# Patient Record
Sex: Male | Born: 1955 | Race: Black or African American | Hispanic: No | Smoking: Current every day smoker
Health system: Southern US, Community
[De-identification: ages and names within clinical notes are randomized; demographics above are authoritative.]

## PROBLEM LIST (undated history)

## (undated) DIAGNOSIS — Z86718 Personal history of other venous thrombosis and embolism: Secondary | ICD-10-CM

## (undated) DIAGNOSIS — C679 Malignant neoplasm of bladder, unspecified: Secondary | ICD-10-CM

## (undated) DIAGNOSIS — I219 Acute myocardial infarction, unspecified: Secondary | ICD-10-CM

## (undated) DIAGNOSIS — I2510 Atherosclerotic heart disease of native coronary artery without angina pectoris: Secondary | ICD-10-CM

## (undated) DIAGNOSIS — K631 Perforation of intestine (nontraumatic): Secondary | ICD-10-CM

## (undated) DIAGNOSIS — N182 Chronic kidney disease, stage 2 (mild): Secondary | ICD-10-CM

## (undated) DIAGNOSIS — E119 Type 2 diabetes mellitus without complications: Secondary | ICD-10-CM

## (undated) DIAGNOSIS — D509 Iron deficiency anemia, unspecified: Secondary | ICD-10-CM

## (undated) DIAGNOSIS — F1910 Other psychoactive substance abuse, uncomplicated: Secondary | ICD-10-CM

## (undated) DIAGNOSIS — I5020 Unspecified systolic (congestive) heart failure: Secondary | ICD-10-CM

## (undated) DIAGNOSIS — I255 Ischemic cardiomyopathy: Secondary | ICD-10-CM

## (undated) DIAGNOSIS — E785 Hyperlipidemia, unspecified: Secondary | ICD-10-CM

## (undated) DIAGNOSIS — I509 Heart failure, unspecified: Secondary | ICD-10-CM

## (undated) DIAGNOSIS — I10 Essential (primary) hypertension: Secondary | ICD-10-CM

## (undated) DIAGNOSIS — I2699 Other pulmonary embolism without acute cor pulmonale: Secondary | ICD-10-CM

## (undated) HISTORY — DX: Personal history of other venous thrombosis and embolism: Z86.718

## (undated) HISTORY — DX: Hyperlipidemia, unspecified: E78.5

## (undated) HISTORY — DX: Type 2 diabetes mellitus without complications: E11.9

## (undated) HISTORY — DX: Iron deficiency anemia, unspecified: D50.9

## (undated) HISTORY — DX: Chronic kidney disease, stage 2 (mild): N18.2

## (undated) HISTORY — DX: Atherosclerotic heart disease of native coronary artery without angina pectoris: I25.10

## (undated) HISTORY — DX: Malignant neoplasm of bladder, unspecified: C67.9

## (undated) HISTORY — PX: BACK SURGERY: SHX140

## (undated) HISTORY — DX: Heart failure, unspecified: I50.9

## (undated) HISTORY — DX: Perforation of intestine (nontraumatic): K63.1

## (undated) HISTORY — DX: Ischemic cardiomyopathy: I25.5

## (undated) HISTORY — DX: Unspecified systolic (congestive) heart failure: I50.20

## (undated) HISTORY — PX: OTHER SURGICAL HISTORY: SHX169

---

## 2004-05-15 ENCOUNTER — Ambulatory Visit (HOSPITAL_COMMUNITY): Admission: RE | Admit: 2004-05-15 | Discharge: 2004-05-15 | Payer: Self-pay | Admitting: Neurosurgery

## 2015-09-05 ENCOUNTER — Encounter: Payer: Self-pay | Admitting: Emergency Medicine

## 2015-09-05 ENCOUNTER — Encounter
Admission: EM | Disposition: A | Discharge status: Home / Self Care | Payer: Self-pay | Source: Home / Self Care | Attending: Internal Medicine

## 2015-09-05 ENCOUNTER — Emergency Department: Payer: Self-pay

## 2015-09-05 ENCOUNTER — Encounter: Payer: Self-pay | Admitting: Certified Registered Nurse Anesthetist

## 2015-09-05 ENCOUNTER — Inpatient Hospital Stay (HOSPITAL_COMMUNITY)
Admit: 2015-09-05 | Discharge: 2015-09-05 | Disposition: A | Discharge status: Home / Self Care | Payer: Self-pay | Attending: Internal Medicine | Admitting: Internal Medicine

## 2015-09-05 ENCOUNTER — Inpatient Hospital Stay
Admission: EM | Admit: 2015-09-05 | Discharge: 2015-09-08 | DRG: 252 | Disposition: A | Discharge status: Home / Self Care | Payer: Self-pay | Attending: Specialist | Admitting: Specialist

## 2015-09-05 DIAGNOSIS — I214 Non-ST elevation (NSTEMI) myocardial infarction: Principal | ICD-10-CM | POA: Diagnosis present

## 2015-09-05 DIAGNOSIS — R634 Abnormal weight loss: Secondary | ICD-10-CM | POA: Insufficient documentation

## 2015-09-05 DIAGNOSIS — E1159 Type 2 diabetes mellitus with other circulatory complications: Secondary | ICD-10-CM | POA: Diagnosis present

## 2015-09-05 DIAGNOSIS — Z8249 Family history of ischemic heart disease and other diseases of the circulatory system: Secondary | ICD-10-CM

## 2015-09-05 DIAGNOSIS — E1165 Type 2 diabetes mellitus with hyperglycemia: Secondary | ICD-10-CM | POA: Diagnosis present

## 2015-09-05 DIAGNOSIS — I209 Angina pectoris, unspecified: Secondary | ICD-10-CM

## 2015-09-05 DIAGNOSIS — I82411 Acute embolism and thrombosis of right femoral vein: Secondary | ICD-10-CM | POA: Diagnosis present

## 2015-09-05 DIAGNOSIS — I259 Chronic ischemic heart disease, unspecified: Secondary | ICD-10-CM | POA: Diagnosis present

## 2015-09-05 DIAGNOSIS — I10 Essential (primary) hypertension: Secondary | ICD-10-CM | POA: Diagnosis present

## 2015-09-05 DIAGNOSIS — D509 Iron deficiency anemia, unspecified: Secondary | ICD-10-CM

## 2015-09-05 DIAGNOSIS — F17210 Nicotine dependence, cigarettes, uncomplicated: Secondary | ICD-10-CM | POA: Diagnosis present

## 2015-09-05 DIAGNOSIS — D494 Neoplasm of unspecified behavior of bladder: Secondary | ICD-10-CM | POA: Diagnosis present

## 2015-09-05 DIAGNOSIS — I2699 Other pulmonary embolism without acute cor pulmonale: Secondary | ICD-10-CM | POA: Diagnosis present

## 2015-09-05 DIAGNOSIS — IMO0002 Reserved for concepts with insufficient information to code with codable children: Secondary | ICD-10-CM | POA: Insufficient documentation

## 2015-09-05 DIAGNOSIS — E785 Hyperlipidemia, unspecified: Secondary | ICD-10-CM | POA: Diagnosis present

## 2015-09-05 DIAGNOSIS — R3129 Other microscopic hematuria: Secondary | ICD-10-CM | POA: Diagnosis present

## 2015-09-05 DIAGNOSIS — I8290 Acute embolism and thrombosis of unspecified vein: Secondary | ICD-10-CM

## 2015-09-05 DIAGNOSIS — D500 Iron deficiency anemia secondary to blood loss (chronic): Secondary | ICD-10-CM | POA: Diagnosis present

## 2015-09-05 DIAGNOSIS — R079 Chest pain, unspecified: Secondary | ICD-10-CM

## 2015-09-05 DIAGNOSIS — D649 Anemia, unspecified: Secondary | ICD-10-CM | POA: Insufficient documentation

## 2015-09-05 HISTORY — PX: CARDIAC CATHETERIZATION: SHX172

## 2015-09-05 HISTORY — DX: Other psychoactive substance abuse, uncomplicated: F19.10

## 2015-09-05 HISTORY — DX: Essential (primary) hypertension: I10

## 2015-09-05 HISTORY — DX: Type 2 diabetes mellitus without complications: E11.9

## 2015-09-05 LAB — CBC
HCT: 17.4 % — ABNORMAL LOW (ref 40.0–52.0)
HCT: 27.6 % — ABNORMAL LOW (ref 40.0–52.0)
HEMOGLOBIN: 8.1 g/dL — AB (ref 13.0–18.0)
Hemoglobin: 4.9 g/dL — CL (ref 13.0–18.0)
MCH: 19.1 pg — ABNORMAL LOW (ref 26.0–34.0)
MCH: 20.9 pg — AB (ref 26.0–34.0)
MCHC: 28.3 g/dL — ABNORMAL LOW (ref 32.0–36.0)
MCHC: 29.4 g/dL — AB (ref 32.0–36.0)
MCV: 67.5 fL — ABNORMAL LOW (ref 80.0–100.0)
MCV: 71 fL — ABNORMAL LOW (ref 80.0–100.0)
PLATELETS: 294 10*3/uL (ref 150–440)
Platelets: 261 10*3/uL (ref 150–440)
RBC: 2.58 MIL/uL — ABNORMAL LOW (ref 4.40–5.90)
RBC: 3.88 MIL/uL — AB (ref 4.40–5.90)
RDW: 25 % — ABNORMAL HIGH (ref 11.5–14.5)
RDW: 25.6 % — ABNORMAL HIGH (ref 11.5–14.5)
WBC: 13.3 10*3/uL — ABNORMAL HIGH (ref 3.8–10.6)
WBC: 17.3 10*3/uL — ABNORMAL HIGH (ref 3.8–10.6)

## 2015-09-05 LAB — URINALYSIS COMPLETE WITH MICROSCOPIC (ARMC ONLY)
BACTERIA UA: NONE SEEN
Bilirubin Urine: NEGATIVE
Glucose, UA: NEGATIVE mg/dL
Ketones, ur: NEGATIVE mg/dL
LEUKOCYTES UA: NEGATIVE
Nitrite: POSITIVE — AB
PH: 9 — AB (ref 5.0–8.0)
PROTEIN: 100 mg/dL — AB
SQUAMOUS EPITHELIAL / LPF: NONE SEEN
Specific Gravity, Urine: 1.031 — ABNORMAL HIGH (ref 1.005–1.030)

## 2015-09-05 LAB — IRON AND TIBC
IRON: 78 ug/dL (ref 45–182)
Iron: 48 ug/dL (ref 45–182)
SATURATION RATIOS: 9 % — AB (ref 17.9–39.5)
Saturation Ratios: 13 % — ABNORMAL LOW (ref 17.9–39.5)
TIBC: 511 ug/dL — ABNORMAL HIGH (ref 250–450)
TIBC: 581 ug/dL — AB (ref 250–450)
UIBC: 463 ug/dL
UIBC: 503 ug/dL

## 2015-09-05 LAB — PROTIME-INR
INR: 1.3
Prothrombin Time: 16.3 seconds — ABNORMAL HIGH (ref 11.4–15.0)

## 2015-09-05 LAB — CBC WITH DIFFERENTIAL/PLATELET
Basophils Absolute: 0.1 10*3/uL (ref 0–0.1)
EOS ABS: 0.1 10*3/uL (ref 0–0.7)
HCT: 23.5 % — ABNORMAL LOW (ref 40.0–52.0)
Hemoglobin: 6.9 g/dL — ABNORMAL LOW (ref 13.0–18.0)
Lymphocytes Relative: 9 %
Lymphs Abs: 1.2 10*3/uL (ref 1.0–3.6)
MCH: 20.7 pg — ABNORMAL LOW (ref 26.0–34.0)
MCHC: 29.3 g/dL — AB (ref 32.0–36.0)
MCV: 70.8 fL — ABNORMAL LOW (ref 80.0–100.0)
MONO ABS: 1.7 10*3/uL — AB (ref 0.2–1.0)
Neutro Abs: 10.7 10*3/uL — ABNORMAL HIGH (ref 1.4–6.5)
Neutrophils Relative %: 79 %
Platelets: 253 10*3/uL (ref 150–440)
RBC: 3.33 MIL/uL — ABNORMAL LOW (ref 4.40–5.90)
RDW: 26.9 % — AB (ref 11.5–14.5)
WBC: 13.7 10*3/uL — ABNORMAL HIGH (ref 3.8–10.6)

## 2015-09-05 LAB — TROPONIN I
Troponin I: 20.13 ng/mL — ABNORMAL HIGH (ref <0.031)
Troponin I: 20.31 ng/mL — ABNORMAL HIGH (ref <0.031)
Troponin I: 22.51 ng/mL — ABNORMAL HIGH (ref <0.031)
Troponin I: 16.1 ng/mL — ABNORMAL HIGH (ref <0.031)

## 2015-09-05 LAB — GLUCOSE, CAPILLARY: GLUCOSE-CAPILLARY: 169 mg/dL — AB (ref 65–99)

## 2015-09-05 LAB — RETICULOCYTES
RBC.: 3.33 MIL/uL — AB (ref 4.40–5.90)
RETIC CT PCT: 2.4 % (ref 0.4–3.1)
Retic Count, Absolute: 79.9 10*3/uL (ref 19.0–183.0)

## 2015-09-05 LAB — LIPID PANEL
Cholesterol: 107 mg/dL (ref 0–200)
HDL: 25 mg/dL — ABNORMAL LOW (ref >40)
LDL CALC: 60 mg/dL (ref 0–99)
Total CHOL/HDL Ratio: 4.3 RATIO
Triglycerides: 111 mg/dL (ref <150)
VLDL: 22 mg/dL (ref 0–40)

## 2015-09-05 LAB — COMPREHENSIVE METABOLIC PANEL
ALT: 24 U/L (ref 17–63)
AST: 176 U/L — ABNORMAL HIGH (ref 15–41)
Albumin: 3.4 g/dL — ABNORMAL LOW (ref 3.5–5.0)
Alkaline Phosphatase: 70 U/L (ref 38–126)
Anion gap: 9 (ref 5–15)
BUN: 14 mg/dL (ref 6–20)
CO2: 23 mmol/L (ref 22–32)
Calcium: 8.4 mg/dL — ABNORMAL LOW (ref 8.9–10.3)
Chloride: 102 mmol/L (ref 101–111)
Creatinine, Ser: 1.13 mg/dL (ref 0.61–1.24)
GFR calc Af Amer: >60 mL/min (ref >60)
GFR calc non Af Amer: >60 mL/min (ref >60)
Glucose, Bld: 193 mg/dL — ABNORMAL HIGH (ref 65–99)
Potassium: 3.8 mmol/L (ref 3.5–5.1)
Sodium: 134 mmol/L — ABNORMAL LOW (ref 135–145)
Total Bilirubin: 1 mg/dL (ref 0.3–1.2)
Total Protein: 7.3 g/dL (ref 6.5–8.1)

## 2015-09-05 LAB — LACTATE DEHYDROGENASE: LDH: 667 U/L — ABNORMAL HIGH (ref 98–192)

## 2015-09-05 LAB — ABO/RH: ABO/RH(D): O POS

## 2015-09-05 LAB — HEPARIN LEVEL (UNFRACTIONATED)

## 2015-09-05 LAB — APTT: aPTT: 33 seconds (ref 24–36)

## 2015-09-05 LAB — FERRITIN: Ferritin: 5 ng/mL — ABNORMAL LOW (ref 24–336)

## 2015-09-05 LAB — VITAMIN B12: VITAMIN B 12: 2281 pg/mL — AB (ref 180–914)

## 2015-09-05 LAB — FOLATE: Folate: 14.9 ng/mL (ref >5.9)

## 2015-09-05 LAB — BRAIN NATRIURETIC PEPTIDE: B Natriuretic Peptide: 958 pg/mL — ABNORMAL HIGH (ref 0.0–100.0)

## 2015-09-05 LAB — PREPARE RBC (CROSSMATCH)

## 2015-09-05 SURGERY — LEFT HEART CATH AND CORONARY ANGIOGRAPHY
Anesthesia: Moderate Sedation

## 2015-09-05 MED ORDER — SODIUM CHLORIDE 0.9% FLUSH
3.0000 mL | Freq: Two times a day (BID) | INTRAVENOUS | Status: DC
  Administered 2015-09-05 – 2015-09-08 (×5): 3 mL via INTRAVENOUS

## 2015-09-05 MED ORDER — SODIUM CHLORIDE 0.9 % IV SOLN
10.0000 mL/h | Freq: Once | INTRAVENOUS | Status: AC
  Administered 2015-09-05: 10 mL/h via INTRAVENOUS

## 2015-09-05 MED ORDER — SODIUM CHLORIDE 0.9 % IV SOLN
INTRAVENOUS | Status: DC
  Administered 2015-09-06: 07:00:00 via INTRAVENOUS

## 2015-09-05 MED ORDER — ACETAMINOPHEN 325 MG PO TABS: 650.0000 mg | ORAL_TABLET | ORAL | Status: DC | PRN

## 2015-09-05 MED ORDER — HEPARIN (PORCINE) IN NACL 100-0.45 UNIT/ML-% IJ SOLN
1200.0000 [IU]/h | INTRAMUSCULAR | Status: DC
  Administered 2015-09-05: 1200 [IU]/h via INTRAVENOUS
  Filled 2015-09-05: qty 250

## 2015-09-05 MED ORDER — HEPARIN (PORCINE) IN NACL 100-0.45 UNIT/ML-% IJ SOLN
12.0000 [IU]/kg/h | Freq: Once | INTRAMUSCULAR | Status: DC
  Filled 2015-09-05: qty 250

## 2015-09-05 MED ORDER — CARVEDILOL 3.125 MG PO TABS
3.1250 mg | ORAL_TABLET | Freq: Two times a day (BID) | ORAL | Status: DC
  Administered 2015-09-05 – 2015-09-08 (×7): 3.125 mg via ORAL
  Filled 2015-09-05 (×7): qty 1

## 2015-09-05 MED ORDER — ACETAMINOPHEN 650 MG RE SUPP: 650.0000 mg | Freq: Four times a day (QID) | RECTAL | Status: DC | PRN

## 2015-09-05 MED ORDER — MIDAZOLAM HCL 2 MG/2ML IJ SOLN
INTRAMUSCULAR | Status: DC | PRN
  Administered 2015-09-05 (×2): 1 mg via INTRAVENOUS

## 2015-09-05 MED ORDER — HEPARIN SODIUM (PORCINE) 5000 UNIT/ML IJ SOLN: 4000.0000 [IU] | Freq: Once | INTRAMUSCULAR | Status: DC

## 2015-09-05 MED ORDER — NITROGLYCERIN 0.4 MG SL SUBL
0.4000 mg | SUBLINGUAL_TABLET | Freq: Once | SUBLINGUAL | Status: AC
  Administered 2015-09-05: 0.4 mg via SUBLINGUAL
  Filled 2015-09-05: qty 1

## 2015-09-05 MED ORDER — MORPHINE SULFATE (PF) 2 MG/ML IV SOLN
1.0000 mg | INTRAVENOUS | Status: DC | PRN
  Administered 2015-09-05: 1 mg via INTRAVENOUS
  Filled 2015-09-05: qty 1

## 2015-09-05 MED ORDER — SODIUM CHLORIDE 0.9 % WEIGHT BASED INFUSION
3.0000 mL/kg/h | INTRAVENOUS | Status: AC
  Administered 2015-09-05: 3 mL/kg/h via INTRAVENOUS

## 2015-09-05 MED ORDER — MIDAZOLAM HCL 2 MG/2ML IJ SOLN
INTRAMUSCULAR | Status: AC
  Filled 2015-09-05: qty 2

## 2015-09-05 MED ORDER — SODIUM CHLORIDE 0.9 % IV SOLN: 250.0000 mL | INTRAVENOUS | Status: DC | PRN

## 2015-09-05 MED ORDER — HEPARIN (PORCINE) IN NACL 2-0.9 UNIT/ML-% IJ SOLN
INTRAMUSCULAR | Status: AC
Start: 2015-09-05 — End: 2015-09-05
  Filled 2015-09-05: qty 1000

## 2015-09-05 MED ORDER — ASPIRIN 81 MG PO CHEW
324.0000 mg | CHEWABLE_TABLET | Freq: Once | ORAL | Status: AC
  Administered 2015-09-05: 324 mg via ORAL
  Filled 2015-09-05: qty 4

## 2015-09-05 MED ORDER — SODIUM CHLORIDE 0.9% FLUSH: 3.0000 mL | Freq: Two times a day (BID) | INTRAVENOUS | Status: DC

## 2015-09-05 MED ORDER — ACETAMINOPHEN 325 MG PO TABS: 650.0000 mg | ORAL_TABLET | Freq: Four times a day (QID) | ORAL | Status: DC | PRN

## 2015-09-05 MED ORDER — ASPIRIN 81 MG PO CHEW: 81.0000 mg | CHEWABLE_TABLET | ORAL | Status: DC

## 2015-09-05 MED ORDER — FENTANYL CITRATE (PF) 100 MCG/2ML IJ SOLN
INTRAMUSCULAR | Status: DC | PRN
  Administered 2015-09-05: 25 ug via INTRAVENOUS

## 2015-09-05 MED ORDER — SODIUM CHLORIDE 0.9 % WEIGHT BASED INFUSION: 1.0000 mL/kg/h | INTRAVENOUS | Status: DC

## 2015-09-05 MED ORDER — ATORVASTATIN CALCIUM 20 MG PO TABS
40.0000 mg | ORAL_TABLET | Freq: Every day | ORAL | Status: DC
  Administered 2015-09-05 – 2015-09-08 (×4): 40 mg via ORAL
  Filled 2015-09-05 (×2): qty 2
  Filled 2015-09-05 (×2): qty 1
  Filled 2015-09-05: qty 2

## 2015-09-05 MED ORDER — ASPIRIN 81 MG PO CHEW
81.0000 mg | CHEWABLE_TABLET | Freq: Every day | ORAL | Status: DC
  Administered 2015-09-05 – 2015-09-06 (×2): 81 mg via ORAL
  Filled 2015-09-05 (×2): qty 1

## 2015-09-05 MED ORDER — IOHEXOL 300 MG/ML  SOLN
INTRAMUSCULAR | Status: DC | PRN
  Administered 2015-09-05: 120 mL via INTRA_ARTERIAL

## 2015-09-05 MED ORDER — SODIUM CHLORIDE 0.9 % WEIGHT BASED INFUSION: 3.0000 mL/kg/h | INTRAVENOUS | Status: DC

## 2015-09-05 MED ORDER — TICAGRELOR 90 MG PO TABS
90.0000 mg | ORAL_TABLET | Freq: Two times a day (BID) | ORAL | Status: DC
  Administered 2015-09-05 – 2015-09-06 (×2): 90 mg via ORAL
  Filled 2015-09-05 (×2): qty 1

## 2015-09-05 MED ORDER — ONDANSETRON HCL 4 MG/2ML IJ SOLN: 4.0000 mg | Freq: Four times a day (QID) | INTRAMUSCULAR | Status: DC | PRN

## 2015-09-05 MED ORDER — FENTANYL CITRATE (PF) 100 MCG/2ML IJ SOLN
INTRAMUSCULAR | Status: AC
  Filled 2015-09-05: qty 2

## 2015-09-05 MED ORDER — SODIUM CHLORIDE 0.9% FLUSH: 3.0000 mL | INTRAVENOUS | Status: DC | PRN

## 2015-09-05 SURGICAL SUPPLY — 11 items
CATH INFINITI 5FR ANG PIGTAIL (CATHETERS) ×2 IMPLANT
CATH INFINITI 5FR JL4 (CATHETERS) ×2 IMPLANT
CATH INFINITI JR4 5F (CATHETERS) ×2 IMPLANT
DEVICE CLOSURE MYNXGRIP 5F (Vascular Products) ×1 IMPLANT
KIT MANI 3VAL PERCEP (MISCELLANEOUS) ×2 IMPLANT
NDL PERC 18GX7CM (NEEDLE) ×1 IMPLANT
NEEDLE PERC 18GX7CM (NEEDLE) ×2 IMPLANT
NEEDLE SMART 18G ACCESS (NEEDLE) IMPLANT
PACK CARDIAC CATH (CUSTOM PROCEDURE TRAY) ×2 IMPLANT
SHEATH AVANTI 5FR X 11CM (SHEATH) ×2 IMPLANT
WIRE EMERALD 3MM-J .035X150CM (WIRE) ×2 IMPLANT

## 2015-09-05 NOTE — Progress Notes (Signed)
ANTICOAGULATION CONSULT NOTE - Initial Consult  Pharmacy Consult for heparin drip Indication: chest pain/ACS  No Known Allergies  Patient Measurements: Height: 6\' 2"  (188 cm) Weight: 220 lb 10.9 oz (100.1 kg) IBW/kg (Calculated) : 82.2 Heparin Dosing Weight: 100.1 kg  Vital Signs: Temp: 99 F (37.2 C) (03/22 1046) Temp Source: Oral (03/22 1046) BP: 130/75 mmHg (03/22 1300) Pulse Rate: 88 (03/22 1300)  Labs:  Recent Labs  09/05/15 0735 09/05/15 1153  HGB 4.9* 6.9*  HCT 17.4* 23.5*  PLT 261 253  APTT 33  --   LABPROT 16.3*  --   INR 1.30  --   CREATININE 1.13  --   TROPONINI 20.13* 20.31*    Estimated Creatinine Clearance: 90.1 mL/min (by C-G formula based on Cr of 1.13).  Medical History: Past Medical History  Diagnosis Date  . Diabetes mellitus without complication ([place])     Not on medications  . Hypertension     Not on medications  . Polysubstance abuse     a. ongoing tobacco and alcohol abuse    Assessment: Pharmacy consulted to dose and monitor a heparin drip in this 60 year old male being admitted with NSTEMI. Patient had an initial Hgb of 4.9 with no signs or symptoms of bleeding. Patient received transfusion and Hgb is now 6.9. Plt 253. Per MD notes, Hgb being low is possibly due to chronic anemia. Patient has elevated troponin of 20.3.   Baseline labs were obtained in ED: Hgb 6.9 Plt 253  APTT 33 INR 1.30  Heparin dosing weight = 100.1 kg  Goal of Therapy:  Heparin level 0.3-0.7 units/ml Monitor platelets by anticoagulation protocol: Yes   Plan:  NO BOLUS per MD orders  Start heparin infusion at 1200 units/hr (12 units/kg/hr) - low end of range given low Hgb Check anti-Xa level in 6 hours and daily while on heparin. CBC ordered with AM labs. Continue to monitor H&H and platelets  Lenis Noon, PharmD Clinical Pharmacist 09/05/2015,2:01 PM

## 2015-09-05 NOTE — Progress Notes (Signed)
Cardiac catheterization report  Occluded OM 2 vessel in the proximal region Occluded PDA Both vessels filled via collaterals Also with moderate mid LAD disease  Case discussed with Dr. Fletcher Anon.  Given profound anemia, patient currently not having chest pain, event likely happened  2 days ago (that is when chest pain symptoms presented), troponins already peaked, also OM 2  relatively small to moderate size vessel, recommend medical management   Recommendations:  Recommended smoking cessation Medical management of his coronary disease Recommend aspirin 81 mg daily and brilinta 90 mg BID  Will discontinue heparin

## 2015-09-05 NOTE — ED Provider Notes (Addendum)
[facility] Emergency Department Provider Note     Time seen: ----------------------------------------- 7:30 AM on 09/05/2015 -----------------------------------------    I have reviewed the triage vital signs and the nursing notes.   HISTORY  Chief Complaint Chest Pain    HPI Frederick Perry is a 60 y.o. male who presents ER for sudden onset chest tightness that has persisted over the last 1-2 days. Patient states he thinks he got worse when he lays down, he states he did not sleep all night due to chest tightness. He denies any sweats, nausea, shortness of breath. Patient denies any recent illness, has never had a history of this before. Patient states it resolved spontaneously.   No past medical history on file.  There are no active problems to display for this patient.   No past surgical history on file.  Allergies Review of patient's allergies indicates not on file.  Social History Social History  Substance Use Topics  . Smoking status: Not on file  . Smokeless tobacco: Not on file  . Alcohol Use: Not on file    Review of Systems Constitutional: Negative for fever. Eyes: Negative for visual changes. ENT: Negative for sore throat. Cardiovascular: Positive for chest pain Respiratory: Negative for shortness of breath. Gastrointestinal: Negative for abdominal pain, vomiting and diarrhea. Genitourinary: Negative for dysuria. Musculoskeletal: Negative for back pain. Skin: Negative for rash. Neurological: Negative for headaches, focal weakness or numbness.  10-point ROS otherwise negative.  ____________________________________________   PHYSICAL EXAM:  VITAL SIGNS: ED Triage Vitals  Enc Vitals Group     BP --      Pulse --      Resp --      Temp --      Temp src --      SpO2 --      Weight --      Height --      Head Cir --      Peak Flow --      Pain Score --      Pain Loc --      Pain Edu? --      Excl. in [place]? --      Constitutional: Alert and oriented. Well appearing and in no distress. Eyes: Conjunctivae are Pale. PERRL. Normal extraocular movements. ENT   Head: Normocephalic and atraumatic.   Nose: No congestion/rhinnorhea.   Mouth/Throat: Mucous membranes are moist.   Neck: No stridor. Cardiovascular: Rapid rate, regular rhythm. Normal and symmetric distal pulses are present in all extremities. No murmurs, rubs, or gallops. Respiratory: Normal respiratory effort without tachypnea nor retractions. Breath sounds are clear and equal bilaterally. No wheezes/rales/rhonchi. Gastrointestinal: Soft and nontender. No distention. No abdominal bruits.  Rectal: Nontender, no hemorrhoids, heme-negative stool. Musculoskeletal: Nontender with normal range of motion in all extremities. No joint effusions. Bilateral pitting edema to the ankles, worse in the right ankle Neurologic:  Normal speech and language. No gross focal neurologic deficits are appreciated.  Skin:  Skin is warm, dry and intact. Pallor is noted Psychiatric: Mood and affect are normal. Speech and behavior are normal. Patient exhibits appropriate insight and judgment. ____________________________________________  EKG: Interpreted by me. Sinus tachycardia with a rate of 105 bpm, normal PR interval, normal QRS, normal QT interval. Anterior ST depression, normal axis.  ____________________________________________  ED COURSE:  Pertinent labs & imaging results that were available during my care of the patient were reviewed by me and considered in my medical decision making (see chart for details).  Patient is in no acute distress, will check cardiac labs, chest x-ray, given aspirin and reevaluate. ____________________________________________    LABS (pertinent positives/negatives)  Labs Reviewed  CBC - Abnormal; Notable for the following:    WBC 13.3 (*)    RBC 2.58 (*)    Hemoglobin 4.9 (*)    HCT 17.4 (*)    MCV 67.5 (*)     MCH 19.1 (*)    MCHC 28.3 (*)    RDW 25.0 (*)    All other components within normal limits  PROTIME-INR - Abnormal; Notable for the following:    Prothrombin Time 16.3 (*)    All other components within normal limits  BRAIN NATRIURETIC PEPTIDE - Abnormal; Notable for the following:    B Natriuretic Peptide 958.0 (*)    All other components within normal limits  TROPONIN I  COMPREHENSIVE METABOLIC PANEL  PREPARE RBC (CROSSMATCH)  TYPE AND SCREEN  ABO/RH   CRITICAL CARE Performed by: Earleen Newport   Total critical care time: 30 minutes  Critical care time was exclusive of separately billable procedures and treating other patients.  Critical care was necessary to treat or prevent imminent or life-threatening deterioration.  Critical care was time spent personally by me on the following activities: development of treatment plan with patient and/or surrogate as well as nursing, discussions with consultants, evaluation of patient's response to treatment, examination of patient, obtaining history from patient or surrogate, ordering and performing treatments and interventions, ordering and review of laboratory studies, ordering and review of radiographic studies, pulse oximetry and re-evaluation of patient's condition.  RADIOLOGY Images were viewed by me  Chest x-ray Artifact overlies chest. Heart size is at the upper limits of normal. Mediastinal shadows are normal. The lungs are clear. The vascularity is normal. No effusions. No bony abnormalities.  IMPRESSION: No active disease. ____________________________________________  FINAL ASSESSMENT AND PLAN  Chest pain, anemia, Elevated troponin, NSTEMI  Plan: Patient with labs and imaging as dictated above. Patient with chest pain likely secondary to profound anemia. Anemia is of uncertain etiology at this time. Patient will require a blood transfusion.  Patient had a prolonged ER stay due to lab machine errors and inability  to get his blood work back. Troponin found to be 20. He was started on heparin, we will infuse blood more quickly. Cardiology will be consult. Earleen Newport, MD   Earleen Newport, MD 09/05/15 [place], MD 09/05/15 [phone]

## 2015-09-05 NOTE — Progress Notes (Signed)
ANTICOAGULATION CONSULT NOTE - Initial Consult  Pharmacy Consult for heparin drip Indication: chest pain/ACS  No Known Allergies  Patient Measurements: Height: 6\' 2"  (188 cm) Weight: 220 lb 10.9 oz (100.1 kg) IBW/kg (Calculated) : 82.2 Heparin Dosing Weight: 100.1 kg  Vital Signs: Temp: 99 F (37.2 C) (03/22 1503) Temp Source: Oral (03/22 1503) BP: 135/81 mmHg (03/22 1725) Pulse Rate: 93 (03/22 1725)  Labs:  Recent Labs  09/05/15 0735 09/05/15 1153 09/05/15 1730 09/05/15 1744  HGB 4.9* 6.9*  --  8.1*  HCT 17.4* 23.5*  --  27.6*  PLT 261 253  --  294  APTT 33  --   --   --   LABPROT 16.3*  --   --   --   INR 1.30  --   --   --   CREATININE 1.13  --   --   --   TROPONINI 20.13* 20.31* 22.51*  --     Estimated Creatinine Clearance: 90.1 mL/min (by C-G formula based on Cr of 1.13).  Medical History: Past Medical History  Diagnosis Date  . Diabetes mellitus without complication ([place])     Not on medications  . Hypertension     Not on medications  . Polysubstance abuse     a. ongoing tobacco and alcohol abuse    Assessment: Pharmacy consulted to dose and monitor a heparin drip in this 60 year old male being admitted with NSTEMI. Patient had an initial Hgb of 4.9 with no signs or symptoms of bleeding. Patient received transfusion and Hgb is now 6.9. Plt 253. Per MD notes, Hgb being low is possibly due to chronic anemia. Patient has elevated troponin of 20.3.   Baseline labs were obtained in ED: Hgb 6.9 Plt 253  APTT 33 INR 1.30  Heparin dosing weight = 100.1 kg  Goal of Therapy:  Heparin level 0.3-0.7 units/ml Monitor platelets by anticoagulation protocol: Yes   Plan:  NO BOLUS per MD orders  Start heparin infusion at 1200 units/hr (12 units/kg/hr) - low end of range given low Hgb Check anti-Xa level in 6 hours and daily while on heparin. CBC ordered with AM labs. Continue to monitor H&H and platelets  3/22:   Heparin drip d/c'd per Dr Candis Musa.   Will d/c  heparin consult and associated orders.   Orene Desanctis, PharmD Clinical Pharmacist 09/05/2015,9:39 PM

## 2015-09-05 NOTE — Progress Notes (Signed)
Per report from [facility] RN patient has been having red tinged urine she stated that she had not notified the physician. Dr. Burlene Arnt notified. No orders received.

## 2015-09-05 NOTE — ED Notes (Signed)
Patient transported to special procedures.

## 2015-09-05 NOTE — Progress Notes (Signed)
Patient in room from specials post Heart cath. Right groin site WNL no signs of bleeding or hematoma noted. Patient may get up at 6 pm. Tele box on and verified with Gerald Stabs NT. VS. WNL no complaints of pain at this time. Will continue to monitor.

## 2015-09-05 NOTE — Consult Note (Signed)
Cardiology Consultation Note  Patient ID: Frederick Perry, MRN: [id number], DOB/AGE: Aug 22, 1955 60 y.o. Admit date: 09/05/2015   Date of Consult: 09/05/2015 Primary Physician: No primary care provider on file. Primary Cardiologist: New to [facility]  Chief Complaint: Chest pain Reason for Consult: Elevated troponin  HPI: 60 y.o. male with h/o DM2 x 1 year, HTN, ongoing tobacco abuse x 30 years at 0.5 to 1 ppd and alcohol abuse who presented to [facility] with chest pain and was found to have a troponin of 20 and hgb of 4.9.   He has no previously known cardiac history and has never seen a cardiologist before. No prior stress tests or cardiac catheterizations. He was initially diagnosed with DM2 approximately 1 year ago. At that time he changed his lifestyle and began eating a healthier diet and he has lost 30 pounds over the past 12 months. He last had a colonoscopy 10 years ago which he reports being normal. He denies any BRBPR or melena. He also denies any hematemesis. He does report intermittent hematuria that has been pink tinged. 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   He began to develop substernal chest discomfort, not characterized as pain on 3/21. He associated this as reflux and was taking TUMs around the clock. At times the pain would radiate to his left lateral chest wall. There has been some associated nausea. No associated diaphoresis or vomiting. On 3/21 he also noted increased bilateral lower extremity swelling which was new for him. He sleeps with 2 pillows at baseline with the head of the bed slightly elevated and has done so for many years. He also has never been able to finish his meals since he was little.   Upon the patient's arrival to [facility] he was found to have a troponin of 20.13, hgb of 4.9, hct 14.7, WBC 13.3, plt 261, AST 176, BNP 958. ECG as below, CXR showed no active disease. He received 1 unit of pRBC. He is  currently without chest pain.   Past Medical History  Diagnosis Date  . Diabetes mellitus without complication ([place])     Not on medications  . Hypertension     Not on medications  . Polysubstance abuse     a. ongoing tobacco and alcohol abuse       Most Recent Cardiac Studies: None.    Surgical History:  Past Surgical History  Procedure Laterality Date  . Back surgery    . Patellar tendon repair    . Testicular torsion repair       Home Meds: Prior to Admission medications   Medication Sig Start Date End Date Taking? Authorizing Provider  aspirin EC 81 MG tablet Take 81 mg by mouth daily as needed.   Yes Historical Provider, MD    Inpatient Medications:  . heparin  12 Units/kg/hr Intravenous Once  . heparin  4,000 Units Intravenous Once      Allergies: No Known Allergies  Social History   Social History  . Marital Status: Divorced    Spouse Name: N/A  . Number of Children: N/A  . Years of Education: N/A   Occupational History  . Not on file.   Social History Main Topics  . Smoking status: Current Every Day Smoker -- 1.00 packs/day    Types: Cigarettes  . Smokeless tobacco: Not on file  . Alcohol Use: 12.6 oz/week    21 Cans of  beer per week     Comment: daily- beers and occasionally liquor  . Drug Use: No  . Sexual Activity: Not on file   Other Topics Concern  . Not on file   Social History Narrative   Lives at home with Mother and is his Mothers primary caregiver     Family History  Problem Relation Age of Onset  . Congestive Heart Failure Mother   . Peripheral vascular disease Father      Review of Systems: Review of Systems  Constitutional: Positive for weight loss and malaise/fatigue. Negative for fever, chills and diaphoresis.  HENT: Negative for congestion.   Eyes: Negative for discharge and redness.  Respiratory: Negative for cough, hemoptysis, sputum production, shortness of breath and wheezing.   Cardiovascular: Positive for chest  pain and leg swelling. Negative for palpitations, orthopnea, claudication and PND.  Gastrointestinal: Positive for heartburn and nausea. Negative for vomiting, abdominal pain, diarrhea, constipation, blood in stool and melena.  Genitourinary: Positive for hematuria. Negative for dysuria, urgency, frequency and flank pain.  Musculoskeletal: Negative for myalgias, back pain, joint pain, falls and neck pain.  Skin: Negative for rash.  Neurological: Positive for dizziness and weakness. Negative for tingling, tremors, sensory change, speech change, focal weakness, seizures and loss of consciousness.  Endo/Heme/Allergies: Does not bruise/bleed easily.  Psychiatric/Behavioral: Positive for substance abuse. Negative for depression, suicidal ideas, hallucinations and memory loss. The patient is not nervous/anxious and does not have insomnia.   All other systems reviewed and are negative.    Labs:  Recent Labs  09/05/15 0735  TROPONINI 20.13*   Lab Results  Component Value Date   WBC 13.3* 09/05/2015   HGB 4.9* 09/05/2015   HCT 17.4* 09/05/2015   MCV 67.5* 09/05/2015   PLT 261 09/05/2015     Recent Labs Lab 09/05/15 0735  NA 134*  K 3.8  CL 102  CO2 23  BUN 14  CREATININE 1.13  CALCIUM 8.4*  PROT 7.3  BILITOT 1.0  ALKPHOS 70  ALT 24  AST 176*  GLUCOSE 193*   No results found for: CHOL, HDL, LDLCALC, TRIG No results found for: DDIMER  Radiology/Studies:  Dg Chest Port 1 View  09/05/2015  CLINICAL DATA:  Chest pain since yesterday. Swelling of the ankles. Hypertension. EXAM: PORTABLE CHEST 1 VIEW COMPARISON:  05/15/2004 FINDINGS: Artifact overlies chest. Heart size is at the upper limits of normal. Mediastinal shadows are normal. The lungs are clear. The vascularity is normal. No effusions. No bony abnormalities. IMPRESSION: No active disease. Electronically Signed   By: Nelson Chimes M.D.   On: 09/05/2015 07:53    EKG: sinus tachycardia, 105 bpm, PACs, lateral Q waves,  anterior and inferior horizontal st depression, subtle lateral st elevation, lateral TWI, cannot rule out posterior MI  Weights: Filed Weights   09/05/15 0732  Weight: 220 lb 10.9 oz (100.1 kg)     Physical Exam: Blood pressure 130/88, pulse 85, temperature 99 F (37.2 C), temperature source Oral, resp. rate 20, height 6\' 2"  (1.88 m), weight 220 lb 10.9 oz (100.1 kg), SpO2 100 %. Body mass index is 28.32 kg/(m^2). General: Well developed, well nourished, in no acute distress. Head: Normocephalic, atraumatic, sclera non-icteric, no xanthomas, nares are without discharge.  Neck: Negative for carotid bruits. JVD not elevated. Lungs: Clear bilaterally to auscultation without wheezes, rales, or rhonchi. Breathing is unlabored. Heart: RRR with S1 S2. No murmurs, rubs, or gallops appreciated. Abdomen: Soft, non-tender, non-distended with normoactive bowel sounds. No hepatomegaly. No  rebound/guarding. No obvious abdominal masses. Msk:  Strength and tone appear normal for age. Extremities: No clubbing or cyanosis. 1+ bilateral pitting edema to the thighs.  Distal pedal pulses are 2+ and equal bilaterally. Neuro: Alert and oriented X 3. No facial asymmetry. No focal deficit. Moves all extremities spontaneously. Psych:  Responds to questions appropriately with a normal affect.    Assessment and Plan:   1. NSTEMI/possible posterior MI: -Initial troponin of 20 in the setting of hgb 4.9 -Subsequent troponin level to trend are pending at this time -Cardiac catheterization today -Echo -Check lipid and A1C -Status post cardiac cath start Coreg and Lipitor  -Heparin gtt has been held at this time given his comfortable nature and pending further troponin levels. If further troponin level comes back increased would start heparin gtt. Bleeding risk has been discussed with the patient  -Risks and benefits of cardiac catheterization have been discussed with the patient including risks of bleeding,  bruising, infection, kidney damage, stroke, heart attack, and death. The patient understands these risks and is willing to proceed with the procedure. All questions have been answered and concerns listened to  2. Acute on possible chronic microcytic anemia: -Status post transfusion of 2 units pRBC to date -No obvious blood loss -Hemoccult negative -Some hematuria  -Hematology on board    3. DM2: -A1C -30 pound weight loss in 12 months -Hold metformin -SSI per IM  4. HTN: -Controlled  Signed, Christell Faith, PA-C Pager: [phone] 09/05/2015, 11:32 AM

## 2015-09-05 NOTE — H&P (Addendum)
Frederick Perry NAME: Frederick Perry    MR#:  [id number]  DATE OF BIRTH:  27-Nov-1955  DATE OF ADMISSION:  09/05/2015  PRIMARY CARE PHYSICIAN: No primary care provider on file.   REQUESTING/REFERRING PHYSICIAN: Dr. Lenise Arena  CHIEF COMPLAINT:   Chief Complaint  Patient presents with  . Chest Pain    HISTORY OF PRESENT ILLNESS:  Frederick Perry  is a 60 y.o. male with a known history of Hypertension and diabetes mellitus not taking any medications for more than a year now presents to the Hospital due to ongoing chest pain. Patient denies any prior cardiac history. Mom with history of congestive heart failure. Patient continues to smoke. Chest pain is dull- more waxing and waning for the last 2 days. More consistent since yesterday. No nausea, diaphoresis. Noted to have a Hb of 4.9 and troponin of 20.She appears very comfortable at this time. Second troponin is pending. He is receiving 2 units packed RBC transfusion now. Guaiac was negative, denies any melena or hematemesis or any other source of bleeding. Last blood work a few years ago and patient does not remember being told that he was anemic. MCV is low indicating that this probably was an chronic process. His blood pressure and heart rate are stable.  PAST MEDICAL HISTORY:   Past Medical History  Diagnosis Date  . Diabetes mellitus without complication ([place])     Not on medications  . Hypertension     Not on medications    PAST SURGICAL HISTORY:   Past Surgical History  Procedure Laterality Date  . Back surgery    . Patellar tendon repair    . Testicular torsion repair      SOCIAL HISTORY:   Social History  Substance Use Topics  . Smoking status: Current Every Day Smoker -- 1.00 packs/day    Types: Cigarettes  . Smokeless tobacco: Not on file  . Alcohol Use: 12.6 oz/week    21 Cans of beer per week     Comment: daily- beers and occasionally liquor     FAMILY HISTORY:   Family History  Problem Relation Age of Onset  . Congestive Heart Failure Mother   . Peripheral vascular disease Father     DRUG ALLERGIES:  No Known Allergies  REVIEW OF SYSTEMS:   Review of Systems  Constitutional: Positive for malaise/fatigue. Negative for fever, chills and weight loss.  HENT: Negative for ear discharge, ear pain, hearing loss, nosebleeds and tinnitus.   Eyes: Negative for blurred vision, double vision and photophobia.  Respiratory: Negative for cough, hemoptysis, shortness of breath and wheezing.   Cardiovascular: Positive for chest pain, orthopnea and leg swelling. Negative for palpitations.  Gastrointestinal: Negative for heartburn, nausea, vomiting, abdominal pain, diarrhea, constipation and melena.  Genitourinary: Negative for dysuria, urgency, frequency and hematuria.  Musculoskeletal: Negative for myalgias, back pain and neck pain.  Skin: Negative for rash.  Neurological: Negative for dizziness, tingling, tremors, sensory change, speech change, focal weakness and headaches.  Endo/Heme/Allergies: Does not bruise/bleed easily.  Psychiatric/Behavioral: Negative for depression.    MEDICATIONS AT HOME:   Prior to Admission medications   Medication Sig Start Date End Date Taking? Authorizing Provider  aspirin EC 81 MG tablet Take 81 mg by mouth daily as needed.   Yes Historical Provider, MD      VITAL SIGNS:  Blood pressure 121/76, pulse 92, temperature 99 F (37.2 C), temperature source Oral, resp. rate 14, height 6'  2" (1.88 m), weight 100.1 kg (220 lb 10.9 oz), SpO2 100 %.  PHYSICAL EXAMINATION:   Physical Exam  GENERAL:  60 y.o.-year-old patient lying in the bed with no acute distress.  EYES: Pupils equal, round, reactive to light and accommodation. No scleral icterus. Extraocular muscles intact. Pallor conjunctivae HEENT: Head atraumatic, normocephalic. Oropharynx and nasopharynx clear.  NECK:  Supple, no jugular venous  distention. No thyroid enlargement, no tenderness.  LUNGS: Normal breath sounds bilaterally, no wheezing, rales,rhonchi or crepitation. No use of accessory muscles of respiration.  CARDIOVASCULAR: S1, S2 normal. No murmurs, rubs, or gallops.  ABDOMEN: Soft, nontender, nondistended. Bowel sounds present. No organomegaly or mass.  Guaiac negative EXTREMITIES: No pedal edema, cyanosis, or clubbing.  NEUROLOGIC: Cranial nerves II through XII are intact. Muscle strength 5/5 in all extremities. Sensation intact. Gait not checked.  PSYCHIATRIC: The patient is alert and oriented x 3.  SKIN: No obvious rash, lesion, or ulcer.   LABORATORY PANEL:   CBC  Recent Labs Lab 09/05/15 0735  WBC 13.3*  HGB 4.9*  HCT 17.4*  PLT 261   ------------------------------------------------------------------------------------------------------------------  Chemistries   Recent Labs Lab 09/05/15 0735  NA 134*  K 3.8  CL 102  CO2 23  GLUCOSE 193*  BUN 14  CREATININE 1.13  CALCIUM 8.4*  AST 176*  ALT 24  ALKPHOS 70  BILITOT 1.0   ------------------------------------------------------------------------------------------------------------------  Cardiac Enzymes  Recent Labs Lab 09/05/15 0735  TROPONINI 20.13*   ------------------------------------------------------------------------------------------------------------------  RADIOLOGY:  Dg Chest Port 1 View  09/05/2015  CLINICAL DATA:  Chest pain since yesterday. Swelling of the ankles. Hypertension. EXAM: PORTABLE CHEST 1 VIEW COMPARISON:  05/15/2004 FINDINGS: Artifact overlies chest. Heart size is at the upper limits of normal. Mediastinal shadows are normal. The lungs are clear. The vascularity is normal. No effusions. No bony abnormalities. IMPRESSION: No active disease. Electronically Signed   By: Nelson Chimes M.D.   On: 09/05/2015 07:53    EKG:   Orders placed or performed during the hospital encounter of 09/05/15  . ED EKG  . ED  EKG  . EKG 12-Lead  . EKG 12-Lead    IMPRESSION AND PLAN:   Frederick Perry  is a 60 y.o. male with a known history of Hypertension and diabetes mellitus not taking any medications for more than a year now presents to the Hospital due to ongoing chest pain.  #1 acute on chronic anemia-unknown baseline. Hemoglobin of 4.9. MCV is low to low, possibly chronic anemia. -Stool for occult blood and anemia labs ordered. -Hematology consult. No active bleeding noted. Vitals are stable. -Being transfused with 2 units now. Recheck hemoglobin after transfusion. -Hemoglobin every 8 hours.  #2 NSTEMI-Patient does have some chest pain, appears very comfortable. Vitals are stable. EKG with ST depressions in lateral leads. -We'll hold off on heparin drip until second troponin is back. Cardiology has been consulted. -If second troponin is elevated, will need heparin and  possible cardiac catheterization today. -Low-dose aspirin started. Echocardiogram is ordered  #3 hypertension-blood pressure is within normal limits. Will not start on any antihypertensives with his anemia and borderline low BP now  #4 diabetes mellitus-A1c is pending. Monitor sugars. Hasn't been taking any medications for a while.  #5 DVT prophylaxis- For now on teds and SCDs    All the records are reviewed and case discussed with ED provider. Management plans discussed with the patient, family and they are in agreement.  CODE STATUS: FULL CODE   TOTAL TIME TAKING CARE OF  THIS PATIENT: 60 minutes.    Gladstone Lighter M.D on 09/05/2015 at 11:10 AM  Between 7am to 6pm - Pager - [phone]  After 6pm go to [email or web address] - password [email or web address]  [facility]  Office  [phone]  CC: Primary care physician; No primary care provider on file.

## 2015-09-05 NOTE — ED Notes (Signed)
Patient presents to the ED via [facility] with chest pressure that started yesterday.  Patient reports noticing swelling in his ankles, more in his right ankle. Patient denies history of heart attack previously.  Patient reports history of diabetes and hypertension.

## 2015-09-06 ENCOUNTER — Telehealth: Payer: Self-pay

## 2015-09-06 ENCOUNTER — Inpatient Hospital Stay: Payer: MEDICAID

## 2015-09-06 ENCOUNTER — Encounter: Payer: Self-pay | Admitting: Cardiovascular Disease

## 2015-09-06 DIAGNOSIS — D509 Iron deficiency anemia, unspecified: Secondary | ICD-10-CM

## 2015-09-06 DIAGNOSIS — Z7982 Long term (current) use of aspirin: Secondary | ICD-10-CM

## 2015-09-06 DIAGNOSIS — F17210 Nicotine dependence, cigarettes, uncomplicated: Secondary | ICD-10-CM

## 2015-09-06 DIAGNOSIS — E119 Type 2 diabetes mellitus without complications: Secondary | ICD-10-CM

## 2015-09-06 DIAGNOSIS — D649 Anemia, unspecified: Secondary | ICD-10-CM

## 2015-09-06 DIAGNOSIS — R634 Abnormal weight loss: Secondary | ICD-10-CM

## 2015-09-06 DIAGNOSIS — F1010 Alcohol abuse, uncomplicated: Secondary | ICD-10-CM

## 2015-09-06 DIAGNOSIS — I10 Essential (primary) hypertension: Secondary | ICD-10-CM

## 2015-09-06 DIAGNOSIS — D494 Neoplasm of unspecified behavior of bladder: Secondary | ICD-10-CM

## 2015-09-06 DIAGNOSIS — R311 Benign essential microscopic hematuria: Secondary | ICD-10-CM

## 2015-09-06 DIAGNOSIS — I2510 Atherosclerotic heart disease of native coronary artery without angina pectoris: Secondary | ICD-10-CM

## 2015-09-06 LAB — BASIC METABOLIC PANEL
Anion gap: 4 — ABNORMAL LOW (ref 5–15)
BUN: 13 mg/dL (ref 6–20)
CHLORIDE: 103 mmol/L (ref 101–111)
CO2: 24 mmol/L (ref 22–32)
CREATININE: 1.03 mg/dL (ref 0.61–1.24)
Calcium: 8.1 mg/dL — ABNORMAL LOW (ref 8.9–10.3)
Glucose, Bld: 147 mg/dL — ABNORMAL HIGH (ref 65–99)
POTASSIUM: 3.7 mmol/L (ref 3.5–5.1)
SODIUM: 131 mmol/L — AB (ref 135–145)

## 2015-09-06 LAB — CBC
HCT: 22.8 % — ABNORMAL LOW (ref 40.0–52.0)
Hemoglobin: 6.8 g/dL — ABNORMAL LOW (ref 13.0–18.0)
MCH: 20.9 pg — ABNORMAL LOW (ref 26.0–34.0)
MCHC: 30 g/dL — ABNORMAL LOW (ref 32.0–36.0)
MCV: 69.7 fL — AB (ref 80.0–100.0)
PLATELETS: 262 10*3/uL (ref 150–440)
RBC: 3.28 MIL/uL — AB (ref 4.40–5.90)
RDW: 25.5 % — AB (ref 11.5–14.5)
WBC: 17.6 10*3/uL — AB (ref 3.8–10.6)

## 2015-09-06 LAB — GLUCOSE, CAPILLARY
GLUCOSE-CAPILLARY: 165 mg/dL — AB (ref 65–99)
GLUCOSE-CAPILLARY: 165 mg/dL — AB (ref 65–99)
Glucose-Capillary: 131 mg/dL — ABNORMAL HIGH (ref 65–99)

## 2015-09-06 LAB — PROTIME-INR
INR: 1.36
Prothrombin Time: 16.9 seconds — ABNORMAL HIGH (ref 11.4–15.0)

## 2015-09-06 LAB — ECHOCARDIOGRAM COMPLETE
HEIGHTINCHES: 74 in
Weight: 3530.89 oz

## 2015-09-06 LAB — HAPTOGLOBIN: HAPTOGLOBIN: 341 mg/dL — AB (ref 34–200)

## 2015-09-06 LAB — VITAMIN B12: VITAMIN B 12: 2893 pg/mL — AB (ref 180–914)

## 2015-09-06 LAB — PREPARE RBC (CROSSMATCH)

## 2015-09-06 LAB — APTT: APTT: 38 s — AB (ref 24–36)

## 2015-09-06 LAB — HEPARIN LEVEL (UNFRACTIONATED): HEPARIN UNFRACTIONATED: 0.25 [IU]/mL — AB (ref 0.30–0.70)

## 2015-09-06 LAB — HEMOGLOBIN A1C: HEMOGLOBIN A1C: 7.4 % — AB (ref 4.0–6.0)

## 2015-09-06 MED ORDER — SODIUM CHLORIDE 0.9% FLUSH: 3.0000 mL | INTRAVENOUS | Status: DC | PRN

## 2015-09-06 MED ORDER — CHLORHEXIDINE GLUCONATE CLOTH 2 % EX PADS
6.0000 | MEDICATED_PAD | Freq: Once | CUTANEOUS | Status: AC
  Administered 2015-09-06: 6 via TOPICAL

## 2015-09-06 MED ORDER — DIPHENHYDRAMINE HCL 25 MG PO CAPS
25.0000 mg | ORAL_CAPSULE | Freq: Once | ORAL | Status: AC
  Administered 2015-09-06: 25 mg via ORAL
  Filled 2015-09-06: qty 1

## 2015-09-06 MED ORDER — IOHEXOL 240 MG/ML SOLN
25.0000 mL | INTRAMUSCULAR | Status: AC
  Administered 2015-09-06 (×2): 25 mL via ORAL

## 2015-09-06 MED ORDER — SODIUM CHLORIDE 0.9 % IV SOLN
Freq: Once | INTRAVENOUS | Status: AC
  Administered 2015-09-06: 10:00:00 via INTRAVENOUS

## 2015-09-06 MED ORDER — INSULIN ASPART 100 UNIT/ML ~~LOC~~ SOLN: 0.0000 [IU] | Freq: Every day | SUBCUTANEOUS | Status: DC

## 2015-09-06 MED ORDER — HEPARIN BOLUS VIA INFUSION
1500.0000 [IU] | Freq: Once | INTRAVENOUS | Status: AC
  Administered 2015-09-06: 1500 [IU] via INTRAVENOUS
  Filled 2015-09-06: qty 1500

## 2015-09-06 MED ORDER — INSULIN ASPART 100 UNIT/ML ~~LOC~~ SOLN
0.0000 [IU] | Freq: Three times a day (TID) | SUBCUTANEOUS | Status: DC
  Administered 2015-09-06 – 2015-09-07 (×3): 3 [IU] via SUBCUTANEOUS
  Administered 2015-09-08: 2 [IU] via SUBCUTANEOUS
  Administered 2015-09-08: 3 [IU] via SUBCUTANEOUS
  Filled 2015-09-06 (×4): qty 3

## 2015-09-06 MED ORDER — SODIUM CHLORIDE 0.9% FLUSH
10.0000 mL | INTRAVENOUS | Status: DC | PRN
Start: 2015-09-06 — End: 2015-09-08

## 2015-09-06 MED ORDER — SODIUM CHLORIDE 0.9 % IV SOLN
250.0000 mL | Freq: Once | INTRAVENOUS | Status: AC
  Administered 2015-09-06: 250 mL via INTRAVENOUS

## 2015-09-06 MED ORDER — ACETAMINOPHEN 325 MG PO TABS
650.0000 mg | ORAL_TABLET | Freq: Once | ORAL | Status: AC
  Administered 2015-09-06: 650 mg via ORAL
  Filled 2015-09-06: qty 2

## 2015-09-06 MED ORDER — HEPARIN (PORCINE) IN NACL 100-0.45 UNIT/ML-% IJ SOLN
1700.0000 [IU]/h | INTRAMUSCULAR | Status: DC
  Administered 2015-09-06: 1500 [IU]/h via INTRAVENOUS
  Filled 2015-09-06 (×4): qty 250

## 2015-09-06 MED ORDER — HEPARIN SOD (PORK) LOCK FLUSH 100 UNIT/ML IV SOLN
500.0000 [IU] | Freq: Every day | INTRAVENOUS | Status: DC | PRN
Start: 2015-09-06 — End: 2015-09-08
  Filled 2015-09-06: qty 5

## 2015-09-06 MED ORDER — IOPAMIDOL (ISOVUE-300) INJECTION 61%
100.0000 mL | Freq: Once | INTRAVENOUS | Status: AC | PRN
  Administered 2015-09-06: 100 mL via INTRAVENOUS

## 2015-09-06 MED ORDER — SODIUM CHLORIDE 0.9 % IV SOLN
INTRAVENOUS | Status: DC
  Administered 2015-09-06: 20:00:00 via INTRAVENOUS

## 2015-09-06 MED ORDER — HEPARIN SOD (PORK) LOCK FLUSH 100 UNIT/ML IV SOLN
250.0000 [IU] | INTRAVENOUS | Status: DC | PRN
  Filled 2015-09-06: qty 3

## 2015-09-06 NOTE — Progress Notes (Signed)
ANTICOAGULATION CONSULT NOTE - Initial Consult  Pharmacy Consult for Heparin Drip Indication: pulmonary embolus and DVT  No Known Allergies  Patient Measurements: Height: 6\' 2"  (188 cm) Weight: 220 lb 10.9 oz (100.1 kg) IBW/kg (Calculated) : 82.2 Heparin Dosing Weight: 100 kg  Vital Signs: Temp: 99 F (37.2 C) (03/23 1247) Temp Source: Oral (03/23 1247) BP: 113/59 mmHg (03/23 1247) Pulse Rate: 70 (03/23 1247)  Labs:  Recent Labs  09/05/15 0735 09/05/15 1153 09/05/15 1730 09/05/15 1744 09/05/15 2035 09/05/15 2307 09/06/15 0322  HGB 4.9* 6.9*  --  8.1*  --   --  6.8*  HCT 17.4* 23.5*  --  27.6*  --   --  22.8*  PLT 261 253  --  294  --   --  262  APTT 33  --   --   --   --   --   --   LABPROT 16.3*  --   --   --   --   --   --   INR 1.30  --   --   --   --   --   --   HEPARINUNFRC  --   --   --   --  <0.10*  --   --   CREATININE 1.13  --   --   --   --   --  1.03  TROPONINI 20.13* 20.31* 22.51*  --   --  16.10*  --     Estimated Creatinine Clearance: 98.9 mL/min (by C-G formula based on Cr of 1.03).   Medical History: Past Medical History  Diagnosis Date  . Diabetes mellitus without complication ([place])     Not on medications  . Hypertension     Not on medications  . Polysubstance abuse     a. ongoing tobacco and alcohol abuse     Medications:  Scheduled:  . atorvastatin  40 mg Oral q1800  . carvedilol  3.125 mg Oral BID WC  . insulin aspart  0-15 Units Subcutaneous TID WC  . insulin aspart  0-5 Units Subcutaneous QHS  . sodium chloride flush  3 mL Intravenous Q12H   Infusions:  . heparin 1,500 Units/hr (09/06/15 1503)    Assessment: Pharmacy consulted to initiate, titrate, and monitor heparin infusion in a 60 yo male admitted with NSTEMI.  Patient found today to have DVT and PE on CT.  Patient with hematuria and anemia.  Patient is status post pRBC transfusion (2 units).  MD would like to start heparin drip with no bolus for VTE treatment. Aspirin and  Brilinta therapy have been discontinued a this time.    APTT and INR ordered STAT  Goal of Therapy:  Heparin level 0.3-0.7 units/ml Monitor platelets by anticoagulation protocol: Yes   Plan:  Start heparin infusion at 1500 units/hr Check anti-Xa level in 6 hours and daily while on heparin Continue to monitor H&H and platelets  Silvia Hightower G 09/06/2015,3:19 PM

## 2015-09-06 NOTE — Consult Note (Signed)
[place] CONSULT NOTE  No care team member to display  CHIEF COMPLAINTS/PURPOSE OF CONSULTATION:  Anemia  HISTORY OF PRESENTING ILLNESS:  Charlotta Newton 60 y.o.  male African-American patient with a history of diabetes/and smoking currently admitted to hospital for chest pain/ noted to have troponins elevated around 20/ non-STEMI. He underwent angiogram/cardiac cath- that showed blockages- which appeared chronic/with collaterals; and hence no stents were placed. He is on antiplatelet therapy for medical management.  However in remission patient was also noted to have a hemoglobin of 4.9 for which he received 2 units of blood. His hemoglobin went up to 8 and again currently 6.8 this morning. Iron studies are suggestive of severe iron deficiency. LDH is high-up to 3 times normal [from cardiac]; haptoglobin is normal.  Patient resting denies any unusual shortness of breath or fatigue. He denies any blood in stools or black stools. He had episode of blood in urine while in hospital while on Heparin. Denies any constipation or diarrhea. Had about 35 pounds weight loss in the last 1 year. He'll colonoscopy 3-4 years ago- denies any major abnormalities at the time.  ROS: A complete 10 point review of system is done which is negative except mentioned above in history of present illness  MEDICAL HISTORY:  Past Medical History  Diagnosis Date  . Diabetes mellitus without complication ([place])     Not on medications  . Hypertension     Not on medications  . Polysubstance abuse     a. ongoing tobacco and alcohol abuse     SURGICAL HISTORY: Past Surgical History  Procedure Laterality Date  . Back surgery    . Patellar tendon repair    . Testicular torsion repair      SOCIAL HISTORY: Social History   Social History  . Marital Status: Divorced    Spouse Name: N/A  . Number of Children: N/A  . Years of Education: N/A   Occupational History  . Not on file.   Social  History Main Topics  . Smoking status: Current Every Day Smoker -- 1.00 packs/day    Types: Cigarettes  . Smokeless tobacco: Not on file  . Alcohol Use: 12.6 oz/week    21 Cans of beer per week     Comment: daily- beers and occasionally liquor  . Drug Use: No  . Sexual Activity: Not on file   Other Topics Concern  . Not on file   Social History Narrative   Lives at home with Mother and is his Mothers primary caregiver    FAMILY HISTORY: Family History  Problem Relation Age of Onset  . Congestive Heart Failure Mother   . Peripheral vascular disease Father     ALLERGIES:  has No Known Allergies.  MEDICATIONS:  Current Facility-Administered Medications  Medication Dose Route Frequency Provider Last Rate Last Dose  . 0.9 %  sodium chloride infusion   Intravenous Continuous Gladstone Lighter, MD 60 mL/hr at 09/06/15 0645    . acetaminophen (TYLENOL) tablet 650 mg  650 mg Oral Q6H PRN Gladstone Lighter, MD       Or  . acetaminophen (TYLENOL) suppository 650 mg  650 mg Rectal Q6H PRN Gladstone Lighter, MD      . aspirin chewable tablet 81 mg  81 mg Oral Daily Gladstone Lighter, MD   81 mg at 09/05/15 1813  . atorvastatin (LIPITOR) tablet 40 mg  40 mg Oral q1800 Areta Haber Dunn, PA-C   40 mg at 09/05/15 1813  .  carvedilol (COREG) tablet 3.125 mg  3.125 mg Oral BID WC Areta Haber Dunn, PA-C   3.125 mg at 09/05/15 1813  . morphine 2 MG/ML injection 1 mg  1 mg Intravenous Q4H PRN Rise Mu, PA-C   1 mg at 09/05/15 1222  . ondansetron (ZOFRAN) injection 4 mg  4 mg Intravenous Q6H PRN Minna Merritts, MD      . sodium chloride flush (NS) 0.9 % injection 3 mL  3 mL Intravenous Q12H Gladstone Lighter, MD   3 mL at 09/05/15 2010  . ticagrelor (BRILINTA) tablet 90 mg  90 mg Oral BID Minna Merritts, MD   90 mg at 09/05/15 2010      .  PHYSICAL EXAMINATION:   Filed Vitals:   09/05/15 2203 09/06/15 0419  BP: 137/88 127/70  Pulse: 104 88  Temp: 98.9 F (37.2 C) 99.5 F (37.5 C)  Resp:  20 18   Filed Weights   09/05/15 0732  Weight: 220 lb 10.9 oz (100.1 kg)    GENERAL: Well-nourished well-developed; Alert, no distress and comfortable.  Alone.  EYES:pallor  OROPHARYNX: no thrush or ulceration; good dentition  NECK: supple, no masses felt LYMPH:  no palpable lymphadenopathy in the cervical, axillary or inguinal regions LUNGS: clear to auscultation and  No wheeze or crackles HEART/CVS: regular rate & rhythm and no murmurs; No lower extremity edema ABDOMEN: abdomen soft, non-tender and normal bowel sounds Musculoskeletal:no cyanosis of digits and no clubbing  PSYCH: alert & oriented x 3 with fluent speech NEURO: no focal motor/sensory deficits SKIN:  no rashes or significant lesions  LABORATORY DATA:  I have reviewed the data as listed Lab Results  Component Value Date   WBC 17.6* 09/06/2015   HGB 6.8* 09/06/2015   HCT 22.8* 09/06/2015   MCV 69.7* 09/06/2015   PLT 262 09/06/2015    Recent Labs  09/05/15 0735 09/06/15 0322  NA 134* 131*  K 3.8 3.7  CL 102 103  CO2 23 24  GLUCOSE 193* 147*  BUN 14 13  CREATININE 1.13 1.03  CALCIUM 8.4* 8.1*  GFRNONAA >60 >60  GFRAA >60 >60  PROT 7.3  --   ALBUMIN 3.4*  --   AST 176*  --   ALT 24  --   ALKPHOS 70  --   BILITOT 1.0  --     RADIOGRAPHIC STUDIES: I have personally reviewed the radiological images as listed and agreed with the findings in the report. Dg Chest Port 1 View  09/05/2015  CLINICAL DATA:  Chest pain since yesterday. Swelling of the ankles. Hypertension. EXAM: PORTABLE CHEST 1 VIEW COMPARISON:  05/15/2004 FINDINGS: Artifact overlies chest. Heart size is at the upper limits of normal. Mediastinal shadows are normal. The lungs are clear. The vascularity is normal. No effusions. No bony abnormalities. IMPRESSION: No active disease. Electronically Signed   By: Nelson Chimes M.D.   On: 09/05/2015 07:53    ASSESSMENT & PLAN:   60 year old male patient currently admitted to hospital for chest  pain/non-STEMI- noted to have severe anemia hemoglobin 4.9/microcytic.  # Severe iron deficiency anemia- unclear etiology. Suspect GI or GU loss. Patient will need workup like an EGD/colonoscopy or Biopsy would based upon the results of the CAT scan.I would recommend 2 units more of PRBC transfusion. Also recommend IV iron. Also  check CEA.   # 35 pound weight loss in the last 1 year- the context of severe iron deficiency anemia. This has to be further evaluated with  CT C/A/P.   # NSTEMI-coronary artery disease with collaterals- no stents placed. On antiplatelet therapy. Discussed with cardiology/Dr.Gollan- who feels that antiplatelet therapy could be stopped if any invasive procedure as planned.    Thank you Dr.Patel  for allowing me to participate in the care of your pleasant patient. Please do not hesitate to contact me with questions or concerns in the interim.   Cammie Sickle, MD 09/06/2015 7:53 AM

## 2015-09-06 NOTE — Consult Note (Signed)
[facility] Vascular Consult Note  MRN : [id number]  Frederick Perry is a 60 y.o. (03-31-1956) male who presents with chief complaint of  Chief Complaint  Patient presents with  . Chest Pain  .  History of Present Illness: I am asked to see the patient by Dr. Posey Pronto DVT and pulmonary embolus with ongoing hematuria. He was admitted with profound anemia and a hemoglobin of only 4.9. He received 2 units of packed red blood cells and his hemoglobin today was 6.8. He is going to receive a third unit of packed red blood cells. He has been having some hematuria now for a couple of weeks. He has noticed some mild swelling in his legs but no severe pain. He has no previous history of clotting issues or family history of clotting issues to his knowledge. He has developed fatigue and shortness of breath with exertion and was found to have cardiac ischemia as well as pulmonary embolus on CT angiogram. He is not felt to be medically fit for his bladder resection but this will ultimately be required in the next several weeks. He has ongoing hematuria. He was started on heparin for his myocardial infarction as well as his pulmonary embolus and DVT, and the hematuria does seem to have worsened somewhat. He had no trauma or injury or other inciting event to cause his lower extremity DVT or pulmonary embolus. 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 He has had no recent surgery. He denies fever or chills or any signs systemic infection.  Current Facility-Administered Medications  Medication Dose Route Frequency Provider Last Rate Last Dose  . acetaminophen (TYLENOL) tablet 650 mg  650 mg Oral Q6H PRN Gladstone Lighter, MD       Or  . acetaminophen (TYLENOL) suppository 650 mg  650 mg Rectal Q6H PRN Gladstone Lighter, MD      . atorvastatin (LIPITOR) tablet 40 mg  40 mg Oral q1800 Areta Haber Dunn, PA-C   40 mg at 09/06/15 1722  . carvedilol (COREG) tablet 3.125 mg  3.125 mg Oral BID  WC Areta Haber Dunn, PA-C   3.125 mg at 09/06/15 1624  . heparin ADULT infusion 100 units/mL (25000 units/250 mL)  1,500 Units/hr Intravenous Continuous Crystal Jennefer Bravo, RPH 15 mL/hr at 09/06/15 1503 1,500 Units/hr at 09/06/15 1503  . heparin lock flush 100 unit/mL  500 Units Intracatheter Daily PRN Cammie Sickle, MD      . heparin lock flush 100 unit/mL  250 Units Intracatheter PRN Cammie Sickle, MD      . insulin aspart (novoLOG) injection 0-15 Units  0-15 Units Subcutaneous TID WC Fritzi Mandes, MD   3 Units at 09/06/15 1624  . insulin aspart (novoLOG) injection 0-5 Units  0-5 Units Subcutaneous QHS Fritzi Mandes, MD      . ondansetron [facility]) injection 4 mg  4 mg Intravenous Q6H PRN Minna Merritts, MD      . sodium chloride flush (NS) 0.9 % injection 10 mL  10 mL Intracatheter PRN Cammie Sickle, MD      . sodium chloride flush (NS) 0.9 % injection 3 mL  3 mL Intravenous Q12H Gladstone Lighter, MD   3 mL at 09/06/15 0941  . sodium chloride flush (NS) 0.9 % injection 3 mL  3 mL Intracatheter PRN Cammie Sickle, MD        Past Medical History  Diagnosis Date  . Diabetes mellitus without complication ([place])  Not on medications  . Hypertension     Not on medications  . Polysubstance abuse     a. ongoing tobacco and alcohol abuse     Past Surgical History  Procedure Laterality Date  . Back surgery    . Patellar tendon repair    . Testicular torsion repair    . Cardiac catheterization N/A 09/05/2015    Procedure: Left Heart Cath and Coronary Angiography;  Surgeon: Minna Merritts, MD;  Location: [facility];  Service: Cardiovascular;  Laterality: N/A;    Social History Social History  Substance Use Topics  . Smoking status: Current Every Day Smoker -- 1.00 packs/day    Types: Cigarettes  . Smokeless tobacco: None  . Alcohol Use: 12.6 oz/week    21 Cans of beer per week     Comment: daily- beers and occasionally liquor  No IV drug use.  Family  History Family History  Problem Relation Age of Onset  . Congestive Heart Failure Mother   . Peripheral vascular disease Father   No bleeding disorders or clotting disorders.   No Known Allergies   REVIEW OF SYSTEMS (Negative unless checked)  Constitutional: [] Weight loss  [] Fever  [] Chills Cardiac: [x] Chest pain   [] Chest pressure   [] Palpitations   [] Shortness of breath when laying flat   [] Shortness of breath at rest   [x] Shortness of breath with exertion. Vascular:  [] Pain in legs with walking   [] Pain in legs at rest   [] Pain in legs when laying flat   [] Claudication   [] Pain in feet when walking  [] Pain in feet at rest  [] Pain in feet when laying flat   [x] History of DVT   [] Phlebitis   [x] Swelling in legs   [] Varicose veins   [] Non-healing ulcers Pulmonary:   [] Uses home oxygen   [] Productive cough   [] Hemoptysis   [] Wheeze  [] COPD   [] Asthma Neurologic:  [] Dizziness  [] Blackouts   [] Seizures   [] History of stroke   [] History of TIA  [] Aphasia   [] Temporary blindness   [] Dysphagia   [] Weakness or numbness in arms   [] Weakness or numbness in legs Musculoskeletal:  [] Arthritis   [] Joint swelling   [] Joint pain   [] Low back pain Hematologic:  [] Easy bruising  [] Easy bleeding   [] Hypercoagulable state   [] Anemic  [] Hepatitis Gastrointestinal:  [] Blood in stool   [] Vomiting blood  [] Gastroesophageal reflux/heartburn   [] Difficulty swallowing. Genitourinary:  [] Chronic kidney disease   [x] Difficult urination  [] Frequent urination  [] Burning with urination   [x] Blood in urine Skin:  [] Rashes   [] Ulcers   [] Wounds Psychological:  [] History of anxiety   []  History of major depression.  Physical Examination  Filed Vitals:   09/06/15 0956 09/06/15 1024 09/06/15 1059 09/06/15 1247  BP: 113/63 99/62 100/54 113/59  Pulse: 102 95 96 70  Temp: 99.7 F (37.6 C) 99.4 F (37.4 C) 98.9 F (37.2 C) 99 F (37.2 C)  TempSrc: Oral Oral Oral Oral  Resp: 18 18 19 20   Height:      Weight:       SpO2: 99% 99% 100% 100%   Body mass index is 28.32 kg/(m^2). Gen:  WD/WN, NAD Head: /AT, No temporalis wasting. Prominent temp pulse not noted. Ear/Nose/Throat: Hearing grossly intact, nares w/o erythema or drainage, oropharynx w/o Erythema/Exudate Eyes: PERRLA, EOMI.  Neck: Supple, no nuchal rigidity.  No JVD.  Pulmonary:  Good air movement, equal bilaterally.  Cardiac: RRR, normal S1, S2 Vascular:  Vessel Right Left  Radial Palpable Palpable  Ulnar Palpable Palpable  Brachial Palpable Palpable  Carotid Palpable, without bruit Palpable, without bruit  Aorta Not palpable N/A  Femoral Palpable Palpable  Popliteal Palpable Palpable  PT Palpable Palpable  DP Palpable Palpable   Gastrointestinal: soft, non-tender/non-distended. No guarding/reflex. No masses, surgical incisions, or scars. Musculoskeletal: M/S 5/5 throughout.  Extremities without ischemic changes.  No deformity or atrophy. Mild bilateral lower extremity edema. Neurologic: CN 2-12 intact. Pain and light touch intact in extremities.  Symmetrical.  Speech is fluent. Motor exam as listed above. Psychiatric: Judgment intact, Mood & affect appropriate for pt's clinical situation. Dermatologic: No rashes or ulcers noted.  No cellulitis or open wounds. Lymph : No Cervical, Axillary, or Inguinal lymphadenopathy.   CBC Lab Results  Component Value Date   WBC 17.6* 09/06/2015   HGB 6.8* 09/06/2015   HCT 22.8* 09/06/2015   MCV 69.7* 09/06/2015   PLT 262 09/06/2015    BMET    Component Value Date/Time   NA 131* 09/06/2015 0322   K 3.7 09/06/2015 0322   CL 103 09/06/2015 0322   CO2 24 09/06/2015 0322   GLUCOSE 147* 09/06/2015 0322   BUN 13 09/06/2015 0322   CREATININE 1.03 09/06/2015 0322   CALCIUM 8.1* 09/06/2015 0322   GFRNONAA >60 09/06/2015 0322   GFRAA >60 09/06/2015 0322   Estimated Creatinine Clearance: 98.9 mL/min (by C-G formula based on Cr of 1.03).  COAG Lab Results  Component Value Date   INR  1.36 09/06/2015   INR 1.30 09/05/2015    Radiology Ct Chest W Contrast  09/06/2015  CLINICAL DATA:  60 year old male with history of hypertension and diabetes presenting with recent history of ongoing chest pain. History of congestive heart failure. Smoker. EXAM: CT CHEST, ABDOMEN, AND PELVIS WITH CONTRAST TECHNIQUE: Multidetector CT imaging of the chest, abdomen and pelvis was performed following the standard protocol during bolus administration of intravenous contrast. CONTRAST:  131mL ISOVUE-300 IOPAMIDOL (ISOVUE-300) INJECTION 61% COMPARISON:  None. FINDINGS: CT CHEST FINDINGS Mediastinum/Lymph Nodes: Heart size is mildly enlarged. There is no significant pericardial fluid, thickening or pericardial calcification. There is atherosclerosis of the thoracic aorta, the great vessels of the mediastinum and the coronary arteries, including calcified atherosclerotic plaque in the left anterior descending and left circumflex coronary arteries. However, there is no acute abnormality of the thoracic aorta. Specifically, no evidence of aneurysm or dissection. No pathologically enlarged mediastinal or hilar lymph nodes. Importantly, however, there are small filling defects in distal lobar and segmental sized pulmonary artery branches to the lower lobes of the lungs bilaterally, compatible with pulmonary embolism. Small hiatal hernia. No axillary lymphadenopathy. Lungs/Pleura: No suspicious appearing pulmonary nodules or masses. No acute consolidative airspace disease. No pleural effusions. Musculoskeletal/Soft Tissues: There are no aggressive appearing lytic or blastic lesions noted in the visualized portions of the skeleton. CT ABDOMEN AND PELVIS FINDINGS Hepatobiliary: No suspicious appearing cystic or solid hepatic lesions. No intra or extrahepatic biliary ductal dilatation. Gallbladder is normal in appearance. Pancreas: No pancreatic mass. No pancreatic ductal dilatation. No pancreatic or peripancreatic fluid or  inflammatory changes. Spleen: Unremarkable. Adrenals/Urinary Tract: Exophytic 2.4 cm low-attenuation lesion in the interpolar region of the left kidney is compatible with a simple cyst. Right kidney and bilateral adrenal glands are normal in appearance. No hydroureteronephrosis. Urinary bladder is remarkable for multiple areas of amorphous high attenuation (up to 76 HU predominantly in the dependent portion of the urinary bladder). This is mass-like in appearance, and on sagittal images appears to conform  to the walls of the urinary bladder. Stomach/Bowel: The appearance of the stomach is normal. There is no pathologic dilatation of small bowel or colon. A few scattered colonic diverticulae are noted, most evident in the descending colon, without surrounding inflammatory changes to suggest an acute diverticulitis at this time. Normal appendix. Vascular/Lymphatic: Atherosclerosis in the abdominal and pelvic vasculature, without evidence of aneurysm or dissection. There is a well-defined filling defect in the right superficial femoral vein extending into the right common femoral vein, concerning for deep venous thrombosis. This is best visualized on axial image 120 of series 2, and coronal image 72 of series 7. This appears to be nonocclusive at this time. Reproductive: Prostate gland and seminal vesicles are unremarkable in appearance. Other: No significant volume of ascites.  No pneumoperitoneum. Musculoskeletal: There are no aggressive appearing lytic or blastic lesions noted in the visualized portions of the skeleton. IMPRESSION: 1. Large amount of high attenuation material in the posterior aspect of the urinary bladder. This may simply represent a large amount of hemorrhage in the lumen of the urinary bladder, however, on sagittal images this appears to conform to the wall of the urinary bladder, which could indicate the presence of a bulky urothelial neoplasm. Urologic consultation is recommended for further  evaluation. 2. Nonocclusive pulmonary emboli are noted within distal lobar and segmental sized pulmonary artery branches to the lower lobes of the lungs bilaterally. 3. Study is also positive for deep venous thrombosis extending from at least the right superficial femoral vein into the common femoral vein. This appears to be nonocclusive at this time. 4. Mild colonic diverticulosis without evidence of acute diverticulitis at this time. 5. Additional incidental findings, as above. Critical Value/emergent results were called by telephone at the time of interpretation on 09/06/2015 at 2:17 pm to Dr. Charlaine Dalton, who verbally acknowledged these results. Electronically Signed   By: Vinnie Langton M.D.   On: 09/06/2015 14:17   Ct Abdomen Pelvis W Contrast  09/06/2015  CLINICAL DATA:  60 year old male with history of hypertension and diabetes presenting with recent history of ongoing chest pain. History of congestive heart failure. Smoker. EXAM: CT CHEST, ABDOMEN, AND PELVIS WITH CONTRAST TECHNIQUE: Multidetector CT imaging of the chest, abdomen and pelvis was performed following the standard protocol during bolus administration of intravenous contrast. CONTRAST:  165mL ISOVUE-300 IOPAMIDOL (ISOVUE-300) INJECTION 61% COMPARISON:  None. FINDINGS: CT CHEST FINDINGS Mediastinum/Lymph Nodes: Heart size is mildly enlarged. There is no significant pericardial fluid, thickening or pericardial calcification. There is atherosclerosis of the thoracic aorta, the great vessels of the mediastinum and the coronary arteries, including calcified atherosclerotic plaque in the left anterior descending and left circumflex coronary arteries. However, there is no acute abnormality of the thoracic aorta. Specifically, no evidence of aneurysm or dissection. No pathologically enlarged mediastinal or hilar lymph nodes. Importantly, however, there are small filling defects in distal lobar and segmental sized pulmonary artery branches to  the lower lobes of the lungs bilaterally, compatible with pulmonary embolism. Small hiatal hernia. No axillary lymphadenopathy. Lungs/Pleura: No suspicious appearing pulmonary nodules or masses. No acute consolidative airspace disease. No pleural effusions. Musculoskeletal/Soft Tissues: There are no aggressive appearing lytic or blastic lesions noted in the visualized portions of the skeleton. CT ABDOMEN AND PELVIS FINDINGS Hepatobiliary: No suspicious appearing cystic or solid hepatic lesions. No intra or extrahepatic biliary ductal dilatation. Gallbladder is normal in appearance. Pancreas: No pancreatic mass. No pancreatic ductal dilatation. No pancreatic or peripancreatic fluid or inflammatory changes. Spleen: Unremarkable. Adrenals/Urinary Tract: Exophytic  2.4 cm low-attenuation lesion in the interpolar region of the left kidney is compatible with a simple cyst. Right kidney and bilateral adrenal glands are normal in appearance. No hydroureteronephrosis. Urinary bladder is remarkable for multiple areas of amorphous high attenuation (up to 76 HU predominantly in the dependent portion of the urinary bladder). This is mass-like in appearance, and on sagittal images appears to conform to the walls of the urinary bladder. Stomach/Bowel: The appearance of the stomach is normal. There is no pathologic dilatation of small bowel or colon. A few scattered colonic diverticulae are noted, most evident in the descending colon, without surrounding inflammatory changes to suggest an acute diverticulitis at this time. Normal appendix. Vascular/Lymphatic: Atherosclerosis in the abdominal and pelvic vasculature, without evidence of aneurysm or dissection. There is a well-defined filling defect in the right superficial femoral vein extending into the right common femoral vein, concerning for deep venous thrombosis. This is best visualized on axial image 120 of series 2, and coronal image 72 of series 7. This appears to be  nonocclusive at this time. Reproductive: Prostate gland and seminal vesicles are unremarkable in appearance. Other: No significant volume of ascites.  No pneumoperitoneum. Musculoskeletal: There are no aggressive appearing lytic or blastic lesions noted in the visualized portions of the skeleton. IMPRESSION: 1. Large amount of high attenuation material in the posterior aspect of the urinary bladder. This may simply represent a large amount of hemorrhage in the lumen of the urinary bladder, however, on sagittal images this appears to conform to the wall of the urinary bladder, which could indicate the presence of a bulky urothelial neoplasm. Urologic consultation is recommended for further evaluation. 2. Nonocclusive pulmonary emboli are noted within distal lobar and segmental sized pulmonary artery branches to the lower lobes of the lungs bilaterally. 3. Study is also positive for deep venous thrombosis extending from at least the right superficial femoral vein into the common femoral vein. This appears to be nonocclusive at this time. 4. Mild colonic diverticulosis without evidence of acute diverticulitis at this time. 5. Additional incidental findings, as above. Critical Value/emergent results were called by telephone at the time of interpretation on 09/06/2015 at 2:17 pm to Dr. Charlaine Dalton, who verbally acknowledged these results. Electronically Signed   By: Vinnie Langton M.D.   On: 09/06/2015 14:17   Dg Chest Port 1 View  09/05/2015  CLINICAL DATA:  Chest pain since yesterday. Swelling of the ankles. Hypertension. EXAM: PORTABLE CHEST 1 VIEW COMPARISON:  05/15/2004 FINDINGS: Artifact overlies chest. Heart size is at the upper limits of normal. Mediastinal shadows are normal. The lungs are clear. The vascularity is normal. No effusions. No bony abnormalities. IMPRESSION: No active disease. Electronically Signed   By: Nelson Chimes M.D.   On: 09/05/2015 07:53      Assessment/Plan 1. DVT and PE.  This is in association with significant anemia requiring now 3 units of blood transfusion and hematuria from bladder mass. It is unlikely he will be able to continue his anticoagulation and he will certainly need his anticoagulation stopped for his bladder resection. An IVC filter is clearly indicated. Risks and benefits were discussed with the patient in detail. Planned removal of the filter and several months was also discussed. We discussed the gravity of the situation with multiple ongoing major issues. He voices his understanding and an IVC filter will be placed tomorrow morning 2. 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 Ongoing. We'll ultimately require bladder resection although urology does not feel he is stable for this  at this time. 3. Anemia. Was 4.9 on his hemoglobin on admission. Has now gotten 3 units of blood transfusion. 4. Myocardial infarction/NSTEMI. Cardiology following. His profound anemia certainly increased his risk for this. 5. Diabetes. Not on medications. 6. Tobacco abuse. Would likely increase his thrombosis risk. Smoking cessation recommended   Keyontae Huckeby, MD  09/06/2015 5:42 PM

## 2015-09-06 NOTE — Progress Notes (Signed)
ANTICOAGULATION CONSULT NOTE - Initial Consult  Pharmacy Consult for Heparin Drip Indication: pulmonary embolus and DVT  No Known Allergies  Patient Measurements: Height: 6\' 2"  (188 cm) Weight: 220 lb 10.9 oz (100.1 kg) IBW/kg (Calculated) : 82.2 Heparin Dosing Weight: 100 kg  Vital Signs: Temp: 99.1 F (37.3 C) (03/23 2000) Temp Source: Oral (03/23 2000) BP: 104/67 mmHg (03/23 2000) Pulse Rate: 88 (03/23 2000)  Labs:  Recent Labs  09/05/15 0735 09/05/15 1153 09/05/15 1730 09/05/15 1744 09/05/15 2035 09/05/15 2307 09/06/15 0322 09/06/15 1437 09/06/15 2119  HGB 4.9* 6.9*  --  8.1*  --   --  6.8*  --   --   HCT 17.4* 23.5*  --  27.6*  --   --  22.8*  --   --   PLT 261 253  --  294  --   --  262  --   --   APTT 33  --   --   --   --   --   --  38*  --   LABPROT 16.3*  --   --   --   --   --   --  16.9*  --   INR 1.30  --   --   --   --   --   --  1.36  --   HEPARINUNFRC  --   --   --   --  <0.10*  --   --   --  0.25*  CREATININE 1.13  --   --   --   --   --  1.03  --   --   TROPONINI 20.13* 20.31* 22.51*  --   --  16.10*  --   --   --     Estimated Creatinine Clearance: 98.9 mL/min (by C-G formula based on Cr of 1.03).   Medical History: Past Medical History  Diagnosis Date  . Diabetes mellitus without complication ([place])     Not on medications  . Hypertension     Not on medications  . Polysubstance abuse     a. ongoing tobacco and alcohol abuse     Medications:  Scheduled:  . atorvastatin  40 mg Oral q1800  . carvedilol  3.125 mg Oral BID WC  . Chlorhexidine Gluconate Cloth  6 each Topical Once  . heparin  1,500 Units Intravenous Once  . insulin aspart  0-15 Units Subcutaneous TID WC  . insulin aspart  0-5 Units Subcutaneous QHS  . sodium chloride flush  3 mL Intravenous Q12H   Infusions:  . sodium chloride    . heparin 1,500 Units/hr (09/06/15 1503)    Assessment: Pharmacy consulted to initiate, titrate, and monitor heparin infusion in a 60 yo  male admitted with NSTEMI.  Patient found today to have DVT and PE on CT.  Patient with hematuria and anemia.  Patient is status post pRBC transfusion (2 units).  MD would like to start heparin drip with no bolus for VTE treatment. Aspirin and Brilinta therapy have been discontinued a this time.    APTT and INR ordered STAT  Goal of Therapy:  Heparin level 0.3-0.7 units/ml Monitor platelets by anticoagulation protocol: Yes   Plan:  Start heparin infusion at 1500 units/hr Check anti-Xa level in 6 hours and daily while on heparin Continue to monitor H&H and platelets   3/23:  HL @ 21:19 = 0.25            Will order Heparin 1500  units IV X bolus and increase drip rate to 1700 units/hr.           Will recheck HL 6 hrs after rate change.   Taylan Mayhan D 09/06/2015,10:15 PM

## 2015-09-06 NOTE — Consult Note (Signed)
@ENCDATE @ 3:48 PM   Frederick Perry 1955-07-14 [id number]  Referring provider: Dr. Serita Grit  Chief Complaint  Patient presents with  . Chest Pain    HPI: The patient is a 60 year old gentleman with no past urological history who presented to the hospital with chest pain. He was subsequently diagnosed with anemia, pulmonary embolism, DVT, and NSTEMI and started on heparin. 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 In discussion the patient he denies any urinary symptoms. He notes his urine has been clear yellow and has never seen hematuria or clots. He is never seen a urologist before and denies voiding dysfunction nephrolithiasis. He was unaware of his bladder mass prior to admission. His hemoglobin was 4.9 admission. He does have microscopic hematuria however on his urinalysis.   PMH: Past Medical History  Diagnosis Date  . Diabetes mellitus without complication ([place])     Not on medications  . Hypertension     Not on medications  . Polysubstance abuse     a. ongoing tobacco and alcohol abuse     Surgical History: Past Surgical History  Procedure Laterality Date  . Back surgery    . Patellar tendon repair    . Testicular torsion repair    . Cardiac catheterization N/A 09/05/2015    Procedure: Left Heart Cath and Coronary Angiography;  Surgeon: Minna Merritts, MD;  Location: [facility];  Service: Cardiovascular;  Laterality: N/A;    Home Medications:    Medication List    ASK your doctor about these medications        aspirin EC 81 MG tablet  Take 81 mg by mouth daily as needed. Reported on 09/05/2015     metFORMIN 500 MG tablet  Commonly known as:  GLUCOPHAGE  Take 500 mg by mouth 2 (two) times daily with a meal. Reported on 09/05/2015        Allergies: No Known Allergies  Family History: Family History  Problem Relation Age of Onset  . Congestive Heart Failure Mother    . Peripheral vascular disease Father     Social History:  reports that he has been smoking Cigarettes.  He has been smoking about 1.00 pack per day. He does not have any smokeless tobacco history on file. He reports that he drinks about 12.6 oz of alcohol per week. He reports that he does not use illicit drugs.  ROS: 12 point ROS is negative except per HPI                                        Physical Exam: BP 113/59 mmHg  Pulse 70  Temp(Src) 99 F (37.2 C) (Oral)  Resp 20  Ht 6\' 2"  (1.88 m)  Wt 220 lb 10.9 oz (100.1 kg)  BMI 28.32 kg/m2  SpO2 100%  Constitutional:  Alert and oriented, No acute distress. HEENT: [place] AT, moist mucus membranes.  Trachea midline, no masses. Cardiovascular: No clubbing, cyanosis, or edema. Respiratory: Normal respiratory effort, no increased work of breathing. GI: Abdomen is soft, nontender, nondistended, no abdominal masses GU: No CVA tenderness. Normal phallus. Testicles descended equally bilaterally. Urinal bedside shows clear yellow urine without clots or hematuria. Skin: No rashes, bruises or suspicious lesions. Lymph: No cervical or inguinal adenopathy. Neurologic: Grossly intact, no focal deficits, moving all 4 extremities. Psychiatric: Normal mood  and affect.  Laboratory Data: Lab Results  Component Value Date   WBC 17.6* 09/06/2015   HGB 6.8* 09/06/2015   HCT 22.8* 09/06/2015   MCV 69.7* 09/06/2015   PLT 262 09/06/2015    Lab Results  Component Value Date   CREATININE 1.03 09/06/2015    No results found for: PSA  No results found for: TESTOSTERONE  Lab Results  Component Value Date   HGBA1C 7.4* 09/05/2015    Urinalysis    Component Value Date/Time   COLORURINE YELLOW* 09/05/2015 1915   APPEARANCEUR CLOUDY* 09/05/2015 1915   LABSPEC 1.031* 09/05/2015 1915   PHURINE 9.0* 09/05/2015 1915   GLUCOSEU NEGATIVE 09/05/2015 1915   HGBUR 3+* 09/05/2015 1915   BILIRUBINUR NEGATIVE 09/05/2015 1915    KETONESUR NEGATIVE 09/05/2015 1915   PROTEINUR 100* 09/05/2015 1915   NITRITE POSITIVE* 09/05/2015 1915   LEUKOCYTESUR NEGATIVE 09/05/2015 1915    Pertinent Imaging: CLINICAL DATA: 60 year old male with history of hypertension and diabetes presenting with recent history of ongoing chest pain. History of congestive heart failure. Smoker.  EXAM: CT CHEST, ABDOMEN, AND PELVIS WITH CONTRAST  TECHNIQUE: Multidetector CT imaging of the chest, abdomen and pelvis was performed following the standard protocol during bolus administration of intravenous contrast.  CONTRAST: 147mL ISOVUE-300 IOPAMIDOL (ISOVUE-300) INJECTION 61%  COMPARISON: None.  FINDINGS: CT CHEST FINDINGS  Mediastinum/Lymph Nodes: Heart size is mildly enlarged. There is no significant pericardial fluid, thickening or pericardial calcification. There is atherosclerosis of the thoracic aorta, the great vessels of the mediastinum and the coronary arteries, including calcified atherosclerotic plaque in the left anterior descending and left circumflex coronary arteries. However, there is no acute abnormality of the thoracic aorta. Specifically, no evidence of aneurysm or dissection. No pathologically enlarged mediastinal or hilar lymph nodes. Importantly, however, there are small filling defects in distal lobar and segmental sized pulmonary artery branches to the lower lobes of the lungs bilaterally, compatible with pulmonary embolism. Small hiatal hernia. No axillary lymphadenopathy.  Lungs/Pleura: No suspicious appearing pulmonary nodules or masses. No acute consolidative airspace disease. No pleural effusions.  Musculoskeletal/Soft Tissues: There are no aggressive appearing lytic or blastic lesions noted in the visualized portions of the skeleton.  CT ABDOMEN AND PELVIS FINDINGS  Hepatobiliary: No suspicious appearing cystic or solid hepatic lesions. No intra or extrahepatic biliary ductal  dilatation. Gallbladder is normal in appearance.  Pancreas: No pancreatic mass. No pancreatic ductal dilatation. No pancreatic or peripancreatic fluid or inflammatory changes.  Spleen: Unremarkable.  Adrenals/Urinary Tract: Exophytic 2.4 cm low-attenuation lesion in the interpolar region of the left kidney is compatible with a simple cyst. Right kidney and bilateral adrenal glands are normal in appearance. No hydroureteronephrosis. Urinary bladder is remarkable for multiple areas of amorphous high attenuation (up to 76 HU predominantly in the dependent portion of the urinary bladder). This is mass-like in appearance, and on sagittal images appears to conform to the walls of the urinary bladder.  Stomach/Bowel: The appearance of the stomach is normal. There is no pathologic dilatation of small bowel or colon. A few scattered colonic diverticulae are noted, most evident in the descending colon, without surrounding inflammatory changes to suggest an acute diverticulitis at this time. Normal appendix.  Vascular/Lymphatic: Atherosclerosis in the abdominal and pelvic vasculature, without evidence of aneurysm or dissection. There is a well-defined filling defect in the right superficial femoral vein extending into the right common femoral vein, concerning for deep venous thrombosis. This is best visualized on axial image 120 of series 2, and coronal image  72 of series 7. This appears to be nonocclusive at this time.  Reproductive: Prostate gland and seminal vesicles are unremarkable in appearance.  Other: No significant volume of ascites. No pneumoperitoneum.  Musculoskeletal: There are no aggressive appearing lytic or blastic lesions noted in the visualized portions of the skeleton.  IMPRESSION: 1. Large amount of high attenuation material in the posterior aspect of the urinary bladder. This may simply represent a large amount of hemorrhage in the lumen of the urinary  bladder, however, on sagittal images this appears to conform to the wall of the urinary bladder, which could indicate the presence of a bulky urothelial neoplasm. Urologic consultation is recommended for further evaluation. 2. Nonocclusive pulmonary emboli are noted within distal lobar and segmental sized pulmonary artery branches to the lower lobes of the lungs bilaterally. 3. Study is also positive for deep venous thrombosis extending from at least the right superficial femoral vein into the common femoral vein. This appears to be nonocclusive at this time. 4. Mild colonic diverticulosis without evidence of acute diverticulitis at this time. 5. Additional incidental findings, as above. Critical Value/emergent results were called by telephone at the time of interpretation on 09/06/2015 at 2:17 pm to Dr. Charlaine Dalton, who verbally acknowledged these results.  Assessment & Plan:    1. Bladder mass 2. Anemia 3. Pulmonary embolism with DVT 4. NSTEMI 5. 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 We discussed the next step would be a transurethral resection of bladder tumor, however he is not medically stable for this at this time as he is on a heparin drip for DVT, PE, and NSTEMI. He did have symptomatic anemia with a hemoglobin of 4.9 on admission, however he has no signs of an active large amounts of bleeding from his bladder since his urine is clear yellow without evidence of clots or gross hematuria. Theoretically, the bladder could be the source of the anemia but this would have to be a very slow chronic bleed for that to be the case due to the lack of gross hematuria and clots. I do not think he is medically stable to undergo a transurethral resection of bladder tumor at this time. From a urological standpoint, I would recommend that he is medically optimized and then discharged with  outpatient follow-up with urology to discuss TURBT in greater detail. Thank you for the consult and please call with any questions.   Nickie Retort, MD  [facility] [address] [place], [place] [address] [phone]

## 2015-09-06 NOTE — Progress Notes (Signed)
Patient: Frederick Perry / Admit Date: 09/05/2015 / Date of Encounter: 09/06/2015, 9:42 AM   Subjective: Feels well as morning, no complaints Does report having dark urine for the past day or so No abdominal symptoms Maintaining normal sinus rhythm  Review of Systems: Review of Systems  Constitutional: Negative.   Respiratory: Negative.   Cardiovascular: Negative.   Gastrointestinal: Negative.   Musculoskeletal: Negative.   Neurological: Negative.   Psychiatric/Behavioral: Negative.   All other systems reviewed and are negative.   Objective: Telemetry:  Physical Exam: Blood pressure 111/77, pulse 95, temperature 99.4 F (37.4 C), temperature source Oral, resp. rate 18, height 6\' 2"  (1.88 m), weight 220 lb 10.9 oz (100.1 kg), SpO2 100 %. Body mass index is 28.32 kg/(m^2). General: Well developed, well nourished, in no acute distress. Head: Normocephalic, atraumatic, sclera non-icteric, no xanthomas, nares are without discharge. Neck: Negative for carotid bruits. JVP not elevated. Lungs: Clear bilaterally to auscultation without wheezes, rales, or rhonchi. Breathing is unlabored. Heart: RRR S1 S2 without murmurs, rubs, or gallops.  Abdomen: Soft, non-tender, non-distended with normoactive bowel sounds. No rebound/guarding. Extremities: No clubbing or cyanosis. No edema. Distal pedal pulses are 2+ and equal bilaterally. Neuro: Alert and oriented X 3. Moves all extremities spontaneously. Psych:  Responds to questions appropriately with a normal affect.   Intake/Output Summary (Last 24 hours) at 09/06/15 0942 Last data filed at 09/06/15 0941  Gross per 24 hour  Intake   1656 ml  Output    800 ml  Net    856 ml    Inpatient Medications:  . aspirin  81 mg Oral Daily  . atorvastatin  40 mg Oral q1800  . carvedilol  3.125 mg Oral BID WC  . iohexol  25 mL Oral Q1 Hr x 2  . sodium chloride flush  3 mL Intravenous Q12H  . ticagrelor  90 mg Oral BID   Infusions:     Labs:  Recent Labs  09/05/15 0735 09/06/15 0322  NA 134* 131*  K 3.8 3.7  CL 102 103  CO2 23 24  GLUCOSE 193* 147*  BUN 14 13  CREATININE 1.13 1.03  CALCIUM 8.4* 8.1*    Recent Labs  09/05/15 0735  AST 176*  ALT 24  ALKPHOS 70  BILITOT 1.0  PROT 7.3  ALBUMIN 3.4*    Recent Labs  09/05/15 1153 09/05/15 1744 09/06/15 0322  WBC 13.7* 17.3* 17.6*  NEUTROABS 10.7*  --   --   HGB 6.9* 8.1* 6.8*  HCT 23.5* 27.6* 22.8*  MCV 70.8* 71.0* 69.7*  PLT 253 294 262    Recent Labs  09/05/15 0735 09/05/15 1153 09/05/15 1730 09/05/15 2307  TROPONINI 20.13* 20.31* 22.51* 16.10*   Invalid input(s): POCBNP  Recent Labs  09/05/15 1730  HGBA1C 7.4*     Weights: Filed Weights   09/05/15 0732  Weight: 220 lb 10.9 oz (100.1 kg)     Radiology/Studies:  Dg Chest Port 1 View  09/05/2015  CLINICAL DATA:  Chest pain since yesterday. Swelling of the ankles. Hypertension. EXAM: PORTABLE CHEST 1 VIEW COMPARISON:  05/15/2004 FINDINGS: Artifact overlies chest. Heart size is at the upper limits of normal. Mediastinal shadows are normal. The lungs are clear. The vascularity is normal. No effusions. No bony abnormalities. IMPRESSION: No active disease. Electronically Signed   By: Nelson Chimes M.D.   On: 09/05/2015 07:53     Assessment and Plan  60 y.o. male   1. NSTEMI/possible posterior MI: -Initial  troponin of 20 in the setting of hgb 4.9  cardiac catheterization yesterday  Occluded OM 2 vessel in the proximal region Occluded PDA Both vessels filled via collaterals Also with moderate mid LAD disease ----Given profound anemia, patient currently not having chest pain, event likely happened 2 days ago (that is when chest pain symptoms presented), troponins already peaked, also OM 2 relatively small to moderate size vessel,  recommend medical management --- He was started on antiplatelet therapy, now with hematuria May need to stop brilinta  2. Acute on possible  chronic microcytic anemia: -Status post transfusion of 2 units pRBC to date Receiving more blood this morning Seen by hematology ----Hematuria noted this morning Plan for CT scan of abdomen and pelvis  3. DM2: -30 pound weight loss in 12 months Management per medicine service  4. HTN: -Controlled   Signed, Esmond Plants, MD, Ph.D. [facility] 09/06/2015, 9:42 AM

## 2015-09-06 NOTE — Progress Notes (Signed)
RN notified MD Marcille Blanco about pts hgb of 6.8. No new orders given.   Iran Sizer M

## 2015-09-06 NOTE — Telephone Encounter (Signed)
-----   Message from Nickie Retort, MD sent at 09/06/2015  3:57 PM EDT ----- Patient needs to follow-up in few weeks to discuss a TURBT. He is currently in hospital with a DVT, PE, and MI.

## 2015-09-06 NOTE — Progress Notes (Addendum)
Patient ID: Frederick Perry, male   DOB: 02-25-1956, 60 y.o.   MRN: [id number] [place] [facility] [place] NAME: Frederick Perry    MR#:  [id number]  DATE OF BIRTH:  10/23/1955  SUBJECTIVE:   Patient came in with chest pain shortness of breath was found to have acute non-Q-wave MI underwent cardiac catheterization and medical management recommended denies any complaints continues to have colon: Urine. Patient is undergoing workup  REVIEW OF SYSTEMS:   Review of Systems  Constitutional: Negative for fever, chills and weight loss.  HENT: Negative for ear discharge, ear pain and nosebleeds.   Eyes: Negative for blurred vision, pain and discharge.  Respiratory: Positive for shortness of breath. Negative for sputum production, wheezing and stridor.   Cardiovascular: Negative for chest pain, palpitations, orthopnea and PND.  Gastrointestinal: Negative for nausea, vomiting, abdominal pain and diarrhea.  Genitourinary: Positive for hematuria. Negative for urgency and frequency.  Musculoskeletal: Negative for back pain and joint pain.  Neurological: Positive for weakness. Negative for sensory change, speech change and focal weakness.  Psychiatric/Behavioral: Negative for depression and hallucinations. The patient is not nervous/anxious.   All other systems reviewed and are negative.  Tolerating Diet: Yes Tolerating PT: Not needed  DRUG ALLERGIES:  No Known Allergies  VITALS:  Blood pressure 113/59, pulse 70, temperature 99 F (37.2 C), temperature source Oral, resp. rate 20, height 6\' 2"  (1.88 m), weight 100.1 kg (220 lb 10.9 oz), SpO2 100 %.  PHYSICAL EXAMINATION:   Physical Exam  GENERAL:  60 y.o.-year-old patient lying in the bed with no acute distress.  EYES: Pupils equal, round, reactive to light and accommodation. No scleral icterus. Extraocular muscles intact.  HEENT: Head atraumatic, normocephalic. Oropharynx and nasopharynx clear.   NECK:  Supple, no jugular venous distention. No thyroid enlargement, no tenderness.  LUNGS: Normal breath sounds bilaterally, no wheezing, rales, rhonchi. No use of accessory muscles of respiration.  CARDIOVASCULAR: S1, S2 normal. No murmurs, rubs, or gallops.  ABDOMEN: Soft, nontender, nondistended. Bowel sounds present. No organomegaly or mass.  EXTREMITIES: No cyanosis, clubbing or edema b/l.    NEUROLOGIC: Cranial nerves II through XII are intact. No focal Motor or sensory deficits b/l.   PSYCHIATRIC:  patient is alert and oriented x 3.  SKIN: No obvious rash, lesion, or ulcer.   LABORATORY PANEL:  CBC  Recent Labs Lab 09/06/15 0322  WBC 17.6*  HGB 6.8*  HCT 22.8*  PLT 262    Chemistries   Recent Labs Lab 09/05/15 0735 09/06/15 0322  NA 134* 131*  K 3.8 3.7  CL 102 103  CO2 23 24  GLUCOSE 193* 147*  BUN 14 13  CREATININE 1.13 1.03  CALCIUM 8.4* 8.1*  AST 176*  --   ALT 24  --   ALKPHOS 70  --   BILITOT 1.0  --    Cardiac Enzymes  Recent Labs Lab 09/05/15 2307  TROPONINI 16.10*   RADIOLOGY:  Ct Chest W Contrast  09/06/2015  CLINICAL DATA:  60 year old male with history of hypertension and diabetes presenting with recent history of ongoing chest pain. History of congestive heart failure. Smoker. EXAM: CT CHEST, ABDOMEN, AND PELVIS WITH CONTRAST TECHNIQUE: Multidetector CT imaging of the chest, abdomen and pelvis was performed following the standard protocol during bolus administration of intravenous contrast. CONTRAST:  171mL ISOVUE-300 IOPAMIDOL (ISOVUE-300) INJECTION 61% COMPARISON:  None. FINDINGS: CT CHEST FINDINGS Mediastinum/Lymph Nodes: Heart size is mildly enlarged. There is no significant  pericardial fluid, thickening or pericardial calcification. There is atherosclerosis of the thoracic aorta, the great vessels of the mediastinum and the coronary arteries, including calcified atherosclerotic plaque in the left anterior descending and left circumflex  coronary arteries. However, there is no acute abnormality of the thoracic aorta. Specifically, no evidence of aneurysm or dissection. No pathologically enlarged mediastinal or hilar lymph nodes. Importantly, however, there are small filling defects in distal lobar and segmental sized pulmonary artery branches to the lower lobes of the lungs bilaterally, compatible with pulmonary embolism. Small hiatal hernia. No axillary lymphadenopathy. Lungs/Pleura: No suspicious appearing pulmonary nodules or masses. No acute consolidative airspace disease. No pleural effusions. Musculoskeletal/Soft Tissues: There are no aggressive appearing lytic or blastic lesions noted in the visualized portions of the skeleton. CT ABDOMEN AND PELVIS FINDINGS Hepatobiliary: No suspicious appearing cystic or solid hepatic lesions. No intra or extrahepatic biliary ductal dilatation. Gallbladder is normal in appearance. Pancreas: No pancreatic mass. No pancreatic ductal dilatation. No pancreatic or peripancreatic fluid or inflammatory changes. Spleen: Unremarkable. Adrenals/Urinary Tract: Exophytic 2.4 cm low-attenuation lesion in the interpolar region of the left kidney is compatible with a simple cyst. Right kidney and bilateral adrenal glands are normal in appearance. No hydroureteronephrosis. Urinary bladder is remarkable for multiple areas of amorphous high attenuation (up to 76 HU predominantly in the dependent portion of the urinary bladder). This is mass-like in appearance, and on sagittal images appears to conform to the walls of the urinary bladder. Stomach/Bowel: The appearance of the stomach is normal. There is no pathologic dilatation of small bowel or colon. A few scattered colonic diverticulae are noted, most evident in the descending colon, without surrounding inflammatory changes to suggest an acute diverticulitis at this time. Normal appendix. Vascular/Lymphatic: Atherosclerosis in the abdominal and pelvic vasculature, without  evidence of aneurysm or dissection. There is a well-defined filling defect in the right superficial femoral vein extending into the right common femoral vein, concerning for deep venous thrombosis. This is best visualized on axial image 120 of series 2, and coronal image 72 of series 7. This appears to be nonocclusive at this time. Reproductive: Prostate gland and seminal vesicles are unremarkable in appearance. Other: No significant volume of ascites.  No pneumoperitoneum. Musculoskeletal: There are no aggressive appearing lytic or blastic lesions noted in the visualized portions of the skeleton. IMPRESSION: 1. Large amount of high attenuation material in the posterior aspect of the urinary bladder. This may simply represent a large amount of hemorrhage in the lumen of the urinary bladder, however, on sagittal images this appears to conform to the wall of the urinary bladder, which could indicate the presence of a bulky urothelial neoplasm. Urologic consultation is recommended for further evaluation. 2. Nonocclusive pulmonary emboli are noted within distal lobar and segmental sized pulmonary artery branches to the lower lobes of the lungs bilaterally. 3. Study is also positive for deep venous thrombosis extending from at least the right superficial femoral vein into the common femoral vein. This appears to be nonocclusive at this time. 4. Mild colonic diverticulosis without evidence of acute diverticulitis at this time. 5. Additional incidental findings, as above. Critical Value/emergent results were called by telephone at the time of interpretation on 09/06/2015 at 2:17 pm to Dr. Charlaine Dalton, who verbally acknowledged these results. Electronically Signed   By: Vinnie Langton M.D.   On: 09/06/2015 14:17   Ct Abdomen Pelvis W Contrast  09/06/2015  CLINICAL DATA:  60 year old male with history of hypertension and diabetes presenting with recent history  of ongoing chest pain. History of congestive heart  failure. Smoker. EXAM: CT CHEST, ABDOMEN, AND PELVIS WITH CONTRAST TECHNIQUE: Multidetector CT imaging of the chest, abdomen and pelvis was performed following the standard protocol during bolus administration of intravenous contrast. CONTRAST:  129mL ISOVUE-300 IOPAMIDOL (ISOVUE-300) INJECTION 61% COMPARISON:  None. FINDINGS: CT CHEST FINDINGS Mediastinum/Lymph Nodes: Heart size is mildly enlarged. There is no significant pericardial fluid, thickening or pericardial calcification. There is atherosclerosis of the thoracic aorta, the great vessels of the mediastinum and the coronary arteries, including calcified atherosclerotic plaque in the left anterior descending and left circumflex coronary arteries. However, there is no acute abnormality of the thoracic aorta. Specifically, no evidence of aneurysm or dissection. No pathologically enlarged mediastinal or hilar lymph nodes. Importantly, however, there are small filling defects in distal lobar and segmental sized pulmonary artery branches to the lower lobes of the lungs bilaterally, compatible with pulmonary embolism. Small hiatal hernia. No axillary lymphadenopathy. Lungs/Pleura: No suspicious appearing pulmonary nodules or masses. No acute consolidative airspace disease. No pleural effusions. Musculoskeletal/Soft Tissues: There are no aggressive appearing lytic or blastic lesions noted in the visualized portions of the skeleton. CT ABDOMEN AND PELVIS FINDINGS Hepatobiliary: No suspicious appearing cystic or solid hepatic lesions. No intra or extrahepatic biliary ductal dilatation. Gallbladder is normal in appearance. Pancreas: No pancreatic mass. No pancreatic ductal dilatation. No pancreatic or peripancreatic fluid or inflammatory changes. Spleen: Unremarkable. Adrenals/Urinary Tract: Exophytic 2.4 cm low-attenuation lesion in the interpolar region of the left kidney is compatible with a simple cyst. Right kidney and bilateral adrenal glands are normal in  appearance. No hydroureteronephrosis. Urinary bladder is remarkable for multiple areas of amorphous high attenuation (up to 76 HU predominantly in the dependent portion of the urinary bladder). This is mass-like in appearance, and on sagittal images appears to conform to the walls of the urinary bladder. Stomach/Bowel: The appearance of the stomach is normal. There is no pathologic dilatation of small bowel or colon. A few scattered colonic diverticulae are noted, most evident in the descending colon, without surrounding inflammatory changes to suggest an acute diverticulitis at this time. Normal appendix. Vascular/Lymphatic: Atherosclerosis in the abdominal and pelvic vasculature, without evidence of aneurysm or dissection. There is a well-defined filling defect in the right superficial femoral vein extending into the right common femoral vein, concerning for deep venous thrombosis. This is best visualized on axial image 120 of series 2, and coronal image 72 of series 7. This appears to be nonocclusive at this time. Reproductive: Prostate gland and seminal vesicles are unremarkable in appearance. Other: No significant volume of ascites.  No pneumoperitoneum. Musculoskeletal: There are no aggressive appearing lytic or blastic lesions noted in the visualized portions of the skeleton. IMPRESSION: 1. Large amount of high attenuation material in the posterior aspect of the urinary bladder. This may simply represent a large amount of hemorrhage in the lumen of the urinary bladder, however, on sagittal images this appears to conform to the wall of the urinary bladder, which could indicate the presence of a bulky urothelial neoplasm. Urologic consultation is recommended for further evaluation. 2. Nonocclusive pulmonary emboli are noted within distal lobar and segmental sized pulmonary artery branches to the lower lobes of the lungs bilaterally. 3. Study is also positive for deep venous thrombosis extending from at least  the right superficial femoral vein into the common femoral vein. This appears to be nonocclusive at this time. 4. Mild colonic diverticulosis without evidence of acute diverticulitis at this time. 5. Additional incidental findings,  as above. Critical Value/emergent results were called by telephone at the time of interpretation on 09/06/2015 at 2:17 pm to Dr. Charlaine Dalton, who verbally acknowledged these results. Electronically Signed   By: Vinnie Langton M.D.   On: 09/06/2015 14:17   Dg Chest Port 1 View  09/05/2015  CLINICAL DATA:  Chest pain since yesterday. Swelling of the ankles. Hypertension. EXAM: PORTABLE CHEST 1 VIEW COMPARISON:  05/15/2004 FINDINGS: Artifact overlies chest. Heart size is at the upper limits of normal. Mediastinal shadows are normal. The lungs are clear. The vascularity is normal. No effusions. No bony abnormalities. IMPRESSION: No active disease. Electronically Signed   By: Nelson Chimes M.D.   On: 09/05/2015 07:53   ASSESSMENT AND PLAN:  Ann Kindschi is a 60 y.o. male with a known history of Hypertension and diabetes mellitus not taking any medications for more than a year now presents to the Hospital due to ongoing chest pain.  #1 acute on chronic anemia-unknown baseline. Hemoglobin of 4.9. MCV is low to low, possibly chronic anemia. -Stool for occult blood and anemia labs ordered. -Hematology consult appreciated. 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  -Patient came in with hemoglobin of 4.9 he received 2 units of blood transfusion hemoglobin went up to 8.0 drop down to 6.8 he received a third unit of transfusion today   #2 acute NSTEMI-Patient does have some chest pain, appears very comfortable. Vitals are stable. EKG with ST depressions in lateral leads. -Patient underwent  cardiac catheter was found to have diffuse distal disease. Medical management recommended by cardiology. Patient was placed on brilinta and aspirin -After CT scan of the abdomen it was discussed with Dr. Amie Critchley aspirin were discontinued since patient is requiring IV heparin for DVT and PE.  #3 bilateral distal branch PE and right superficial femoral to common femoral DVT -Patient started on heparin drip, no bolus -Patient explained risk and benefits for heparin drip at present he is made aware given his bladder mass he may have significant hematuria -Vascular consultation for evaluation regarding IVC filter placement  #4 diabetes mellitus-A1c is pending. Monitor sugars. Hasn't been taking any medications for a while.  #5 hematuria workup shows patient has bulky urothelial neoplasm. Urology consultation placed. -Await further recommendations.  #6 DVT prophylaxis- on IV heparin drip  #7 hypertension-blood pressure is within normal limits. Will not start on any antihypertensives with his anemia and borderline low BP now   Case discussed with Care Management/Social Worker. Management plans discussed with the patient, family and they are in agreement.  CODE STATUS: Full  TOTAL criticalTIME TAKING CARE OF THIS PATIENT:30 minutes.  >50% time spent on counselling and coordination of care  POSSIBLE D/C IN2-3 DAYS, DEPENDING ON CLINICAL CONDITION.  Note: This dictation was prepared with Dragon dictation along with smaller phrase technology. Any transcriptional errors that result from this process are unintentional.  Zali Kamaka M.D on 09/06/2015 at 3:44 PM  Between 7am to 6pm - Pager - [phone]  After 6pm go to [email or web address] - password [email or web address] [facility]  [facility]  Office  [phone]  CC: Primary care physician; No primary care provider on file.

## 2015-09-06 NOTE — Care Management Note (Signed)
Case Management Note  Patient Details  Name: DEMARIAN EPPS MRN: [id number] Date of Birth: 1955-12-02  Subjective/Objective:  CM assessment for discharge planning. Uninsured patient. Met with him at bedside. He states he stopped working in order to care for his mother who he now lives with. He has no PCP and takes no routine medications. Provided patient with application to [facility].  Email with demographics sent to Bonnee Quin with referral to [facility]      Patient is independent, active and drives at baseline. No home health needs anticipated.             Action/Plan:   Expected Discharge Date:                  Expected Discharge Plan:  Home/Self Care  In-House Referral:     Discharge planning Services  CM Consult, Medication Assistance, [place] not met per provider  Post Acute Care Choice:    Choice offered to:     DME Arranged:    DME Agency:     HH Arranged:    HH Agency:     Status of Service:  Completed, signed off  Medicare Important Message Given:    Date Medicare IM Given:    Medicare IM give by:    Date Additional Medicare IM Given:    Additional Medicare Important Message give by:     If discussed at [place] of Stay Meetings, dates discussed:    Additional Comments:  Jolly Mango, RN 09/06/2015, 2:55 PM

## 2015-09-07 ENCOUNTER — Encounter: Payer: Self-pay | Admitting: *Deleted

## 2015-09-07 ENCOUNTER — Other Ambulatory Visit: Payer: Self-pay | Admitting: Internal Medicine

## 2015-09-07 ENCOUNTER — Encounter
Admission: EM | Disposition: A | Discharge status: Home / Self Care | Payer: Self-pay | Source: Home / Self Care | Attending: Internal Medicine

## 2015-09-07 DIAGNOSIS — D494 Neoplasm of unspecified behavior of bladder: Secondary | ICD-10-CM

## 2015-09-07 DIAGNOSIS — D500 Iron deficiency anemia secondary to blood loss (chronic): Secondary | ICD-10-CM

## 2015-09-07 HISTORY — PX: PERIPHERAL VASCULAR CATHETERIZATION: SHX172C

## 2015-09-07 LAB — CEA: CEA: 2.4 ng/mL (ref 0.0–4.7)

## 2015-09-07 LAB — HEPARIN LEVEL (UNFRACTIONATED): HEPARIN UNFRACTIONATED: 0.53 [IU]/mL (ref 0.30–0.70)

## 2015-09-07 LAB — PREPARE RBC (CROSSMATCH)

## 2015-09-07 LAB — GLUCOSE, CAPILLARY
GLUCOSE-CAPILLARY: 116 mg/dL — AB (ref 65–99)
GLUCOSE-CAPILLARY: 189 mg/dL — AB (ref 65–99)
Glucose-Capillary: 154 mg/dL — ABNORMAL HIGH (ref 65–99)
Glucose-Capillary: 154 mg/dL — ABNORMAL HIGH (ref 65–99)

## 2015-09-07 LAB — BASIC METABOLIC PANEL
ANION GAP: 6 (ref 5–15)
BUN: 13 mg/dL (ref 6–20)
CHLORIDE: 104 mmol/L (ref 101–111)
CO2: 23 mmol/L (ref 22–32)
Calcium: 8 mg/dL — ABNORMAL LOW (ref 8.9–10.3)
Creatinine, Ser: 0.96 mg/dL (ref 0.61–1.24)
GFR calc non Af Amer: >60 mL/min (ref >60)
Glucose, Bld: 116 mg/dL — ABNORMAL HIGH (ref 65–99)
POTASSIUM: 3.6 mmol/L (ref 3.5–5.1)
SODIUM: 133 mmol/L — AB (ref 135–145)

## 2015-09-07 LAB — CBC
HEMATOCRIT: 24.3 % — AB (ref 40.0–52.0)
Hemoglobin: 7.4 g/dL — ABNORMAL LOW (ref 13.0–18.0)
MCH: 21.9 pg — ABNORMAL LOW (ref 26.0–34.0)
MCHC: 30.6 g/dL — AB (ref 32.0–36.0)
MCV: 71.6 fL — AB (ref 80.0–100.0)
Platelets: 299 10*3/uL (ref 150–440)
RBC: 3.4 MIL/uL — ABNORMAL LOW (ref 4.40–5.90)
RDW: 26.3 % — AB (ref 11.5–14.5)
WBC: 18.3 10*3/uL — ABNORMAL HIGH (ref 3.8–10.6)

## 2015-09-07 SURGERY — IVC FILTER INSERTION
Anesthesia: Moderate Sedation

## 2015-09-07 MED ORDER — SODIUM CHLORIDE 0.9 % IV SOLN: 250.0000 mL | Freq: Once | INTRAVENOUS | Status: DC

## 2015-09-07 MED ORDER — DEXTROSE 5 % IV SOLN
1.5000 g | Freq: Once | INTRAVENOUS | Status: AC
  Administered 2015-09-07: 1.5 g via INTRAVENOUS

## 2015-09-07 MED ORDER — IOPAMIDOL (ISOVUE-300) INJECTION 61%
INTRAVENOUS | Status: DC | PRN
  Administered 2015-09-07: 10 mL via INTRA_ARTERIAL

## 2015-09-07 MED ORDER — HEPARIN (PORCINE) IN NACL 2-0.9 UNIT/ML-% IJ SOLN
INTRAMUSCULAR | Status: AC
  Filled 2015-09-07: qty 500

## 2015-09-07 MED ORDER — SODIUM CHLORIDE 0.9% FLUSH: 3.0000 mL | INTRAVENOUS | Status: DC | PRN

## 2015-09-07 MED ORDER — DIPHENHYDRAMINE HCL 25 MG PO CAPS
25.0000 mg | ORAL_CAPSULE | Freq: Once | ORAL | Status: AC
  Administered 2015-09-07: 25 mg via ORAL
  Filled 2015-09-07: qty 1

## 2015-09-07 MED ORDER — DEXTROSE 5 % IV SOLN
INTRAVENOUS | Status: AC
Start: 2015-09-07 — End: 2015-09-07
  Filled 2015-09-07 (×36): qty 1.5

## 2015-09-07 MED ORDER — ACETAMINOPHEN 325 MG PO TABS
650.0000 mg | ORAL_TABLET | Freq: Once | ORAL | Status: AC
  Administered 2015-09-07: 650 mg via ORAL
  Filled 2015-09-07: qty 2

## 2015-09-07 MED ORDER — HEPARIN SOD (PORK) LOCK FLUSH 100 UNIT/ML IV SOLN
250.0000 [IU] | INTRAVENOUS | Status: DC | PRN
  Filled 2015-09-07: qty 3

## 2015-09-07 MED ORDER — FENTANYL CITRATE (PF) 100 MCG/2ML IJ SOLN
INTRAMUSCULAR | Status: DC | PRN
  Administered 2015-09-07 (×2): 50 ug via INTRAVENOUS

## 2015-09-07 MED ORDER — HEPARIN SOD (PORK) LOCK FLUSH 100 UNIT/ML IV SOLN
500.0000 [IU] | Freq: Every day | INTRAVENOUS | Status: DC | PRN
  Filled 2015-09-07: qty 5

## 2015-09-07 MED ORDER — ENOXAPARIN SODIUM 150 MG/ML ~~LOC~~ SOLN
1.5000 mg/kg | SUBCUTANEOUS | Status: DC
  Administered 2015-09-07 – 2015-09-08 (×2): 150 mg via SUBCUTANEOUS
  Filled 2015-09-07 (×2): qty 1

## 2015-09-07 MED ORDER — LIDOCAINE-EPINEPHRINE (PF) 1 %-1:200000 IJ SOLN
INTRAMUSCULAR | Status: AC
  Filled 2015-09-07: qty 30

## 2015-09-07 MED ORDER — FENTANYL CITRATE (PF) 100 MCG/2ML IJ SOLN
INTRAMUSCULAR | Status: AC
  Filled 2015-09-07: qty 2

## 2015-09-07 MED ORDER — MIDAZOLAM HCL 2 MG/2ML IJ SOLN
INTRAMUSCULAR | Status: DC | PRN
  Administered 2015-09-07: 2 mg via INTRAVENOUS
  Administered 2015-09-07: 1 mg via INTRAVENOUS

## 2015-09-07 MED ORDER — MIDAZOLAM HCL 5 MG/5ML IJ SOLN
INTRAMUSCULAR | Status: AC
  Filled 2015-09-07: qty 5

## 2015-09-07 MED ORDER — SODIUM CHLORIDE 0.9% FLUSH: 10.0000 mL | INTRAVENOUS | Status: DC | PRN

## 2015-09-07 MED ORDER — SODIUM CHLORIDE 0.9 % IV SOLN
510.0000 mg | Freq: Once | INTRAVENOUS | Status: AC
  Administered 2015-09-07: 510 mg via INTRAVENOUS
  Filled 2015-09-07: qty 17

## 2015-09-07 SURGICAL SUPPLY — 3 items
FILTER VC CELECT-FEMORAL (Filter) ×1 IMPLANT
PACK ANGIOGRAPHY (CUSTOM PROCEDURE TRAY) ×1 IMPLANT
WIRE J 3MM .035X145CM (WIRE) ×2 IMPLANT

## 2015-09-07 NOTE — Progress Notes (Signed)
Patient ID: Frederick Perry, male   DOB: May 21, 1956, 60 y.o.   MRN: [id number]  script for Lovenox,  Lovastatin and coreg given to CM for getting meds from medication management clinic

## 2015-09-07 NOTE — Care Management (Signed)
Faxed patient application to medication management along with prescription. Will pick up today and deliver to patients room.

## 2015-09-07 NOTE — Op Note (Signed)
[facility]   OPERATIVE NOTE    PRE-OPERATIVE DIAGNOSIS: DVT, PE, hematuria, anemia, and bladder mass  POST-OPERATIVE DIAGNOSIS: same  PROCEDURE: 1.   Ultrasound guidance for vascular access to the left femoral vein 2.   Catheter placement into the inferior vena cava 3.   Inferior venacavogram 4.   Placement of a Cook Celect IVC filter  SURGEON: Leotis Pain, MD  ASSISTANT(S): None  ANESTHESIA: local with Moderate Conscious Sedation for approximately 15 minutes using 3 mg of Versed and 100 mcg of Fentanyl  ESTIMATED BLOOD LOSS: minimal  CONTRAST: 15 cc  FLUORO TIME: less than one minute  FINDING(S): 1.  Patent IVC  SPECIMEN(S):  none  INDICATIONS:   Frederick Perry is a 60 y.o. male who presents with DVT, PE, hematuria from a bladder mass, and anemia.  Inferior vena cava filter is indicated for this reason.  Risks and benefits including filter thrombosis, migration, fracture, bleeding, and infection were all discussed.  We discussed that all IVC filters that we place can be removed if desired from the patient once the need for the filter has passed.    DESCRIPTION: After obtaining full informed written consent, the patient was brought back to the vascular suite. The skin was sterilely prepped and draped in a sterile surgical field was created. Moderate conscious sedation was administered during a face to face encounter with the patient throughout the procedure with my supervision of the RN administering medicines and monitoring the patient's vital signs, pulse oximetry, telemetry and mental status throughout from the start of the procedure until the patient was taken to the recovery room. The left femoral vein was accessed under direct ultrasound guidance without difficulty with a Seldinger needle and a J-wire was then placed. After skin nick and dilatation, the delivery sheath was placed into the inferior vena cava and an inferior venacavogram was  performed. This demonstrated a patent IVC with the level of the renal veins at L1-L2 interspace.  The filter was then deployed into the inferior vena cava at the level of the top of L2 just below the renal veins. The delivery sheath was then removed. Pressure was held. Sterile dressings were placed. The patient tolerated the procedure well and was taken to the recovery room in stable condition.  COMPLICATIONS: None  CONDITION: Stable  Frederick Perry  09/07/2015, 8:29 AM

## 2015-09-07 NOTE — Progress Notes (Signed)
A&O. Still on heparin drip. No complaints. For IVC filter placement.

## 2015-09-07 NOTE — H&P (Signed)
  [facility] History & Physical Update  The patient was interviewed and re-examined.  The patient's previous History and Physical has been reviewed and is unchanged. See my consult note from yesterday. There is no change in the plan of care. We plan to proceed with the scheduled procedure.  DEW,JASON, MD  09/07/2015, 8:04 AM

## 2015-09-07 NOTE — Progress Notes (Signed)
ANTICOAGULATION CONSULT NOTE - Initial Consult  Pharmacy Consult for Heparin Drip Indication: pulmonary embolus and DVT  No Known Allergies  Patient Measurements: Height: 6\' 2"  (188 cm) Weight: 220 lb 10.9 oz (100.1 kg) IBW/kg (Calculated) : 82.2 Heparin Dosing Weight: 100 kg  Vital Signs: Temp: 99.1 F (37.3 C) (03/24 0515) Temp Source: Oral (03/24 0515) BP: 114/69 mmHg (03/24 0515) Pulse Rate: 94 (03/24 0515)  Labs:  Recent Labs  09/05/15 0735 09/05/15 1153 09/05/15 1730 09/05/15 1744 09/05/15 2035 09/05/15 2307 09/06/15 0322 09/06/15 1437 09/06/15 2119 09/07/15 0510  HGB 4.9* 6.9*  --  8.1*  --   --  6.8*  --   --  7.4*  HCT 17.4* 23.5*  --  27.6*  --   --  22.8*  --   --  24.3*  PLT 261 253  --  294  --   --  262  --   --  299  APTT 33  --   --   --   --   --   --  38*  --   --   LABPROT 16.3*  --   --   --   --   --   --  16.9*  --   --   INR 1.30  --   --   --   --   --   --  1.36  --   --   HEPARINUNFRC  --   --   --   --  <0.10*  --   --   --  0.25* 0.53  CREATININE 1.13  --   --   --   --   --  1.03  --   --  0.96  TROPONINI 20.13* 20.31* 22.51*  --   --  16.10*  --   --   --   --     Estimated Creatinine Clearance: 106.1 mL/min (by C-G formula based on Cr of 0.96).   Medical History: Past Medical History  Diagnosis Date  . Diabetes mellitus without complication ([place])     Not on medications  . Hypertension     Not on medications  . Polysubstance abuse     a. ongoing tobacco and alcohol abuse     Medications:  Scheduled:  . atorvastatin  40 mg Oral q1800  . carvedilol  3.125 mg Oral BID WC  . insulin aspart  0-15 Units Subcutaneous TID WC  . insulin aspart  0-5 Units Subcutaneous QHS  . sodium chloride flush  3 mL Intravenous Q12H   Infusions:  . sodium chloride 20 mL/hr at 09/06/15 1930  . heparin 1,700 Units/hr (09/06/15 2233)    Assessment: Pharmacy consulted to initiate, titrate, and monitor heparin infusion in a 60 yo male admitted  with NSTEMI.  Patient found today to have DVT and PE on CT.  Patient with hematuria and anemia.  Patient is status post pRBC transfusion (2 units).  MD would like to start heparin drip with no bolus for VTE treatment. Aspirin and Brilinta therapy have been discontinued a this time.    APTT and INR ordered STAT  Goal of Therapy:  Heparin level 0.3-0.7 units/ml Monitor platelets by anticoagulation protocol: Yes   Plan:  Start heparin infusion at 1500 units/hr Check anti-Xa level in 6 hours and daily while on heparin Continue to monitor H&H and platelets   3/23:  HL @ 21:19 = 0.25  Will order Heparin 1500 units IV X bolus and increase drip rate to 1700 units/hr.           Will recheck HL 6 hrs after rate change.   3/24 AM heparin level 0.53. Recheck in 6 hours to confirm.  Dezmond Downie S 09/07/2015,6:35 AM

## 2015-09-07 NOTE — Progress Notes (Signed)
Patient ID: Frederick Perry, male   DOB: 05/02/1956, 60 y.o.   MRN: [id number] [facility] [place] NAME: Frederick Perry    MR#:  [id number]  DATE OF BIRTH:  [birth year]  SUBJECTIVE:  Patient came in with chest pain shortness of breath was found to have acute non-Q-wave MI underwent cardiac catheterization and medical management recommended denies any complaints continues to have colon: Urine. Patient is undergoing workup  REVIEW OF SYSTEMS:   Review of Systems  Constitutional: Negative for fever, chills and weight loss.  HENT: Negative for ear discharge, ear pain and nosebleeds.   Eyes: Negative for blurred vision, pain and discharge.  Respiratory: Positive for shortness of breath. Negative for sputum production, wheezing and stridor.   Cardiovascular: Negative for chest pain, palpitations, orthopnea and PND.  Gastrointestinal: Negative for nausea, vomiting, abdominal pain and diarrhea.  Genitourinary: Positive for hematuria. Negative for urgency and frequency.  Musculoskeletal: Negative for back pain and joint pain.  Neurological: Positive for weakness. Negative for sensory change, speech change and focal weakness.  Psychiatric/Behavioral: Negative for depression and hallucinations. The patient is not nervous/anxious.   All other systems reviewed and are negative.  Tolerating Diet: Yes Tolerating PT: Not needed  DRUG ALLERGIES:  No Known Allergies  VITALS:  Blood pressure 106/69, pulse 68, temperature 98.3 F (36.8 C), temperature source Oral, resp. rate 18, height 6\' 2"  (1.88 m), weight 100.1 kg (220 lb 10.9 oz), SpO2 95 %.  PHYSICAL EXAMINATION:   Physical Exam  GENERAL:  60 y.o.-year-old patient lying in the bed with no acute distress.  EYES: Pupils equal, round, reactive to light and accommodation. No scleral icterus. Extraocular muscles intact.  HEENT: Head atraumatic, normocephalic. Oropharynx and nasopharynx clear.   NECK:  Supple, no jugular venous distention. No thyroid enlargement, no tenderness.  LUNGS: Normal breath sounds bilaterally, no wheezing, rales, rhonchi. No use of accessory muscles of respiration.  CARDIOVASCULAR: S1, S2 normal. No murmurs, rubs, or gallops.  ABDOMEN: Soft, nontender, nondistended. Bowel sounds present. No organomegaly or mass.  EXTREMITIES: No cyanosis, clubbing or edema b/l.    NEUROLOGIC: Cranial nerves II through XII are intact. No focal Motor or sensory deficits b/l.   PSYCHIATRIC:  patient is alert and oriented x 3.  SKIN: No obvious rash, lesion, or ulcer.   LABORATORY PANEL:  CBC  Recent Labs Lab 09/07/15 0510  WBC 18.3*  HGB 7.4*  HCT 24.3*  PLT 299    Chemistries   Recent Labs Lab 09/05/15 0735  09/07/15 0510  NA 134*  < > 133*  K 3.8  < > 3.6  CL 102  < > 104  CO2 23  < > 23  GLUCOSE 193*  < > 116*  BUN 14  < > 13  CREATININE 1.13  < > 0.96  CALCIUM 8.4*  < > 8.0*  AST 176*  --   --   ALT 24  --   --   ALKPHOS 70  --   --   BILITOT 1.0  --   --   < > = values in this interval not displayed. Cardiac Enzymes  Recent Labs Lab 09/05/15 2307  TROPONINI 16.10*   RADIOLOGY:  Ct Chest W Contrast  09/06/2015  CLINICAL DATA:  60 year old male with history of hypertension and diabetes presenting with recent history of ongoing chest pain. History of congestive heart failure. Smoker. EXAM: CT CHEST, ABDOMEN, AND PELVIS WITH CONTRAST TECHNIQUE: Multidetector CT imaging  of the chest, abdomen and pelvis was performed following the standard protocol during bolus administration of intravenous contrast. CONTRAST:  125mL ISOVUE-300 IOPAMIDOL (ISOVUE-300) INJECTION 61% COMPARISON:  None. FINDINGS: CT CHEST FINDINGS Mediastinum/Lymph Nodes: Heart size is mildly enlarged. There is no significant pericardial fluid, thickening or pericardial calcification. There is atherosclerosis of the thoracic aorta, the great vessels of the mediastinum and the coronary  arteries, including calcified atherosclerotic plaque in the left anterior descending and left circumflex coronary arteries. However, there is no acute abnormality of the thoracic aorta. Specifically, no evidence of aneurysm or dissection. No pathologically enlarged mediastinal or hilar lymph nodes. Importantly, however, there are small filling defects in distal lobar and segmental sized pulmonary artery branches to the lower lobes of the lungs bilaterally, compatible with pulmonary embolism. Small hiatal hernia. No axillary lymphadenopathy. Lungs/Pleura: No suspicious appearing pulmonary nodules or masses. No acute consolidative airspace disease. No pleural effusions. Musculoskeletal/Soft Tissues: There are no aggressive appearing lytic or blastic lesions noted in the visualized portions of the skeleton. CT ABDOMEN AND PELVIS FINDINGS Hepatobiliary: No suspicious appearing cystic or solid hepatic lesions. No intra or extrahepatic biliary ductal dilatation. Gallbladder is normal in appearance. Pancreas: No pancreatic mass. No pancreatic ductal dilatation. No pancreatic or peripancreatic fluid or inflammatory changes. Spleen: Unremarkable. Adrenals/Urinary Tract: Exophytic 2.4 cm low-attenuation lesion in the interpolar region of the left kidney is compatible with a simple cyst. Right kidney and bilateral adrenal glands are normal in appearance. No hydroureteronephrosis. Urinary bladder is remarkable for multiple areas of amorphous high attenuation (up to 76 HU predominantly in the dependent portion of the urinary bladder). This is mass-like in appearance, and on sagittal images appears to conform to the walls of the urinary bladder. Stomach/Bowel: The appearance of the stomach is normal. There is no pathologic dilatation of small bowel or colon. A few scattered colonic diverticulae are noted, most evident in the descending colon, without surrounding inflammatory changes to suggest an acute diverticulitis at this  time. Normal appendix. Vascular/Lymphatic: Atherosclerosis in the abdominal and pelvic vasculature, without evidence of aneurysm or dissection. There is a well-defined filling defect in the right superficial femoral vein extending into the right common femoral vein, concerning for deep venous thrombosis. This is best visualized on axial image 120 of series 2, and coronal image 72 of series 7. This appears to be nonocclusive at this time. Reproductive: Prostate gland and seminal vesicles are unremarkable in appearance. Other: No significant volume of ascites.  No pneumoperitoneum. Musculoskeletal: There are no aggressive appearing lytic or blastic lesions noted in the visualized portions of the skeleton. IMPRESSION: 1. Large amount of high attenuation material in the posterior aspect of the urinary bladder. This may simply represent a large amount of hemorrhage in the lumen of the urinary bladder, however, on sagittal images this appears to conform to the wall of the urinary bladder, which could indicate the presence of a bulky urothelial neoplasm. Urologic consultation is recommended for further evaluation. 2. Nonocclusive pulmonary emboli are noted within distal lobar and segmental sized pulmonary artery branches to the lower lobes of the lungs bilaterally. 3. Study is also positive for deep venous thrombosis extending from at least the right superficial femoral vein into the common femoral vein. This appears to be nonocclusive at this time. 4. Mild colonic diverticulosis without evidence of acute diverticulitis at this time. 5. Additional incidental findings, as above. Critical Value/emergent results were called by telephone at the time of interpretation on 09/06/2015 at 2:17 pm to Dr. Charlaine Dalton,  who verbally acknowledged these results. Electronically Signed   By: Vinnie Langton M.D.   On: 09/06/2015 14:17   Ct Abdomen Pelvis W Contrast  09/06/2015  CLINICAL DATA:  60 year old male with history of  hypertension and diabetes presenting with recent history of ongoing chest pain. History of congestive heart failure. Smoker. EXAM: CT CHEST, ABDOMEN, AND PELVIS WITH CONTRAST TECHNIQUE: Multidetector CT imaging of the chest, abdomen and pelvis was performed following the standard protocol during bolus administration of intravenous contrast. CONTRAST:  158mL ISOVUE-300 IOPAMIDOL (ISOVUE-300) INJECTION 61% COMPARISON:  None. FINDINGS: CT CHEST FINDINGS Mediastinum/Lymph Nodes: Heart size is mildly enlarged. There is no significant pericardial fluid, thickening or pericardial calcification. There is atherosclerosis of the thoracic aorta, the great vessels of the mediastinum and the coronary arteries, including calcified atherosclerotic plaque in the left anterior descending and left circumflex coronary arteries. However, there is no acute abnormality of the thoracic aorta. Specifically, no evidence of aneurysm or dissection. No pathologically enlarged mediastinal or hilar lymph nodes. Importantly, however, there are small filling defects in distal lobar and segmental sized pulmonary artery branches to the lower lobes of the lungs bilaterally, compatible with pulmonary embolism. Small hiatal hernia. No axillary lymphadenopathy. Lungs/Pleura: No suspicious appearing pulmonary nodules or masses. No acute consolidative airspace disease. No pleural effusions. Musculoskeletal/Soft Tissues: There are no aggressive appearing lytic or blastic lesions noted in the visualized portions of the skeleton. CT ABDOMEN AND PELVIS FINDINGS Hepatobiliary: No suspicious appearing cystic or solid hepatic lesions. No intra or extrahepatic biliary ductal dilatation. Gallbladder is normal in appearance. Pancreas: No pancreatic mass. No pancreatic ductal dilatation. No pancreatic or peripancreatic fluid or inflammatory changes. Spleen: Unremarkable. Adrenals/Urinary Tract: Exophytic 2.4 cm low-attenuation lesion in the interpolar region of the  left kidney is compatible with a simple cyst. Right kidney and bilateral adrenal glands are normal in appearance. No hydroureteronephrosis. Urinary bladder is remarkable for multiple areas of amorphous high attenuation (up to 76 HU predominantly in the dependent portion of the urinary bladder). This is mass-like in appearance, and on sagittal images appears to conform to the walls of the urinary bladder. Stomach/Bowel: The appearance of the stomach is normal. There is no pathologic dilatation of small bowel or colon. A few scattered colonic diverticulae are noted, most evident in the descending colon, without surrounding inflammatory changes to suggest an acute diverticulitis at this time. Normal appendix. Vascular/Lymphatic: Atherosclerosis in the abdominal and pelvic vasculature, without evidence of aneurysm or dissection. There is a well-defined filling defect in the right superficial femoral vein extending into the right common femoral vein, concerning for deep venous thrombosis. This is best visualized on axial image 120 of series 2, and coronal image 72 of series 7. This appears to be nonocclusive at this time. Reproductive: Prostate gland and seminal vesicles are unremarkable in appearance. Other: No significant volume of ascites.  No pneumoperitoneum. Musculoskeletal: There are no aggressive appearing lytic or blastic lesions noted in the visualized portions of the skeleton. IMPRESSION: 1. Large amount of high attenuation material in the posterior aspect of the urinary bladder. This may simply represent a large amount of hemorrhage in the lumen of the urinary bladder, however, on sagittal images this appears to conform to the wall of the urinary bladder, which could indicate the presence of a bulky urothelial neoplasm. Urologic consultation is recommended for further evaluation. 2. Nonocclusive pulmonary emboli are noted within distal lobar and segmental sized pulmonary artery branches to the lower lobes of  the lungs bilaterally. 3. Study is  also positive for deep venous thrombosis extending from at least the right superficial femoral vein into the common femoral vein. This appears to be nonocclusive at this time. 4. Mild colonic diverticulosis without evidence of acute diverticulitis at this time. 5. Additional incidental findings, as above. Critical Value/emergent results were called by telephone at the time of interpretation on 09/06/2015 at 2:17 pm to Dr. Charlaine Dalton, who verbally acknowledged these results. Electronically Signed   By: Vinnie Langton M.D.   On: 09/06/2015 14:17   ASSESSMENT AND PLAN:  Frederick Perry is a 60 y.o. male with a known history of Hypertension and diabetes mellitus not taking any medications for more than a year now presents to the Hospital due to ongoing chest pain.  #1 acute on chronic anemia-unknown baseline. Hemoglobin of 4.9. MCV is low to low, possibly chronic anemia. -Stool for occult blood and anemia labs ordered. -Hematology consult appreciated. 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  -Patient came in with hemoglobin of 4.9 he received 2 units of blood transfusion hemoglobin went up to 8.0 drop down to 6.8 he received a third unit of transfusion ---7.4 today -transfuse as needed  #2 acute NSTEMI-Patient does have some chest pain, appears very comfortable. Vitals are stable. EKG with ST depressions in lateral leads. -Patient underwent cardiac catheter was found to have diffuse distal disease. Medical management recommended by cardiology. 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 Monitor sugars. Hasn't been taking any medications for a while.  #5 hematuria workup shows patient has bulky urothelial neoplasm. Urology consultation noted -w/u as out pt  #6 DVT prophylaxis- on lvoenox  #7 hypertension-blood pressure is within normal limits. Will not start on any antihypertensives with his anemia and borderline low BP now   Case discussed with Care Management/Social Worker. Management plans discussed with the patient, family and they are in agreement.  CODE STATUS: Full  TOTAL critical TIME TAKING CARE OF THIS PATIENT:30 minutes.  >50% time spent on counselling and coordination of care with pt and Dr Patrecia Pace   Note: This dictation was prepared with Dragon dictation along with smaller phrase technology. Any transcriptional errors that result from this process are unintentional.  Lawrencia Mauney M.D on 09/07/2015 at 11:22 AM  Between 7am to 6pm - Pager - [phone]  After 6pm go to [email or web address] - password [email or web address]  [facility]  Office  [phone]  CC: Primary care physician; No primary care provider on file.

## 2015-09-07 NOTE — Progress Notes (Signed)
Frederick Perry   DOB:1956-03-16   [id number]    Subjective: Patient status post IVC filter this morning. He denies any pain. Denies any obvious blood in his urine. However he has dark tinge to the urine. Chest pain resolved. No cough.  ROS: No fever no chills. Mild shortness of breath on exertion. Objective:  Filed Vitals:   09/07/15 0915 09/07/15 0959  BP: 110/71 106/69  Pulse: 85 68  Temp:  98.3 F (36.8 C)  Resp: 14 18     Intake/Output Summary (Last 24 hours) at 09/07/15 1338 Last data filed at 09/07/15 1200  Gross per 24 hour  Intake 353.95 ml  Output   1500 ml  Net -1146.05 ml     GENERAL: Well-nourished well-developed; Alert, no distress and comfortable. Alone.  EYES:pallor  OROPHARYNX: no thrush or ulceration; good dentition  NECK: supple, no masses felt LYMPH: no palpable lymphadenopathy in the cervical, axillary or inguinal regions LUNGS: clear to auscultation and No wheeze or crackles HEART/CVS: regular rate & rhythm and no murmurs; No lower extremity edema ABDOMEN: abdomen soft, non-tender and normal bowel sounds Musculoskeletal:no cyanosis of digits and no clubbing  PSYCH: alert & oriented x 3 with fluent speech NEURO: no focal motor/sensory deficits SKIN: no rashes or significant lesions   Labs:  Lab Results  Component Value Date   WBC 18.3* 09/07/2015   HGB 7.4* 09/07/2015   HCT 24.3* 09/07/2015   MCV 71.6* 09/07/2015   PLT 299 09/07/2015   NEUTROABS 10.7* 09/05/2015    Lab Results  Component Value Date   NA 133* 09/07/2015   K 3.6 09/07/2015   CL 104 09/07/2015   CO2 23 09/07/2015    Studies:  Ct Chest W Contrast  09/06/2015  CLINICAL DATA:  60 year old male with history of hypertension and diabetes presenting with recent history of ongoing chest pain. History of congestive heart failure. Smoker. EXAM: CT CHEST, ABDOMEN, AND PELVIS WITH CONTRAST TECHNIQUE: Multidetector CT imaging of the chest, abdomen and pelvis was performed  following the standard protocol during bolus administration of intravenous contrast. CONTRAST:  115mL ISOVUE-300 IOPAMIDOL (ISOVUE-300) INJECTION 61% COMPARISON:  None. FINDINGS: CT CHEST FINDINGS Mediastinum/Lymph Nodes: Heart size is mildly enlarged. There is no significant pericardial fluid, thickening or pericardial calcification. There is atherosclerosis of the thoracic aorta, the great vessels of the mediastinum and the coronary arteries, including calcified atherosclerotic plaque in the left anterior descending and left circumflex coronary arteries. However, there is no acute abnormality of the thoracic aorta. Specifically, no evidence of aneurysm or dissection. No pathologically enlarged mediastinal or hilar lymph nodes. Importantly, however, there are small filling defects in distal lobar and segmental sized pulmonary artery branches to the lower lobes of the lungs bilaterally, compatible with pulmonary embolism. Small hiatal hernia. No axillary lymphadenopathy. Lungs/Pleura: No suspicious appearing pulmonary nodules or masses. No acute consolidative airspace disease. No pleural effusions. Musculoskeletal/Soft Tissues: There are no aggressive appearing lytic or blastic lesions noted in the visualized portions of the skeleton. CT ABDOMEN AND PELVIS FINDINGS Hepatobiliary: No suspicious appearing cystic or solid hepatic lesions. No intra or extrahepatic biliary ductal dilatation. Gallbladder is normal in appearance. Pancreas: No pancreatic mass. No pancreatic ductal dilatation. No pancreatic or peripancreatic fluid or inflammatory changes. Spleen: Unremarkable. Adrenals/Urinary Tract: Exophytic 2.4 cm low-attenuation lesion in the interpolar region of the left kidney is compatible with a simple cyst. Right kidney and bilateral adrenal glands are normal in appearance. No hydroureteronephrosis. Urinary bladder is remarkable for multiple areas of amorphous high  attenuation (up to 76 HU predominantly in the  dependent portion of the urinary bladder). This is mass-like in appearance, and on sagittal images appears to conform to the walls of the urinary bladder. Stomach/Bowel: The appearance of the stomach is normal. There is no pathologic dilatation of small bowel or colon. A few scattered colonic diverticulae are noted, most evident in the descending colon, without surrounding inflammatory changes to suggest an acute diverticulitis at this time. Normal appendix. Vascular/Lymphatic: Atherosclerosis in the abdominal and pelvic vasculature, without evidence of aneurysm or dissection. There is a well-defined filling defect in the right superficial femoral vein extending into the right common femoral vein, concerning for deep venous thrombosis. This is best visualized on axial image 120 of series 2, and coronal image 72 of series 7. This appears to be nonocclusive at this time. Reproductive: Prostate gland and seminal vesicles are unremarkable in appearance. Other: No significant volume of ascites.  No pneumoperitoneum. Musculoskeletal: There are no aggressive appearing lytic or blastic lesions noted in the visualized portions of the skeleton. IMPRESSION: 1. Large amount of high attenuation material in the posterior aspect of the urinary bladder. This may simply represent a large amount of hemorrhage in the lumen of the urinary bladder, however, on sagittal images this appears to conform to the wall of the urinary bladder, which could indicate the presence of a bulky urothelial neoplasm. Urologic consultation is recommended for further evaluation. 2. Nonocclusive pulmonary emboli are noted within distal lobar and segmental sized pulmonary artery branches to the lower lobes of the lungs bilaterally. 3. Study is also positive for deep venous thrombosis extending from at least the right superficial femoral vein into the common femoral vein. This appears to be nonocclusive at this time. 4. Mild colonic diverticulosis without  evidence of acute diverticulitis at this time. 5. Additional incidental findings, as above. Critical Value/emergent results were called by telephone at the time of interpretation on 09/06/2015 at 2:17 pm to Dr. Charlaine Dalton, who verbally acknowledged these results. Electronically Signed   By: Vinnie Langton M.D.   On: 09/06/2015 14:17   Ct Abdomen Pelvis W Contrast  09/06/2015  CLINICAL DATA:  60 year old male with history of hypertension and diabetes presenting with recent history of ongoing chest pain. History of congestive heart failure. Smoker. EXAM: CT CHEST, ABDOMEN, AND PELVIS WITH CONTRAST TECHNIQUE: Multidetector CT imaging of the chest, abdomen and pelvis was performed following the standard protocol during bolus administration of intravenous contrast. CONTRAST:  134mL ISOVUE-300 IOPAMIDOL (ISOVUE-300) INJECTION 61% COMPARISON:  None. FINDINGS: CT CHEST FINDINGS Mediastinum/Lymph Nodes: Heart size is mildly enlarged. There is no significant pericardial fluid, thickening or pericardial calcification. There is atherosclerosis of the thoracic aorta, the great vessels of the mediastinum and the coronary arteries, including calcified atherosclerotic plaque in the left anterior descending and left circumflex coronary arteries. However, there is no acute abnormality of the thoracic aorta. Specifically, no evidence of aneurysm or dissection. No pathologically enlarged mediastinal or hilar lymph nodes. Importantly, however, there are small filling defects in distal lobar and segmental sized pulmonary artery branches to the lower lobes of the lungs bilaterally, compatible with pulmonary embolism. Small hiatal hernia. No axillary lymphadenopathy. Lungs/Pleura: No suspicious appearing pulmonary nodules or masses. No acute consolidative airspace disease. No pleural effusions. Musculoskeletal/Soft Tissues: There are no aggressive appearing lytic or blastic lesions noted in the visualized portions of the  skeleton. CT ABDOMEN AND PELVIS FINDINGS Hepatobiliary: No suspicious appearing cystic or solid hepatic lesions. No intra or extrahepatic biliary ductal  dilatation. Gallbladder is normal in appearance. Pancreas: No pancreatic mass. No pancreatic ductal dilatation. No pancreatic or peripancreatic fluid or inflammatory changes. Spleen: Unremarkable. Adrenals/Urinary Tract: Exophytic 2.4 cm low-attenuation lesion in the interpolar region of the left kidney is compatible with a simple cyst. Right kidney and bilateral adrenal glands are normal in appearance. No hydroureteronephrosis. Urinary bladder is remarkable for multiple areas of amorphous high attenuation (up to 76 HU predominantly in the dependent portion of the urinary bladder). This is mass-like in appearance, and on sagittal images appears to conform to the walls of the urinary bladder. Stomach/Bowel: The appearance of the stomach is normal. There is no pathologic dilatation of small bowel or colon. A few scattered colonic diverticulae are noted, most evident in the descending colon, without surrounding inflammatory changes to suggest an acute diverticulitis at this time. Normal appendix. Vascular/Lymphatic: Atherosclerosis in the abdominal and pelvic vasculature, without evidence of aneurysm or dissection. There is a well-defined filling defect in the right superficial femoral vein extending into the right common femoral vein, concerning for deep venous thrombosis. This is best visualized on axial image 120 of series 2, and coronal image 72 of series 7. This appears to be nonocclusive at this time. Reproductive: Prostate gland and seminal vesicles are unremarkable in appearance. Other: No significant volume of ascites.  No pneumoperitoneum. Musculoskeletal: There are no aggressive appearing lytic or blastic lesions noted in the visualized portions of the skeleton. IMPRESSION: 1. Large amount of high attenuation material in the posterior aspect of the urinary  bladder. This may simply represent a large amount of hemorrhage in the lumen of the urinary bladder, however, on sagittal images this appears to conform to the wall of the urinary bladder, which could indicate the presence of a bulky urothelial neoplasm. Urologic consultation is recommended for further evaluation. 2. Nonocclusive pulmonary emboli are noted within distal lobar and segmental sized pulmonary artery branches to the lower lobes of the lungs bilaterally. 3. Study is also positive for deep venous thrombosis extending from at least the right superficial femoral vein into the common femoral vein. This appears to be nonocclusive at this time. 4. Mild colonic diverticulosis without evidence of acute diverticulitis at this time. 5. Additional incidental findings, as above. Critical Value/emergent results were called by telephone at the time of interpretation on 09/06/2015 at 2:17 pm to Dr. Charlaine Dalton, who verbally acknowledged these results. Electronically Signed   By: Vinnie Langton M.D.   On: 09/06/2015 14:17    Assessment & Plan:   # Severe Anemia- IDA- likely secondary to chronic blood loss from a likely bladder tumor. Recommend IV iron today. Also recommend 2 more units of PRBC. Hemoglobin is around 7.4.  # Bladder tumor- discussed with urology Dr.Budzyn- who feels the patient needs to have cystoscopy/TURBT in the future when the patient is clinically more stable. Given the obvious frank hematuria- given the risk versus benefits- proceed with anticoagulation at this time. However the patient has frank hematuria- he will call urology/or go to the ER. He might need to have an emergent procedure. I ordered a urine cytology.  # DVT/PE- s/p IVC filter.  Bilateral PE subsegmental. Recommend Lovenox 1.5 mg/kg once a day; during high risk of bleeding.  # Non-STEMI status post cardiac cath; no stents placed. Off antiplatelet therapy/as per cardiology.  # The above plan of care was discussed  with the patient in detail. He'll follow-up in the [facility] in 1 week for IV iron infusion; CBC; and follow-up with me  in approximately 2 weeks with IV iron/blood counts.   Discussed with Dr.Patel; Also with Dr.Budzyn.    Cammie Sickle, MD 09/07/2015  1:38 PM

## 2015-09-07 NOTE — Care Management (Signed)
New bladder mass with oncology consult. DVT, Bilateral PE's. IVC filter to be placed.

## 2015-09-07 NOTE — Care Management (Signed)
Ran Lovenox through [facility] program. Gave patient the voucher to take to the pharmacy.

## 2015-09-08 LAB — CBC
HEMATOCRIT: 23.9 % — AB (ref 40.0–52.0)
HEMOGLOBIN: 7.4 g/dL — AB (ref 13.0–18.0)
MCH: 22.2 pg — ABNORMAL LOW (ref 26.0–34.0)
MCHC: 31 g/dL — AB (ref 32.0–36.0)
MCV: 71.4 fL — AB (ref 80.0–100.0)
Platelets: 334 10*3/uL (ref 150–440)
RBC: 3.34 MIL/uL — ABNORMAL LOW (ref 4.40–5.90)
RDW: 26.5 % — ABNORMAL HIGH (ref 11.5–14.5)
WBC: 14.8 10*3/uL — ABNORMAL HIGH (ref 3.8–10.6)

## 2015-09-08 LAB — GLUCOSE, CAPILLARY
Glucose-Capillary: 148 mg/dL — ABNORMAL HIGH (ref 65–99)
Glucose-Capillary: 116 mg/dL — ABNORMAL HIGH (ref 65–99)
Glucose-Capillary: 165 mg/dL — ABNORMAL HIGH (ref 65–99)

## 2015-09-08 LAB — HEMOGLOBIN AND HEMATOCRIT, BLOOD
HEMATOCRIT: 28.9 % — AB (ref 40.0–52.0)
HEMOGLOBIN: 9.3 g/dL — AB (ref 13.0–18.0)

## 2015-09-08 MED ORDER — ATORVASTATIN CALCIUM 40 MG PO TABS: 40.0000 mg | ORAL_TABLET | Freq: Every day | ORAL | Status: DC

## 2015-09-08 MED ORDER — ENOXAPARIN SODIUM 150 MG/ML ~~LOC~~ SOLN: 1.5000 mg/kg | SUBCUTANEOUS | Status: DC

## 2015-09-08 MED ORDER — CARVEDILOL 3.125 MG PO TABS: 3.1250 mg | ORAL_TABLET | Freq: Two times a day (BID) | ORAL | Status: DC

## 2015-09-08 MED ORDER — SODIUM CHLORIDE 0.9 % IV SOLN
Freq: Once | INTRAVENOUS | Status: AC
  Administered 2015-09-08: 11:00:00 via INTRAVENOUS

## 2015-09-08 NOTE — Care Management Note (Addendum)
Case Management Note  Patient Details  Name: Frederick Perry MRN: [id number] Date of Birth: 03-Apr-1956  Subjective/Objective:           Brandi RN reports that she has provided Lovenox self injection teaching to Frederick Perry. Anticipate discharge home after transfusion completed. Frederick Perry was provided with a [facility] program voucher for medication assistance by Orvan July CM yesterday to take to a pharmacy on the [place] list along with his prescriptions after discharge.          Action/Plan:   Expected Discharge Date:                  Expected Discharge Plan:  Home/Self Care  In-House Referral:     Discharge planning Services  CM Consult, Medication Assistance, [place] not met per provider  Post Acute Care Choice:    Choice offered to:     DME Arranged:    DME Agency:     HH Arranged:    HH Agency:     Status of Service:  Completed, signed off  Medicare Important Message Given:    Date Medicare IM Given:    Medicare IM give by:    Date Additional Medicare IM Given:    Additional Medicare Important Message give by:     If discussed at [place] of Stay Meetings, dates discussed:    Additional Comments:  Azaliah Carrero A, RN 09/08/2015, 2:39 PM

## 2015-09-08 NOTE — Progress Notes (Signed)
Blood transfusion complete, H&H order placed per protocol. Patient to be d/c'd if results are stable. Patient resting in bed, no complaints. Vss. Wilnette Kales

## 2015-09-08 NOTE — Progress Notes (Signed)
Patient had 3 beat run of vtach and nonsustain SVT. Dr. Verdell Carmine notified, no new orders at this time. Vss. Wilnette Kales

## 2015-09-08 NOTE — Progress Notes (Addendum)
H&H stable, Dr. Verdell Carmine notified. Patient d/c'd home. Lovenox education provided, no questions at this time. Patient able to preform return demostration successfully. Patient to be picked up by son. Telemetry removed. Wilnette Kales

## 2015-09-09 LAB — TYPE AND SCREEN
ABO/RH(D): O POS
Antibody Screen: NEGATIVE
Unit division: 0
Unit division: 0
Unit division: 0
Unit division: 0
Unit division: 0

## 2015-09-09 LAB — PREPARE RBC (CROSSMATCH)

## 2015-09-09 NOTE — Discharge Summary (Signed)
[facility] NAME: Frederick Perry    MR#:  [id number]  DATE OF BIRTH:  09-Nov-1955  DATE OF ADMISSION:  09/05/2015 ADMITTING PHYSICIAN: Gladstone Lighter, MD  DATE OF DISCHARGE: 09/08/2015  7:00 PM  PRIMARY CARE PHYSICIAN: No primary care provider on file.    ADMISSION DIAGNOSIS:  NSTEMI (non-ST elevated myocardial infarction) ([place]) [I21.4] Ischemic chest pain ([place]) [I20.9] Anemia, unspecified anemia type [D64.9]  DISCHARGE DIAGNOSIS:  Active Problems:   NSTEMI (non-ST elevated myocardial infarction) ([facility])   Absolute anemia   Ischemic chest pain ([place])   Uncontrolled type 2 diabetes mellitus with circulatory disorder ([place])   Hyperlipidemia   Weight loss   SECONDARY DIAGNOSIS:   Past Medical History  Diagnosis Date  . Diabetes mellitus without complication ([place])     Not on medications  . Hypertension     Not on medications  . Polysubstance abuse     a. ongoing tobacco and alcohol abuse     HOSPITAL COURSE:   Frederick Perry is a 60 y.o. male with a known history of Hypertension and diabetes mellitus not taking any medications for more than a year now presents to the Hospital due to ongoing chest pain.  #1 acute on chronic anemia-unknown baseline. Hemoglobin was low as 4.9. This was secondary to his underlying hematuria and from his large urothelial neoplasm. -Patient was transfused multiple units of packed red blood cells and his hemoglobin has improved posttransfusion is around 9.3 at discharge. He continues to have some hematuria which is expected as he has the neoplasm as mentioned above and he is also on anticoagulants for treatment for underlying PE/DVT. -Clinically patient denies any shortness of breath, chest pain.  #2 acute NSTEMI -patient ruled in with cardiac markers on the hospital but is clinically asymptomatic with no chest pain, shortness of breath. He did have some mild ST depressions in lateral  leads. He underwent a cardiac catheterization which showed diffuse disease which was not amenable to intervention. -He was started on aspirin Brilinta but due to his worsening hematuria that was discontinued. -Patient likely will need outpatient cardiology follow-up and for now we'll continue medical management with beta blocker, statin and Lovenox for his underlying DVT PE. -Patient did have echocardiogram which showed normal ejection fraction of 50-55%.  #3 bilateral distal branch PE and right superficial femoral to common femoral DVT -This was probably a provoked thromboembolism due to his underlying malignancy. Patient was initially on a heparin drip but now is being discharged on subcutaneous Lovenox and one and a half milligram per kilogram once daily. This was dosed based on extensive discussions with hematology oncology who will follow him as an outpatient. -Patient was also seen by vascular surgery and is status post IVC filter placement as of 09/07/2015  #4 diabetes mellitus- his BS remained stable while in the hospital and can be further followed as outpatient by his PCP. He will continue his metformin - while in hospital pt. Was on SSI.   #5 hematuria with a urothelial neoplasm-this was noted on a CT scan of abdomen pelvis. Patient was seen by urology and he does need further intervention but when he is medically stable from the cardiac and pulmonary standpoint as he is being treated for a non-ST elevation MI and also a pulmonary embolism/DVT. -Patient will further follow-up with urology as an outpatient.  #6 hypertension-patient is being discharged on low-dose Coreg given his recent and NSTEMI -Presently is hemodynamically stable.  DISCHARGE CONDITIONS:   Stable.   CONSULTS OBTAINED:  Treatment Team:  Minna Merritts, MD Cammie Sickle, MD Nickie Retort, MD Katha Cabal, MD  DRUG ALLERGIES:  No Known Allergies  DISCHARGE MEDICATIONS:   Discharge  Medication List as of 09/08/2015  6:01 PM    START taking these medications   Details  atorvastatin (LIPITOR) 40 MG tablet Take 1 tablet (40 mg total) by mouth daily at 6 PM., Starting 09/08/2015, Until Discontinued, Print    carvedilol (COREG) 3.125 MG tablet Take 1 tablet (3.125 mg total) by mouth 2 (two) times daily with a meal., Starting 09/08/2015, Until Discontinued, Print    enoxaparin (LOVENOX) 150 MG/ML injection Inject 1 mL (150 mg total) into the skin daily., Starting 09/08/2015, Until Discontinued, No Print      CONTINUE these medications which have NOT CHANGED   Details  metFORMIN (GLUCOPHAGE) 500 MG tablet Take 500 mg by mouth 2 (two) times daily with a meal. Reported on 09/05/2015, Until Discontinued, Historical Med      STOP taking these medications     aspirin EC 81 MG tablet          DISCHARGE INSTRUCTIONS:   DIET:  Cardiac diet and Diabetic diet  DISCHARGE CONDITION:  Stable  ACTIVITY:  Activity as tolerated  OXYGEN:  Home Oxygen: No.   Oxygen Delivery: room air  DISCHARGE LOCATION:  home   If you experience worsening of your admission symptoms, develop shortness of breath, life threatening emergency, suicidal or homicidal thoughts you must seek medical attention immediately by calling 911 or calling your MD immediately  if symptoms less severe.  You Must read complete instructions/literature along with all the possible adverse reactions/side effects for all the Medicines you take and that have been prescribed to you. Take any new Medicines after you have completely understood and accpet all the possible adverse reactions/side effects.   Please note  You were cared for by a hospitalist during your hospital stay. If you have any questions about your discharge medications or the care you received while you were in the hospital after you are discharged, you can call the unit and asked to speak with the hospitalist on call if the hospitalist that took care  of you is not available. Once you are discharged, your primary care physician will handle any further medical issues. Please note that NO REFILLS for any discharge medications will be authorized once you are discharged, as it is imperative that you return to your primary care physician (or establish a relationship with a primary care physician if you do not have one) for your aftercare needs so that they can reassess your need for medications and monitor your lab values.     Today   No acute complaints presently. No chest pain, shortness of breath, abdominal pain, fever, chills  VITAL SIGNS:  Blood pressure 111/71, pulse 89, temperature 98.7 F (37.1 C), temperature source Oral, resp. rate 18, height 6\' 2"  (1.88 m), weight 100.1 kg (220 lb 10.9 oz), SpO2 98 %.  I/O:   Intake/Output Summary (Last 24 hours) at 09/09/15 1555 Last data filed at 09/08/15 1813  Gross per 24 hour  Intake    320 ml  Output      0 ml  Net    320 ml    PHYSICAL EXAMINATION:  GENERAL:  60 y.o.-year-old patient lying in the bed with no acute distress.  EYES: Pupils equal, round, reactive to light and accommodation. No scleral  icterus. Extraocular muscles intact.  HEENT: Head atraumatic, normocephalic. Oropharynx and nasopharynx clear.  NECK:  Supple, no jugular venous distention. No thyroid enlargement, no tenderness.  LUNGS: Normal breath sounds bilaterally, no wheezing, rales,rhonchi. No use of accessory muscles of respiration.  CARDIOVASCULAR: S1, S2 normal. No murmurs, rubs, or gallops.  ABDOMEN: Soft, non-tender, non-distended. Bowel sounds present. No organomegaly or mass.  EXTREMITIES: No pedal edema, cyanosis, or clubbing.  NEUROLOGIC: Cranial nerves II through XII are intact. No focal motor or sensory defecits b/l.  PSYCHIATRIC: The patient is alert and oriented x 3. Good affect.  SKIN: No obvious rash, lesion, or ulcer.   DATA REVIEW:   CBC  Recent Labs Lab 09/08/15 0445 09/08/15 1717  WBC  14.8*  --   HGB 7.4* 9.3*  HCT 23.9* 28.9*  PLT 334  --     Chemistries   Recent Labs Lab 09/05/15 0735  09/07/15 0510  NA 134*  < > 133*  K 3.8  < > 3.6  CL 102  < > 104  CO2 23  < > 23  GLUCOSE 193*  < > 116*  BUN 14  < > 13  CREATININE 1.13  < > 0.96  CALCIUM 8.4*  < > 8.0*  AST 176*  --   --   ALT 24  --   --   ALKPHOS 70  --   --   BILITOT 1.0  --   --   < > = values in this interval not displayed.  Cardiac Enzymes  Recent Labs Lab 09/05/15 2307  TROPONINI 16.10*    Microbiology Results  No results found for this or any previous visit.  RADIOLOGY:  No results found.    Management plans discussed with the patient, family and they are in agreement.  CODE STATUS:  Code Status History    Date Active Date Inactive Code Status Order ID Comments User Context   09/05/2015  4:11 PM 09/08/2015 10:01 PM Full Code [id number]  Gladstone Lighter, MD Inpatient   09/05/2015  4:10 PM 09/05/2015  4:11 PM Full Code [id number]  Minna Merritts, MD Inpatient      TOTAL TIME TAKING CARE OF THIS PATIENT: 40 minutes.    Henreitta Leber M.D on 09/09/2015 at 3:55 PM  Between 7am to 6pm - Pager - [phone]  After 6pm go to [email or web address] - password [email or web address] [email or web address]  [facility]  Office  [phone]  CC: Primary care physician; No primary care provider on file.

## 2015-09-10 NOTE — Telephone Encounter (Signed)
I have made him an appt for 09-27-15 @ 10:30 with dr. Pilar Jarvis and called and had to leave a message on the pt's VM for him to CB to give him the appt information.   Thanks, Sharyn Lull

## 2015-09-12 ENCOUNTER — Other Ambulatory Visit: Payer: Self-pay | Admitting: *Deleted

## 2015-09-12 DIAGNOSIS — D509 Iron deficiency anemia, unspecified: Secondary | ICD-10-CM

## 2015-09-14 ENCOUNTER — Inpatient Hospital Stay: Payer: Self-pay | Attending: Internal Medicine

## 2015-09-14 ENCOUNTER — Inpatient Hospital Stay: Payer: Self-pay

## 2015-09-14 VITALS — BP 117/77 | HR 78 | Temp 98.1°F | Resp 18

## 2015-09-14 DIAGNOSIS — D509 Iron deficiency anemia, unspecified: Secondary | ICD-10-CM | POA: Insufficient documentation

## 2015-09-14 DIAGNOSIS — D500 Iron deficiency anemia secondary to blood loss (chronic): Secondary | ICD-10-CM

## 2015-09-14 LAB — CBC WITH DIFFERENTIAL/PLATELET
BASOS ABS: 0.1 10*3/uL (ref 0–0.1)
BASOS PCT: 1 %
Eosinophils Absolute: 0.1 10*3/uL (ref 0–0.7)
Eosinophils Relative: 1 %
HEMATOCRIT: 34.5 % — AB (ref 40.0–52.0)
HEMOGLOBIN: 10.9 g/dL — AB (ref 13.0–18.0)
LYMPHS PCT: 15 %
Lymphs Abs: 1.7 10*3/uL (ref 1.0–3.6)
MCH: 25.4 pg — ABNORMAL LOW (ref 26.0–34.0)
MCHC: 31.6 g/dL — ABNORMAL LOW (ref 32.0–36.0)
MCV: 80.5 fL (ref 80.0–100.0)
Monocytes Absolute: 0.8 10*3/uL (ref 0.2–1.0)
Monocytes Relative: 7 %
NEUTROS ABS: 8.3 10*3/uL — AB (ref 1.4–6.5)
NEUTROS PCT: 76 %
Platelets: 428 10*3/uL (ref 150–440)
RBC: 4.28 MIL/uL — AB (ref 4.40–5.90)
RDW: 28.7 % — ABNORMAL HIGH (ref 11.5–14.5)
WBC: 10.9 10*3/uL — AB (ref 3.8–10.6)

## 2015-09-14 LAB — SAMPLE TO BLOOD BANK

## 2015-09-14 MED ORDER — SODIUM CHLORIDE 0.9 % IV SOLN
Freq: Once | INTRAVENOUS | Status: AC
  Administered 2015-09-14: 14:00:00 via INTRAVENOUS
  Filled 2015-09-14: qty 1000

## 2015-09-14 MED ORDER — SODIUM CHLORIDE 0.9 % IV SOLN
510.0000 mg | Freq: Once | INTRAVENOUS | Status: AC
Start: 2015-09-14 — End: 2015-09-14
  Administered 2015-09-14: 510 mg via INTRAVENOUS
  Filled 2015-09-14: qty 17

## 2015-09-16 ENCOUNTER — Encounter: Payer: Self-pay | Admitting: Vascular Surgery

## 2015-09-17 ENCOUNTER — Other Ambulatory Visit: Payer: Self-pay | Admitting: Internal Medicine

## 2015-09-21 ENCOUNTER — Inpatient Hospital Stay: Payer: Self-pay

## 2015-09-21 ENCOUNTER — Inpatient Hospital Stay: Payer: Self-pay | Admitting: Internal Medicine

## 2015-09-26 ENCOUNTER — Inpatient Hospital Stay: Payer: Medicaid Other | Attending: Internal Medicine

## 2015-09-26 ENCOUNTER — Inpatient Hospital Stay (HOSPITAL_BASED_OUTPATIENT_CLINIC_OR_DEPARTMENT_OTHER): Payer: Medicaid Other | Admitting: Internal Medicine

## 2015-09-26 ENCOUNTER — Inpatient Hospital Stay: Payer: Medicaid Other

## 2015-09-26 VITALS — BP 146/78 | HR 84 | Temp 97.7°F | Resp 18 | Wt 213.2 lb

## 2015-09-26 VITALS — BP 137/81 | HR 74 | Resp 20

## 2015-09-26 DIAGNOSIS — Z7901 Long term (current) use of anticoagulants: Secondary | ICD-10-CM | POA: Insufficient documentation

## 2015-09-26 DIAGNOSIS — E119 Type 2 diabetes mellitus without complications: Secondary | ICD-10-CM | POA: Diagnosis not present

## 2015-09-26 DIAGNOSIS — D509 Iron deficiency anemia, unspecified: Secondary | ICD-10-CM

## 2015-09-26 DIAGNOSIS — F17210 Nicotine dependence, cigarettes, uncomplicated: Secondary | ICD-10-CM | POA: Insufficient documentation

## 2015-09-26 DIAGNOSIS — I82409 Acute embolism and thrombosis of unspecified deep veins of unspecified lower extremity: Secondary | ICD-10-CM | POA: Insufficient documentation

## 2015-09-26 DIAGNOSIS — I10 Essential (primary) hypertension: Secondary | ICD-10-CM | POA: Diagnosis not present

## 2015-09-26 DIAGNOSIS — Z79899 Other long term (current) drug therapy: Secondary | ICD-10-CM | POA: Diagnosis not present

## 2015-09-26 DIAGNOSIS — R319 Hematuria, unspecified: Secondary | ICD-10-CM | POA: Diagnosis not present

## 2015-09-26 DIAGNOSIS — F1010 Alcohol abuse, uncomplicated: Secondary | ICD-10-CM | POA: Diagnosis not present

## 2015-09-26 DIAGNOSIS — N329 Bladder disorder, unspecified: Secondary | ICD-10-CM | POA: Insufficient documentation

## 2015-09-26 DIAGNOSIS — D500 Iron deficiency anemia secondary to blood loss (chronic): Secondary | ICD-10-CM | POA: Diagnosis present

## 2015-09-26 DIAGNOSIS — I2699 Other pulmonary embolism without acute cor pulmonale: Secondary | ICD-10-CM | POA: Diagnosis not present

## 2015-09-26 LAB — CBC WITH DIFFERENTIAL/PLATELET
BASOS ABS: 0.1 10*3/uL (ref 0–0.1)
Basophils Relative: 2 %
EOS ABS: 0.4 10*3/uL (ref 0–0.7)
Eosinophils Relative: 7 %
HCT: 38 % — ABNORMAL LOW (ref 40.0–52.0)
Hemoglobin: 12.3 g/dL — ABNORMAL LOW (ref 13.0–18.0)
LYMPHS ABS: 1.4 10*3/uL (ref 1.0–3.6)
Lymphocytes Relative: 23 %
MCH: 27.2 pg (ref 26.0–34.0)
MCHC: 32.3 g/dL (ref 32.0–36.0)
MCV: 84.2 fL (ref 80.0–100.0)
MONO ABS: 0.5 10*3/uL (ref 0.2–1.0)
Monocytes Relative: 8 %
Neutro Abs: 3.7 10*3/uL (ref 1.4–6.5)
Neutrophils Relative %: 60 %
PLATELETS: 416 10*3/uL (ref 150–440)
RBC: 4.52 MIL/uL (ref 4.40–5.90)
RDW: 28.8 % — AB (ref 11.5–14.5)
WBC: 6.1 10*3/uL (ref 3.8–10.6)

## 2015-09-26 LAB — SAMPLE TO BLOOD BANK

## 2015-09-26 MED ORDER — SODIUM CHLORIDE 0.9 % IV SOLN
510.0000 mg | Freq: Once | INTRAVENOUS | Status: AC
  Administered 2015-09-26: 510 mg via INTRAVENOUS
  Filled 2015-09-26: qty 17

## 2015-09-26 MED ORDER — SODIUM CHLORIDE 0.9 % IV SOLN
Freq: Once | INTRAVENOUS | Status: AC
  Administered 2015-09-26: 10:00:00 via INTRAVENOUS
  Filled 2015-09-26: qty 1000

## 2015-09-26 NOTE — Progress Notes (Signed)
[facility] OFFICE PROGRESS NOTE  Patient Care Team: No Pcp Per Patient as PCP - General (General Practice)   SUMMARY OF ONCOLOGIC HISTORY:  # March 2017 SEVERE IRON DEFICIENCY ANEMIA sec to hematuria [Hb-4.9]  # March 2017 BLADDER MASS [Dr.Budzyn]  # bil DVT/PE  S/p IVC filter- on LOvenox [march 23rd 2017]  #NSTEMI-CAD s/p cath [ March 2017no stents; Dr.Gollum]  INTERVAL HISTORY:  60 year old African-American male patient was recently seen in the hospital for severe iron deficiency anemia; further workup showed a bladder mass on the CT scan; most likely reason of his iron deficiency anemia. The workup also showed patient had a DVT PE for which she had an IVC filter placed; also started on Lovenox once a day injections. Patient received IV iron as an outpatient basis. Patient is here for follow-up.  Patient continues to have mild blood in urine pinkish tinge;  no gross blood. Denies any chest pain. Denies any shortness of breath no nausea no vomiting. No abdominal pain.  REVIEW OF SYSTEMS:  A complete 10 point review of system is done which is negative except mentioned above/history of present illness.   PAST MEDICAL HISTORY :  Past Medical History  Diagnosis Date  . Diabetes mellitus without complication ([place])     Not on medications  . Hypertension     Not on medications  . Polysubstance abuse     a. ongoing tobacco and alcohol abuse     PAST SURGICAL HISTORY :   Past Surgical History  Procedure Laterality Date  . Back surgery    . Patellar tendon repair    . Testicular torsion repair    . Cardiac catheterization N/A 09/05/2015    Procedure: Left Heart Cath and Coronary Angiography;  Surgeon: Minna Merritts, MD;  Location: [facility];  Service: Cardiovascular;  Laterality: N/A;  . Peripheral vascular catheterization N/A 09/07/2015    Procedure: IVC Filter Insertion;  Surgeon: Algernon Huxley, MD;  Location: [facility];  Service:  Cardiovascular;  Laterality: N/A;    FAMILY HISTORY :   Family History  Problem Relation Age of Onset  . Congestive Heart Failure Mother   . Peripheral vascular disease Father     SOCIAL HISTORY:   Social History  Substance Use Topics  . Smoking status: Current Every Day Smoker -- 1.00 packs/day    Types: Cigarettes  . Smokeless tobacco: Not on file  . Alcohol Use: 12.6 oz/week    21 Cans of beer per week     Comment: daily- beers and occasionally liquor    ALLERGIES:  has No Known Allergies.  MEDICATIONS:  Current Outpatient Prescriptions  Medication Sig Dispense Refill  . atorvastatin (LIPITOR) 40 MG tablet Take 1 tablet (40 mg total) by mouth daily at 6 PM. 30 tablet 1  . carvedilol (COREG) 3.125 MG tablet Take 1 tablet (3.125 mg total) by mouth 2 (two) times daily with a meal. 60 tablet 1  . enoxaparin (LOVENOX) 150 MG/ML injection Inject 1 mL (150 mg total) into the skin daily. 0 Syringe    No current facility-administered medications for this visit.    PHYSICAL EXAMINATION: ECOG PERFORMANCE STATUS: 0 - Asymptomatic  BP 146/78 mmHg  Pulse 84  Temp(Src) 97.7 F (36.5 C) (Tympanic)  Resp 18  Wt 213 lb 3 oz (96.7 kg)  Filed Weights   09/26/15 0908  Weight: 213 lb 3 oz (96.7 kg)    GENERAL: Well-nourished well-developed; Alert, no distress and  comfortable.   Pale; alone. EYES: no pallor or icterus OROPHARYNX: no thrush or ulceration; good dentition  NECK: supple, no masses felt LYMPH:  no palpable lymphadenopathy in the cervical, axillary or inguinal regions LUNGS: clear to auscultation and  No wheeze or crackles HEART/CVS: regular rate & rhythm and no murmurs; No lower extremity edema ABDOMEN:abdomen soft, non-tender and normal bowel sounds Musculoskeletal:no cyanosis of digits and no clubbing  PSYCH: alert & oriented x 3 with fluent speech NEURO: no focal motor/sensory deficits SKIN:  no rashes or significant lesions  LABORATORY DATA:  I have reviewed  the data as listed    Component Value Date/Time   NA 133* 09/07/2015 0510   K 3.6 09/07/2015 0510   CL 104 09/07/2015 0510   CO2 23 09/07/2015 0510   GLUCOSE 116* 09/07/2015 0510   BUN 13 09/07/2015 0510   CREATININE 0.96 09/07/2015 0510   CALCIUM 8.0* 09/07/2015 0510   PROT 7.3 09/05/2015 0735   ALBUMIN 3.4* 09/05/2015 0735   AST 176* 09/05/2015 0735   ALT 24 09/05/2015 0735   ALKPHOS 70 09/05/2015 0735   BILITOT 1.0 09/05/2015 0735   GFRNONAA >60 09/07/2015 0510   GFRAA >60 09/07/2015 0510    No results found for: SPEP, UPEP  Lab Results  Component Value Date   WBC 10.9* 09/14/2015   NEUTROABS 8.3* 09/14/2015   HGB 10.9* 09/14/2015   HCT 34.5* 09/14/2015   MCV 80.5 09/14/2015   PLT 428 09/14/2015      Chemistry      Component Value Date/Time   NA 133* 09/07/2015 0510   K 3.6 09/07/2015 0510   CL 104 09/07/2015 0510   CO2 23 09/07/2015 0510   BUN 13 09/07/2015 0510   CREATININE 0.96 09/07/2015 0510      Component Value Date/Time   CALCIUM 8.0* 09/07/2015 0510   ALKPHOS 70 09/05/2015 0735   AST 176* 09/05/2015 0735   ALT 24 09/05/2015 0735   BILITOT 1.0 09/05/2015 0735         ASSESSMENT & PLAN:   # SEVERE IRON DEFICIENCY ANEMIA- Secondary to hematuria-hemoglobin- improved from 4.8 to 10.  Status post IV iron 1 so for; plan for IV iron today. We will repeat again in 3 weeks.  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 No evidence of any pelvic metastases/distant metastasis.  # bil DVT/PE  S/p IVC filter- on LOvenox. Patient will continue Lovenox for 3 more weeks/ a total of around 6 weeks. And then plan to take him off Lovenox/next visit; for having a biopsy of the bladder mass. This was discussed at length with the patient. He was asked to make an appointment with Dr. Pilar Jarvis in 3-4 weeks in anticipation of bladder biopsy.  I had spoken to Dr. Pilar Jarvis re: the patient.   # Patient will follow-up with me  in 3 weeks IV iron infusion.     Cammie Sickle, MD 09/26/2015 9:17 AM

## 2015-09-27 ENCOUNTER — Ambulatory Visit: Payer: Self-pay

## 2015-10-01 ENCOUNTER — Ambulatory Visit: Payer: Self-pay

## 2015-10-03 ENCOUNTER — Inpatient Hospital Stay: Payer: Medicaid Other

## 2015-10-10 ENCOUNTER — Inpatient Hospital Stay: Payer: Medicaid Other

## 2015-10-15 ENCOUNTER — Inpatient Hospital Stay: Payer: Self-pay | Attending: Internal Medicine

## 2015-10-17 ENCOUNTER — Inpatient Hospital Stay: Payer: Self-pay

## 2015-10-17 ENCOUNTER — Inpatient Hospital Stay: Payer: Self-pay | Admitting: Internal Medicine

## 2015-10-25 ENCOUNTER — Inpatient Hospital Stay: Payer: Self-pay

## 2015-10-25 ENCOUNTER — Inpatient Hospital Stay: Payer: Self-pay | Admitting: Internal Medicine

## 2015-10-25 ENCOUNTER — Encounter: Payer: Self-pay | Admitting: *Deleted

## 2015-11-01 ENCOUNTER — Telehealth: Payer: Self-pay

## 2015-11-01 NOTE — Telephone Encounter (Signed)
  Oncology Nurse Navigator Documentation  Navigator Location: [facility] (11/01/15 1400) Navigator Encounter Type: Telephone (11/01/15 1400) Telephone: Outgoing Call;Patient Update (11/01/15 1400)                                        Time Spent with Patient: 15 (11/01/15 1400)   Mr Frederick Perry has been a no show for his last appts. Voicemail left for him to return call to check on his condition and possibly reschedule

## 2015-11-06 ENCOUNTER — Telehealth: Payer: Self-pay

## 2015-11-06 NOTE — Telephone Encounter (Signed)
  Oncology Nurse Navigator Documentation  Navigator Location: [facility] (11/06/15 0900) Navigator Encounter Type: Telephone (11/06/15 0900) Telephone: Lahoma Crocker Call (11/06/15 0900)                                        Time Spent with Patient: 15 (11/06/15 0900)   Voicemail left for patient to return call. Needs appt with Dr Rogue Bussing for follow up for IDA.

## 2015-11-22 ENCOUNTER — Telehealth: Payer: Self-pay

## 2015-11-22 NOTE — Telephone Encounter (Signed)
  Oncology Nurse Navigator Documentation  Navigator Location: [facility] (11/22/15 1000) Navigator Encounter Type: Telephone;Follow-up Appt (11/22/15 1000) Telephone: Outgoing Call (11/22/15 1000)                                        Time Spent with Patient: 15 (11/22/15 1000)   Attempt made to contact Frederick Perry regarding follow up for anemia and bladder mass. Voicemail full and was unable to leave message. Attempt made to listed emergency contact Frederick Perry (mother). No answer and no voicemail available to leave message.

## 2015-11-22 NOTE — Telephone Encounter (Signed)
  Oncology Nurse Navigator Documentation  Navigator Location: [facility] (11/22/15 1000) Navigator Encounter Type: Telephone (11/22/15 1000) Telephone: Outgoing Call (11/22/15 1000)                                        Time Spent with Patient: 15 (11/22/15 1000)   Attempted to call Frederick Perry regarding follow up. His voicemail is full and will not accept messages. Attempted to contact listed emergency contact Hetty Ely (mother) and no voicemail was available.

## 2015-11-28 ENCOUNTER — Encounter: Payer: Self-pay | Admitting: *Deleted

## 2015-11-28 NOTE — Progress Notes (Signed)
Multiple attempts have been made to contact patient.  Pt was missed multiple appointments in cancer center with oncologist.  Final Discharge letter will be sent certified mailed to patient per v/o Dr. Rogue Bussing.

## 2015-12-14 ENCOUNTER — Encounter
Admission: EM | Disposition: A | Discharge status: Home / Self Care | Payer: Self-pay | Source: Home / Self Care | Attending: Surgery

## 2015-12-14 ENCOUNTER — Emergency Department: Payer: Medicaid Other | Admitting: Anesthesiology

## 2015-12-14 ENCOUNTER — Emergency Department: Payer: Medicaid Other

## 2015-12-14 ENCOUNTER — Inpatient Hospital Stay: Payer: Medicaid Other

## 2015-12-14 ENCOUNTER — Inpatient Hospital Stay
Admission: EM | Admit: 2015-12-14 | Discharge: 2015-12-22 | DRG: 853 | Disposition: A | Discharge status: Home / Self Care | Payer: Medicaid Other | Attending: Surgery | Admitting: Surgery

## 2015-12-14 DIAGNOSIS — Z95828 Presence of other vascular implants and grafts: Secondary | ICD-10-CM

## 2015-12-14 DIAGNOSIS — R6521 Severe sepsis with septic shock: Secondary | ICD-10-CM | POA: Diagnosis present

## 2015-12-14 DIAGNOSIS — J9601 Acute respiratory failure with hypoxia: Secondary | ICD-10-CM | POA: Diagnosis present

## 2015-12-14 DIAGNOSIS — D62 Acute posthemorrhagic anemia: Secondary | ICD-10-CM | POA: Diagnosis present

## 2015-12-14 DIAGNOSIS — I2510 Atherosclerotic heart disease of native coronary artery without angina pectoris: Secondary | ICD-10-CM | POA: Diagnosis present

## 2015-12-14 DIAGNOSIS — E119 Type 2 diabetes mellitus without complications: Secondary | ICD-10-CM | POA: Diagnosis present

## 2015-12-14 DIAGNOSIS — K256 Chronic or unspecified gastric ulcer with both hemorrhage and perforation: Secondary | ICD-10-CM | POA: Diagnosis present

## 2015-12-14 DIAGNOSIS — I252 Old myocardial infarction: Secondary | ICD-10-CM

## 2015-12-14 DIAGNOSIS — R0600 Dyspnea, unspecified: Secondary | ICD-10-CM

## 2015-12-14 DIAGNOSIS — R109 Unspecified abdominal pain: Secondary | ICD-10-CM

## 2015-12-14 DIAGNOSIS — R509 Fever, unspecified: Secondary | ICD-10-CM

## 2015-12-14 DIAGNOSIS — A419 Sepsis, unspecified organism: Secondary | ICD-10-CM | POA: Diagnosis present

## 2015-12-14 DIAGNOSIS — F1010 Alcohol abuse, uncomplicated: Secondary | ICD-10-CM | POA: Diagnosis present

## 2015-12-14 DIAGNOSIS — I2699 Other pulmonary embolism without acute cor pulmonale: Secondary | ICD-10-CM

## 2015-12-14 DIAGNOSIS — Y929 Unspecified place or not applicable: Secondary | ICD-10-CM

## 2015-12-14 DIAGNOSIS — R579 Shock, unspecified: Secondary | ICD-10-CM

## 2015-12-14 DIAGNOSIS — D494 Neoplasm of unspecified behavior of bladder: Secondary | ICD-10-CM

## 2015-12-14 DIAGNOSIS — R600 Localized edema: Secondary | ICD-10-CM

## 2015-12-14 DIAGNOSIS — R7989 Other specified abnormal findings of blood chemistry: Secondary | ICD-10-CM

## 2015-12-14 DIAGNOSIS — C679 Malignant neoplasm of bladder, unspecified: Secondary | ICD-10-CM | POA: Diagnosis present

## 2015-12-14 DIAGNOSIS — N179 Acute kidney failure, unspecified: Secondary | ICD-10-CM | POA: Diagnosis present

## 2015-12-14 DIAGNOSIS — I82441 Acute embolism and thrombosis of right tibial vein: Secondary | ICD-10-CM | POA: Diagnosis present

## 2015-12-14 DIAGNOSIS — J9811 Atelectasis: Secondary | ICD-10-CM | POA: Diagnosis not present

## 2015-12-14 DIAGNOSIS — F17210 Nicotine dependence, cigarettes, uncomplicated: Secondary | ICD-10-CM | POA: Diagnosis present

## 2015-12-14 DIAGNOSIS — Z86711 Personal history of pulmonary embolism: Secondary | ICD-10-CM

## 2015-12-14 DIAGNOSIS — I10 Essential (primary) hypertension: Secondary | ICD-10-CM | POA: Diagnosis present

## 2015-12-14 DIAGNOSIS — R778 Other specified abnormalities of plasma proteins: Secondary | ICD-10-CM

## 2015-12-14 DIAGNOSIS — R310 Gross hematuria: Secondary | ICD-10-CM | POA: Diagnosis present

## 2015-12-14 DIAGNOSIS — K631 Perforation of intestine (nontraumatic): Secondary | ICD-10-CM | POA: Insufficient documentation

## 2015-12-14 DIAGNOSIS — Z7901 Long term (current) use of anticoagulants: Secondary | ICD-10-CM | POA: Diagnosis not present

## 2015-12-14 DIAGNOSIS — T39395A Adverse effect of other nonsteroidal anti-inflammatory drugs [NSAID], initial encounter: Secondary | ICD-10-CM | POA: Diagnosis present

## 2015-12-14 DIAGNOSIS — I2609 Other pulmonary embolism with acute cor pulmonale: Secondary | ICD-10-CM

## 2015-12-14 DIAGNOSIS — K255 Chronic or unspecified gastric ulcer with perforation: Secondary | ICD-10-CM | POA: Diagnosis present

## 2015-12-14 HISTORY — PX: LAPAROTOMY: SHX154

## 2015-12-14 LAB — BLOOD GAS, ARTERIAL
ACID-BASE DEFICIT: 3.4 mmol/L — AB (ref 0.0–2.0)
Allens test (pass/fail): POSITIVE — AB
BICARBONATE: 23.1 meq/L (ref 21.0–28.0)
FIO2: 0.35
MECHVT: 500 mL
O2 Saturation: 83.4 %
PATIENT TEMPERATURE: 37
PEEP/CPAP: 5 cmH2O
PH ART: 7.29 — AB (ref 7.350–7.450)
PO2 ART: 54 mmHg — AB (ref 83.0–108.0)
RATE: 15 resp/min
pCO2 arterial: 48 mmHg (ref 32.0–48.0)

## 2015-12-14 LAB — GLUCOSE, CAPILLARY
GLUCOSE-CAPILLARY: 157 mg/dL — AB (ref 65–99)
GLUCOSE-CAPILLARY: 171 mg/dL — AB (ref 65–99)
GLUCOSE-CAPILLARY: 194 mg/dL — AB (ref 65–99)
GLUCOSE-CAPILLARY: 206 mg/dL — AB (ref 65–99)
Glucose-Capillary: 221 mg/dL — ABNORMAL HIGH (ref 65–99)

## 2015-12-14 LAB — CBC
HCT: 16.1 % — ABNORMAL LOW (ref 40.0–52.0)
HCT: 25.6 % — ABNORMAL LOW (ref 40.0–52.0)
Hemoglobin: 4.7 g/dL — CL (ref 13.0–18.0)
Hemoglobin: 8.6 g/dL — ABNORMAL LOW (ref 13.0–18.0)
MCH: 24.5 pg — AB (ref 26.0–34.0)
MCH: 28.4 pg (ref 26.0–34.0)
MCHC: 29.5 g/dL — ABNORMAL LOW (ref 32.0–36.0)
MCHC: 33.4 g/dL (ref 32.0–36.0)
MCV: 82.9 fL (ref 80.0–100.0)
MCV: 85.1 fL (ref 80.0–100.0)
PLATELETS: 367 10*3/uL (ref 150–440)
PLATELETS: 244 10*3/uL (ref 150–440)
RBC: 1.94 MIL/uL — ABNORMAL LOW (ref 4.40–5.90)
RBC: 3.01 MIL/uL — AB (ref 4.40–5.90)
RDW: 21.5 % — AB (ref 11.5–14.5)
RDW: 17.2 % — ABNORMAL HIGH (ref 11.5–14.5)
WBC: 14.7 10*3/uL — AB (ref 3.8–10.6)
WBC: 8.4 10*3/uL (ref 3.8–10.6)

## 2015-12-14 LAB — COMPREHENSIVE METABOLIC PANEL
ALK PHOS: 74 U/L (ref 38–126)
ALT: 8 U/L — AB (ref 17–63)
AST: 24 U/L (ref 15–41)
Albumin: 3.3 g/dL — ABNORMAL LOW (ref 3.5–5.0)
Anion gap: 16 — ABNORMAL HIGH (ref 5–15)
BUN: 15 mg/dL (ref 6–20)
CHLORIDE: 103 mmol/L (ref 101–111)
CO2: 17 mmol/L — ABNORMAL LOW (ref 22–32)
CREATININE: 1.39 mg/dL — AB (ref 0.61–1.24)
Calcium: 8.5 mg/dL — ABNORMAL LOW (ref 8.9–10.3)
GFR calc Af Amer: >60 mL/min (ref >60)
GFR, EST NON AFRICAN AMERICAN: 54 mL/min — AB (ref >60)
Glucose, Bld: 213 mg/dL — ABNORMAL HIGH (ref 65–99)
Potassium: 3.5 mmol/L (ref 3.5–5.1)
Sodium: 136 mmol/L (ref 135–145)
Total Bilirubin: 0.3 mg/dL (ref 0.3–1.2)
Total Protein: 6.9 g/dL (ref 6.5–8.1)

## 2015-12-14 LAB — TROPONIN I: Troponin I: 0.07 ng/mL (ref <0.03)

## 2015-12-14 LAB — BASIC METABOLIC PANEL
Anion gap: 8 (ref 5–15)
BUN: 16 mg/dL (ref 6–20)
CALCIUM: 7.7 mg/dL — AB (ref 8.9–10.3)
CO2: 23 mmol/L (ref 22–32)
CREATININE: 1.51 mg/dL — AB (ref 0.61–1.24)
Chloride: 106 mmol/L (ref 101–111)
GFR calc non Af Amer: 49 mL/min — ABNORMAL LOW (ref >60)
GFR, EST AFRICAN AMERICAN: 57 mL/min — AB (ref >60)
Glucose, Bld: 179 mg/dL — ABNORMAL HIGH (ref 65–99)
Potassium: 4.2 mmol/L (ref 3.5–5.1)
Sodium: 137 mmol/L (ref 135–145)

## 2015-12-14 LAB — PREPARE RBC (CROSSMATCH)

## 2015-12-14 LAB — PROTIME-INR
INR: 1.23
Prothrombin Time: 15.7 seconds — ABNORMAL HIGH (ref 11.4–15.0)

## 2015-12-14 LAB — HEMOGLOBIN AND HEMATOCRIT, BLOOD
HCT: 23.4 % — ABNORMAL LOW (ref 40.0–52.0)
Hemoglobin: 7.6 g/dL — ABNORMAL LOW (ref 13.0–18.0)

## 2015-12-14 LAB — TRIGLYCERIDES: Triglycerides: 146 mg/dL (ref <150)

## 2015-12-14 LAB — LIPASE, BLOOD: LIPASE: 44 U/L (ref 11–51)

## 2015-12-14 LAB — MAGNESIUM: MAGNESIUM: 1.6 mg/dL — AB (ref 1.7–2.4)

## 2015-12-14 LAB — PHOSPHORUS: Phosphorus: 3.8 mg/dL (ref 2.5–4.6)

## 2015-12-14 LAB — LACTIC ACID, PLASMA: Lactic Acid, Venous: 9.5 mmol/L (ref 0.5–1.9)

## 2015-12-14 LAB — APTT: aPTT: 28 seconds (ref 24–36)

## 2015-12-14 SURGERY — LAPAROTOMY, EXPLORATORY
Anesthesia: General | Wound class: Contaminated

## 2015-12-14 MED ORDER — PROPOFOL 10 MG/ML IV BOLUS
INTRAVENOUS | Status: DC | PRN
  Administered 2015-12-14: 100 mg via INTRAVENOUS

## 2015-12-14 MED ORDER — LACTATED RINGERS IV SOLN
INTRAVENOUS | Status: DC | PRN
  Administered 2015-12-14: 07:00:00 via INTRAVENOUS

## 2015-12-14 MED ORDER — PHENYLEPHRINE HCL 10 MG/ML IJ SOLN
INTRAMUSCULAR | Status: DC | PRN
  Administered 2015-12-14: 100 ug via INTRAVENOUS

## 2015-12-14 MED ORDER — ONDANSETRON HCL 4 MG/2ML IJ SOLN
4.0000 mg | Freq: Once | INTRAMUSCULAR | Status: AC
  Administered 2015-12-14: 4 mg via INTRAVENOUS

## 2015-12-14 MED ORDER — BUPIVACAINE-EPINEPHRINE (PF) 0.25% -1:200000 IJ SOLN
INTRAMUSCULAR | Status: DC | PRN
  Administered 2015-12-14: 30 mL via PERINEURAL

## 2015-12-14 MED ORDER — HYDROMORPHONE HCL 1 MG/ML IJ SOLN: 0.2500 mg | INTRAMUSCULAR | Status: DC | PRN

## 2015-12-14 MED ORDER — MAGNESIUM SULFATE 4 GM/100ML IV SOLN
4.0000 g | Freq: Once | INTRAVENOUS | Status: AC
  Administered 2015-12-14: 4 g via INTRAVENOUS
  Filled 2015-12-14 (×2): qty 100

## 2015-12-14 MED ORDER — PIPERACILLIN-TAZOBACTAM 3.375 G IVPB 30 MIN
3.3750 g | Freq: Once | INTRAVENOUS | Status: AC
  Administered 2015-12-14: 3.375 g via INTRAVENOUS
  Filled 2015-12-14: qty 50

## 2015-12-14 MED ORDER — MORPHINE SULFATE (PF) 4 MG/ML IV SOLN
4.0000 mg | Freq: Once | INTRAVENOUS | Status: AC
  Administered 2015-12-14: 4 mg via INTRAVENOUS

## 2015-12-14 MED ORDER — PHENYLEPHRINE HCL 10 MG/ML IJ SOLN
10.0000 mg | INTRAVENOUS | Status: DC | PRN
  Administered 2015-12-14: 40 ug/min via INTRAVENOUS

## 2015-12-14 MED ORDER — MORPHINE SULFATE (PF) 4 MG/ML IV SOLN
INTRAVENOUS | Status: AC
  Administered 2015-12-14: 4 mg via INTRAVENOUS
  Filled 2015-12-14: qty 1

## 2015-12-14 MED ORDER — PROPOFOL 1000 MG/100ML IV EMUL
5.0000 ug/kg/min | INTRAVENOUS | Status: DC
Start: 2015-12-14 — End: 2015-12-16
  Administered 2015-12-14: 25 ug/kg/min via INTRAVENOUS
  Administered 2015-12-14: 15 ug/kg/min via INTRAVENOUS
  Administered 2015-12-14: 50 ug/kg/min via INTRAVENOUS
  Administered 2015-12-15: 25 ug/kg/min via INTRAVENOUS
  Filled 2015-12-14 (×5): qty 100

## 2015-12-14 MED ORDER — MORPHINE SULFATE (PF) 2 MG/ML IV SOLN: 2.0000 mg | INTRAVENOUS | Status: DC | PRN

## 2015-12-14 MED ORDER — INSULIN ASPART 100 UNIT/ML ~~LOC~~ SOLN
0.0000 [IU] | SUBCUTANEOUS | Status: DC
  Administered 2015-12-14: 5 [IU] via SUBCUTANEOUS
  Administered 2015-12-14 – 2015-12-15 (×4): 2 [IU] via SUBCUTANEOUS
  Administered 2015-12-15: 3 [IU] via SUBCUTANEOUS
  Administered 2015-12-16 – 2015-12-17 (×7): 2 [IU] via SUBCUTANEOUS
  Filled 2015-12-14 (×4): qty 2
  Filled 2015-12-14: qty 4
  Filled 2015-12-14 (×3): qty 2
  Filled 2015-12-14: qty 3
  Filled 2015-12-14: qty 2
  Filled 2015-12-14: qty 5
  Filled 2015-12-14: qty 1
  Filled 2015-12-14 (×2): qty 2

## 2015-12-14 MED ORDER — SODIUM CHLORIDE 0.9 % IV SOLN
Freq: Once | INTRAVENOUS | Status: AC
  Administered 2015-12-14: 10 mL via INTRAVENOUS

## 2015-12-14 MED ORDER — FENTANYL CITRATE (PF) 100 MCG/2ML IJ SOLN: 25.0000 ug | INTRAMUSCULAR | Status: DC | PRN

## 2015-12-14 MED ORDER — PROTAMINE SULFATE 10 MG/ML IV SOLN
50.0000 mg | Freq: Once | INTRAVENOUS | Status: AC
  Administered 2015-12-14: 50 mg via INTRAVENOUS
  Filled 2015-12-14: qty 5

## 2015-12-14 MED ORDER — SUCCINYLCHOLINE CHLORIDE 20 MG/ML IJ SOLN
INTRAMUSCULAR | Status: DC | PRN
  Administered 2015-12-14: 100 mg via INTRAVENOUS

## 2015-12-14 MED ORDER — IOPAMIDOL (ISOVUE-370) INJECTION 76%
100.0000 mL | Freq: Once | INTRAVENOUS | Status: AC | PRN
  Administered 2015-12-14: 100 mL via INTRAVENOUS

## 2015-12-14 MED ORDER — ONDANSETRON HCL 4 MG/2ML IJ SOLN
INTRAMUSCULAR | Status: AC
  Administered 2015-12-14: 4 mg via INTRAVENOUS
  Filled 2015-12-14: qty 2

## 2015-12-14 MED ORDER — NOREPINEPHRINE BITARTRATE 1 MG/ML IV SOLN
4000.0000 ug | INTRAVENOUS | Status: DC | PRN
  Administered 2015-12-14: 5 ug/min via INTRAVENOUS

## 2015-12-14 MED ORDER — ONDANSETRON HCL 4 MG/2ML IJ SOLN: 4.0000 mg | Freq: Once | INTRAMUSCULAR | Status: DC | PRN

## 2015-12-14 MED ORDER — FAMOTIDINE IN NACL 20-0.9 MG/50ML-% IV SOLN
20.0000 mg | Freq: Two times a day (BID) | INTRAVENOUS | Status: DC
  Administered 2015-12-14 – 2015-12-22 (×17): 20 mg via INTRAVENOUS
  Filled 2015-12-14 (×19): qty 50

## 2015-12-14 MED ORDER — ALBUMIN HUMAN 25 % IV SOLN
25.0000 g | Freq: Once | INTRAVENOUS | Status: AC
  Administered 2015-12-14: 25 g via INTRAVENOUS
  Filled 2015-12-14: qty 100

## 2015-12-14 MED ORDER — PIPERACILLIN-TAZOBACTAM 3.375 G IVPB
3.3750 g | Freq: Three times a day (TID) | INTRAVENOUS | Status: DC
  Administered 2015-12-14 – 2015-12-22 (×25): 3.375 g via INTRAVENOUS
  Filled 2015-12-14 (×28): qty 50

## 2015-12-14 MED ORDER — FENTANYL 2500MCG IN NS 250ML (10MCG/ML) PREMIX INFUSION
10.0000 ug/h | INTRAVENOUS | Status: DC
  Administered 2015-12-14 – 2015-12-15 (×2): 50 ug/h via INTRAVENOUS
  Filled 2015-12-14 (×2): qty 250

## 2015-12-14 MED ORDER — NOREPINEPHRINE BITARTRATE 1 MG/ML IV SOLN: 0.0000 ug/min | INTRAVENOUS | Status: DC

## 2015-12-14 MED ORDER — FENTANYL CITRATE (PF) 100 MCG/2ML IJ SOLN
INTRAMUSCULAR | Status: DC | PRN
  Administered 2015-12-14: 250 ug via INTRAVENOUS

## 2015-12-14 MED ORDER — MORPHINE SULFATE (PF) 4 MG/ML IV SOLN
INTRAVENOUS | Status: AC
  Filled 2015-12-14: qty 1

## 2015-12-14 MED ORDER — NOREPINEPHRINE 4 MG/250ML-% IV SOLN
0.0000 ug/min | INTRAVENOUS | Status: DC
  Administered 2015-12-14: 4 ug/min via INTRAVENOUS
  Administered 2015-12-15: 8 ug/min via INTRAVENOUS
  Administered 2015-12-15: 7 ug/min via INTRAVENOUS
  Administered 2015-12-15: 9 ug/min via INTRAVENOUS
  Administered 2015-12-15: 3 ug/min via INTRAVENOUS
  Administered 2015-12-15: 5 ug/min via INTRAVENOUS
  Administered 2015-12-15: 10 ug/min via INTRAVENOUS
  Administered 2015-12-15: 6 ug/min via INTRAVENOUS
  Filled 2015-12-14 (×3): qty 250

## 2015-12-14 MED ORDER — ONDANSETRON 8 MG PO TBDP
4.0000 mg | ORAL_TABLET | Freq: Four times a day (QID) | ORAL | Status: DC | PRN
  Filled 2015-12-14: qty 1

## 2015-12-14 MED ORDER — DEXTROSE IN LACTATED RINGERS 5 % IV SOLN
INTRAVENOUS | Status: DC
  Administered 2015-12-14 (×2): via INTRAVENOUS
  Administered 2015-12-15: 125 mL via INTRAVENOUS
  Administered 2015-12-16 – 2015-12-19 (×9): via INTRAVENOUS

## 2015-12-14 MED ORDER — SODIUM CHLORIDE 0.9 % IV SOLN
10.0000 mL/h | Freq: Once | INTRAVENOUS | Status: AC
  Administered 2015-12-14 (×2): via INTRAVENOUS

## 2015-12-14 MED ORDER — ROCURONIUM BROMIDE 100 MG/10ML IV SOLN
INTRAVENOUS | Status: DC | PRN
  Administered 2015-12-14 (×2): 50 mg via INTRAVENOUS

## 2015-12-14 MED ORDER — INSULIN ASPART 100 UNIT/ML ~~LOC~~ SOLN
2.0000 [IU] | SUBCUTANEOUS | Status: DC
  Administered 2015-12-14: 6 [IU] via SUBCUTANEOUS
  Filled 2015-12-14: qty 6

## 2015-12-14 MED ORDER — SODIUM CHLORIDE 0.9 % IJ SOLN
INTRAMUSCULAR | Status: DC | PRN
  Administered 2015-12-14: 50 mL via INTRAVENOUS

## 2015-12-14 MED ORDER — MIDAZOLAM HCL 2 MG/2ML IJ SOLN
INTRAMUSCULAR | Status: DC | PRN
  Administered 2015-12-14: 2 mg via INTRAVENOUS

## 2015-12-14 MED ORDER — BUPIVACAINE LIPOSOME 1.3 % IJ SUSP
INTRAMUSCULAR | Status: DC | PRN
  Administered 2015-12-14: 20 mL

## 2015-12-14 MED ORDER — SODIUM CHLORIDE 0.9 % IV BOLUS (SEPSIS): 1000.0000 mL | INTRAVENOUS | Status: DC

## 2015-12-14 MED ORDER — ONDANSETRON HCL 4 MG/2ML IJ SOLN: 4.0000 mg | Freq: Four times a day (QID) | INTRAMUSCULAR | Status: DC | PRN

## 2015-12-14 MED ORDER — SODIUM CHLORIDE 0.9 % IV BOLUS (SEPSIS)
1000.0000 mL | INTRAVENOUS | Status: AC
  Administered 2015-12-14: 1000 mL via INTRAVENOUS

## 2015-12-14 MED ORDER — SODIUM CHLORIDE 0.9 % IV SOLN
80.0000 mg | Freq: Once | INTRAVENOUS | Status: AC
  Administered 2015-12-14: 80 mg via INTRAVENOUS
  Administered 2015-12-14: 10 mg via INTRAVENOUS
  Filled 2015-12-14 (×2): qty 80

## 2015-12-14 SURGICAL SUPPLY — 48 items
APPLIER CLIP 11 MED OPEN (CLIP)
APPLIER CLIP 13 LRG OPEN (CLIP)
APR CLP LRG 13 20 CLIP (CLIP)
APR CLP MED 11 20 MLT OPN (CLIP)
BLADE CLIPPER SURG (BLADE) ×2 IMPLANT
BLADE SURG 15 STRL LF DISP TIS (BLADE) ×1 IMPLANT
BLADE SURG 15 STRL SS (BLADE) ×2
CANISTER SUCT 3000ML (MISCELLANEOUS) ×2 IMPLANT
CHLORAPREP W/TINT 26ML (MISCELLANEOUS) ×2 IMPLANT
CLIP APPLIE 11 MED OPEN (CLIP) ×1 IMPLANT
CLIP APPLIE 13 LRG OPEN (CLIP) ×1 IMPLANT
DRAPE LAPAROTOMY 100X77 ABD (DRAPES) ×2 IMPLANT
DRAPE TABLE BACK 80X90 (DRAPES) ×2 IMPLANT
DRSG TEGADERM 2-3/8X2-3/4 SM (GAUZE/BANDAGES/DRESSINGS) ×2 IMPLANT
DRSG TELFA 3X8 NADH (GAUZE/BANDAGES/DRESSINGS) IMPLANT
ELECT BLADE 6.5 EXT (BLADE) ×1 IMPLANT
ELECT REM PT RETURN 9FT ADLT (ELECTROSURGICAL) ×2
ELECTRODE REM PT RTRN 9FT ADLT (ELECTROSURGICAL) ×1 IMPLANT
GAUZE SPONGE 4X4 12PLY STRL (GAUZE/BANDAGES/DRESSINGS) ×2 IMPLANT
GLOVE BIO SURGEON STRL SZ7 (GLOVE) ×4 IMPLANT
GOWN STRL REUS W/ TWL LRG LVL3 (GOWN DISPOSABLE) ×2 IMPLANT
GOWN STRL REUS W/TWL LRG LVL3 (GOWN DISPOSABLE) ×6
HANDLE SUCTION POOLE (INSTRUMENTS) ×1 IMPLANT
HANDLE YANKAUER SUCT BULB TIP (MISCELLANEOUS) ×1 IMPLANT
JP BULB ×1 IMPLANT
JP DRAIN 15 FR. FULLY FLUTED ×1 IMPLANT
NDL HYPO 25X1 1.5 SAFETY (NEEDLE) ×1 IMPLANT
NEEDLE HYPO 22GX1.5 SAFETY (NEEDLE) ×2 IMPLANT
NEEDLE HYPO 25X1 1.5 SAFETY (NEEDLE) IMPLANT
PACK BASIN MAJOR ARMC (MISCELLANEOUS) ×2 IMPLANT
PAD DRESSING TELFA 3X8 NADH (GAUZE/BANDAGES/DRESSINGS) ×1 IMPLANT
PENCIL ELECTRO HAND CTR (MISCELLANEOUS) ×1 IMPLANT
SPONGE LAP 18X18 5 PK (GAUZE/BANDAGES/DRESSINGS) ×1 IMPLANT
STAPLER SKIN PROX 35W (STAPLE) ×2 IMPLANT
SUCTION POOLE HANDLE (INSTRUMENTS)
SUT PDS AB 0 CT1 27 (SUTURE) ×3 IMPLANT
SUT PDS AB 1 TP1 96 (SUTURE) ×4 IMPLANT
SUT SILK 2 0 (SUTURE) ×4
SUT SILK 2 0 SH CR/8 (SUTURE) ×3 IMPLANT
SUT SILK 2 0SH CR/8 30 (SUTURE) ×1 IMPLANT
SUT SILK 2-0 18XBRD TIE 12 (SUTURE) ×1 IMPLANT
SUT VIC AB 0 CT1 36 (SUTURE) ×2 IMPLANT
SUT VIC AB 2-0 SH 27 (SUTURE)
SUT VIC AB 2-0 SH 27XBRD (SUTURE) ×2 IMPLANT
SYR 20CC LL (SYRINGE) ×3 IMPLANT
SYR 3ML LL SCALE MARK (SYRINGE) ×1 IMPLANT
TAPE MICROFOAM 4IN (TAPE) ×1 IMPLANT
TRAY FOLEY W/METER SILVER 16FR (SET/KITS/TRAYS/PACK) ×2 IMPLANT

## 2015-12-14 NOTE — Consult Note (Addendum)
[facility] Consultation     ASSESSMENT/PLAN     PULMONARY A: Acute vent dependent respiratory failure with hypoxia. -Mild right middle lobe atelectasis. -Review events settings on 6/30: PRVC/15/500/5 -Acute pulmonary emboli and lobar branches, nonobstructive.  P:   -We'll check blood gas -Wean and extubate as tolerated. -We'll check stat lower extremity Dopplers, if lower extremity DVTs are present, the patient will require the placement of an IVC filter. ADDENDUM: PT ALREADY HAS IVC FILTER PLACED IN PAST.   CARDIOVASCULAR A: Hypotension due to hemorrhagic shock. -Status post transfusion of multiple blood products. P:  Continue IV fluid resuscitation, pressors, wean down as tolerated  RENAL A:  Acute kidney injury P:   , Secondary to shock, continue rehydration.  GASTROINTESTINAL A:  Perforated gastric ulcer with blood loss anemia. -Initial hemoglobin 4.7. P:   Continue serial hemoglobin. Transfuse as needed. Continue IV PPI.  HEMATOLOGIC A:  Blood loss anemia, transfuse as needed. -Acute pulmonary emboli, anticoagulation is contraindicated due to GI bleeding and recent surgery.  INFECTIOUS A:  -- Micro/culture results:  BCx2  UC  Sputum  Antibiotics: Zosyn 6/30>>  ENDOCRINE A:  Elevated blood glucose, hyperglycemia. P:   We'll increase sliding scale  NEUROLOGIC A: Currently intubated and sedated, RASS goal -1 --   MAJOR EVENTS/TEST RESULTS:   Best Practices  DVT Prophylaxis: SCD GI Prophylaxis: Propofol   ---------------------------------------  ---------------------------------------   Name: KEN CAMBRAY MRN: [id number] DOB: 1956/05/03    ADMISSION DATE:  12/14/2015 CONSULTATION DATE:  12/14/15  REFERRING MD : Dr. Dahlia Byes.   CHIEF COMPLAINT:  Hypotension.   HISTORY OF PRESENT ILLNESS:    The patient is currently intubated on the ventilator, therefore, all history was obtained from the chart, and from  staff, was discussed with the surgeon, Dr. Dahlia Byes. He was admitted with diffuse abdominal pain and presented with hypotension, as detailed in H&P below. Subsequently, his hemoglobin was noted to be 4.7, with a INR of 1.23, lactic acid of 9.5. CT abdomen revealed free intraperitoneal air, and blood, or neoplasm in the bladder He was given 6 units of red cells, 1 unit of platelets, 4 units of plasma. CT chest images  reviewed: These were significant for bilateral pulmonary emboli. On 6/30. Patient underwent exploratory lap for repair of gastric perforated ulcer. The patient has subsequently come back from the OR, they remain on the ventilator due to large volume of fluids and blood products, he remains hypotensive on Levophed at 4 mics.  Per the H&P ELLIS COWELL is a 60 y.o. male who presents by EMS for evaluation of diffuse lower abdominal pain. He reports that it has been gradually getting worse over the last week but it got severe over the last 1-2 hours. He denies nausea, vomiting, diarrhea, dysuria. Movement makes it worse and nothing makes it better. He has not had a bowel movement for a couple of days but reports he has been passing gas. He has felt lightheaded and dizzy recently. Overall he describes symptoms as severe. He was noted to be hypotensive with a systolic blood pressure in the 90s for EMS.  PAST MEDICAL HISTORY :  Past Medical History  Diagnosis Date  . Diabetes mellitus without complication ([place])     Not on medications  . Hypertension     Not on medications  . Polysubstance abuse     a. ongoing tobacco and alcohol abuse    Past Surgical History  Procedure Laterality Date  . Back surgery    .  Patellar tendon repair    . Testicular torsion repair    . Cardiac catheterization N/A 09/05/2015    Procedure: Left Heart Cath and Coronary Angiography;  Surgeon: Minna Merritts, MD;  Location: [facility];  Service: Cardiovascular;  Laterality: N/A;  . Peripheral  vascular catheterization N/A 09/07/2015    Procedure: IVC Filter Insertion;  Surgeon: Algernon Huxley, MD;  Location: [facility];  Service: Cardiovascular;  Laterality: N/A;   Prior to Admission medications   Medication Sig Start Date End Date Taking? Authorizing Provider  atorvastatin (LIPITOR) 40 MG tablet Take 1 tablet (40 mg total) by mouth daily at 6 PM. 09/08/15   Henreitta Leber, MD  carvedilol (COREG) 3.125 MG tablet Take 1 tablet (3.125 mg total) by mouth 2 (two) times daily with a meal. 09/08/15   Henreitta Leber, MD  enoxaparin (LOVENOX) 150 MG/ML injection Inject 1 mL (150 mg total) into the skin daily. 09/08/15   Henreitta Leber, MD   No Known Allergies  FAMILY HISTORY:  Family History  Problem Relation Age of Onset  . Congestive Heart Failure Mother   . Peripheral vascular disease Father    SOCIAL HISTORY:  reports that he has been smoking Cigarettes.  He has been smoking about 1.00 pack per day. He does not have any smokeless tobacco history on file. He reports that he drinks about 12.6 oz of alcohol per week. He reports that he does not use illicit drugs.  REVIEW OF SYSTEMS:   Could not be obtained as the patient is currently intubated on the ventilator.   VITAL SIGNS: Temp:  [97.7 F (36.5 C)-98.1 F (36.7 C)] 97.7 F (36.5 C) (06/30 0406) Pulse Rate:  [59-118] 111 (06/30 0512) Resp:  [18-32] 24 (06/30 0520) BP: (93-149)/(41-98) 130/76 mmHg (06/30 0520) SpO2:  [80 %-100 %] 100 % (06/30 0512) Weight:  [211 lb 1.6 oz (95.754 kg)] 211 lb 1.6 oz (95.754 kg) (06/30 0219) HEMODYNAMICS:   VENTILATOR SETTINGS:   INTAKE / OUTPUT:  Intake/Output Summary (Last 24 hours) at 12/14/15 0843 Last data filed at 12/14/15 0817  Gross per 24 hour  Intake   3281 ml  Output    350 ml  Net   2931 ml    Physical Examination:   VS: BP 130/76 mmHg  Pulse 111  Temp(Src) 97.7 F (36.5 C) (Oral)  Resp 24  Ht 6\' 1"  (1.854 m)  Wt 211 lb 1.6 oz (95.754 kg)  BMI 27.86 kg/m2   SpO2 100%  General Appearance: No distress  Neuro:without focal findings, mental status, Reduced HEENT: PERRLA, Pulmonary: normal breath sounds., diaphragmatic excursion normal. CardiovascularNormal S1,S2.  No m/r/g.    Abdomen: Benign. Midline incision Renal:  No costovertebral tenderness  GU:  Not performed at this time. Endoc: No evident thyromegaly, no signs of acromegaly. Skin:   warm, no rashes, no ecchymosis  Extremities: normal, no cyanosis, clubbing, no edema, warm with normal capillary refill.    LABS: Reviewed   LABORATORY PANEL:   CBC  Recent Labs Lab 12/14/15 0300  WBC 14.7*  HGB 4.7*  HCT 16.1*  PLT 367    Chemistries   Recent Labs Lab 12/14/15 0300  NA 136  K 3.5  CL 103  CO2 17*  GLUCOSE 213*  BUN 15  CREATININE 1.39*  CALCIUM 8.5*  AST 24  ALT 8*  ALKPHOS 74  BILITOT 0.3    No results for input(s): GLUCAP in the last 168 hours. No results  for input(s): PHART, PCO2ART, PO2ART in the last 168 hours.  Recent Labs Lab 12/14/15 0300  AST 24  ALT 8*  ALKPHOS 74  BILITOT 0.3  ALBUMIN 3.3*    Cardiac Enzymes  Recent Labs Lab 12/14/15 0300  TROPONINI 0.07*    RADIOLOGY:  Dg Chest Port 1 View  12/14/2015  CLINICAL DATA:  Diffuse abdominal pain for over 1 week but worse in the past hour. EXAM: PORTABLE CHEST 1 VIEW COMPARISON:  09/05/2015 FINDINGS: There is free intraperitoneal air. Moderate unchanged cardiomegaly. The lungs are clear. Pulmonary vasculature is normal. IMPRESSION: Free intraperitoneal air. Electronically Signed   By: Andreas Newport M.D.   On: 12/14/2015 04:21   Ct Angio Abd/pel W/ And/or W/o  12/14/2015  CLINICAL DATA:  Diffuse abdominal pain for over week. EXAM: CTA ABDOMEN AND PELVIS wITHOUT AND WITH CONTRAST TECHNIQUE: Multidetector CT imaging of the abdomen and pelvis was performed using the standard protocol during bolus administration of intravenous contrast. Multiplanar reconstructed images and MIPs were  obtained and reviewed to evaluate the vascular anatomy. CONTRAST:  100 mL Isovue 370 intravenous COMPARISON:  09/06/2015 FINDINGS: There is free intraperitoneal air. Source of the free air is not conclusive, but there is mural edema at the gastric antrum, pylorus and duodenal bulb. This may be the site of a hollow viscus perforation. Small to moderate volume ascites. There are unremarkable appearances of the liver, gallbladder, bile ducts, pancreas, spleen, adrenals and kidneys. Ureters are unremarkable. The urinary bladder contains a large volume of irregular soft tissue which may represent blood. Alternatively, a urinary bladder neoplasm may be considered. There is a hiatal hernia. There is uncomplicated colonic diverticulosis. The abdominal aorta is normal in caliber with moderate atherosclerotic calcification. There is an IVC filter. There is no significant abnormality in the lower chest. There is no significant skeletal lesion. CT angiographic images demonstrate normal caliber of the abdominal aorta. The celiac, SMA and IMA are patent. Renal arteries are patent. Common iliac and external iliac arteries are patent. Review of the MIP and multiplanar reconstruction images confirms these findings. IMPRESSION: 1. Free intraperitoneal air. Mural edema at the gastric antrum, pylorus and duodenal bulb suggest this may be the location of the hollow viscus perforation. These results were called by telephone at the time of interpretation on 12/14/2015 at 4:05 am to Dr. Hinda Kehr , who verbally acknowledged these results. 2. Large volume blood or neoplasm within the urinary bladder. 3. Hiatal hernia. 4. Diverticulosis. 5. Small volume ascites. Electronically Signed   By: Andreas Newport M.D.   On: 12/14/2015 04:11       --Deep Ashby Dawes, MD.  Board Certified in Internal Medicine, Pulmonary Medicine, [place], and Sleep Medicine.  ICU Pager [phone] [facility] Office Number: [phone]  Patricia Pesa, M.D.  Vilinda Boehringer, M.D.  Merton Border, M.D   12/14/2015, 8:43 AM   [place].  I have personally obtained a history, examined the patient, evaluated laboratory and imaging results, formulated the assessment and plan and placed orders. The Patient requires high complexity decision making for assessment and support, frequent evaluation and titration of therapies, application of advanced monitoring technologies and extensive interpretation of multiple databases. The patient has critical illness that could lead imminently to failure of 1 or more organ systems and requires the highest level of physician preparedness to intervene.  Critical Care Time devoted to patient care services described in this note is 35 minutes and is exclusive of time spent in  procedures.

## 2015-12-14 NOTE — Op Note (Signed)
Preoperative diagnosis: Septic shock from perforated ulcer Postoperative diagnosis: Same  Procedures: Ultrasound-guided placement of right internal jugular vein central line  Anesthesia: Lidocaine 1% plain  Findings: Widely patent right IJ with the wire within the vessel  Complications: None  After informed consent was obtained the patient's neck was prepped and draped in the usual sterile fashion. Using the ultrasound probe was identified the right internal jugular vein and cannulized with a needle guide on a single stick. Good venous return was obtained and using the modified cells technique a triple-lumen catheter was placed in the standard fashion. The wire was removed and the central line was sutured at 18 cm. Sterile dressing was applied. There were no immediate complications. This was done in an emergent fashion in the emergency room due to hypotension and severe anemia on a patient with perforated gastric ulcer

## 2015-12-14 NOTE — Op Note (Addendum)
PROCEDURES: 1. Exploratory laparotomy 2. Repair of gastric perforated ulcer 3. Omental flap to the repair of the gastric perforated ulcer 4. Incisional Biopsy of stomach from ulcer  Pre-operative Diagnosis: Perforated viscus  Post-operative Diagnosis: Perforated prepyloric ulcer  Surgeon: Marjory Lies Kasra Melvin   Anesthesia: General endotracheal anesthesia  ASA Class: 4   Surgeon: Caroleen Hamman , MD FACS  Anesthesia: Gen. with endotracheal tube  Findings: Perforated prepyloric ulcer with significant contamination to the abdominal cavity  Estimated Blood Loss: 100cc         Drains: 15 fr         Specimens: bx          Complications: none               Procedure Details  The patient was seen again in the Holding Room. The benefits, complications, treatment options, and expected outcomes were discussed with the patient. The risks of bleeding, infection, recurrence of symptoms, failure to resolve symptoms,  bowel injury, any of which could require further surgery were reviewed with the patient.   The patient was taken to Operating Room, identified as Frederick Perry and the procedure verified.  A Time Out was held and the above information confirmed.  Prior to the induction of general anesthesia, antibiotic prophylaxis was administered. VTE prophylaxis was in place. General endotracheal anesthesia was then administered and tolerated well. After the induction, the abdomen was prepped with Chloraprep and draped in the sterile fashion. The patient was positioned in the supine position. Upper midline laparotomy performing the standard fashion and the abdomen was entered under direct visualization using the electrocautery We encountered significant contamination in the abdominal cavity with gastric contents. We aspirated the contents and we also visualized a 1 cm prepyloric ulcer along the lesser curvature. And there was significant premature reaction. To be able to have an adequate mobilization  I perform a kocher maneuver with electrocautery and the standard fashion and also placed labs to be able to have an adequate exposure. Once we have an adequate exposure I repaired the ulcer with interrupted 2-0 silk sutures in a standard fashion. Also omentum was used to mobilized and the greater omentum was used and a tongue was created and and secured with interrupted 2 0 silks to the previous repair. We also performed an incisional biopsy of the edge of the ulcer to r/o CA. There Was a small bleeder that was controlled with a 20 figure-of-eight silk stitch. The thinning blue was injected through the NG tube and there was no evidence of a leak. Abdominal cavity was irrigated in the standard fashion.  A 15 Blake drain was placed on top of the omental patch.  The drain was sutured in place with a 3-0 nylon. We close the abdomen with a 2-0 PDS suture in a running fashion and the skin was closed staples. Liposomal Marcaine was injected on all incision sites under direct visualization. Needle and laparotomy count were correct and there were no immediate occasions  Caroleen Hamman, MD, FACS

## 2015-12-14 NOTE — Progress Notes (Signed)
Inpatient Diabetes Program Recommendations  AACE/ADA: New Consensus Statement on Inpatient Glycemic Control (2015)  Target Ranges:  Prepandial:   less than 140 mg/dL      Peak postprandial:   less than 180 mg/dL (1-2 hours)      Critically ill patients:  140 - 180 mg/dL   Lab Results  Component Value Date   GLUCAP 171* 12/14/2015   HGBA1C 7.4* 09/05/2015    Review of Glycemic Control  Results for Frederick Perry, Frederick Perry (MRN [id number]) as of 12/14/2015 11:23  Ref. Range 12/14/2015 08:21  Glucose-Capillary Latest Ref Range: 65-99 mg/dL 171 (H)    Diabetes history: Type 2 Outpatient Diabetes medications: none Current orders for Inpatient glycemic control: ICU Glycemic Control Phase 1 order set Novolog 2-6 units q4h  Inpatient Diabetes Program Recommendations: Agree with current medication orders for blood sugar management  Gentry Fitz, RN, [place], [place], CDE Diabetes Coordinator Inpatient Diabetes Program  [phone] (Team Pager) [phone] ([place]) 12/14/2015 11:24 AM

## 2015-12-14 NOTE — Transfer of Care (Signed)
Immediate Anesthesia Transfer of Care Note  Patient: Charlotta Newton  Procedure(s) Performed: Procedure(s): EXPLORATORY LAPAROTOMY, prepyloric gastric ulcer biopsy (N/A)  Patient Location: ICU  Anesthesia Type:General  Level of Consciousness: sedated  Airway & Oxygen Therapy: Patient remains intubated per anesthesia plan and Patient placed on Ventilator (see vital sign flow sheet for setting)  Post-op Assessment: Report given to RN and Post -op Vital signs reviewed and stable  Post vital signs: Reviewed and stable  Last Vitals:  Filed Vitals:   12/14/15 0512 12/14/15 0520  BP: 138/98 130/76  Pulse: 111   Temp:    Resp: 27 24    Last Pain:  Filed Vitals:   12/14/15 0523  PainSc: 8          Complications: No apparent anesthesia complications

## 2015-12-14 NOTE — ED Notes (Addendum)
Blood bank notified of MD order for 2 units of type specific blood as requested by Karma Greaser, MD in preparation of patient being taken to the OR by Dr. Dahlia Byes. Per blood bank, they are completing the crossmatch at this time and blood will be ready in the next 20-30 minutes barring any complications. Lab asked to call the ED should any problems arise; this RN's name and number provided.

## 2015-12-14 NOTE — ED Provider Notes (Addendum)
[facility] Emergency Department Provider Note  ____________________________________________  Time seen: Approximately 2:38 AM  I have reviewed the triage vital signs and the nursing notes.   HISTORY  Chief Complaint Abdominal Pain    HPI Frederick Perry is a 60 y.o. male who presents by EMS for evaluation of diffuse lower abdominal pain.  He reports that it has been gradually getting worse over the last week but it got severe over the last 1-2 hours.  He denies nausea, vomiting, diarrhea, dysuria.  Movement makes it worse and nothing makes it better.  He has not had a bowel movement for a couple of days but reports he has been passing gas.  He has felt lightheaded and dizzy recently.  Overall he describes symptoms as severe.  He was noted to be hypotensive with a systolic blood pressure in the 90s for EMS.   Past Medical History  Diagnosis Date  . Diabetes mellitus without complication ([place])     Not on medications  . Hypertension     Not on medications  . Polysubstance abuse     a. ongoing tobacco and alcohol abuse     Patient Active Problem List   Diagnosis Date Noted  . Iron deficiency anemia due to chronic blood loss 09/07/2015  . Weight loss   . NSTEMI (non-ST elevated myocardial infarction) ([place]) 09/05/2015  . Absolute anemia   . Ischemic chest pain ([place])   . Uncontrolled type 2 diabetes mellitus with circulatory disorder ([place])   . Hyperlipidemia     Past Surgical History  Procedure Laterality Date  . Back surgery    . Patellar tendon repair    . Testicular torsion repair    . Cardiac catheterization N/A 09/05/2015    Procedure: Left Heart Cath and Coronary Angiography;  Surgeon: Minna Merritts, MD;  Location: [place] [facility];  Service: Cardiovascular;  Laterality: N/A;  . Peripheral vascular catheterization N/A 09/07/2015    Procedure: IVC Filter Insertion;  Surgeon: Algernon Huxley, MD;  Location: [place] [facility];  Service:  Cardiovascular;  Laterality: N/A;    Current Outpatient Rx  Name  Route  Sig  Dispense  Refill  . atorvastatin (LIPITOR) 40 MG tablet   Oral   Take 1 tablet (40 mg total) by mouth daily at 6 PM.   30 tablet   1   . carvedilol (COREG) 3.125 MG tablet   Oral   Take 1 tablet (3.125 mg total) by mouth 2 (two) times daily with a meal.   60 tablet   1   . enoxaparin (LOVENOX) 150 MG/ML injection   Subcutaneous   Inject 1 mL (150 mg total) into the skin daily.   0 Syringe        Allergies Review of patient's allergies indicates no known allergies.  Family History  Problem Relation Age of Onset  . Congestive Heart Failure Mother   . Peripheral vascular disease Father     Social History Social History  Substance Use Topics  . Smoking status: Current Every Day Smoker -- 1.00 packs/day    Types: Cigarettes  . Smokeless tobacco: None  . Alcohol Use: 12.6 oz/week    21 Cans of beer per week     Comment: daily- beers and occasionally liquor    Review of Systems Constitutional: No fever/chills Eyes: No visual changes. ENT: No sore throat. Cardiovascular: Denies chest pain. Respiratory: Denies shortness of breath. Gastrointestinal: +abdominal pain.  No nausea, no vomiting.  No diarrhea.  Possible constipation. Genitourinary: Negative for dysuria. Musculoskeletal: Negative for back pain. Skin: Negative for rash. Neurological: Negative for headaches, focal weakness or numbness.  +lightheadedness  10-point ROS otherwise negative.  ____________________________________________   PHYSICAL EXAM:  VITAL SIGNS: ED Triage Vitals  Enc Vitals Group     BP 12/14/15 0219 93/41 mmHg     Pulse Rate 12/14/15 0219 100     Resp 12/14/15 0219 20     Temp 12/14/15 0219 97.9 F (36.6 C)     Temp Source 12/14/15 0219 Oral     SpO2 12/14/15 0219 100 %     Weight 12/14/15 0219 211 lb 1.6 oz (95.754 kg)     Height 12/14/15 0219 6\' 1"  (1.854 m)     Head Cir --      Peak Flow --       Pain Score 12/14/15 0219 8     Pain Loc --      Pain Edu? --      Excl. in [place]? --     Constitutional: Alert and oriented. Non-toxic but somewhat ill appearing but in no acute distress Eyes: Conjunctivae are normal. PERRL. EOMI. Head: Atraumatic. Nose: No congestion/rhinnorhea. Mouth/Throat: Mucous membranes are moist.  Oropharynx non-erythematous. Neck: No stridor.  No meningeal signs.   Cardiovascular: Normal rate, regular rhythm. Good peripheral circulation. Grossly normal heart sounds.   Respiratory: Normal respiratory effort.  No retractions. Lungs CTAB. Gastrointestinal: Soft with mild distention and severe tenderness throughout the abdomen. Rectal:  Normal brown stool, heme negative Musculoskeletal: No lower extremity tenderness nor edema. No gross deformities of extremities. Neurologic:  Normal speech and language. No gross focal neurologic deficits are appreciated.  Skin:  Skin is warm, dry and intact. No rash noted. Psychiatric: Mood and affect are normal. Speech and behavior are normal.  ____________________________________________   LABS (all labs ordered are listed, but only abnormal results are displayed)  Labs Reviewed  COMPREHENSIVE METABOLIC PANEL - Abnormal; Notable for the following:    CO2 17 (*)    Glucose, Bld 213 (*)    Creatinine, Ser 1.39 (*)    Calcium 8.5 (*)    Albumin 3.3 (*)    ALT 8 (*)    GFR calc non Af Amer 54 (*)    Anion gap 16 (*)    All other components within normal limits  CBC - Abnormal; Notable for the following:    WBC 14.7 (*)    RBC 1.94 (*)    Hemoglobin 4.7 (*)    HCT 16.1 (*)    MCH 24.5 (*)    MCHC 29.5 (*)    RDW 21.5 (*)    All other components within normal limits  TROPONIN I - Abnormal; Notable for the following:    Troponin I 0.07 (*)    All other components within normal limits  LACTIC ACID, PLASMA - Abnormal; Notable for the following:    Lactic Acid, Venous 9.5 (*)    All other components within normal limits   CULTURE, BLOOD (ROUTINE X 2)  CULTURE, BLOOD (ROUTINE X 2)  URINE CULTURE  LIPASE, BLOOD  URINALYSIS COMPLETEWITH MICROSCOPIC ([facility] ONLY)  LACTIC ACID, PLASMA  APTT  PROTIME-INR  TYPE AND SCREEN  ABO/RH  PREPARE RBC (CROSSMATCH)  PREPARE RBC (CROSSMATCH)   ____________________________________________  EKG  ED ECG REPORT I, Fard Borunda, the attending physician, personally viewed and interpreted this ECG.  Date: 12/14/2015 EKG Time: 03:08 Rate: 113 Rhythm: Sinus tachycardia QRS Axis: normal Intervals:  normal ST/T Wave abnormalities: 1 mm of ST depression in lead V2 and V4 but it is not as obvious in lead V3. No reciprocal changes in inferior leads.   Conduction Disturbances: none Narrative Interpretation:  Non-specific ST segment / T-wave changes, may indicate acute ischemia  ____________________________________________  RADIOLOGY I, Jacquelina Hewins, personally discussed these images and results by phone with the on-call radiologist and used this discussion as part of my medical decision making.    Ct Angio Abd/pel W/ And/or W/o  12/14/2015  CLINICAL DATA:  Diffuse abdominal pain for over week. EXAM: CTA ABDOMEN AND PELVIS wITHOUT AND WITH CONTRAST TECHNIQUE: Multidetector CT imaging of the abdomen and pelvis was performed using the standard protocol during bolus administration of intravenous contrast. Multiplanar reconstructed images and MIPs were obtained and reviewed to evaluate the vascular anatomy. CONTRAST:  100 mL Isovue 370 intravenous COMPARISON:  09/06/2015 FINDINGS: There is free intraperitoneal air. Source of the free air is not conclusive, but there is mural edema at the gastric antrum, pylorus and duodenal bulb. This may be the site of a hollow viscus perforation. Small to moderate volume ascites. There are unremarkable appearances of the liver, gallbladder, bile ducts, pancreas, spleen, adrenals and kidneys. Ureters are unremarkable. The urinary bladder contains a  large volume of irregular soft tissue which may represent blood. Alternatively, a urinary bladder neoplasm may be considered. There is a hiatal hernia. There is uncomplicated colonic diverticulosis. The abdominal aorta is normal in caliber with moderate atherosclerotic calcification. There is an IVC filter. There is no significant abnormality in the lower chest. There is no significant skeletal lesion. CT angiographic images demonstrate normal caliber of the abdominal aorta. The celiac, SMA and IMA are patent. Renal arteries are patent. Common iliac and external iliac arteries are patent. Review of the MIP and multiplanar reconstruction images confirms these findings. IMPRESSION: 1. Free intraperitoneal air. Mural edema at the gastric antrum, pylorus and duodenal bulb suggest this may be the location of the hollow viscus perforation. These results were called by telephone at the time of interpretation on 12/14/2015 at 4:05 am to Dr. Hinda Kehr , who verbally acknowledged these results. 2. Large volume blood or neoplasm within the urinary bladder. 3. Hiatal hernia. 4. Diverticulosis. 5. Small volume ascites. Electronically Signed   By: Andreas Newport M.D.   On: 12/14/2015 04:11    ____________________________________________   PROCEDURES  Procedure(s) performed:   .Critical Care Performed by: Hinda Kehr Authorized by: Hinda Kehr Total critical care time: 60 minutes Critical care time was exclusive of separately billable procedures and treating other patients. Critical care was necessary to treat or prevent imminent or life-threatening deterioration of the following conditions: circulatory failure and sepsis. Critical care was time spent personally by me on the following activities: development of treatment plan with patient or surrogate, discussions with consultants, evaluation of patient's response to treatment, examination of patient, obtaining history from patient or surrogate, ordering  and performing treatments and interventions, ordering and review of laboratory studies, ordering and review of radiographic studies, pulse oximetry, re-evaluation of patient's condition and review of old charts.     ____________________________________________   INITIAL IMPRESSION / [place] / ED COURSE  Pertinent labs & imaging results that were available during my care of the patient were reviewed by me and considered in my medical decision making (see chart for details).  The patient is not reporting any chest pain but he has had some dyspnea on exertion.  I am concerned about the  initially gradual but now rapid worsening of his abdominal pain as well as his hypotension.  He does not qualify for sepsis protocol at this point given that he is afebrile with a normal respiratory rate and normal SPO2.  He has a mild tachycardia but his hypotension does not qualify as sepsis criteria.  We are obtaining IV access.  I performed a bedside FAST scan and there is no evidence of fluid around either kidney nor free in the abdomen.  Additionally the portion of his aorta I was able to visualize appeared normal but the pressure of the ultrasound probe was very uncomfortable for him and I could not adequately visualize the entire aorta.  However I do not appreciate any abdominal bruits, pulsatile masses, and as I said there is no free fluid in the abdomen.  My differential is broad and includes appendicitis, bowel ischemia, aortic pathology, and more.  Proceed with CT angiography of the abdomen and pelvis as this is most likely to yield the most results.  I am awaiting labs and giving 1 L normal saline.  ----------------------------------------- 3:34 AM on 12/14/2015 -----------------------------------------  The patient's blood pressure is up to 128/76, but his abdominal pain is getting acutely worse.  He is now moaning and holding his abdomen and trying to find a position of comfort.  The lab  just called and informed us that his hemoglobin is 4.7.  Given my concern that he may be bleeding in his abdomen and I have ordered 2 units of emergency release O+ blood and we are getting it from the blood bank now.  The patient is being rushed to the CT scanner for a CT angiogram abdomen/pelvis.  I am giving him morphine 4 mg IV and Zofran 4 mg IV.  I will monitor closely once again it is back from the scan  ----------------------------------------- 4:01 AM on 12/14/2015 -----------------------------------------  Lab just called and informed us that the patient's lactate is greater than 9.  He is already getting his first unit of packed red blood cells and I am ordering another liter of IV fluids.  I have ordered the rest of the sepsis protocol including Zosyn for probable intra-abdominal infection.  I am going to combine packed red cells and IV fluids to try to reach our goal of 30 mL/kg but my CT scan results are not back yet and this may be more of an issue of mesenteric ischemia or an intra-abdominal hemorrhage.  Patient is still in severe pain and has gotten a second dose of morphine.  His blood pressure is now about 123456 systolic and his tachycardia at about 115.  He is still alert and oriented and mentating well.  ----------------------------------------- 4:18 AM on 12/14/2015 -----------------------------------------  Radiology called to inform me of the patient's free air due to a perforation along the GI tract, most likely from the pylorus.  He also has a large volume of blood in the bladder, possibly tumor, but it is poorly visualized.  The patient is hemodynamically stable and receiving one unit of blood.  I called and spoke by phone with Dr. Dahlia Byes who will call the operating room and then come see the patient in person to take him to the operating room.  He asked that I go ahead and give both units of emergency release blood and we have ordered 2 units for crossmatched to be taken to the  operating room.  The patient is receiving his empiric antibiotics and an additional liter of fluids.  He  is still mentating well.  He likely will not receive a full 3 L of fluid before going to the operating room.  ----------------------------------------- 4:42 AM on 12/14/2015 -----------------------------------------  Spoke in person with Dr. Dahlia Byes who has evaluated the patient in person.  He has asked me to order 1 unit of platelets from [facility] and 4 units of FFP (to transfuse 2 units now).  Our charge nurses coordinating with the blood bank to have the platelets sent over from [place].  Dr. Dahlia Byes is proceeding with placing a central line.   ____________________________________________  FINAL CLINICAL IMPRESSION(S) / ED DIAGNOSES  Final diagnoses:  Abdominal pain  Perforation bowel ([place])  Sepsis, due to unspecified organism ([place])  Elevated troponin I level  Elevated lactic acid level     MEDICATIONS GIVEN DURING THIS VISIT:  Medications  0.9 %  sodium chloride infusion (not administered)  piperacillin-tazobactam (ZOSYN) IVPB 3.375 g (not administered)  sodium chloride 0.9 % bolus 1,000 mL (not administered)  albumin human 25 % solution 25 g (not administered)  sodium chloride 0.9 % bolus 1,000 mL (1,000 mLs Intravenous New Bag/Given 12/14/15 0309)  morphine 4 MG/ML injection 4 mg (4 mg Intravenous Given 12/14/15 0335)  ondansetron (ZOFRAN) injection 4 mg (4 mg Intravenous Given 12/14/15 0335)  iopamidol (ISOVUE-370) 76 % injection 100 mL (100 mLs Intravenous Contrast Given 12/14/15 0339)  morphine 4 MG/ML injection 4 mg (4 mg Intravenous Given 12/14/15 0357)     NEW OUTPATIENT MEDICATIONS STARTED DURING THIS VISIT:  New Prescriptions   No medications on file      Note:  This document was prepared using Dragon voice recognition software and may include unintentional dictation errors.   Hinda Kehr, MD 12/14/15 [id number]  Hinda Kehr, MD 12/14/15 Nettie, MD 12/14/15 [phone]

## 2015-12-14 NOTE — Progress Notes (Signed)
Pharmacy Antibiotic Note  Frederick Perry is a 60 y.o. male admitted on 12/14/2015 with perforated bowel s/p laparotomy.  Pharmacy has been consulted for Zosyn dosing.  Plan: Patient received Zosyn 3.375 g iv once at 0600. Will begin Zosyn 3.375g IV q8h (4 hour infusion) 6 hours after initial dose.   Height: 6\' 1"  (185.4 cm) Weight: 211 lb 1.6 oz (95.754 kg) IBW/kg (Calculated) : 79.9  Temp (24hrs), Avg:97.9 F (36.6 C), Min:97.7 F (36.5 C), Max:98.1 F (36.7 C)   Recent Labs Lab 12/14/15 0300 12/14/15 0312  WBC 14.7*  --   CREATININE 1.39*  --   LATICACIDVEN  --  9.5*    Estimated Creatinine Clearance: 65.5 mL/min (by C-G formula based on Cr of 1.39).    No Known Allergies  Antimicrobials this admission: Zosyn 6/30 >>   Dose adjustments this admission:  Microbiology results: 6/30 BCx: pending 6/30 UCx: pending   Thank you for allowing pharmacy to be a part of this patient's care.  Ulice Dash D 12/14/2015 8:49 AM

## 2015-12-14 NOTE — ED Notes (Signed)
Assisted Dr. Dahlia Byes with placement of triple lumen cental line.

## 2015-12-14 NOTE — Anesthesia Preprocedure Evaluation (Addendum)
Anesthesia Evaluation  Patient identified by MRN, date of birth, ID band Patient awake    Reviewed: Allergy & Precautions, H&P , NPO status , Patient's Chart, lab work & pertinent test results, reviewed documented beta blocker date and time   Airway Mallampati: II   Neck ROM: full    Dental  (+) Poor Dentition, Teeth Intact   Pulmonary neg pulmonary ROS, Current Smoker,    Pulmonary exam normal        Cardiovascular hypertension, On Medications (-) angina+ Past MI  negative cardio ROS Normal cardiovascular exam Rate:Tachycardia     Neuro/Psych negative neurological ROS  negative psych ROS   GI/Hepatic negative GI ROS, Neg liver ROS,   Endo/Other  negative endocrine ROSdiabetes  Renal/GU negative Renal ROS  negative genitourinary   Musculoskeletal   Abdominal   Peds  Hematology negative hematology ROS (+) anemia ,   Anesthesia Other Findings Past Medical History:   Diabetes mellitus without complication ([place])                   Comment:Not on medications   Hypertension                                                   Comment:Not on medications   Polysubstance abuse                                            Comment:a. ongoing tobacco and alcohol abuse  Past Surgical History:   BACK SURGERY                                                  Patellar tendon repair                                        Testicular torsion repair                                     CARDIAC CATHETERIZATION                         N/A 09/05/2015      Comment:Procedure: Left Heart Cath and Coronary               Angiography;  Surgeon: Minna Merritts, MD;                Location: [place] [facility];  Service:               Cardiovascular;  Laterality: N/A;   PERIPHERAL VASCULAR CATHETERIZATION             N/A 09/07/2015      Comment:Procedure: IVC Filter Insertion;  Surgeon:               Algernon Huxley, MD;  Location: [place] INVASIVE [facility]  LAB;  Service: Cardiovascular;  Laterality:               N/A; BMI    Body Mass Index   27.85 kg/m 2     Reproductive/Obstetrics                          Anesthesia Physical Anesthesia Plan  ASA: IV and emergent  Anesthesia Plan: General   Post-op Pain Management:    Induction:   Airway Management Planned:   Additional Equipment:   Intra-op Plan:   Post-operative Plan:   Informed Consent: I have reviewed the patients History and Physical, chart, labs and discussed the procedure including the risks, benefits and alternatives for the proposed anesthesia with the patient or authorized representative who has indicated his/her understanding and acceptance.   Dental Advisory Given  Plan Discussed with: CRNA  Anesthesia Plan Comments:       Anesthesia Quick Evaluation

## 2015-12-14 NOTE — ED Notes (Addendum)
Patient presents to ED from home via EMS. Patient with c/o diffuse abdominal pain for over a week, but worse over the last 1-2 hours. Denies N/V/D and urinary symptoms.  LNBM was 2-3 days ago. (+) vertiginous symptoms reported. Patient HYPOtensive with EMS; SBP 90.

## 2015-12-14 NOTE — Anesthesia Procedure Notes (Signed)
Procedure Name: Intubation Date/Time: 12/14/2015 5:59 AM Performed by: Rosaria Ferries, Alexsus Papadopoulos Pre-anesthesia Checklist: Patient identified, Emergency Drugs available, Suction available and Patient being monitored Patient Re-evaluated:Patient Re-evaluated prior to inductionOxygen Delivery Method: Circle system utilized Preoxygenation: Pre-oxygenation with 100% oxygen Intubation Type: IV induction Laryngoscope Size: Mac and 3 Grade View: Grade I Tube size: 7.0 mm Number of attempts: 1 Secured at: 23 cm Tube secured with: Tape Dental Injury: Teeth and Oropharynx as per pre-operative assessment

## 2015-12-14 NOTE — Progress Notes (Signed)
Initial Nutrition Assessment  DOCUMENTATION CODES:   Not applicable  INTERVENTION:  -Await poc, currently NPO day 1   NUTRITION DIAGNOSIS:   Inadequate oral intake related to acute illness as evidenced by NPO status.  GOAL:   Provide needs based on ASPEN/SCCM guidelines  MONITOR:   Vent status, Labs, Weight trends, I & O's  REASON FOR ASSESSMENT:   Malnutrition Screening Tool    ASSESSMENT:   60 yo male admitted with severe anemia, Hgb 4.0, found to have perforated prepyloric ulcer, s/p ex lap with repair of perforation on 6/30. Pt with hx of bladder mass as well  Patient is currently intubated on ventilator support, unable to extubate post-op MV: 7.3 L/min Temp (24hrs), Avg:97.9 F (36.6 C), Min:97.7 F (36.5 C), Max:98.1 F (36.7 C)  Propofol: started this AM    Past Medical History  Diagnosis Date  . Diabetes mellitus without complication ([place])     Not on medications  . Hypertension     Not on medications  . Polysubstance abuse     a. ongoing tobacco and alcohol abuse     Diet Order:  Diet NPO time specified  Skin:  Reviewed, no issues  Last BM:  no documented BM   Labs: magnesium 1.6  Meds: D5-LR at 125 ml/hr, diprivan, fentanyl  Height:   Ht Readings from Last 1 Encounters:  12/14/15 6\' 1"  (1.854 m)    Weight: 4% wt loss in 3 months per weight encounters  Wt Readings from Last 1 Encounters:  12/14/15 211 lb 1.6 oz (95.754 kg)    Wt Readings from Last 10 Encounters:  12/14/15 211 lb 1.6 oz (95.754 kg)  09/26/15 213 lb 3 oz (96.7 kg)  09/05/15 220 lb 10.9 oz (100.1 kg)    BMI:  Body mass index is 27.86 kg/(m^2).  Estimated Nutritional Needs:   Kcal:  1897 kcals   Protein:  >/= 146 g  Fluid:  >/= 2 L  EDUCATION NEEDS:   No education needs identified at this time  [place], [place], [place] [phone] Pager  [phone] Weekend/On-Call Pager

## 2015-12-14 NOTE — Care Management (Signed)
Admitted with a perforated gastric ulcer and exploratory lap repair. Uninsured patient. At 08/2015 admission, patient was given application to [facility]. He also utilized the [facility] program.  He is now in the ICU, vented and sedated. Will follow and assist as needed.

## 2015-12-14 NOTE — H&P (Signed)
Patient ID: Frederick Perry, male   DOB: 1956-01-13, 60 y.o.   MRN: [id number]  History of Present Illness Frederick Perry is a 60 y.o. male in the emergency room after developing severe abdominal pain today. Patient reports that over the last week he has been having some abdominal aches and he has been taking Aleve for this. He reports that today he had severe pain , sharp, nonirradiated worsening with movements. He also had nausea and vomiting. No evidence of an active bleed. Patient past medical history is complex significant for a coronary artery disease with an MI non-STEMI about 3 months ago managed with medical therapy. At that time cardiac catheter was performed and no stents were placed. He also has a history of a bladder mass that has been followed. He has a history of chronic anemia and a history of DVT with PE. He had a IVC filter placed recently and was on anticoagulation. He has not taken the Lovenox for several weeks now because there has been a lot of social issues going on his family. He denies any recent chest pain or shortness of breath. Apparently he is functional and he is able to drive and walks without any shortness of breath or chest pain. As for the diagnostic workup in the ER a CT scan was performed and a half personally reviewed there is evidence of free air there is evidence of inflammatory response at the antrum and pyloric region. At this also some free fluid and free air. And there is evidence of a bladder mass as well. no evidence of  ischemia of the small bowel. His hemoglobin on arrival was 4 and he was also hypotensive and diaphoretic.  Past Medical History Past Medical History  Diagnosis Date  . Diabetes mellitus without complication ([place])     Not on medications  . Hypertension     Not on medications  . Polysubstance abuse     a. ongoing tobacco and alcohol abuse      Past Surgical History  Procedure Laterality Date  . Back surgery    . Patellar tendon  repair    . Testicular torsion repair    . Cardiac catheterization N/A 09/05/2015    Procedure: Left Heart Cath and Coronary Angiography;  Surgeon: Minna Merritts, MD;  Location: [facility];  Service: Cardiovascular;  Laterality: N/A;  . Peripheral vascular catheterization N/A 09/07/2015    Procedure: IVC Filter Insertion;  Surgeon: Algernon Huxley, MD;  Location: [facility];  Service: Cardiovascular;  Laterality: N/A;    No Known Allergies  Current Facility-Administered Medications  Medication Dose Route Frequency Provider Last Rate Last Dose  . 0.9 %  sodium chloride infusion  10 mL/hr Intravenous Once Hinda Kehr, MD      . piperacillin-tazobactam (ZOSYN) IVPB 3.375 g  3.375 g Intravenous Once Hinda Kehr, MD 100 mL/hr at 12/14/15 0521 3.375 g at 12/14/15 0521  . sodium chloride 0.9 % bolus 1,000 mL  1,000 mL Intravenous STAT Hinda Kehr, MD       Current Outpatient Prescriptions  Medication Sig Dispense Refill  . atorvastatin (LIPITOR) 40 MG tablet Take 1 tablet (40 mg total) by mouth daily at 6 PM. 30 tablet 1  . carvedilol (COREG) 3.125 MG tablet Take 1 tablet (3.125 mg total) by mouth 2 (two) times daily with a meal. 60 tablet 1  . enoxaparin (LOVENOX) 150 MG/ML injection Inject 1 mL (150 mg total) into the skin daily. 0 Syringe  Family History Family History  Problem Relation Age of Onset  . Congestive Heart Failure Mother   . Peripheral vascular disease Father      Social History Social History  Substance Use Topics  . Smoking status: Current Every Day Smoker -- 1.00 packs/day    Types: Cigarettes  . Smokeless tobacco: None  . Alcohol Use: 12.6 oz/week    21 Cans of beer per week     Comment: daily- beers and occasionally liquor    ROS 10 pt ROS performed and is otherwise negative  Physical Exam Blood pressure 130/76, pulse 111, temperature 97.7 F (36.5 C), temperature source Oral, resp. rate 24, height 6\' 1"  (1.854 m), weight 95.754 kg  (211 lb 1.6 oz), SpO2 100 %.  CONSTITUTIONAL: HE is in obvious distress, tachycardic and diaphoretic, he answerers questions appropriately and is competent EYES: Pupils equal, round, and reactive to light, Sclera non-icteric. EARS, NOSE, MOUTH AND THROAT: The oropharynx is clear. Oral mucosa is pink and moist. Hearing is intact to voice.  NECK: Trachea is midline, and there is no jugular venous distension. Thyroid is without palpable abnormalities. LYMPH NODES:  Lymph nodes in the neck are not enlarged. RESPIRATORY:  Lungs are clear, and breath sounds are equal bilaterally. Normal respiratory effort without pathologic use of accessory muscles. CARDIOVASCULAR: Heart is regular without murmurs, gallops, or rubs. Sinus tachy GI: The abdomen is rigid with rebound tenderness and obvious peritoneal signs. There are no masses or hernias  MUSCULOSKELETAL:  Normal muscle strength and tone in all four extremities.    SKIN: Skin turgor is normal. There are no pathologic skin lesions.  NEUROLOGIC:  Motor and sensation is grossly normal.  Cranial nerves are grossly intact. PSYCH:  Alert and oriented to person, place and time. Affect is normal.  Data Reviewed  I have personally reviewed the patient's imaging and medical records.    Assessment/Plan 60 year old with multiple comorbidities including coronary artery disease status post a recent MI less than 3 months ago, history of DVT and PE, diabetes hypertension and tobacco abuse now with free air consistent with a perforated gastric/duodenal ulcer related to NSAIDs. Patient is profoundly anemic and is in acute distress.. His only chance of survival will be from exploratory laparotomy and aggressive fluid resuscitation with a combination of colloids, crystalloids, red blood cells, FFP and platelets. Discussed with the patient in detail about the gravity and the severity of his condition, he does wishes to be aggressive and have surgicalintervention regardless  of the significant potential risks including but not limited to death, bleeding, infection, prolonged ICU course, re-interventions. I have provided extensive counseling to the patient and he will need for emergent an aggressive surgical attention. I have also notified the ICU, blood bank and the surgical team. He is definitely a high surgical risk but at this time is his only chance of [place], MD [place] 12/14/2015, 5:27 AM

## 2015-12-15 ENCOUNTER — Inpatient Hospital Stay: Payer: Medicaid Other

## 2015-12-15 DIAGNOSIS — J9601 Acute respiratory failure with hypoxia: Secondary | ICD-10-CM

## 2015-12-15 DIAGNOSIS — I959 Hypotension, unspecified: Secondary | ICD-10-CM

## 2015-12-15 LAB — BLOOD GAS, ARTERIAL
ACID-BASE DEFICIT: 0.2 mmol/L (ref 0.0–2.0)
ALLENS TEST (PASS/FAIL): POSITIVE — AB
BICARBONATE: 26.3 meq/L (ref 21.0–28.0)
FIO2: 0.45
MECHVT: 500 mL
Mechanical Rate: 15
O2 Saturation: 98.7 %
PEEP: 5 cmH2O
Patient temperature: 37
pCO2 arterial: 51 mmHg — ABNORMAL HIGH (ref 32.0–48.0)
pH, Arterial: 7.32 — ABNORMAL LOW (ref 7.350–7.450)
pO2, Arterial: 130 mmHg — ABNORMAL HIGH (ref 83.0–108.0)

## 2015-12-15 LAB — COMPREHENSIVE METABOLIC PANEL
ALT: 10 U/L — AB (ref 17–63)
AST: 24 U/L (ref 15–41)
Albumin: 2.8 g/dL — ABNORMAL LOW (ref 3.5–5.0)
Alkaline Phosphatase: 45 U/L (ref 38–126)
Anion gap: 3 — ABNORMAL LOW (ref 5–15)
BUN: 16 mg/dL (ref 6–20)
CALCIUM: 7.5 mg/dL — AB (ref 8.9–10.3)
CO2: 26 mmol/L (ref 22–32)
Chloride: 109 mmol/L (ref 101–111)
Creatinine, Ser: 1.19 mg/dL (ref 0.61–1.24)
GFR calc non Af Amer: >60 mL/min (ref >60)
Glucose, Bld: 129 mg/dL — ABNORMAL HIGH (ref 65–99)
Potassium: 4.6 mmol/L (ref 3.5–5.1)
SODIUM: 138 mmol/L (ref 135–145)
Total Bilirubin: 4 mg/dL — ABNORMAL HIGH (ref 0.3–1.2)
Total Protein: 5.6 g/dL — ABNORMAL LOW (ref 6.5–8.1)

## 2015-12-15 LAB — GLUCOSE, CAPILLARY
GLUCOSE-CAPILLARY: 110 mg/dL — AB (ref 65–99)
GLUCOSE-CAPILLARY: 121 mg/dL — AB (ref 65–99)
GLUCOSE-CAPILLARY: 121 mg/dL — AB (ref 65–99)
GLUCOSE-CAPILLARY: 123 mg/dL — AB (ref 65–99)
Glucose-Capillary: 146 mg/dL — ABNORMAL HIGH (ref 65–99)
Glucose-Capillary: 118 mg/dL — ABNORMAL HIGH (ref 65–99)
Glucose-Capillary: 112 mg/dL — ABNORMAL HIGH (ref 65–99)

## 2015-12-15 LAB — MAGNESIUM: Magnesium: 2.5 mg/dL — ABNORMAL HIGH (ref 1.7–2.4)

## 2015-12-15 LAB — PREPARE PLATELET PHERESIS: UNIT DIVISION: 0

## 2015-12-15 LAB — CBC
HEMATOCRIT: 24.3 % — AB (ref 40.0–52.0)
Hemoglobin: 8.3 g/dL — ABNORMAL LOW (ref 13.0–18.0)
MCH: 28.8 pg (ref 26.0–34.0)
MCHC: 34 g/dL (ref 32.0–36.0)
MCV: 84.7 fL (ref 80.0–100.0)
Platelets: 202 10*3/uL (ref 150–440)
RBC: 2.87 MIL/uL — ABNORMAL LOW (ref 4.40–5.90)
RDW: 16.4 % — AB (ref 11.5–14.5)
WBC: 19.8 10*3/uL — ABNORMAL HIGH (ref 3.8–10.6)

## 2015-12-15 LAB — MRSA PCR SCREENING: MRSA BY PCR: NEGATIVE

## 2015-12-15 LAB — PHOSPHORUS: PHOSPHORUS: 4 mg/dL (ref 2.5–4.6)

## 2015-12-15 LAB — EXPECTORATED SPUTUM ASSESSMENT W GRAM STAIN, RFLX TO RESP C

## 2015-12-15 LAB — EXPECTORATED SPUTUM ASSESSMENT W REFEX TO RESP CULTURE

## 2015-12-15 MED ORDER — ANTISEPTIC ORAL RINSE SOLUTION (CORINZ)
7.0000 mL | OROMUCOSAL | Status: DC
  Administered 2015-12-15 – 2015-12-16 (×13): 7 mL via OROMUCOSAL
  Filled 2015-12-15 (×18): qty 7

## 2015-12-15 MED ORDER — CHLORHEXIDINE GLUCONATE 0.12% ORAL RINSE (MEDLINE KIT)
15.0000 mL | Freq: Two times a day (BID) | OROMUCOSAL | Status: DC
  Administered 2015-12-15 – 2015-12-16 (×2): 15 mL via OROMUCOSAL
  Filled 2015-12-15 (×4): qty 15

## 2015-12-15 NOTE — Progress Notes (Signed)
Placed on high fowlers position, cuff deflated, suctioned orally and endotracheally and then extubated to 4 lpm O2 [place]. Tolerating well

## 2015-12-15 NOTE — Progress Notes (Signed)
PHARMACY ELECTROLYTE CONSULT NOTE - INITIAL  Pharmacy Consult for Electrolyte replacement Indication: hypomagnesia  No Known Allergies  Patient Measurements: Height: '6\' 1"'  (185.4 cm) Weight: 211 lb 1.6 oz (95.754 kg) IBW/kg (Calculated) : 79.9 Adjusted Body Weight:    Vital Signs: Temp: 98.7 F (37.1 C) (07/01 1200) Temp Source: Axillary (07/01 1200) BP: 112/67 mmHg (07/01 1300) Pulse Rate: 83 (07/01 1300) Intake/Output from previous day: 06/30 0701 - 07/01 0700 In: 4110 [I.V.:2589; Blood:1041; NG/GT:30; IV Piggyback:450] Out: 2660 [Urine:2230; Drains:380; Blood:50] Intake/Output from this shift: Total I/O In: 2906.1 [I.V.:2906.1] Out: 762 [Urine:700; Drains:62]  Labs:  Recent Labs  12/14/15 0300 12/14/15 0312 12/14/15 0915 12/14/15 1816 12/15/15 0438  WBC 14.7*  --  8.4  --  19.8*  HGB 4.7*  --  8.6* 7.6* 8.3*  HCT 16.1*  --  25.6* 23.4* 24.3*  PLT 367  --  244  --  202  APTT  --  28  --   --   --   INR  --  1.23  --   --   --      Recent Labs  12/14/15 0300 12/14/15 0915 12/15/15 0438  NA 136 137 138  K 3.5 4.2 4.6  CL 103 106 109  CO2 17* 23 26  GLUCOSE 213* 179* 129*  BUN '15 16 16  ' CREATININE 1.39* 1.51* 1.19  CALCIUM 8.5* 7.7* 7.5*  MG  --  1.6* 2.5*  PHOS  --  3.8 4.0  PROT 6.9  --  5.6*  ALBUMIN 3.3*  --  2.8*  AST 24  --  24  ALT 8*  --  10*  ALKPHOS 74  --  45  BILITOT 0.3  --  4.0*  TRIG  --  146  --    Estimated Creatinine Clearance: 76.5 mL/min (by C-G formula based on Cr of 1.19).    Recent Labs  12/15/15 0703 12/15/15 0919 12/15/15 1149  GLUCAP 123* 121* 121*    Medical History: Past Medical History  Diagnosis Date  . Diabetes mellitus without complication ([place])     Not on medications  . Hypertension     Not on medications  . Polysubstance abuse     a. ongoing tobacco and alcohol abuse     Medications:  Scheduled:  . antiseptic oral rinse  7 mL Mouth Rinse 10 times per day  . chlorhexidine gluconate (SAGE  KIT)  15 mL Mouth Rinse BID  . famotidine (PEPCID) IV  20 mg Intravenous Q12H  . insulin aspart  0-15 Units Subcutaneous Q4H  . piperacillin-tazobactam (ZOSYN)  IV  3.375 g Intravenous Q8H      Assessment: 60 yo male status post exploratory laparoscopy due to gastric ulcer complicated by hemorrhagic shock, currently intubated and ventilated. Acute PE  6/30:  Mag= 1.6 K=4.2 Phos 3.8  (received Mag 4 gram IV x 1)   Plan:  7/1- K=4.6, Mag=2.5, Phos= 4.0 No supplementtion required. F/u electrolytes in am.  Tannisha Kennington A 12/15/2015,2:43 PM

## 2015-12-15 NOTE — Progress Notes (Signed)
Pharmacy Antibiotic Note  Frederick Perry is a 60 y.o. male admitted on 12/14/2015 with perforated bowel s/p laparotomy.  Pharmacy has been consulted for Zosyn dosing.  Plan: Patient received Zosyn 3.375 g iv once at 0600. Will begin Zosyn 3.375g IV q8h (4 hour infusion) 6 hours after initial dose.   Height: 6\' 1"  (185.4 cm) Weight: 211 lb 1.6 oz (95.754 kg) IBW/kg (Calculated) : 79.9  Temp (24hrs), Avg:98.5 F (36.9 C), Min:97.9 F (36.6 C), Max:98.8 F (37.1 C)   Recent Labs Lab 12/14/15 0300 12/14/15 0312 12/14/15 0915 12/15/15 0438  WBC 14.7*  --  8.4 19.8*  CREATININE 1.39*  --  1.51* 1.19  LATICACIDVEN  --  9.5*  --   --     Estimated Creatinine Clearance: 76.5 mL/min (by C-G formula based on Cr of 1.19).    No Known Allergies  Antimicrobials this admission: Zosyn 6/30 >>   Dose adjustments this admission:  Microbiology results: 6/30 BCx: pending 6/30 UCx: pending  7/1- trach aspirate cx:   Thank you for allowing pharmacy to be a part of this patient's care.  Dorian Duval A 12/15/2015 2:50 PM

## 2015-12-15 NOTE — Progress Notes (Signed)
Attempted to adavance ET tube 2 cm to 26 cm at lip mark per order, however patient started coughing continously and is very uncomfortable. Therefore ET tube pulled back and placed back to the 24 cm mark. Dr.Ramachandran is notified

## 2015-12-15 NOTE — Progress Notes (Signed)
[facility] [place] Progress Note Patient Name: Frederick Perry DOB: May 09, 1956 MRN: [id number]   Date of Service  12/15/2015  HPI/Events of Note  RN calls re starting diet after extubation  eICU Interventions  Pt looks comfortable extubated.  Pt intubated for surgery, gastric ulcer, on 6/30.  Told RN to ask surgery if we can start feeding pt.      Intervention Category Minor Interventions: Other:  [place] 12/15/2015, 6:06 PM

## 2015-12-15 NOTE — Progress Notes (Addendum)
[facility] Progess Note    ASSESSMENT/PLAN    60 year old male, status post exploratory laparoscopy due to her gastric ulcer complicated by hemorrhagic shock, currently intubated and ventilated.  PULMONARY A: Acute vent dependent respiratory failure with hypoxia. -Mild right middle lobe atelectasis. -Review events settings on 6/30: PRVC/15/500/5 -Acute pulmonary emboli and lobar branches, nonobstructive. -Blood gas reviewed 7.32/51/130/26.3, consistent with mild hypercapnic respiratory failure. -Dopplers from 6/30. Report reviewed Partially occlusive thrombus in the right lower extremity involving the femoral, popliteal, and posterior tibial veins. Chest x-ray from 7/1 reviewed, I support devices in place, unremarkable. Lungs, ET tube appears somewhat high.  P:  -We'll check blood gas -Wean and extubate as tolerated. -Patient RE has an IVC filter in place. -We'll wean down ventilator further. Once the patient is more stable.  -We'll advanced ET tube by 2 cm.  CARDIOVASCULAR A: Hypotension due to hemorrhagic shock. -Status post transfusion of multiple blood products. P:  Continue IV fluid resuscitation, pressors, wean down as tolerated  RENAL A: Acute kidney injury, improved creatinine down to 1.19 today. P:  , Secondary to shock, continue rehydration.  GASTROINTESTINAL A: Perforated gastric ulcer with blood loss anemia. -Initial hemoglobin 4.7. P:  Continue serial hemoglobin. Transfuse as needed. Continue IV PPI.  HEMATOLOGIC A: Blood loss anemia, transfuse as needed. -Acute pulmonary emboli, anticoagulation is contraindicated due to GI bleeding and recent surgery. -Leukocytosis, continue to monitor. We'll check Urine culture, sputum cultures, blood cultures are pending.  INFECTIOUS A: -- Micro/culture results:  BCx2 6/30: Negative thus far.  UC 7/1: Pending Sputum 7/1: Pending  Antibiotics: Zosyn  6/30>>  ENDOCRINE A: Blood glucose controlled P:  We'll continue to monitor sliding scale  NEUROLOGIC A: Currently intubated and sedated, RASS goal -1 --   MAJOR EVENTS/TEST RESULTS:   Best Practices  DVT Prophylaxis: SCD GI Prophylaxis: Propofol   ---------------------------------------   ----------------------------------------   Name: Frederick Perry MRN: [id number] DOB: 1955/09/16    ADMISSION DATE:  12/14/2015     SUBJECTIVE:   Pt currently on the ventilator, can not provide history or review of systems.   Review of Systems:  Cannot provide [place] the patient is currently intubated on the ventilator.   VITAL SIGNS: Temp:  [97.9 F (36.6 C)-98.8 F (37.1 C)] 98.7 F (37.1 C) (07/01 0800) Pulse Rate:  [43-100] 76 (07/01 0855) Resp:  [0-18] 16 (07/01 0911) BP: (80-116)/(57-70) 116/70 mmHg (07/01 0800) SpO2:  [93 %-100 %] 100 % (07/01 0938) Arterial Line BP: (68-109)/(44-75) 98/75 mmHg (07/01 0911) FiO2 (%):  [35 %-45 %] 35 % (07/01 0938) HEMODYNAMICS:   VENTILATOR SETTINGS: Vent Mode:  [-] PRVC FiO2 (%):  [35 %-45 %] 35 % Set Rate:  [15 bmp] 15 bmp Vt Set:  [500 mL] 500 mL PEEP:  [5 cmH20] 5 cmH20 INTAKE / OUTPUT:  Intake/Output Summary (Last 24 hours) at 12/15/15 1014 Last data filed at 12/15/15 0543  Gross per 24 hour  Intake 2409.01 ml  Output   2210 ml  Net 199.01 ml    PHYSICAL EXAMINATION: Physical Examination:   VS: BP 116/70 mmHg  Pulse 76  Temp(Src) 98.7 F (37.1 C) (Axillary)  Resp 16  Ht 6\' 1"  (1.854 m)  Wt 211 lb 1.6 oz (95.754 kg)  BMI 27.86 kg/m2  SpO2 100%  General Appearance: No distress  Neuro:without focal findings, mental status : Sedated HEENT: PERRLA,  Pulmonary: normal breath sounds  , reduced bilaterally CardiovascularNormal S1,S2.  No m/r/g.   Abdomen: Benign, Soft,  non-tender. Renal:  No costovertebral tenderness  GU:  Not performed at this time. Endocrine: No evident thyromegaly. Skin:   warm, no  rashes, no ecchymosis  Extremities: normal, no cyanosis, clubbing.   LABS:   LABORATORY PANEL:   CBC  Recent Labs Lab 12/15/15 0438  WBC 19.8*  HGB 8.3*  HCT 24.3*  PLT 202    Chemistries   Recent Labs Lab 12/15/15 0438  NA 138  K 4.6  CL 109  CO2 26  GLUCOSE 129*  BUN 16  CREATININE 1.19  CALCIUM 7.5*  MG 2.5*  PHOS 4.0  AST 24  ALT 10*  ALKPHOS 45  BILITOT 4.0*     Recent Labs Lab 12/14/15 1642 12/14/15 1943 12/14/15 2335 12/15/15 0331 12/15/15 0703 12/15/15 0919  GLUCAP 221* 194* 157* 146* 123* 121*    Recent Labs Lab 12/14/15 1100 12/15/15 0429  PHART 7.29* 7.32*  PCO2ART 48 51*  PO2ART 54* 130*    Recent Labs Lab 12/14/15 0300 12/15/15 0438  AST 24 24  ALT 8* 10*  ALKPHOS 74 45  BILITOT 0.3 4.0*  ALBUMIN 3.3* 2.8*    Cardiac Enzymes  Recent Labs Lab 12/14/15 0300  TROPONINI 0.07*    RADIOLOGY:  Dg Chest 1 View  12/15/2015  CLINICAL DATA:  Dyspnea EXAM: CHEST 1 VIEW COMPARISON:  12/14/2015 FINDINGS: Cardiac shadow is mildly enlarged. A right jugular line, endotracheal tube and nasogastric catheter are again seen and stable. The lungs are well aerated bilaterally. The right basilar atelectasis seen previously has resolved in the interval. No new focal abnormality is noted. IMPRESSION: Tubes and lines as described above.  No focal infiltrate is seen. Electronically Signed   By: Inez Catalina M.D.   On: 12/15/2015 07:21   US Venous Img Lower Bilateral  12/14/2015  CLINICAL DATA:  Bilateral lower extremity swelling. Pulmonary embolism on anticoagulation therapy. EXAM: BILATERAL LOWER EXTREMITY VENOUS DOPPLER ULTRASOUND TECHNIQUE: Gray-scale sonography with graded compression, as well as color Doppler and duplex ultrasound were performed to evaluate the lower extremity deep venous systems from the level of the common femoral vein and including the common femoral, femoral, profunda femoral, popliteal and calf veins including the  posterior tibial, peroneal and gastrocnemius veins when visible. The superficial great saphenous vein was also interrogated. Spectral Doppler was utilized to evaluate flow at rest and with distal augmentation maneuvers in the common femoral, femoral and popliteal veins. COMPARISON:  None. FINDINGS: RIGHT LOWER EXTREMITY Common Femoral Vein: No evidence of thrombus. Normal compressibility, respiratory phasicity and response to augmentation. Saphenofemoral Junction: No evidence of thrombus. Normal compressibility and flow on color Doppler imaging. Profunda Femoral Vein: No evidence of thrombus. Normal compressibility and flow on color Doppler imaging. Femoral Vein: Partially occlusive thrombus is seen involving the femoral vein. Popliteal Vein: Partially occlusive thrombus is seen involving the popliteal vein. Calf Veins: Partially occlusive thrombus is seen within the posterior tibial vein. Perineal vein is not well visualized. Superficial Great Saphenous Vein: No evidence of thrombus. Normal compressibility and flow on color Doppler imaging. Venous Reflux:  None. Other Findings:  None. LEFT LOWER EXTREMITY Common Femoral Vein: No evidence of thrombus. Normal compressibility, respiratory phasicity and response to augmentation. Saphenofemoral Junction: No evidence of thrombus. Normal compressibility and flow on color Doppler imaging. Profunda Femoral Vein: No evidence of thrombus. Normal compressibility and flow on color Doppler imaging. Femoral Vein: No evidence of thrombus. Normal compressibility, respiratory phasicity and response to augmentation. Popliteal Vein: No evidence of thrombus. Normal compressibility, respiratory phasicity  and response to augmentation. Calf Veins: No evidence of thrombus. Normal compressibility and flow on color Doppler imaging. Superficial Great Saphenous Vein: No evidence of thrombus. Normal compressibility and flow on color Doppler imaging. Venous Reflux:  None. Other Findings:  None.  IMPRESSION: Partially occlusive thrombus in the right lower extremity involving the femoral, popliteal, and posterior tibial veins. No evidence of left lower extremity DVT. Electronically Signed   By: Earle Gell M.D.   On: 12/14/2015 18:32   Dg Chest Port 1 View  12/14/2015  CLINICAL DATA:  Hypoxia. Central catheter placement. Exploratory laparotomy earlier today EXAM: PORTABLE CHEST 1 VIEW COMPARISON:  Study obtained earlier in the day FINDINGS: Endotracheal tube tip is 3.8 cm above the carina. Central catheter tip is in the superior cava. Nasogastric tube tip and side port below the diaphragm. No pneumothorax. There is patchy atelectasis in the right base. Lungs elsewhere clear. Heart is mildly enlarged with pulmonary vascularity within normal limits. Pneumoperitoneum is less well seen currently compared to earlier in the day. IMPRESSION: Tube and catheter positions as described without pneumothorax. Atelectatic change right base. Early pneumonia or aspiration in the right base cannot be excluded radiographically. Electronically Signed   By: Lowella Grip III M.D.   On: 12/14/2015 08:57   Dg Chest Port 1 View  12/14/2015  CLINICAL DATA:  Diffuse abdominal pain for over 1 week but worse in the past hour. EXAM: PORTABLE CHEST 1 VIEW COMPARISON:  09/05/2015 FINDINGS: There is free intraperitoneal air. Moderate unchanged cardiomegaly. The lungs are clear. Pulmonary vasculature is normal. IMPRESSION: Free intraperitoneal air. Electronically Signed   By: Andreas Newport M.D.   On: 12/14/2015 04:21   Ct Angio Abd/pel W/ And/or W/o  12/14/2015  CLINICAL DATA:  Diffuse abdominal pain for over week. EXAM: CTA ABDOMEN AND PELVIS wITHOUT AND WITH CONTRAST TECHNIQUE: Multidetector CT imaging of the abdomen and pelvis was performed using the standard protocol during bolus administration of intravenous contrast. Multiplanar reconstructed images and MIPs were obtained and reviewed to evaluate the vascular anatomy.  CONTRAST:  100 mL Isovue 370 intravenous COMPARISON:  09/06/2015 FINDINGS: There is free intraperitoneal air. Source of the free air is not conclusive, but there is mural edema at the gastric antrum, pylorus and duodenal bulb. This may be the site of a hollow viscus perforation. Small to moderate volume ascites. There are unremarkable appearances of the liver, gallbladder, bile ducts, pancreas, spleen, adrenals and kidneys. Ureters are unremarkable. The urinary bladder contains a large volume of irregular soft tissue which may represent blood. Alternatively, a urinary bladder neoplasm may be considered. There is a hiatal hernia. There is uncomplicated colonic diverticulosis. The abdominal aorta is normal in caliber with moderate atherosclerotic calcification. There is an IVC filter. There is no significant abnormality in the lower chest. There is no significant skeletal lesion. CT angiographic images demonstrate normal caliber of the abdominal aorta. The celiac, SMA and IMA are patent. Renal arteries are patent. Common iliac and external iliac arteries are patent. Review of the MIP and multiplanar reconstruction images confirms these findings. IMPRESSION: 1. Free intraperitoneal air. Mural edema at the gastric antrum, pylorus and duodenal bulb suggest this may be the location of the hollow viscus perforation. These results were called by telephone at the time of interpretation on 12/14/2015 at 4:05 am to Dr. Hinda Kehr , who verbally acknowledged these results. 2. Large volume blood or neoplasm within the urinary bladder. 3. Hiatal hernia. 4. Diverticulosis. 5. Small volume ascites. Electronically Signed  By: Andreas Newport M.D.   On: 12/14/2015 04:11       --Marda Stalker, MD.  ICU Pager: [phone] [facility] Office Number: [phone]  Patricia Pesa, M.D.  Vilinda Boehringer, M.D.  Merton Border, M.D  12/15/2015   Critical Care Attestation.  I have personally obtained  a history, examined the patient, evaluated laboratory and imaging results, formulated the assessment and plan and placed orders. The Patient requires high complexity decision making for assessment and support, frequent evaluation and titration of therapies, application of advanced monitoring technologies and extensive interpretation of multiple databases. The patient has critical illness that could lead imminently to failure of 1 or more organ systems and requires the highest level of physician preparedness to intervene.  Critical Care Time devoted to patient care services described in this note is 35 minutes and is exclusive of time spent in procedures.

## 2015-12-15 NOTE — Anesthesia Postprocedure Evaluation (Signed)
Anesthesia Post Note  Patient: Frederick Perry  Procedure(s) Performed: Procedure(s) (LRB): EXPLORATORY LAPAROTOMY, prepyloric gastric ulcer biopsy (N/A)  Patient location during evaluation: ICU Anesthesia Type: General Level of consciousness: sedated and responds to stimulation Pain management: pain level controlled Vital Signs Assessment: post-procedure vital signs reviewed and stable Respiratory status: patient on ventilator - see flowsheet for VS and patient remains intubated per anesthesia plan Cardiovascular status: stable Anesthetic complications: no Comments: Remains intubated, .35% O2 , 5 peep, follows commands.  Currently on Levophed, Fentanyl, Propofol infusions, BP [id number] Systolic.  Levophed has been tapered downward overnight. Continues to bleed from bladder per RN    Last Vitals:  Filed Vitals:   12/15/15 0400 12/15/15 0500  BP: 100/65 105/69  Pulse: 69 77  Temp: 37.1 C   Resp: 14 14    Last Pain:  Filed Vitals:   12/15/15 0543  PainSc: 8                  Jigar Zielke,  Kathe Becton

## 2015-12-15 NOTE — Progress Notes (Signed)
Pt extubated at 1616. Tolerated well, placed on 4 LPM via [place], 100% o2 sat, able to speak in complete sentences. Will continue to monitor.

## 2015-12-15 NOTE — Progress Notes (Signed)
CC: POD#1 s/p exlap, Graham patch repair of perforated ulcer Subjective: Remains sedated on vent, arousable to voice and follows verbal commands.  Denies complaints.  Per nursing, received 1u PRBC overnight, but otherwise no major events.  Weaning Levophed, currently at 82mcg.  Comfortable on fent/propofol for sedation.  Having constant hematuria but no clots, UOP adequate.    Objective: Vital signs in last 24 hours: Temp:  [97.9 F (36.6 C)-98.8 F (37.1 C)] 98.8 F (37.1 C) (07/01 0400) Pulse Rate:  [43-102] 77 (07/01 0500) Resp:  [0-20] 14 (07/01 0500) BP: (80-120)/(57-74) 105/69 mmHg (07/01 0500) SpO2:  [90 %-100 %] 98 % (07/01 0500) Arterial Line BP: (68-123)/(44-67) 84/65 mmHg (07/01 0500) FiO2 (%):  [35 %-45 %] 35 % (07/01 0454) Last BM Date:  (PTA)  Intake/Output from previous day: 06/30 0701 - 07/01 0700 In: 4110 [I.V.:2589; Blood:1041; NG/GT:30; IV Piggyback:450] Out: 2535 [Urine:2130; Drains:355; Blood:50] Intake/Output this shift: Total I/O In: 1035.4 [I.V.:615.4; Blood:320; IV Piggyback:100] Out: [id number] [Urine:1230; Drains:140]  Physical exam:  Awake, sedated on vent, arousable to voice ETT in position, NGT in place Unlabored RRR Soft, mod TTP app to incision, min distended, dressings C/D/I, JP with purulent drainage 5/5 strength BUE   Lab Results: CBC   Recent Labs  12/14/15 0915 12/14/15 1816 12/15/15 0438  WBC 8.4  --  19.8*  HGB 8.6* 7.6* 8.3*  HCT 25.6* 23.4* 24.3*  PLT 244  --  202   BMET  Recent Labs  12/14/15 0915 12/15/15 0438  NA 137 138  K 4.2 4.6  CL 106 109  CO2 23 26  GLUCOSE 179* 129*  BUN 16 16  CREATININE 1.51* 1.19  CALCIUM 7.7* 7.5*   PT/INR  Recent Labs  12/14/15 0312  LABPROT 15.7*  INR 1.23   ABG  Recent Labs  12/14/15 1100 12/15/15 0429  PHART 7.29* 7.32*  HCO3 23.1 26.3    Studies/Results: US Venous Img Lower Bilateral  12/14/2015  CLINICAL DATA:  Bilateral lower extremity swelling. Pulmonary  embolism on anticoagulation therapy. EXAM: BILATERAL LOWER EXTREMITY VENOUS DOPPLER ULTRASOUND TECHNIQUE: Gray-scale sonography with graded compression, as well as color Doppler and duplex ultrasound were performed to evaluate the lower extremity deep venous systems from the level of the common femoral vein and including the common femoral, femoral, profunda femoral, popliteal and calf veins including the posterior tibial, peroneal and gastrocnemius veins when visible. The superficial great saphenous vein was also interrogated. Spectral Doppler was utilized to evaluate flow at rest and with distal augmentation maneuvers in the common femoral, femoral and popliteal veins. COMPARISON:  None. FINDINGS: RIGHT LOWER EXTREMITY Common Femoral Vein: No evidence of thrombus. Normal compressibility, respiratory phasicity and response to augmentation. Saphenofemoral Junction: No evidence of thrombus. Normal compressibility and flow on color Doppler imaging. Profunda Femoral Vein: No evidence of thrombus. Normal compressibility and flow on color Doppler imaging. Femoral Vein: Partially occlusive thrombus is seen involving the femoral vein. Popliteal Vein: Partially occlusive thrombus is seen involving the popliteal vein. Calf Veins: Partially occlusive thrombus is seen within the posterior tibial vein. Perineal vein is not well visualized. Superficial Great Saphenous Vein: No evidence of thrombus. Normal compressibility and flow on color Doppler imaging. Venous Reflux:  None. Other Findings:  None. LEFT LOWER EXTREMITY Common Femoral Vein: No evidence of thrombus. Normal compressibility, respiratory phasicity and response to augmentation. Saphenofemoral Junction: No evidence of thrombus. Normal compressibility and flow on color Doppler imaging. Profunda Femoral Vein: No evidence of thrombus. Normal compressibility and flow on color  Doppler imaging. Femoral Vein: No evidence of thrombus. Normal compressibility, respiratory  phasicity and response to augmentation. Popliteal Vein: No evidence of thrombus. Normal compressibility, respiratory phasicity and response to augmentation. Calf Veins: No evidence of thrombus. Normal compressibility and flow on color Doppler imaging. Superficial Great Saphenous Vein: No evidence of thrombus. Normal compressibility and flow on color Doppler imaging. Venous Reflux:  None. Other Findings:  None. IMPRESSION: Partially occlusive thrombus in the right lower extremity involving the femoral, popliteal, and posterior tibial veins. No evidence of left lower extremity DVT. Electronically Signed   By: Earle Gell M.D.   On: 12/14/2015 18:32   Dg Chest Port 1 View  12/14/2015  CLINICAL DATA:  Hypoxia. Central catheter placement. Exploratory laparotomy earlier today EXAM: PORTABLE CHEST 1 VIEW COMPARISON:  Study obtained earlier in the day FINDINGS: Endotracheal tube tip is 3.8 cm above the carina. Central catheter tip is in the superior cava. Nasogastric tube tip and side port below the diaphragm. No pneumothorax. There is patchy atelectasis in the right base. Lungs elsewhere clear. Heart is mildly enlarged with pulmonary vascularity within normal limits. Pneumoperitoneum is less well seen currently compared to earlier in the day. IMPRESSION: Tube and catheter positions as described without pneumothorax. Atelectatic change right base. Early pneumonia or aspiration in the right base cannot be excluded radiographically. Electronically Signed   By: Lowella Grip III M.D.   On: 12/14/2015 08:57   Dg Chest Port 1 View  12/14/2015  CLINICAL DATA:  Diffuse abdominal pain for over 1 week but worse in the past hour. EXAM: PORTABLE CHEST 1 VIEW COMPARISON:  09/05/2015 FINDINGS: There is free intraperitoneal air. Moderate unchanged cardiomegaly. The lungs are clear. Pulmonary vasculature is normal. IMPRESSION: Free intraperitoneal air. Electronically Signed   By: Andreas Newport M.D.   On: 12/14/2015 04:21    Ct Angio Abd/pel W/ And/or W/o  12/14/2015  CLINICAL DATA:  Diffuse abdominal pain for over week. EXAM: CTA ABDOMEN AND PELVIS wITHOUT AND WITH CONTRAST TECHNIQUE: Multidetector CT imaging of the abdomen and pelvis was performed using the standard protocol during bolus administration of intravenous contrast. Multiplanar reconstructed images and MIPs were obtained and reviewed to evaluate the vascular anatomy. CONTRAST:  100 mL Isovue 370 intravenous COMPARISON:  09/06/2015 FINDINGS: There is free intraperitoneal air. Source of the free air is not conclusive, but there is mural edema at the gastric antrum, pylorus and duodenal bulb. This may be the site of a hollow viscus perforation. Small to moderate volume ascites. There are unremarkable appearances of the liver, gallbladder, bile ducts, pancreas, spleen, adrenals and kidneys. Ureters are unremarkable. The urinary bladder contains a large volume of irregular soft tissue which may represent blood. Alternatively, a urinary bladder neoplasm may be considered. There is a hiatal hernia. There is uncomplicated colonic diverticulosis. The abdominal aorta is normal in caliber with moderate atherosclerotic calcification. There is an IVC filter. There is no significant abnormality in the lower chest. There is no significant skeletal lesion. CT angiographic images demonstrate normal caliber of the abdominal aorta. The celiac, SMA and IMA are patent. Renal arteries are patent. Common iliac and external iliac arteries are patent. Review of the MIP and multiplanar reconstruction images confirms these findings. IMPRESSION: 1. Free intraperitoneal air. Mural edema at the gastric antrum, pylorus and duodenal bulb suggest this may be the location of the hollow viscus perforation. These results were called by telephone at the time of interpretation on 12/14/2015 at 4:05 am to Dr. Hinda Kehr , who verbally  acknowledged these results. 2. Large volume blood or neoplasm within the  urinary bladder. 3. Hiatal hernia. 4. Diverticulosis. 5. Small volume ascites. Electronically Signed   By: Andreas Newport M.D.   On: 12/14/2015 04:11    Anti-infectives: Anti-infectives    Start     Dose/Rate Route Frequency Ordered Stop   12/14/15 1100  piperacillin-tazobactam (ZOSYN) IVPB 3.375 g     3.375 g 12.5 mL/hr over 240 Minutes Intravenous Every 8 hours 12/14/15 0849     12/14/15 0400  piperacillin-tazobactam (ZOSYN) IVPB 3.375 g     3.375 g 100 mL/hr over 30 Minutes Intravenous  Once 12/14/15 0359 12/14/15 0547      Assessment/Plan:  89M in critical condition POD#1 s/p exlap, Frederick Perry patch repair perforated ulcer  CV: BP improving, able to wean levophed currently at 39mcg, continue to wean as able.  HR wnl.   Pulm: ABG improved this AM, currently on 35% FiO2, vent mgmt per ICU team, wean as able FEN/GI: keep NGT.  TBili elevated this AM, likely due to administration of blood products.  Cr wnl, UOP ok Heme: Hgb 8.3 this AM, last received products last night.  Is having persistent hematuria, likely secondary to bladder mass seen on CT scan.  This is most likely the cause of patients anemia on presentation, as it is unlikely that he was having significant bleeding from his PUD.  Would recommend urology consult and appreciate their input ID: WBC elevated today but is early postop, continue broad spectrum abx, there was significant contamination at time of surgery, some purulent drainage in JP drain, continue to bulb suction  Mali Tadhg Eskew MD 12/15/2015

## 2015-12-15 NOTE — Progress Notes (Signed)
[facility] Consultation     ASSESSMENT/PLAN   This Is a 60 year old male, history of NTEMI, known but undiagnosed bladder mass, recent pulmonary embolus and status post IVC culture placement in March 2017, now presents with acute GI bleeding secondary to perforated gastric ulcer with initial hemorrhagic shock. Patient underwent a exploratory laparotomy was intubated for the procedure, and is extubated 7/1, he is now weaned off of pressors.  PULMONARY A: Acute vent dependent respiratory failure with hypoxia-Now extubated 7/1 -Mild right middle lobe atelectasis. 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    P:   -Continue supplemental O2 [place] prn to keep SPO2>90% -Patient already has IVC filter. -The patient was not on anticoagulation at the time of admission due to social issues, continue to hold anticoagulation due to GI bleeding. 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  CARDIOVASCULAR A:  Hypotension due to hemorrhagic shock-now off pressors; BP stable. H/o Hypertension P:  -Hemodynamics per ICU -iv FLUIDS  RENAL A:   Acute kidney injury 2/2 shock-resolving P:   -Continue IV hydration -Trend creatinine  GASTROINTESTINAL A:   Perforated gastric ulcer with blood loss anemia s/p exploratory laparotomy and repair of perforated ulcer  P:   -Continue serial hemoglobin. Transfuse as needed. -Continue IV PPI. -OOB to chair -Diet per surgical team.  HEMATOLOGIC A:   Blood loss anemia, transfuse as needed.Initial hemoglobin 4.7 -Status post transfusion of multiple blood products. Pulmonary emboli, anticoagulation is contraindicated due to GI bleeding and recent surgery. P: -Monitor CBC and transfuse per protocol -Right lower extremity DVT, possibly chronic.  INFECTIOUS DISEASE A:   Leukocytosis-Patient is afebrile P: -Continue  antibiotics  Micro/culture results:  BCx2  UC  Sputum  Antibiotics: Zosyn 6/30>>  ENDOCRINE A:  T2DM P:   -Continue point of care glucose testing with sliding scale insulin coverage   Best Practices  DVT Prophylaxis: SCD GI Prophylaxis: Propofol   ---------------------------------------   Name: Frederick Perry MRN: [id number] DOB: [birth year]    ADMISSION DATE:  12/14/2015 CONSULTATION DATE:  12/14/15   SUBJECTIVE: Doing well post-extubation. Off pressors and blood pressure stable. WBC trending up; no fever. Foley still draining blood tinged urine. Dopplers of the lower extremities done 6/30 revealed a partially occlusive thrombus in the right lower extremity involving the femoral, popliteal and posterior tibialis veins. Denies chest pain, difficulty breathing, cough, nausea and difficulty swallowing.  VITAL SIGNS: Temp:  [97.7 F (36.5 C)-98.6 F (37 C)] 97.9 F (36.6 C) (07/01 0021) Pulse Rate:  [43-118] 67 (07/01 0100) Resp:  [0-32] 15 (07/01 0100) BP: (80-149)/(41-98) 97/65 mmHg (07/01 0100) SpO2:  [80 %-100 %] 100 % (07/01 0100) Arterial Line BP: (68-123)/(44-67) 88/59 mmHg (07/01 0100) FiO2 (%):  [35 %-45 %] 45 % (07/01 0023) Weight:  [211 lb 1.6 oz (95.754 kg)] 211 lb 1.6 oz (95.754 kg) (06/30 0219)   HEMODYNAMICS:   VENTILATOR SETTINGS: Vent Mode:  [-] PRVC FiO2 (%):  [35 %-45 %] 45 % Set Rate:  [15 bmp] 15 bmp Vt Set:  [500 mL] 500 mL PEEP:  [5 cmH20] 5 cmH20 Plateau Pressure:  [17 cmH20] 17 cmH20 INTAKE / OUTPUT:  Intake/Output Summary (Last 24 hours) at 12/15/15 0123 Last data filed at 12/15/15 0111  Gross per 24 hour  Intake 5485.41 ml  Output   1575 ml  Net 3910.41 ml    Physical Examination:   VS: BP 97/65 mmHg  Pulse 67  Temp(Src) 97.9 F (36.6 C) (Axillary)  Resp 15  Ht 6\' 1"  (1.854 m)  Wt 211 lb 1.6 oz (95.754 kg)  BMI 27.86 kg/m2  SpO2 100%  General Appearance: No distress  Neuro:AAO X3, speech is normal, no focal  deficits HEENT: PERRLA, oral mucosa moist and pink Pulmonary: Bilateral breath sounds, scattered rhonchi anterior lung fields Cardiovascular: Irregular, normal S1,S2.  No m/r/g.    Abdomen: Benign. Midline incision with intact dressing Renal:  No costovertebral tenderness  GU: foley draining bloody urine Endoc: No evident thyromegaly, no signs of acromegaly. Skin:   warm, no rashes, no ecchymosis  Extremities: normal, no cyanosis, clubbing, no edema, warm with normal capillary refill.   LABS: Reviewed   LABORATORY PANEL:   CBC  Recent Labs Lab 12/14/15 0915 12/14/15 1816  WBC 8.4  --   HGB 8.6* 7.6*  HCT 25.6* 23.4*  PLT 244  --     Chemistries   Recent Labs Lab 12/14/15 0300 12/14/15 0915  NA 136 137  K 3.5 4.2  CL 103 106  CO2 17* 23  GLUCOSE 213* 179*  BUN 15 16  CREATININE 1.39* 1.51*  CALCIUM 8.5* 7.7*  MG  --  1.6*  PHOS  --  3.8  AST 24  --   ALT 8*  --   ALKPHOS 74  --   BILITOT 0.3  --      Recent Labs Lab 12/14/15 0821 12/14/15 1124 12/14/15 1642 12/14/15 1943 12/14/15 2335  GLUCAP 171* 206* 221* 194* 157*    Recent Labs Lab 12/14/15 1100  PHART 7.29*  PCO2ART 48  PO2ART 54*    Recent Labs Lab 12/14/15 0300  AST 24  ALT 8*  ALKPHOS 74  BILITOT 0.3  ALBUMIN 3.3*    Cardiac Enzymes  Recent Labs Lab 12/14/15 0300  TROPONINI 0.07*    RADIOLOGY:  US Venous Img Lower Bilateral  12/14/2015  CLINICAL DATA:  Bilateral lower extremity swelling. Pulmonary embolism on anticoagulation therapy. EXAM: BILATERAL LOWER EXTREMITY VENOUS DOPPLER ULTRASOUND TECHNIQUE: Gray-scale sonography with graded compression, as well as color Doppler and duplex ultrasound were performed to evaluate the lower extremity deep venous systems from the level of the common femoral vein and including the common femoral, femoral, profunda femoral, popliteal and calf veins including the posterior tibial, peroneal and gastrocnemius veins when visible. The  superficial great saphenous vein was also interrogated. Spectral Doppler was utilized to evaluate flow at rest and with distal augmentation maneuvers in the common femoral, femoral and popliteal veins. COMPARISON:  None. FINDINGS: RIGHT LOWER EXTREMITY Common Femoral Vein: No evidence of thrombus. Normal compressibility, respiratory phasicity and response to augmentation. Saphenofemoral Junction: No evidence of thrombus. Normal compressibility and flow on color Doppler imaging. Profunda Femoral Vein: No evidence of thrombus. Normal compressibility and flow on color Doppler imaging. Femoral Vein: Partially occlusive thrombus is seen involving the femoral vein. Popliteal Vein: Partially occlusive thrombus is seen involving the popliteal vein. Calf Veins: Partially occlusive thrombus is seen within the posterior tibial vein. Perineal vein is not well visualized. Superficial Great Saphenous Vein: No evidence of thrombus. Normal compressibility and flow on color Doppler imaging. Venous Reflux:  None. Other Findings:  None. LEFT LOWER EXTREMITY Common Femoral Vein: No evidence of thrombus. Normal compressibility, respiratory phasicity and response to augmentation. Saphenofemoral Junction: No evidence of thrombus. Normal compressibility and flow on color Doppler imaging. Profunda Femoral Vein: No evidence of thrombus. Normal compressibility and flow on color Doppler imaging. Femoral Vein:  No evidence of thrombus. Normal compressibility, respiratory phasicity and response to augmentation. Popliteal Vein: No evidence of thrombus. Normal compressibility, respiratory phasicity and response to augmentation. Calf Veins: No evidence of thrombus. Normal compressibility and flow on color Doppler imaging. Superficial Great Saphenous Vein: No evidence of thrombus. Normal compressibility and flow on color Doppler imaging. Venous Reflux:  None. Other Findings:  None. IMPRESSION: Partially occlusive thrombus in the right lower extremity  involving the femoral, popliteal, and posterior tibial veins. No evidence of left lower extremity DVT. Electronically Signed   By: Earle Gell M.D.   On: 12/14/2015 18:32   Dg Chest Port 1 View  12/14/2015  CLINICAL DATA:  Hypoxia. Central catheter placement. Exploratory laparotomy earlier today EXAM: PORTABLE CHEST 1 VIEW COMPARISON:  Study obtained earlier in the day FINDINGS: Endotracheal tube tip is 3.8 cm above the carina. Central catheter tip is in the superior cava. Nasogastric tube tip and side port below the diaphragm. No pneumothorax. There is patchy atelectasis in the right base. Lungs elsewhere clear. Heart is mildly enlarged with pulmonary vascularity within normal limits. Pneumoperitoneum is less well seen currently compared to earlier in the day. IMPRESSION: Tube and catheter positions as described without pneumothorax. Atelectatic change right base. Early pneumonia or aspiration in the right base cannot be excluded radiographically. Electronically Signed   By: Lowella Grip III M.D.   On: 12/14/2015 08:57   Dg Chest Port 1 View  12/14/2015  CLINICAL DATA:  Diffuse abdominal pain for over 1 week but worse in the past hour. EXAM: PORTABLE CHEST 1 VIEW COMPARISON:  09/05/2015 FINDINGS: There is free intraperitoneal air. Moderate unchanged cardiomegaly. The lungs are clear. Pulmonary vasculature is normal. IMPRESSION: Free intraperitoneal air. Electronically Signed   By: Andreas Newport M.D.   On: 12/14/2015 04:21   Ct Angio Abd/pel W/ And/or W/o  12/14/2015  CLINICAL DATA:  Diffuse abdominal pain for over week. EXAM: CTA ABDOMEN AND PELVIS wITHOUT AND WITH CONTRAST TECHNIQUE: Multidetector CT imaging of the abdomen and pelvis was performed using the standard protocol during bolus administration of intravenous contrast. Multiplanar reconstructed images and MIPs were obtained and reviewed to evaluate the vascular anatomy. CONTRAST:  100 mL Isovue 370 intravenous COMPARISON:  09/06/2015  FINDINGS: There is free intraperitoneal air. Source of the free air is not conclusive, but there is mural edema at the gastric antrum, pylorus and duodenal bulb. This may be the site of a hollow viscus perforation. Small to moderate volume ascites. There are unremarkable appearances of the liver, gallbladder, bile ducts, pancreas, spleen, adrenals and kidneys. Ureters are unremarkable. The urinary bladder contains a large volume of irregular soft tissue which may represent blood. Alternatively, a urinary bladder neoplasm may be considered. There is a hiatal hernia. There is uncomplicated colonic diverticulosis. The abdominal aorta is normal in caliber with moderate atherosclerotic calcification. There is an IVC filter. There is no significant abnormality in the lower chest. There is no significant skeletal lesion. CT angiographic images demonstrate normal caliber of the abdominal aorta. The celiac, SMA and IMA are patent. Renal arteries are patent. Common iliac and external iliac arteries are patent. Review of the MIP and multiplanar reconstruction images confirms these findings. IMPRESSION: 1. Free intraperitoneal air. Mural edema at the gastric antrum, pylorus and duodenal bulb suggest this may be the location of the hollow viscus perforation. These results were called by telephone at the time of interpretation on 12/14/2015 at 4:05 am to Dr. Hinda Kehr , who verbally acknowledged these results. 2.  Large volume blood or neoplasm within the urinary bladder. 3. Hiatal hernia. 4. Diverticulosis. 5. Small volume ascites. Electronically Signed   By: Andreas Newport M.D.   On: 12/14/2015 04:11    Patient's care transitioned to Hospitalist team for further management. Spoke with Dr. Marcille Blanco at 06:15am. Defer to surgical team regarding transfer out of ICU  [facility] ANP-BC Pulmonary and Critical Care Medicine [facility] Pager [phone] or [phone]   12/15/2015, 1:23 AM  Patient seen  and examined with NP, agree with the assessment and plan. Status post exploratory laparoscopy for perforated gastric ulcer, initially presented with hemorrhagic shock, now improved. Extubated yesterday and appears to be doing well. We'll transfer. Medical management to hospitalist service. PCCM service will sign off at this time, please call if there are any further questions or concerns.   Marda Stalker, M.D.   12/16/2015

## 2015-12-16 ENCOUNTER — Inpatient Hospital Stay: Payer: Medicaid Other

## 2015-12-16 LAB — PHOSPHORUS: Phosphorus: 2.8 mg/dL (ref 2.5–4.6)

## 2015-12-16 LAB — CBC
HEMATOCRIT: 23.9 % — AB (ref 40.0–52.0)
HEMOGLOBIN: 8.1 g/dL — AB (ref 13.0–18.0)
MCH: 28.5 pg (ref 26.0–34.0)
MCHC: 33.7 g/dL (ref 32.0–36.0)
MCV: 84.6 fL (ref 80.0–100.0)
Platelets: 181 10*3/uL (ref 150–440)
RBC: 2.82 MIL/uL — ABNORMAL LOW (ref 4.40–5.90)
RDW: 17.3 % — ABNORMAL HIGH (ref 11.5–14.5)
WBC: 20.4 10*3/uL — ABNORMAL HIGH (ref 3.8–10.6)

## 2015-12-16 LAB — GLUCOSE, CAPILLARY
Glucose-Capillary: 115 mg/dL — ABNORMAL HIGH (ref 65–99)
Glucose-Capillary: 120 mg/dL — ABNORMAL HIGH (ref 65–99)
Glucose-Capillary: 139 mg/dL — ABNORMAL HIGH (ref 65–99)
Glucose-Capillary: 100 mg/dL — ABNORMAL HIGH (ref 65–99)
Glucose-Capillary: 144 mg/dL — ABNORMAL HIGH (ref 65–99)

## 2015-12-16 LAB — BASIC METABOLIC PANEL
Anion gap: 4 — ABNORMAL LOW (ref 5–15)
BUN: 14 mg/dL (ref 6–20)
CHLORIDE: 105 mmol/L (ref 101–111)
CO2: 28 mmol/L (ref 22–32)
CREATININE: 0.96 mg/dL (ref 0.61–1.24)
Calcium: 7.9 mg/dL — ABNORMAL LOW (ref 8.9–10.3)
GFR calc Af Amer: >60 mL/min (ref >60)
GFR calc non Af Amer: >60 mL/min (ref >60)
Glucose, Bld: 117 mg/dL — ABNORMAL HIGH (ref 65–99)
Potassium: 4 mmol/L (ref 3.5–5.1)
Sodium: 137 mmol/L (ref 135–145)

## 2015-12-16 LAB — MAGNESIUM: Magnesium: 2.1 mg/dL (ref 1.7–2.4)

## 2015-12-16 MED ORDER — SODIUM CHLORIDE 0.9% FLUSH
10.0000 mL | Freq: Two times a day (BID) | INTRAVENOUS | Status: DC
  Administered 2015-12-16 – 2015-12-22 (×11): 10 mL via INTRAVENOUS

## 2015-12-16 MED ORDER — SODIUM CHLORIDE 0.9% FLUSH
10.0000 mL | Freq: Two times a day (BID) | INTRAVENOUS | Status: DC
  Administered 2015-12-16 – 2015-12-22 (×12): 10 mL via INTRAVENOUS

## 2015-12-16 MED ORDER — SODIUM CHLORIDE 0.9% FLUSH
10.0000 mL | Freq: Two times a day (BID) | INTRAVENOUS | Status: DC
  Administered 2015-12-16 – 2015-12-22 (×10): 10 mL via INTRAVENOUS

## 2015-12-16 MED ORDER — MORPHINE SULFATE (PF) 2 MG/ML IV SOLN: 2.0000 mg | INTRAVENOUS | Status: DC | PRN

## 2015-12-16 NOTE — Progress Notes (Signed)
Pharmacy Antibiotic Note  Frederick Perry is a 60 y.o. male admitted on 12/14/2015 with perforated bowel s/p laparotomy.  Pharmacy has been consulted for Zosyn dosing.  Plan: Patient received Zosyn 3.375 g iv once at 0600. Will begin Zosyn 3.375g IV q8h (4 hour infusion) 6 hours after initial dose.   Height: 6\' 1"  (185.4 cm) Weight: 211 lb 1.6 oz (95.754 kg) IBW/kg (Calculated) : 79.9  Temp (24hrs), Avg:98.9 F (37.2 C), Min:98.5 F (36.9 C), Max:99.8 F (37.7 C)   Recent Labs Lab 12/14/15 0300 12/14/15 0312 12/14/15 0915 12/15/15 0438 12/16/15 0518  WBC 14.7*  --  8.4 19.8* 20.4*  CREATININE 1.39*  --  1.51* 1.19 0.96  LATICACIDVEN  --  9.5*  --   --   --     Estimated Creatinine Clearance: 94.8 mL/min (by C-G formula based on Cr of 0.96).    No Known Allergies  Antimicrobials this admission: Zosyn 6/30 >>   Dose adjustments this admission:  Microbiology results: 6/30 BCx: ng x 2d 6/30 UCx: pending  7/1- trach aspirate cx: 7/1 MRSA PCR neg.   Thank you for allowing pharmacy to be a part of this patient's care.  Maryjayne Kleven A 12/16/2015 2:17 PM

## 2015-12-16 NOTE — Progress Notes (Signed)
PHARMACY ELECTROLYTE CONSULT NOTE - follow-up  Pharmacy Consult for Electrolyte replacement Indication: hypomagnesia  No Known Allergies  Patient Measurements: Height: '6\' 1"'  (185.4 cm) Weight: 211 lb 1.6 oz (95.754 kg) IBW/kg (Calculated) : 79.9 Adjusted Body Weight:    Vital Signs: Temp: 99.8 F (37.7 C) (07/02 0500) Temp Source: Oral (07/02 0500) BP: 133/87 mmHg (07/02 0500) Pulse Rate: 86 (07/02 0400) Intake/Output from previous day: 07/01 0701 - 07/02 0700 In: 5056.1 [I.V.:4956.1; IV Piggyback:100] Out: 7078 [Urine:1700; Emesis/NG output:45; Drains:92] Intake/Output from this shift:    Labs:  Recent Labs  12/14/15 0312 12/14/15 0915 12/14/15 1816 12/15/15 0438 12/16/15 0518  WBC  --  8.4  --  19.8* 20.4*  HGB  --  8.6* 7.6* 8.3* 8.1*  HCT  --  25.6* 23.4* 24.3* 23.9*  PLT  --  244  --  202 181  APTT 28  --   --   --   --   INR 1.23  --   --   --   --      Recent Labs  12/14/15 0300 12/14/15 0915 12/15/15 0438 12/16/15 0518  NA 136 137 138 137  K 3.5 4.2 4.6 4.0  CL 103 106 109 105  CO2 17* '23 26 28  ' GLUCOSE 213* 179* 129* 117*  BUN '15 16 16 14  ' CREATININE 1.39* 1.51* 1.19 0.96  CALCIUM 8.5* 7.7* 7.5* 7.9*  MG  --  1.6* 2.5* 2.1  PHOS  --  3.8 4.0 2.8  PROT 6.9  --  5.6*  --   ALBUMIN 3.3*  --  2.8*  --   AST 24  --  24  --   ALT 8*  --  10*  --   ALKPHOS 74  --  45  --   BILITOT 0.3  --  4.0*  --   TRIG  --  146  --   --    Estimated Creatinine Clearance: 94.8 mL/min (by C-G formula based on Cr of 0.96).    Recent Labs  12/15/15 2331 12/16/15 0337 12/16/15 0652  GLUCAP 110* 115* 120*    Medical History: Past Medical History  Diagnosis Date  . Diabetes mellitus without complication ([place])     Not on medications  . Hypertension     Not on medications  . Polysubstance abuse     a. ongoing tobacco and alcohol abuse     Medications:  Scheduled:  . antiseptic oral rinse  7 mL Mouth Rinse 10 times per day  . chlorhexidine  gluconate (SAGE KIT)  15 mL Mouth Rinse BID  . famotidine (PEPCID) IV  20 mg Intravenous Q12H  . insulin aspart  0-15 Units Subcutaneous Q4H  . piperacillin-tazobactam (ZOSYN)  IV  3.375 g Intravenous Q8H      Assessment: 60 yo male status post exploratory laparoscopy due to gastric ulcer complicated by hemorrhagic shock, currently intubated and ventilated. Acute PE  6/30:  Mag= 1.6 K=4.2 Phos 3.8  (received Mag 4 gram IV x 1)  Plan:  7/1- K=4.6, Mag=2.5, Phos= 4.0 No supplementtion required. F/u electrolytes in am.  7/2- K=4.0, Mag=2.1, Phos=2.8. No supplementtion required. F/u electrolytes in am.  Harrietta Incorvaia A 12/16/2015,9:30 AM

## 2015-12-16 NOTE — Progress Notes (Signed)
CC: POD#1 s/p exlap, Graham patch repair of perforated ulcer Subjective: Extubated yesterday, awake and alert today, reports pain well controlled.  Denies bowel function, denies N/V.  Afebrile.    Objective: Vital signs in last 24 hours: Temp:  [98.5 F (36.9 C)-99.8 F (37.7 C)] 99.8 F (37.7 C) (07/02 0500) Pulse Rate:  [74-88] 86 (07/02 0400) Resp:  [10-20] 12 (07/02 0500) BP: (103-159)/(64-97) 133/87 mmHg (07/02 0500) SpO2:  [98 %-100 %] 100 % (07/02 0400) Arterial Line BP: (79-169)/(56-87) 144/69 mmHg (07/02 0500) FiO2 (%):  [35 %] 35 % (07/01 1600) Last BM Date:  (PTA)  Intake/Output from previous day: 07/01 0701 - 07/02 0700 In: 5056.1 [I.V.:4956.1; IV Piggyback:100] Out: [id number] [Urine:1700; Emesis/NG output:45; Drains:92] Intake/Output this shift:    Physical exam:  Awake, alert, NAD  NGT in place Unlabored RRR Soft, mod TTP app to incision, min distended, dressings C/D/I, JP with purulent drainage 5/5 strength BUE   Lab Results: CBC   Recent Labs  12/15/15 0438 12/16/15 0518  WBC 19.8* 20.4*  HGB 8.3* 8.1*  HCT 24.3* 23.9*  PLT 202 181   BMET  Recent Labs  12/15/15 0438 12/16/15 0518  NA 138 137  K 4.6 4.0  CL 109 105  CO2 26 28  GLUCOSE 129* 117*  BUN 16 14  CREATININE 1.19 0.96  CALCIUM 7.5* 7.9*   PT/INR  Recent Labs  12/14/15 0312  LABPROT 15.7*  INR 1.23   ABG  Recent Labs  12/14/15 1100 12/15/15 0429  PHART 7.29* 7.32*  HCO3 23.1 26.3    Studies/Results: Dg Chest 1 View  12/16/2015  CLINICAL DATA:  Dyspnea.  Postop from repair of perforated ulcer. EXAM: CHEST 1 VIEW COMPARISON:  12/15/2015 FINDINGS: Endotracheal tube is been removed. Right jugular central venous catheter and nasogastric tube remain in place. New airspace disease is seen in the left perihilar region and central left upper lobe, suspicious for pneumonia. Heart size is stable. Probable tiny right pleural effusion noted. No evidence of pneumothorax. IMPRESSION:  New airspace disease in left perihilar region and central left upper lobe, suspicious for pneumonia. Electronically Signed   By: Earle Gell M.D.   On: 12/16/2015 08:08   Dg Chest 1 View  12/15/2015  CLINICAL DATA:  Dyspnea EXAM: CHEST 1 VIEW COMPARISON:  12/14/2015 FINDINGS: Cardiac shadow is mildly enlarged. A right jugular line, endotracheal tube and nasogastric catheter are again seen and stable. The lungs are well aerated bilaterally. The right basilar atelectasis seen previously has resolved in the interval. No new focal abnormality is noted. IMPRESSION: Tubes and lines as described above.  No focal infiltrate is seen. Electronically Signed   By: Inez Catalina M.D.   On: 12/15/2015 07:21   US Venous Img Lower Bilateral  12/14/2015  CLINICAL DATA:  Bilateral lower extremity swelling. Pulmonary embolism on anticoagulation therapy. EXAM: BILATERAL LOWER EXTREMITY VENOUS DOPPLER ULTRASOUND TECHNIQUE: Gray-scale sonography with graded compression, as well as color Doppler and duplex ultrasound were performed to evaluate the lower extremity deep venous systems from the level of the common femoral vein and including the common femoral, femoral, profunda femoral, popliteal and calf veins including the posterior tibial, peroneal and gastrocnemius veins when visible. The superficial great saphenous vein was also interrogated. Spectral Doppler was utilized to evaluate flow at rest and with distal augmentation maneuvers in the common femoral, femoral and popliteal veins. COMPARISON:  None. FINDINGS: RIGHT LOWER EXTREMITY Common Femoral Vein: No evidence of thrombus. Normal compressibility, respiratory phasicity and response to  augmentation. Saphenofemoral Junction: No evidence of thrombus. Normal compressibility and flow on color Doppler imaging. Profunda Femoral Vein: No evidence of thrombus. Normal compressibility and flow on color Doppler imaging. Femoral Vein: Partially occlusive thrombus is seen involving the  femoral vein. Popliteal Vein: Partially occlusive thrombus is seen involving the popliteal vein. Calf Veins: Partially occlusive thrombus is seen within the posterior tibial vein. Perineal vein is not well visualized. Superficial Great Saphenous Vein: No evidence of thrombus. Normal compressibility and flow on color Doppler imaging. Venous Reflux:  None. Other Findings:  None. LEFT LOWER EXTREMITY Common Femoral Vein: No evidence of thrombus. Normal compressibility, respiratory phasicity and response to augmentation. Saphenofemoral Junction: No evidence of thrombus. Normal compressibility and flow on color Doppler imaging. Profunda Femoral Vein: No evidence of thrombus. Normal compressibility and flow on color Doppler imaging. Femoral Vein: No evidence of thrombus. Normal compressibility, respiratory phasicity and response to augmentation. Popliteal Vein: No evidence of thrombus. Normal compressibility, respiratory phasicity and response to augmentation. Calf Veins: No evidence of thrombus. Normal compressibility and flow on color Doppler imaging. Superficial Great Saphenous Vein: No evidence of thrombus. Normal compressibility and flow on color Doppler imaging. Venous Reflux:  None. Other Findings:  None. IMPRESSION: Partially occlusive thrombus in the right lower extremity involving the femoral, popliteal, and posterior tibial veins. No evidence of left lower extremity DVT. Electronically Signed   By: Earle Gell M.D.   On: 12/14/2015 18:32    Anti-infectives: Anti-infectives    Start     Dose/Rate Route Frequency Ordered Stop   12/14/15 1100  piperacillin-tazobactam (ZOSYN) IVPB 3.375 g     3.375 g 12.5 mL/hr over 240 Minutes Intravenous Every 8 hours 12/14/15 0849     12/14/15 0400  piperacillin-tazobactam (ZOSYN) IVPB 3.375 g     3.375 g 100 mL/hr over 30 Minutes Intravenous  Once 12/14/15 0359 12/14/15 0547      Assessment/Plan:  72M in critical condition POD#2 s/p exlap, Phillip Heal patch repair  perforated ulcer  CV: Off pressors, HR improved Pulm: on room air FEN/GI: keep NGT. Cr wnl, UOP ok.  Will be NPO for several more days, may need to consider TPN Heme: Hgb 8.1 this AM, stable.  Is having persistent hematuria, likely secondary to bladder mass seen on CT scan.  This is most likely the cause of patients anemia on presentation, as it is unlikely that he was having significant bleeding from his PUD.   ID: WBC elevated today , continue broad spectrum abx, there was significant contamination at time of surgery, some purulent drainage in JP drain, continue to bulb suction  Mali Roarke Marciano MD 12/16/2015

## 2015-12-16 NOTE — Progress Notes (Signed)
PT Cancellation Note  Patient Details Name: Frederick Perry MRN: [id number] DOB: 10-Nov-1955   Cancelled Treatment:    Reason Eval/Treat Not Completed: Patient declined, no reason specified Pt reports that he is too tired to work with PT today, agrees to try tomorrow when he has gotten some rest.   Kreg Shropshire 12/16/2015, 3:25 PM

## 2015-12-17 ENCOUNTER — Other Ambulatory Visit: Payer: Self-pay | Admitting: Nurse Practitioner

## 2015-12-17 DIAGNOSIS — D494 Neoplasm of unspecified behavior of bladder: Secondary | ICD-10-CM

## 2015-12-17 LAB — CBC
HCT: 23.3 % — ABNORMAL LOW (ref 40.0–52.0)
HEMOGLOBIN: 7.8 g/dL — AB (ref 13.0–18.0)
MCH: 28.4 pg (ref 26.0–34.0)
MCHC: 33.5 g/dL (ref 32.0–36.0)
MCV: 85 fL (ref 80.0–100.0)
Platelets: 169 10*3/uL (ref 150–440)
RBC: 2.74 MIL/uL — AB (ref 4.40–5.90)
RDW: 17.3 % — ABNORMAL HIGH (ref 11.5–14.5)
WBC: 17 10*3/uL — ABNORMAL HIGH (ref 3.8–10.6)

## 2015-12-17 LAB — PREPARE FRESH FROZEN PLASMA
UNIT DIVISION: 0
UNIT DIVISION: 0
Unit division: 0
Unit division: 0

## 2015-12-17 LAB — BASIC METABOLIC PANEL
ANION GAP: 4 — AB (ref 5–15)
BUN: 11 mg/dL (ref 6–20)
CHLORIDE: 104 mmol/L (ref 101–111)
CO2: 29 mmol/L (ref 22–32)
Calcium: 7.9 mg/dL — ABNORMAL LOW (ref 8.9–10.3)
Creatinine, Ser: 0.94 mg/dL (ref 0.61–1.24)
GFR calc non Af Amer: >60 mL/min (ref >60)
Glucose, Bld: 114 mg/dL — ABNORMAL HIGH (ref 65–99)
POTASSIUM: 3.6 mmol/L (ref 3.5–5.1)
SODIUM: 137 mmol/L (ref 135–145)

## 2015-12-17 LAB — GLUCOSE, CAPILLARY
Glucose-Capillary: 104 mg/dL — ABNORMAL HIGH (ref 65–99)
Glucose-Capillary: 123 mg/dL — ABNORMAL HIGH (ref 65–99)
Glucose-Capillary: 128 mg/dL — ABNORMAL HIGH (ref 65–99)
Glucose-Capillary: 129 mg/dL — ABNORMAL HIGH (ref 65–99)
Glucose-Capillary: 131 mg/dL — ABNORMAL HIGH (ref 65–99)
Glucose-Capillary: 142 mg/dL — ABNORMAL HIGH (ref 65–99)

## 2015-12-17 LAB — MAGNESIUM: MAGNESIUM: 1.7 mg/dL (ref 1.7–2.4)

## 2015-12-17 LAB — SURGICAL PATHOLOGY

## 2015-12-17 LAB — PREPARE RBC (CROSSMATCH)

## 2015-12-17 LAB — PHOSPHORUS: PHOSPHORUS: 2.8 mg/dL (ref 2.5–4.6)

## 2015-12-17 LAB — TRIGLYCERIDES: Triglycerides: 132 mg/dL (ref <150)

## 2015-12-17 MED ORDER — INSULIN ASPART 100 UNIT/ML ~~LOC~~ SOLN
0.0000 [IU] | Freq: Three times a day (TID) | SUBCUTANEOUS | Status: DC
  Administered 2015-12-18 – 2015-12-19 (×5): 2 [IU] via SUBCUTANEOUS
  Administered 2015-12-20: 3 [IU] via SUBCUTANEOUS
  Administered 2015-12-21 (×2): 2 [IU] via SUBCUTANEOUS
  Filled 2015-12-17 (×6): qty 2
  Filled 2015-12-17: qty 3

## 2015-12-17 MED ORDER — ACETAMINOPHEN 325 MG PO TABS
650.0000 mg | ORAL_TABLET | Freq: Four times a day (QID) | ORAL | Status: DC | PRN
  Administered 2015-12-17: 650 mg via ORAL
  Filled 2015-12-17: qty 2

## 2015-12-17 NOTE — Progress Notes (Signed)
Foley irrigated with NS again. Moved foley up. Able to pull back amount instilled. Frank bloody drainage out. Will continue to monitor

## 2015-12-17 NOTE — Progress Notes (Signed)
Per Dr. Burt Knack okay to place pt on ACHS sliding scale as pt is now on clear liquids

## 2015-12-17 NOTE — Progress Notes (Signed)
Nutrition Follow-up  DOCUMENTATION CODES:   Not applicable  INTERVENTION:  -Diet progression per MD -If pt tolerates diet advancement, pt would likely benefit from addition of nutritional supplement  NUTRITION DIAGNOSIS:   Inadequate oral intake related to acute illness as evidenced by NPO status.  Being addressed as diet ordered today  GOAL:   Patient will meet greater than or equal to 90% of their needs  MONITOR:   Diet advancement, PO intake, Labs, Weight trends  REASON FOR ASSESSMENT:   Malnutrition Screening Tool    ASSESSMENT:   60 yo male admitted with severe anemia, Hgb 4.0, found to have perforated prepyloric ulcer, s/p ex lap with repair of perforation on 6/30. Pt with hx of bladder mass as well  Pt s/p extubation over the weekend, diet just advanced to CL, NG to be discontinued  Diet Order:  Diet clear liquid Room service appropriate?: Yes; Fluid consistency:: Thin  Skin:  Reviewed, no issues  Last BM:  no BM, +flatus   Labs: reviewed  Meds: D5-LR at 125 ml/hr, ss novolog  Height:   Ht Readings from Last 1 Encounters:  12/14/15 6\' 1"  (1.854 m)    Weight:   Wt Readings from Last 1 Encounters:  12/14/15 211 lb 1.6 oz (95.754 kg)   BMI:  Body mass index is 27.86 kg/(m^2).  Estimated Nutritional Needs:   Kcal:  [id number] kcals   Protein:  >/= 115 g   Fluid:  >/= 2 L  EDUCATION NEEDS:   No education needs identified at this time  [place], [place], LDN [phone] Pager  [phone] Weekend/On-Call Pager

## 2015-12-17 NOTE — Progress Notes (Signed)
Physical Therapy Evaluation Patient Details Name: Frederick Perry MRN: [id number] DOB: 1956-04-18 Today's Date: 12/17/2015   History of Present Illness  Frederick Perry is a 60 y.o. male POD#2 s/p exlap, Phillip Heal patch repair of perforated ulcer.  Pt intubated 6/30-7/1 and has history of recent PT with IVC filter 08/2015.  Clinical Impression  Pt presents to PT with pain and generalized weakness from recent surgical procedure.  Pt is Mod I for bed mobility and transfers using RW, needing extra time to perform transfers due to abdominal pain.  Pt able to ambulate 300' with RW and supervision on room air with O2 97-99% at end of session.  Recommend pt to get up to chair daily with nursing and to ambulate with nursing using RW for comfort as needed.  No further skilled PT services at this time.    Follow Up Recommendations No PT follow up    Equipment Recommendations  None recommended by PT    Recommendations for Other Services       Precautions / Restrictions Precautions Precautions: Fall Precaution Comments: High Restrictions Weight Bearing Restrictions: No      Mobility  Bed Mobility Overal bed mobility: Modified Independent             General bed mobility comments: Supine<>sit; extra time to get back into bed due to lines/leads/catheter  Transfers Overall transfer level: Modified independent Equipment used: Rolling walker (2 wheeled)             General transfer comment: sit<>stand with RW, good lift off from bed with abdominal guarding due to pain  Ambulation/Gait Ambulation/Gait assistance: Modified independent (Device/Increase time) Ambulation Distance (Feet): 300 Feet Assistive device: Rolling walker (2 wheeled) Gait Pattern/deviations: WFL(Within Functional Limits)     General Gait Details: Slow cadence with forward lean on RW, RW used mostly for comfort at this time; initial dizziness, resolved quickly  Stairs            Wheelchair Mobility     Modified Rankin (Stroke Patients Only)       Balance Overall balance assessment: Modified Independent                                           Pertinent Vitals/Pain Pain Assessment: 0-10 Pain Location: abdomin Pain Descriptors / Indicators: Operative site guarding Pain Intervention(s): Limited activity within patient's tolerance    Home Living Family/patient expects to be discharged to:: Private residence Living Arrangements: Parent (elderly mother) Available Help at Discharge: Family;Available PRN/intermittently (son) Type of Home: House Home Access: Stairs to enter   [employer] of Steps: 3-4 Home Layout: One level Home Equipment: None      Prior Function Level of Independence: Independent         Comments: 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; cooks and Tax inspector, 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, newspaper        Extremity/Trunk Assessment   Upper Extremity Assessment: Overall WFL for tasks assessed           Lower Extremity Assessment: Overall WFL for tasks assessed         Communication   Communication: No difficulties  Cognition Arousal/Alertness: Awake/alert Behavior During Therapy: WFL for tasks assessed/performed Overall Cognitive Status: Within Functional Limits for tasks assessed  General Comments General comments (skin integrity, edema, etc.): NG tube to wall suction; buld drain; amb wihtout O2 and sats at 98-99% end of session    Exercises        Assessment/Plan    PT Assessment Patent does not need any further PT services  PT Diagnosis Generalized weakness   PT Problem List    PT Treatment Interventions     PT Goals (Current goals can be found in the Care Plan section) Acute Rehab PT Goals Patient Stated Goal: To return home. PT Goal Formulation: With patient Time For Goal Achievement: 12/24/15 Potential to Achieve Goals: Good    Frequency     Barriers to  discharge        Co-evaluation               End of Session Equipment Utilized During Treatment: Gait belt Activity Tolerance: Patient tolerated treatment well Patient left: in bed;with call bell/phone within reach;with family/visitor present Nurse Communication: Mobility status         Time: 1430-1458 PT Time Calculation (min) (ACUTE ONLY): 28 min   Charges:   PT Evaluation $PT Eval Low Complexity: 1 Procedure     PT G Codes:        Kayliee Atienza A Shawnay Bramel, PT 12/17/2015, 3:15 PM

## 2015-12-17 NOTE — Progress Notes (Signed)
3 Days Post-Op  Subjective: Febrile this morning but feels much better this afternoon. No nausea vomiting no chills is passing gas  Objective: Vital signs in last 24 hours: Temp:  [99.1 F (37.3 C)-101.3 F (38.5 C)] 99.1 F (37.3 C) (07/03 1344) Pulse Rate:  [94-108] 95 (07/03 1344) Resp:  [12-19] 19 (07/03 1344) BP: (128-166)/(72-94) 141/79 mmHg (07/03 1344) SpO2:  [95 %-100 %] 100 % (07/03 1344) Last BM Date:  (PTA)  Intake/Output from previous day: 07/02 0701 - 07/03 0700 In: -  Out: 895 [Urine:650; Emesis/NG output:175; Drains:70] Intake/Output this shift: Total I/O In: 999 [I.V.:882; NG/GT:60; IV Piggyback:57] Out: 120 [Urine:50; Emesis/NG output:50; Drains:20]  Physical exam:  Gross hematuria and Foley bag Abdomen is soft nontender wounds are clean  Lab Results: CBC   Recent Labs  12/16/15 0518 12/17/15 0415  WBC 20.4* 17.0*  HGB 8.1* 7.8*  HCT 23.9* 23.3*  PLT 181 169   BMET  Recent Labs  12/16/15 0518 12/17/15 0415  NA 137 137  K 4.0 3.6  CL 105 104  CO2 28 29  GLUCOSE 117* 114*  BUN 14 11  CREATININE 0.96 0.94  CALCIUM 7.9* 7.9*   PT/INR No results for input(s): LABPROT, INR in the last 72 hours. ABG  Recent Labs  12/15/15 0429  PHART 7.32*  HCO3 26.3    Studies/Results: Dg Chest 1 View  12/16/2015  CLINICAL DATA:  Dyspnea.  Postop from repair of perforated ulcer. EXAM: CHEST 1 VIEW COMPARISON:  12/15/2015 FINDINGS: Endotracheal tube is been removed. Right jugular central venous catheter and nasogastric tube remain in place. New airspace disease is seen in the left perihilar region and central left upper lobe, suspicious for pneumonia. Heart size is stable. Probable tiny right pleural effusion noted. No evidence of pneumothorax. IMPRESSION: New airspace disease in left perihilar region and central left upper lobe, suspicious for pneumonia. Electronically Signed   By: Earle Gell M.D.   On: 12/16/2015 08:08     Anti-infectives: Anti-infectives    Start     Dose/Rate Route Frequency Ordered Stop   12/14/15 1100  piperacillin-tazobactam (ZOSYN) IVPB 3.375 g     3.375 g 12.5 mL/hr over 240 Minutes Intravenous Every 8 hours 12/14/15 0849     12/14/15 0400  piperacillin-tazobactam (ZOSYN) IVPB 3.375 g     3.375 g 100 mL/hr over 30 Minutes Intravenous  Once 12/14/15 0359 12/14/15 0547      Assessment/Plan: s/p Procedure(s): EXPLORATORY LAPAROTOMY, prepyloric gastric ulcer biopsy   Will discontinue NG tube and start clear liquids today. Continue antibiotics  [name], MD, FACS  12/17/2015

## 2015-12-17 NOTE — Progress Notes (Addendum)
Pt has  Bloody urine leaking around catheter and no urine coming out of the catheter. Attempted to irrigate and could see clots but were unable to pull them out. Bladder scan was performed and patient had 343ml.Notified Dr. Burt Knack of problem and per Dr. Burt Knack okay to place order for Urology consult

## 2015-12-17 NOTE — Progress Notes (Signed)
Called urologuist on call and per Dr. Beatrix Fetters exchange out and place a 36 French foley catheter. Urologist will come to see patient at some point.

## 2015-12-17 NOTE — Progress Notes (Addendum)
[facility] [place] NAME: Frederick Perry    MR#:  [id number]  DATE OF BIRTH:  September 17, 1955  SUBJECTIVE:  CHIEF COMPLAINT:   Chief Complaint  Patient presents with  . 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 Was advised to follow-up with urology for further diagnosis of bladder mass, but he did not follow-up.  Came with complaint of abdominal pain and found to have perforated gastric ulcer with blood collection in peritoneum.   S/p surgery- on IV abx, Extubated, have fever.   Hematuria, and Intraabdominal blood loss- anemia.   No new complains,  REVIEW OF SYSTEMS:  CONSTITUTIONAL: No fever, fatigue or weakness.  EYES: No blurred or double vision.  EARS, NOSE, AND THROAT: No tinnitus or ear pain.  RESPIRATORY: No cough, shortness of breath, wheezing or hemoptysis.  CARDIOVASCULAR: No chest pain, orthopnea, edema.  GASTROINTESTINAL: No nausea, vomiting, diarrhea , some abdominal pain.  GENITOURINARY: No dysuria, positive for hematuria.  ENDOCRINE: No polyuria, nocturia,  HEMATOLOGY: No anemia, easy bruising or bleeding SKIN: No rash or lesion. MUSCULOSKELETAL: No joint pain or arthritis.   NEUROLOGIC: No tingling, numbness, weakness.  PSYCHIATRY: No anxiety or depression.   ROS  DRUG ALLERGIES:  No Known Allergies  VITALS:  Blood pressure 141/79, pulse 95, temperature 99.1 F (37.3 C), temperature source Oral, resp. rate 19, height 6\' 1"  (1.854 m), weight 95.754 kg (211 lb 1.6 oz), SpO2 100 %.  PHYSICAL EXAMINATION:  GENERAL:  60 y.o.-year-old patient lying in the bed with no acute distress.  EYES: Pupils equal, round, reactive to light and accommodation. No scleral icterus. Extraocular muscles intact.  HEENT: Head atraumatic, normocephalic. Oropharynx and nasopharynx clear.  NECK:  Supple, no jugular venous distention. No thyroid enlargement,  no tenderness.  LUNGS: Normal breath sounds bilaterally, no wheezing, rales,rhonchi or crepitation. No use of accessory muscles of respiration.  CARDIOVASCULAR: S1, S2 normal. No murmurs, rubs, or gallops.  ABDOMEN: Soft, nontender, nondistended. Bowel sounds present. No organomegaly or mass. Dressing and drainage tube in place. Foley catheter with red urine. EXTREMITIES: No pedal edema, cyanosis, or clubbing.  NEUROLOGIC: Cranial nerves II through XII are intact. Muscle strength 5/5 in all extremities. Sensation intact. Gait not checked.  PSYCHIATRIC: The patient is alert and oriented x 3.  SKIN: No obvious rash, lesion, or ulcer.   Physical Exam LABORATORY PANEL:   CBC  Recent Labs Lab 12/17/15 0415  WBC 17.0*  HGB 7.8*  HCT 23.3*  PLT 169   ------------------------------------------------------------------------------------------------------------------  Chemistries   Recent Labs Lab 12/15/15 0438  12/17/15 0415  NA 138  < > 137  K 4.6  < > 3.6  CL 109  < > 104  CO2 26  < > 29  GLUCOSE 129*  < > 114*  BUN 16  < > 11  CREATININE 1.19  < > 0.94  CALCIUM 7.5*  < > 7.9*  MG 2.5*  < > 1.7  AST 24  --   --   ALT 10*  --   --   ALKPHOS 45  --   --   BILITOT 4.0*  --   --   < > = values in this interval not displayed. ------------------------------------------------------------------------------------------------------------------  Cardiac Enzymes  Recent Labs Lab 12/14/15 0300  TROPONINI 0.07*   ------------------------------------------------------------------------------------------------------------------  RADIOLOGY:  Dg Chest 1 View  12/16/2015  CLINICAL DATA:  Dyspnea.  Postop  from repair of perforated ulcer. EXAM: CHEST 1 VIEW COMPARISON:  12/15/2015 FINDINGS: Endotracheal tube is been removed. Right jugular central venous catheter and nasogastric tube remain in place. New airspace disease is seen in the left perihilar region and central left upper lobe,  suspicious for pneumonia. Heart size is stable. Probable tiny right pleural effusion noted. No evidence of pneumothorax. IMPRESSION: New airspace disease in left perihilar region and central left upper lobe, suspicious for pneumonia. Electronically Signed   By: Earle Gell M.D.   On: 12/16/2015 08:08    ASSESSMENT AND PLAN:   Active Problems:   Perforated bowel ([place])  * Perforated viscus and sepsis and Hemorrhagic shock on admission   S/p surgery   On IV abx, bl cx negative.    Management per surgical team.    * hematuria and bladder mass   Urology consult  * Anemia due to blood loss - from Bladder and intraabdominal   Replaced, cont monitor.  * hx of PE   S/p IVC , no anticoagulation due to bleeding.  * Ac renal failure on admission   Improved now.  All the records are reviewed and case discussed with Care Management/Social Workerr. Management plans discussed with the patient, family and they are in agreement.  CODE STATUS: full  TOTAL TIME TAKING CARE OF THIS PATIENT: 35 minutes.     Vaughan Basta M.D on 12/17/2015   Between 7am to 6pm - Pager - [phone]  After 6pm go to [email or web address] - password [email or web address]  Office  [phone]  CC: Primary care physician; No PCP Per Patient  Note: This dictation was prepared with Dragon dictation along with smaller phrase technology. Any transcriptional errors that result from this process are unintentional.

## 2015-12-17 NOTE — Consult Note (Signed)
Urology Consult  I have been asked to see the patient by Dr. Burt Knack, for evaluation and management of gross hematuria  Chief Complaint: hematuria, bladder mass  History of Present Illness: Frederick Perry is a 60 y.o. year old with multiple medical problems including recent  N-STEMI, DVT with PE, acute on chronic anemia (Hbg 4.7 this admission, previously 4.9 upon admission in 08/215), recent diagnosis of bladder mass on CT scan who presented to the emergency room on 12/14/2015 with severe abdominal pain. CT scan showed free air in the abdomen consistent with a perforated viscus. He was taken to the operating room for exploratory laparoscopy and remained intubated in the ICU overnight.  He is now recovering, remains nothing by mouth with NG tube and has been transferred to the floor.  Urology called today due to ongoing hematuria and Foley catheter obstruction.  Frederick Perry had been previously seen and evaluated by Baruch Gouty on 09/06/2015 for a large bladder mass and was scheduled for outpatient follow-up to discuss TURBT but never showed up for this appointment. Attempts are made to contact this patient on innumerable occasions but we were unsuccessful.  Today, the patient reports that he's been having intermittently the severe gross hematuria since his discharge on which she was discharged with Lovenox for his PE. He reports the blood comes and goes. During this admission, he is unsure how long the hematuria has been present since he was intubated in the ICU but at least since yesterday. Today, his catheter has stopped flowing and become increasingly uncomfortable.  This was exchanged by the nursing staff for a 22 [place] two-way Foley catheter upon my arrival.  I was able to hand irrigate a good amount of dark clot using 1 L of normal saline until the urine was eventually a rose color.    Past Medical History  Diagnosis Date  . Diabetes mellitus without complication ([place])     Not on  medications  . Hypertension     Not on medications  . Polysubstance abuse     a. ongoing tobacco and alcohol abuse     Past Surgical History  Procedure Laterality Date  . Back surgery    . Patellar tendon repair    . Testicular torsion repair    . Cardiac catheterization N/A 09/05/2015    Procedure: Left Heart Cath and Coronary Angiography;  Surgeon: Minna Merritts, MD;  Location: [facility];  Service: Cardiovascular;  Laterality: N/A;  . Peripheral vascular catheterization N/A 09/07/2015    Procedure: IVC Filter Insertion;  Surgeon: Algernon Huxley, MD;  Location: [facility];  Service: Cardiovascular;  Laterality: N/A;  . Laparotomy N/A 12/14/2015    Procedure: EXPLORATORY LAPAROTOMY, prepyloric gastric ulcer biopsy;  Surgeon: Jules Husbands, MD;  Location: [facility];  Service: General;  Laterality: N/A;    Home Medications:    Medication List    ASK your doctor about these medications        atorvastatin 40 MG tablet  Commonly known as:  LIPITOR  Take 1 tablet (40 mg total) by mouth daily at 6 PM.     carvedilol 3.125 MG tablet  Commonly known as:  COREG  Take 1 tablet (3.125 mg total) by mouth 2 (two) times daily with a meal.     enoxaparin 150 MG/ML injection  Commonly known as:  LOVENOX  Inject 1 mL (150 mg total) into the skin daily.  Allergies: No Known Allergies  Family History  Problem Relation Age of Onset  . Congestive Heart Failure Mother   . Peripheral vascular disease Father     Social History:  reports that he has been smoking Cigarettes.  He has been smoking about 1.00 pack per day. He does not have any smokeless tobacco history on file. He reports that he drinks about 12.6 oz of alcohol per week. He reports that he does not use illicit drugs.  ROS: A complete review of systems was performed.  All systems are negative except for pertinent findings as noted.  Physical Exam:  Vital signs in last 24 hours: Temp:  [99.1 F (37.3  C)-101.3 F (38.5 C)] 99.1 F (37.3 C) (07/03 1344) Pulse Rate:  [94-96] 95 (07/03 1344) Resp:  [17-19] 19 (07/03 1344) BP: (128-166)/(72-94) 141/79 mmHg (07/03 1344) SpO2:  [95 %-100 %] 100 % (07/03 1344) Constitutional:  Alert and oriented, No acute distress HEENT: [place] AT, moist mucus membranes.  Trachea midline, no masses Cardiovascular: Regular rate and rhythm, no clubbing, cyanosis, or edema. Respiratory: Normal respiratory effort, lungs clear bilaterally GI: Abdomen is soft, appropriately tender, nondistended, no abdominal masses.  Wounds with clean dressing in place.  NGT in place.  GU: Circumcised phallus with Foley catheter in place Skin: No rashes, bruises or suspicious lesions Lymph: No cervical or inguinal adenopathy Neurologic: Grossly intact, no focal deficits, moving all 4 extremities Psychiatric: Normal mood and affect  Laboratory Data:   Recent Labs  12/15/15 0438 12/16/15 0518 12/17/15 0415  WBC 19.8* 20.4* 17.0*  HGB 8.3* 8.1* 7.8*  HCT 24.3* 23.9* 23.3*    Recent Labs  12/15/15 0438 12/16/15 0518 12/17/15 0415  NA 138 137 137  K 4.6 4.0 3.6  CL 109 105 104  CO2 26 28 29   GLUCOSE 129* 117* 114*  BUN 16 14 11   CREATININE 1.19 0.96 0.94  CALCIUM 7.5* 7.9* 7.9*   No results for input(s): LABPT, INR in the last 72 hours. No results for input(s): LABURIN in the last 72 hours. Results for orders placed or performed during the hospital encounter of 12/14/15  Blood Culture (routine x 2)     Status: None (Preliminary result)   Collection Time: 12/14/15  5:00 AM  Result Value Ref Range Status   Specimen Description BLOOD RIGHT FA  Final   Special Requests   Final    BOTTLES DRAWN AEROBIC AND ANAEROBIC AER 8ML ANA 9ML   Culture NO GROWTH 3 DAYS  Final   Report Status PENDING  Incomplete  Blood Culture (routine x 2)     Status: None (Preliminary result)   Collection Time: 12/14/15  5:10 AM  Result Value Ref Range Status   Specimen Description BLOOD  RIGHT H  Final   Special Requests BOTTLES DRAWN AEROBIC AND ANAEROBIC 8ML  Final   Culture NO GROWTH 3 DAYS  Final   Report Status PENDING  Incomplete  Culture, expectorated sputum-assessment     Status: None   Collection Time: 12/15/15 12:17 PM  Result Value Ref Range Status   Specimen Description TRACHEAL ASPIRATE  Final   Special Requests NONE  Final   Sputum evaluation THIS SPECIMEN IS ACCEPTABLE FOR SPUTUM CULTURE  Final   Report Status 12/15/2015 FINAL  Final  Culture, respiratory (NON-Expectorated)     Status: None (Preliminary result)   Collection Time: 12/15/15 12:17 PM  Result Value Ref Range Status   Specimen Description TRACHEAL ASPIRATE  Final   Special Requests NONE  Reflexed from [id number]  Final   Gram Stain   Final    MODERATE WBC PRESENT,BOTH PMN AND MONONUCLEAR FEW SQUAMOUS EPITHELIAL CELLS PRESENT RARE BUDDING YEAST SEEN    Culture   Final    CULTURE REINCUBATED FOR BETTER GROWTH Performed at [facility]    Report Status PENDING  Incomplete  MRSA PCR Screening     Status: None   Collection Time: 12/15/15  5:45 PM  Result Value Ref Range Status   MRSA by PCR NEGATIVE NEGATIVE Final    Comment:        The GeneXpert MRSA Assay (FDA approved for NASAL specimens only), is one component of a comprehensive MRSA colonization surveillance program. It is not intended to diagnose MRSA infection nor to guide or monitor treatment for MRSA infections.      Radiologic Imaging:    Study Result     CLINICAL DATA: Diffuse abdominal pain for over week.  EXAM: CTA ABDOMEN AND PELVIS wITHOUT AND WITH CONTRAST  TECHNIQUE: Multidetector CT imaging of the abdomen and pelvis was performed using the standard protocol during bolus administration of intravenous contrast. Multiplanar reconstructed images and MIPs were obtained and reviewed to evaluate the vascular anatomy.  CONTRAST: 100 mL Isovue 370 intravenous  COMPARISON:  09/06/2015  FINDINGS: There is free intraperitoneal air. Source of the free air is not conclusive, but there is mural edema at the gastric antrum, pylorus and duodenal bulb. This may be the site of a hollow viscus perforation.  Small to moderate volume ascites.  There are unremarkable appearances of the liver, gallbladder, bile ducts, pancreas, spleen, adrenals and kidneys. Ureters are unremarkable.  The urinary bladder contains a large volume of irregular soft tissue which may represent blood. Alternatively, a urinary bladder neoplasm may be considered.  There is a hiatal hernia. There is uncomplicated colonic diverticulosis.  The abdominal aorta is normal in caliber with moderate atherosclerotic calcification.  There is an IVC filter.  There is no significant abnormality in the lower chest.  There is no significant skeletal lesion.  CT angiographic images demonstrate normal caliber of the abdominal aorta. The celiac, SMA and IMA are patent. Renal arteries are patent. Common iliac and external iliac arteries are patent. Review of the MIP and multiplanar reconstruction images confirms these findings.  IMPRESSION: 1. Free intraperitoneal air. Mural edema at the gastric antrum, pylorus and duodenal bulb suggest this may be the location of the hollow viscus perforation. These results were called by telephone at the time of interpretation on 12/14/2015 at 4:05 am to Dr. Hinda Kehr , who verbally acknowledged these results. 2. Large volume blood or neoplasm within the urinary bladder. 3. Hiatal hernia. 4. Diverticulosis. 5. Small volume ascites.   Electronically Signed  By: Andreas Newport M.D.  On: 12/14/2015 04:11     CT scan personally reviewed today   Assessment:/ Plan  60 year old male with large untreated bladder mass, likely malignancy and possible etiology of ongoing severe chronic anemia in addition to GI bleeding.    1. Bladder mass-  Likely malignant but has not undergone cysto/ TURBT given multiple other medical comorbidities and failure to follow-up.  I will notify Dr. Pilar Jarvis that the patient is in house and see if he'd like to arrange any intervention during this admission, otherwise stressed the importance of very close follow-up with the patient. Reiterated that untreated bladder cancer can ultimately metastasize and become unresectable/ incurable.    2. Gross hematuria- likely secondary to bladder mass and irritation from  Foley catheter. Catheter upsized to a 16 [place] two-way Foley catheter, hand irrigated to acceptable clarity today.  Continue hand irrigation as needed and recommend removing Foley as soon as possible as this is likely irritating his badder mass possibly exacerbating the bleeding.  3. Anemia- Likely secondary to #1 in addition to GI bleeding.  Not on any antiplatelet or anticoagulation at this time, IVC filter in place. Recommend continue holding these medications given propensity for anemia and bleeding.  4. AKI- resolved, Cr back to baseline   12/17/2015, 5:31 PM  Frederick Espy,  MD  Thank you for involving me in this patient's care, Urology will continue to follow. Please page with any further questions or concerns.

## 2015-12-17 NOTE — Progress Notes (Signed)
Red blood in foley, irrigated with NS. Unable to pull out full amount instilled. Spoke to urologist, he suggested to move foley up a few inches and irrigate. Will continue to monitor urine output.

## 2015-12-17 NOTE — Care Management (Signed)
Transferred out of icu to [facility] 7/2.  Patient ambulating without difficulty. Foley in place and draining bloody urine with some clots..  Urology consult pending.  It is reported patient has a bladder mass.  No physical therapy needs.

## 2015-12-18 DIAGNOSIS — R310 Gross hematuria: Secondary | ICD-10-CM

## 2015-12-18 LAB — BASIC METABOLIC PANEL
ANION GAP: 3 — AB (ref 5–15)
BUN: 9 mg/dL (ref 6–20)
CHLORIDE: 103 mmol/L (ref 101–111)
CO2: 30 mmol/L (ref 22–32)
Calcium: 8 mg/dL — ABNORMAL LOW (ref 8.9–10.3)
Creatinine, Ser: 0.88 mg/dL (ref 0.61–1.24)
Glucose, Bld: 121 mg/dL — ABNORMAL HIGH (ref 65–99)
POTASSIUM: 3.4 mmol/L — AB (ref 3.5–5.1)
SODIUM: 136 mmol/L (ref 135–145)

## 2015-12-18 LAB — CBC WITH DIFFERENTIAL/PLATELET
BASOS ABS: 0 10*3/uL (ref 0–0.1)
BASOS PCT: 0 %
BLASTS: 0 %
Band Neutrophils: 0 %
EOS ABS: 0.1 10*3/uL (ref 0–0.7)
EOS PCT: 1 %
HCT: 23.1 % — ABNORMAL LOW (ref 40.0–52.0)
HEMOGLOBIN: 7.7 g/dL — AB (ref 13.0–18.0)
LYMPHS PCT: 9 %
Lymphs Abs: 1.2 10*3/uL (ref 1.0–3.6)
MCH: 28.5 pg (ref 26.0–34.0)
MCHC: 33.2 g/dL (ref 32.0–36.0)
MCV: 85.6 fL (ref 80.0–100.0)
MONO ABS: 1.7 10*3/uL — AB (ref 0.2–1.0)
MONOS PCT: 12 %
MYELOCYTES: 0 %
Metamyelocytes Relative: 0 %
NEUTROS PCT: 78 %
Neutro Abs: 10.8 10*3/uL — ABNORMAL HIGH (ref 1.4–6.5)
Other: 0 %
PLATELETS: 158 10*3/uL (ref 150–440)
PROMYELOCYTES ABS: 0 %
RBC: 2.7 MIL/uL — ABNORMAL LOW (ref 4.40–5.90)
RDW: 18 % — ABNORMAL HIGH (ref 11.5–14.5)
SMEAR REVIEW: ADEQUATE
WBC: 13.8 10*3/uL — AB (ref 3.8–10.6)
nRBC: 1 /100 WBC — ABNORMAL HIGH

## 2015-12-18 LAB — GLUCOSE, CAPILLARY
Glucose-Capillary: 116 mg/dL — ABNORMAL HIGH (ref 65–99)
Glucose-Capillary: 139 mg/dL — ABNORMAL HIGH (ref 65–99)
Glucose-Capillary: 135 mg/dL — ABNORMAL HIGH (ref 65–99)
Glucose-Capillary: 145 mg/dL — ABNORMAL HIGH (ref 65–99)

## 2015-12-18 LAB — URINE CULTURE: CULTURE: NO GROWTH

## 2015-12-18 MED ORDER — LIDOCAINE HCL 2 % EX GEL
1.0000 "application " | Freq: Once | CUTANEOUS | Status: AC
  Administered 2015-12-18: 1 via URETHRAL
  Filled 2015-12-18: qty 5

## 2015-12-18 NOTE — Progress Notes (Addendum)
4 Days Post-Op Subjective: Patient reports no significant bladder pain.  Objective: Vital signs in last 24 hours: Temp:  [99.1 F (37.3 C)-102.4 F (39.1 C)] 99.4 F (37.4 C) (07/04 0435) Pulse Rate:  [94-99] 94 (07/04 0435) Resp:  [19] 19 (07/04 0435) BP: (138-141)/(79-84) 138/84 mmHg (07/04 0435) SpO2:  [94 %-100 %] 100 % (07/04 0435)  Intake/Output from previous day: 07/03 0701 - 07/04 0700 In: 3719.1 [P.O.:210; I.V.:3175.1; NG/GT:90; IV Piggyback:244] Out: [id number] [Urine:1150; Emesis/NG output:150; Drains:40] Intake/Output this shift: Total I/O In: 561 [I.V.:525; IV Piggyback:36] Out: 105 [Urine:75; Drains:30]  Physical Exam:  Constitutional: Vital signs reviewed. WD WN in NAD   Eyes: PERRL, No scleral icterus.   Cardiovascular: RRR Pulmonary/Chest: Normal effort Extremities: No cyanosis or edema   Lab Results:  Recent Labs  12/16/15 0518 12/17/15 0415 12/18/15 0320  HGB 8.1* 7.8* 7.7*  HCT 23.9* 23.3* 23.1*   BMET  Recent Labs  12/17/15 0415 12/18/15 0320  NA 137 136  K 3.6 3.4*  CL 104 103  CO2 29 30  GLUCOSE 114* 121*  BUN 11 9  CREATININE 0.94 0.88  CALCIUM 7.9* 8.0*   No results for input(s): LABPT, INR in the last 72 hours. No results for input(s): LABURIN in the last 72 hours. Results for orders placed or performed during the hospital encounter of 12/14/15  Blood Culture (routine x 2)     Status: None (Preliminary result)   Collection Time: 12/14/15  5:00 AM  Result Value Ref Range Status   Specimen Description BLOOD RIGHT FA  Final   Special Requests   Final    BOTTLES DRAWN AEROBIC AND ANAEROBIC AER 8ML ANA 9ML   Culture NO GROWTH 4 DAYS  Final   Report Status PENDING  Incomplete  Blood Culture (routine x 2)     Status: None (Preliminary result)   Collection Time: 12/14/15  5:10 AM  Result Value Ref Range Status   Specimen Description BLOOD RIGHT H  Final   Special Requests BOTTLES DRAWN AEROBIC AND ANAEROBIC 8ML  Final   Culture NO  GROWTH 4 DAYS  Final   Report Status PENDING  Incomplete  Culture, expectorated sputum-assessment     Status: None   Collection Time: 12/15/15 12:17 PM  Result Value Ref Range Status   Specimen Description TRACHEAL ASPIRATE  Final   Special Requests NONE  Final   Sputum evaluation THIS SPECIMEN IS ACCEPTABLE FOR SPUTUM CULTURE  Final   Report Status 12/15/2015 FINAL  Final  Culture, respiratory (NON-Expectorated)     Status: None (Preliminary result)   Collection Time: 12/15/15 12:17 PM  Result Value Ref Range Status   Specimen Description TRACHEAL ASPIRATE  Final   Special Requests NONE Reflexed from [id number]  Final   Gram Stain   Final    MODERATE WBC PRESENT,BOTH PMN AND MONONUCLEAR FEW SQUAMOUS EPITHELIAL CELLS PRESENT RARE BUDDING YEAST SEEN    Culture   Final    RARE YEAST IDENTIFICATION TO FOLLOW Performed at [facility]    Report Status PENDING  Incomplete  MRSA PCR Screening     Status: None   Collection Time: 12/15/15  5:45 PM  Result Value Ref Range Status   MRSA by PCR NEGATIVE NEGATIVE Final    Comment:        The GeneXpert MRSA Assay (FDA approved for NASAL specimens only), is one component of a comprehensive MRSA colonization surveillance program. It is not intended to diagnose MRSA infection nor to guide or monitor  treatment for MRSA infections.   Urine culture     Status: None   Collection Time: 12/16/15 11:00 PM  Result Value Ref Range Status   Specimen Description URINE, CATHETERIZED  Final   Special Requests NONE  Final   Culture NO GROWTH Performed at [facility]   Final   Report Status 12/18/2015 FINAL  Final    Studies/Results: No results found.   I tried irrigating his smaller catheter-it was quite difficult. After sterile prep and drape, I then placed a 24 French silicone catheter. 2% viscous lidocaine was used for anesthesia/lubrication. I irrigated perhaps 100 mL of clot, utilizing 1200 mL of saline. The patient tolerated  this well. The catheter was then hooked to dependent drainage. Assessment/Plan:   Bladder mass with gross hematuria, now draining adequately. This will need to be evaluated with anesthetic cystoscopy and probable TURBT, most likely during this hospitalization. We will continue to follow.   LOS: 4 days   Franchot Gallo M 12/18/2015, 12:25 PM   30 minutes were spent face-to-face with the patient.

## 2015-12-18 NOTE — Progress Notes (Signed)
Pt continues to have frank red blood from foley. No significant change in hgb. No other issues.

## 2015-12-18 NOTE — Progress Notes (Signed)
Foley irrigated with NS. Pulled back some clots. Foley draining frank red blood

## 2015-12-18 NOTE — Progress Notes (Signed)
[place] at [place] NAME: Frederick Perry    MR#:  [id number]  DATE OF BIRTH:  12-Oct-1955  SUBJECTIVE:  CHIEF COMPLAINT:   Chief Complaint  Patient presents with  . 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 Was advised to follow-up with urology for further diagnosis of bladder mass, but he did not follow-up.  Came with complaint of abdominal pain and found to have perforated gastric ulcer with blood collection in peritoneum.   S/p surgery- on IV abx, Extubated, have fever.   Hematuria, and Intraabdominal blood loss- anemia.   No new complains, started on clear liquids, and he is ambulating fine.  REVIEW OF SYSTEMS:  CONSTITUTIONAL: No fever, fatigue or weakness.  EYES: No blurred or double vision.  EARS, NOSE, AND THROAT: No tinnitus or ear pain.  RESPIRATORY: No cough, shortness of breath, wheezing or hemoptysis.  CARDIOVASCULAR: No chest pain, orthopnea, edema.  GASTROINTESTINAL: No nausea, vomiting, diarrhea , some abdominal pain.  GENITOURINARY: No dysuria, positive for hematuria.  ENDOCRINE: No polyuria, nocturia,  HEMATOLOGY: No anemia, easy bruising or bleeding SKIN: No rash or lesion. MUSCULOSKELETAL: No joint pain or arthritis.   NEUROLOGIC: No tingling, numbness, weakness.  PSYCHIATRY: No anxiety or depression.   ROS  DRUG ALLERGIES:  No Known Allergies  VITALS:  Blood pressure 156/95, pulse 91, temperature 97.5 F (36.4 C), temperature source Oral, resp. rate 18, height 6\' 1"  (1.854 m), weight 95.754 kg (211 lb 1.6 oz), SpO2 95 %.  PHYSICAL EXAMINATION:  GENERAL:  60 y.o.-year-old patient lying in the bed with no acute distress.  EYES: Pupils equal, round, reactive to light and accommodation. No scleral icterus. Extraocular muscles intact.  HEENT: Head atraumatic, normocephalic. Oropharynx and nasopharynx clear.  NECK:  Supple, no  jugular venous distention. No thyroid enlargement, no tenderness.  LUNGS: Normal breath sounds bilaterally, no wheezing, rales,rhonchi or crepitation. No use of accessory muscles of respiration.  CARDIOVASCULAR: S1, S2 normal. No murmurs, rubs, or gallops.  ABDOMEN: Soft, nontender, nondistended. Bowel sounds present. No organomegaly or mass. Dressing and drainage tube in place. Foley catheter with red urine. EXTREMITIES: No pedal edema, cyanosis, or clubbing.  NEUROLOGIC: Cranial nerves II through XII are intact. Muscle strength 5/5 in all extremities. Sensation intact. Gait not checked.  PSYCHIATRIC: The patient is alert and oriented x 3.  SKIN: No obvious rash, lesion, or ulcer.   Physical Exam LABORATORY PANEL:   CBC  Recent Labs Lab 12/18/15 0320  WBC 13.8*  HGB 7.7*  HCT 23.1*  PLT 158   ------------------------------------------------------------------------------------------------------------------  Chemistries   Recent Labs Lab 12/15/15 0438  12/17/15 0415 12/18/15 0320  NA 138  < > 137 136  K 4.6  < > 3.6 3.4*  CL 109  < > 104 103  CO2 26  < > 29 30  GLUCOSE 129*  < > 114* 121*  BUN 16  < > 11 9  CREATININE 1.19  < > 0.94 0.88  CALCIUM 7.5*  < > 7.9* 8.0*  MG 2.5*  < > 1.7  --   AST 24  --   --   --   ALT 10*  --   --   --   ALKPHOS 45  --   --   --   BILITOT 4.0*  --   --   --   < > = values in this interval  not displayed. ------------------------------------------------------------------------------------------------------------------  Cardiac Enzymes  Recent Labs Lab 12/14/15 0300  TROPONINI 0.07*   ------------------------------------------------------------------------------------------------------------------  RADIOLOGY:  No results found.  ASSESSMENT AND PLAN:   Active Problems:   Perforated bowel ([place])  * Perforated viscus and sepsis and Hemorrhagic shock on admission   S/p surgery   On IV abx, bl cx negative.    Management per  surgical team.    * hematuria and bladder mass   Urology consult appreciated.   Possibly cyctoscopy and TURBP.  * Anemia due to blood loss - from Bladder and intraabdominal   Replaced, cont monitor.  * hx of PE   S/p IVC , no anticoagulation due to bleeding.  * Ac renal failure on admission   Improved now.  All the records are reviewed and case discussed with Care Management/Social Workerr. Management plans discussed with the patient, family and they are in agreement.  CODE STATUS: full  TOTAL TIME TAKING CARE OF THIS PATIENT: 35 minutes.     Vaughan Basta M.D on 12/18/2015   Between 7am to 6pm - Pager - [phone]  After 6pm go to [email or web address] - password [email or web address] [facility]  Office  [phone]  CC: Primary care physician; No PCP Per Patient  Note: This dictation was prepared with Dragon dictation along with smaller phrase technology. Any transcriptional errors that result from this process are unintentional.

## 2015-12-18 NOTE — Progress Notes (Signed)
Per Dr. Diona Fanti place order for 2% lidocaine jelly

## 2015-12-18 NOTE — Progress Notes (Signed)
4 Days Post-Op  Subjective: Appreciate Dr. Cherrie Gauze input concerning probable bladder mass. Will plan on dis continuing the Foley catheter tomorrow morning. Patient feels better today with the nasogastric tube having been removed she's is tolerating a clear liquid diet. Minimal abdominal pain did have a fever last night however but feels well now  Objective: Vital signs in last 24 hours: Temp:  [99.1 F (37.3 C)-102.4 F (39.1 C)] 99.4 F (37.4 C) (07/04 0435) Pulse Rate:  [94-99] 94 (07/04 0435) Resp:  [19] 19 (07/04 0435) BP: (138-141)/(79-84) 138/84 mmHg (07/04 0435) SpO2:  [94 %-100 %] 100 % (07/04 0435) Last BM Date:  (PTA)  Intake/Output from previous day: 07/03 0701 - 07/04 0700 In: 3719.1 [P.O.:210; I.V.:3175.1; NG/GT:90; IV Piggyback:244] Out: [id number] [Urine:1150; Emesis/NG output:150; Drains:40] Intake/Output this shift:    Physical exam:  Vital signs reviewed and stable with the exception of his fever and slight tachycardia. Abdomen is soft and minimally tender without peritoneal signs wound is clean drain shows minimal drainage. Calves are nontender Foley catheter in place with frank hematuria  Lab Results: CBC   Recent Labs  12/17/15 0415 12/18/15 0320  WBC 17.0* 13.8*  HGB 7.8* 7.7*  HCT 23.3* 23.1*  PLT 169 158   BMET  Recent Labs  12/17/15 0415 12/18/15 0320  NA 137 136  K 3.6 3.4*  CL 104 103  CO2 29 30  GLUCOSE 114* 121*  BUN 11 9  CREATININE 0.94 0.88  CALCIUM 7.9* 8.0*   PT/INR No results for input(s): LABPROT, INR in the last 72 hours. ABG No results for input(s): PHART, HCO3 in the last 72 hours.  Invalid input(s): PCO2, PO2  Studies/Results: No results found.  Anti-infectives: Anti-infectives    Start     Dose/Rate Route Frequency Ordered Stop   12/14/15 1100  piperacillin-tazobactam (ZOSYN) IVPB 3.375 g     3.375 g 12.5 mL/hr over 240 Minutes Intravenous Every 8 hours 12/14/15 0849     12/14/15 0400  piperacillin-tazobactam  (ZOSYN) IVPB 3.375 g     3.375 g 100 mL/hr over 30 Minutes Intravenous  Once 12/14/15 0359 12/14/15 0547      Assessment/Plan: s/p Procedure(s): EXPLORATORY LAPAROTOMY, prepyloric gastric ulcer biopsy   H&H is stable. Appreciate Dr. Cherrie Gauze input and will discontinue the Foley catheter tomorrow morning at urology suggestion. Currently he is doing well but I would continue him on clear liquids only for now we'll get a chest x-ray to look for alternative sources of his fever although this could be urinary or intra-abdominal.  [name], MD, FACS  12/18/2015

## 2015-12-19 ENCOUNTER — Inpatient Hospital Stay: Payer: Medicaid Other

## 2015-12-19 DIAGNOSIS — R310 Gross hematuria: Secondary | ICD-10-CM

## 2015-12-19 LAB — BASIC METABOLIC PANEL
ANION GAP: 4 — AB (ref 5–15)
BUN: 9 mg/dL (ref 6–20)
CALCIUM: 7.8 mg/dL — AB (ref 8.9–10.3)
CO2: 28 mmol/L (ref 22–32)
Chloride: 102 mmol/L (ref 101–111)
Creatinine, Ser: 0.82 mg/dL (ref 0.61–1.24)
GFR calc Af Amer: >60 mL/min (ref >60)
Glucose, Bld: 126 mg/dL — ABNORMAL HIGH (ref 65–99)
POTASSIUM: 3.2 mmol/L — AB (ref 3.5–5.1)
SODIUM: 134 mmol/L — AB (ref 135–145)

## 2015-12-19 LAB — CULTURE, BLOOD (ROUTINE X 2)
CULTURE: NO GROWTH
Culture: NO GROWTH

## 2015-12-19 LAB — CULTURE, RESPIRATORY W GRAM STAIN

## 2015-12-19 LAB — SURGICAL PCR SCREEN
MRSA, PCR: NEGATIVE
STAPHYLOCOCCUS AUREUS: NEGATIVE

## 2015-12-19 LAB — CBC WITH DIFFERENTIAL/PLATELET
BAND NEUTROPHILS: 0 %
BASOS ABS: 0 10*3/uL (ref 0–0.1)
Basophils Relative: 0 %
Blasts: 0 %
EOS ABS: 0.4 10*3/uL (ref 0–0.7)
EOS PCT: 3 %
HCT: 22.1 % — ABNORMAL LOW (ref 40.0–52.0)
Hemoglobin: 7.4 g/dL — ABNORMAL LOW (ref 13.0–18.0)
LYMPHS ABS: 1.3 10*3/uL (ref 1.0–3.6)
Lymphocytes Relative: 11 %
MCH: 28.7 pg (ref 26.0–34.0)
MCHC: 33.7 g/dL (ref 32.0–36.0)
MCV: 85.3 fL (ref 80.0–100.0)
METAMYELOCYTES PCT: 0 %
MONO ABS: 1.7 10*3/uL — AB (ref 0.2–1.0)
MYELOCYTES: 0 %
Monocytes Relative: 14 %
Neutro Abs: 8.4 10*3/uL — ABNORMAL HIGH (ref 1.4–6.5)
Neutrophils Relative %: 72 %
Other: 0 %
PLATELETS: 164 10*3/uL (ref 150–440)
Promyelocytes Absolute: 0 %
RBC: 2.59 MIL/uL — ABNORMAL LOW (ref 4.40–5.90)
RDW: 17.9 % — ABNORMAL HIGH (ref 11.5–14.5)
WBC: 11.8 10*3/uL — AB (ref 3.8–10.6)
nRBC: 0 /100 WBC

## 2015-12-19 LAB — GLUCOSE, CAPILLARY
Glucose-Capillary: 122 mg/dL — ABNORMAL HIGH (ref 65–99)
Glucose-Capillary: 134 mg/dL — ABNORMAL HIGH (ref 65–99)
Glucose-Capillary: 107 mg/dL — ABNORMAL HIGH (ref 65–99)
Glucose-Capillary: 114 mg/dL — ABNORMAL HIGH (ref 65–99)

## 2015-12-19 LAB — MAGNESIUM: Magnesium: 1.5 mg/dL — ABNORMAL LOW (ref 1.7–2.4)

## 2015-12-19 LAB — PREPARE RBC (CROSSMATCH)

## 2015-12-19 LAB — CULTURE, RESPIRATORY

## 2015-12-19 MED ORDER — SODIUM CHLORIDE 0.9 % IV SOLN
Freq: Once | INTRAVENOUS | Status: AC
  Administered 2015-12-19: 21:00:00 via INTRAVENOUS

## 2015-12-19 MED ORDER — POTASSIUM CHLORIDE 20 MEQ PO PACK
40.0000 meq | PACK | Freq: Two times a day (BID) | ORAL | Status: DC
  Administered 2015-12-19 – 2015-12-22 (×6): 40 meq via ORAL
  Filled 2015-12-19 (×6): qty 2

## 2015-12-19 NOTE — Progress Notes (Signed)
[place] [facility] [place] NAME: Frederick Perry    MR#:  [id number]  DATE OF BIRTH:  01-18-1956  SUBJECTIVE:  CHIEF COMPLAINT:   Chief Complaint  Patient presents with  . 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 Was advised to follow-up with urology for further diagnosis of bladder mass, but he did not follow-up.  Came with complaint of abdominal pain and found to have perforated gastric ulcer with blood collection in peritoneum.   S/p surgery- on IV abx, Extubated, have fever.   Hematuria, and Intraabdominal blood loss- anemia.   No new complains, started on clear liquids, and he is ambulating fine.    Urology planning for TURBT tomorrow,  Blood transfusion is ordered today.  REVIEW OF SYSTEMS:  CONSTITUTIONAL: No fever, fatigue or weakness.  EYES: No blurred or double vision.  EARS, NOSE, AND THROAT: No tinnitus or ear pain.  RESPIRATORY: No cough, shortness of breath, wheezing or hemoptysis.  CARDIOVASCULAR: No chest pain, orthopnea, edema.  GASTROINTESTINAL: No nausea, vomiting, diarrhea , some abdominal pain.  GENITOURINARY: No dysuria, positive for hematuria.  ENDOCRINE: No polyuria, nocturia,  HEMATOLOGY: No anemia, easy bruising or bleeding SKIN: No rash or lesion. MUSCULOSKELETAL: No joint pain or arthritis.   NEUROLOGIC: No tingling, numbness, weakness.  PSYCHIATRY: No anxiety or depression.   ROS  DRUG ALLERGIES:  No Known Allergies  VITALS:  Blood pressure 141/81, pulse 87, temperature 99.1 F (37.3 C), temperature source Oral, resp. rate 20, height 6\' 1"  (1.854 m), weight 95.754 kg (211 lb 1.6 oz), SpO2 100 %.  PHYSICAL EXAMINATION:  GENERAL:  60 y.o.-year-old patient lying in the bed with no acute distress.  EYES: Pupils equal, round, reactive to light and accommodation. No scleral icterus. Extraocular muscles intact.  HEENT: Head  atraumatic, normocephalic. Oropharynx and nasopharynx clear.  NECK:  Supple, no jugular venous distention. No thyroid enlargement, no tenderness.  LUNGS: Normal breath sounds bilaterally, no wheezing, rales,rhonchi or crepitation. No use of accessory muscles of respiration.  CARDIOVASCULAR: S1, S2 normal. No murmurs, rubs, or gallops.  ABDOMEN: Soft, nontender, nondistended. Bowel sounds present. No organomegaly or mass. Dressing and drainage tube in place. Foley catheter with red urine. EXTREMITIES: No pedal edema, cyanosis, or clubbing.  NEUROLOGIC: Cranial nerves II through XII are intact. Muscle strength 5/5 in all extremities. Sensation intact. Gait not checked.  PSYCHIATRIC: The patient is alert and oriented x 3.  SKIN: No obvious rash, lesion, or ulcer.   Physical Exam LABORATORY PANEL:   CBC  Recent Labs Lab 12/19/15 0210  WBC 11.8*  HGB 7.4*  HCT 22.1*  PLT 164   ------------------------------------------------------------------------------------------------------------------  Chemistries   Recent Labs Lab 12/15/15 0438  12/19/15 0210  NA 138  < > 134*  K 4.6  < > 3.2*  CL 109  < > 102  CO2 26  < > 28  GLUCOSE 129*  < > 126*  BUN 16  < > 9  CREATININE 1.19  < > 0.82  CALCIUM 7.5*  < > 7.8*  MG 2.5*  < > 1.5*  AST 24  --   --   ALT 10*  --   --   ALKPHOS 45  --   --   BILITOT 4.0*  --   --   < > = values in this interval not displayed. ------------------------------------------------------------------------------------------------------------------  Cardiac Enzymes  Recent Labs Lab 12/14/15  0300  TROPONINI 0.07*   ------------------------------------------------------------------------------------------------------------------  RADIOLOGY:  Dg Chest 2 View  12/19/2015  CLINICAL DATA:  Fever.  Bladder mass. EXAM: CHEST  2 VIEW COMPARISON:  12/16/2015. FINDINGS: Interim removal of NG tube. Right IJ line in stable position. Mediastinum hilar structures  normal. Low lung volumes with bibasilar atelectasis and/or infiltrates. One near complete clearing of left perihilar infiltrate. Small bilateral pleural effusions. No pneumothorax. IMPRESSION: 1.  Interim removal of NG tube.  Right IJ line stable position. 2 Low lung volumes with mild bibasilar atelectasis and/or infiltrates. Interim near complete clearing of left perihilar/left upper lobe infiltrate. Small bilateral pleural effusions. 3.  Cardiomegaly. Electronically Signed   By: Frederick Perry  Register   On: 12/19/2015 08:22    ASSESSMENT AND PLAN:   Active Problems:   Perforated bowel ([place])  * Perforated viscus and sepsis and Hemorrhagic shock on admission   S/p surgery   On IV abx, bl cx negative.    Management per surgical team.    * hematuria and bladder mass   Urology consult appreciated.  planned on 12/20/15 cyctoscopy and TURBP.  * Anemia due to blood loss - from Bladder and intraabdominal   Replaced, cont monitor.   As going for procedure tomorrow, with chances of bleed- give 2 units PRBC today.  * hx of PE   S/p IVC , no anticoagulation due to bleeding.  * Ac renal failure on admission   Improved now.  All the records are reviewed and case discussed with Care Management/Social Workerr. Management plans discussed with the patient, family and they are in agreement.  CODE STATUS: full  TOTAL TIME TAKING CARE OF THIS PATIENT: 35 minutes.    Vaughan Basta M.D on 12/19/2015   Between 7am to 6pm - Pager - [phone]  After 6pm go to [email or web address] - password [email or web address] [facility]  Office  [phone]  CC: Primary care physician; No PCP Per Patient  Note: This dictation was prepared with Dragon dictation along with smaller phrase technology. Any transcriptional errors that result from this process are unintentional.

## 2015-12-19 NOTE — Progress Notes (Signed)
Reviewed all the urology notes. And I discussed patient with the RN. Currently he is clotting his catheter periodically Dr. Erlene Quan had suggested removing the catheter because she thought it may be causing enough irritation of the tumor that it was causing the bleeding. I suggested that the RN not remove the catheter at this point as I had previously ordered and call the urologist of record and discuss cystoscopy at some point neck and be done at any time from our general surgical perspective.

## 2015-12-19 NOTE — Progress Notes (Signed)
Outpatient postop visit  12/19/2015  Frederick Perry is an 60 y.o. male.    Procedure: Exploratory lap for perforated ulcer  CC feels better  HPI: This patient is field making better and having no problems at this time he is tolerating a clear liquid diet and has had no further fevers. Appreciate GU input  Medications reviewed.    Physical Exam:  BP 156/83 mmHg  Pulse 92  Temp(Src) 98.2 F (36.8 C) (Oral)  Resp 19  Ht 6\' 1"  (1.854 m)  Wt 211 lb 1.6 oz (95.754 kg)  BMI 27.86 kg/m2  SpO2 100%    PE: Pilar Plate hematuria abdomen soft nontender wounds clean serous fluid in drain    Assessment/Plan:  Will advance diet and saline lock today we'll discontinue Foley catheter as suggested by Dr. Erlene Quan. Patient can have cystoscopy etc. at any point prior to discharge.  [name], MD, FACS

## 2015-12-19 NOTE — Progress Notes (Signed)
Pharmacy Antibiotic Note  Frederick Perry is a 60 y.o. male admitted on 12/14/2015 with perforated bowel s/p laparotomy.  Pharmacy consulted for Zosyn dosing.  Plan: Will give Zosyn 3.375 g IV q8 hours.   Height: 6\' 1"  (185.4 cm) Weight: 211 lb 1.6 oz (95.754 kg) IBW/kg (Calculated) : 79.9  Temp (24hrs), Avg:98.5 F (36.9 C), Min:97.5 F (36.4 C), Max:99.8 F (37.7 C)   Recent Labs Lab 12/14/15 0312  12/15/15 0438 12/16/15 0518 12/17/15 0415 12/18/15 0320 12/19/15 0210  WBC  --   < > 19.8* 20.4* 17.0* 13.8* 11.8*  CREATININE  --   < > 1.19 0.96 0.94 0.88 0.82  LATICACIDVEN 9.5*  --   --   --   --   --   --   < > = values in this interval not displayed.  Estimated Creatinine Clearance: 111 mL/min (by C-G formula based on Cr of 0.82).    No Known Allergies  Antimicrobials this admission: Zosyn 6/30 >>   Dose adjustments this admission:  Microbiology results: 6/30 BCx: ng x 2d 6/30 UCx: pending  7/1- trach aspirate cx: 7/1 MRSA PCR neg.   Thank you for allowing pharmacy to be a part of this patient's care.  Quamesha Mullet D 12/19/2015 7:37 AM

## 2015-12-19 NOTE — Care Management (Signed)
Patient admitted status post Exploratory lap for perforated ulcer.  Patient states that he lives at home with his mother.  Does not have any source of income and is uninsured.  Patient states that he does have transportation when needed.  Patient was provided with application to [facility] in March 2017, when I asked the patient if he had completed the packets patient states "I have just been to busy to get around to it, I have to help take care of my mother".  RNCM following for discharge planning

## 2015-12-19 NOTE — Progress Notes (Addendum)
Patient with continued gross hematuria and clots. Requiring hand irrigation on occasion.  Physical exam: Pt in NAD.  20 French Foley catheters in place. Urine is grossly bloody.  Procedure:  I irrigated the catheter and was able to irrigate several clots. The drainage then turned pink. The catheter is not easy to irrigate with some of that seems to be bladder spasms as the irrigation will drain out by gravity.   H/H 7.4/22; bun 9, cr 0.82   Assessment/plan-continued gross hematuria with clots likely from bladder mass - agree with Gen Surg patient recovered sufficiently from exploratory laparoscopy and perforation of gastric ulcer as he is advancing to regular diet this evening. He's tolerated full liquid diet today. On my estimation the urine is too bloody to DC the Foley or discharge the patient home and he will need a cysto/TURBT during this hospitalization. I went over with the patient the nature, risks benefits and alternatives to TURBT. Will try and have this arranged for tomorrow or Friday. Would be preferable to have hemoglobin over 8 prior to procedure as bleeding is a risk during TURBT. I discussed bleeding risk with the patient as well as infection and bladder perforation among others. We discussed occasional long-term need for catheter. We also discussed it's difficult to tell on the CT what exactly is blood clot in the bladder what his bladder mass and that he may need a staged procedure.  Addendum: Patient on for cystoscopy, TURBT tomorrow with Dr. Pilar Jarvis at 12:30 PM. NPO and consent orders placed. Anemia and IVF after MN per primary team.

## 2015-12-20 ENCOUNTER — Encounter
Admission: EM | Disposition: A | Discharge status: Home / Self Care | Payer: Self-pay | Source: Home / Self Care | Attending: Surgery

## 2015-12-20 ENCOUNTER — Inpatient Hospital Stay: Payer: Medicaid Other | Admitting: Anesthesiology

## 2015-12-20 ENCOUNTER — Encounter: Payer: Self-pay | Admitting: Anesthesiology

## 2015-12-20 DIAGNOSIS — D494 Neoplasm of unspecified behavior of bladder: Secondary | ICD-10-CM

## 2015-12-20 HISTORY — PX: DRAIN REMOVAL: SHX6697

## 2015-12-20 HISTORY — PX: TRANSURETHRAL RESECTION OF BLADDER TUMOR: SHX2575

## 2015-12-20 LAB — CBC
HEMATOCRIT: 26.8 % — AB (ref 40.0–52.0)
Hemoglobin: 9.1 g/dL — ABNORMAL LOW (ref 13.0–18.0)
MCH: 29.5 pg (ref 26.0–34.0)
MCHC: 34 g/dL (ref 32.0–36.0)
MCV: 86.8 fL (ref 80.0–100.0)
PLATELETS: 167 10*3/uL (ref 150–440)
RBC: 3.09 MIL/uL — AB (ref 4.40–5.90)
RDW: 16.7 % — AB (ref 11.5–14.5)
WBC: 12.2 10*3/uL — AB (ref 3.8–10.6)

## 2015-12-20 LAB — COMPREHENSIVE METABOLIC PANEL
ALT: 13 U/L — ABNORMAL LOW (ref 17–63)
ANION GAP: 3 — AB (ref 5–15)
AST: 19 U/L (ref 15–41)
Albumin: 2.1 g/dL — ABNORMAL LOW (ref 3.5–5.0)
Alkaline Phosphatase: 62 U/L (ref 38–126)
BILIRUBIN TOTAL: 1.4 mg/dL — AB (ref 0.3–1.2)
BUN: 7 mg/dL (ref 6–20)
CHLORIDE: 105 mmol/L (ref 101–111)
CO2: 27 mmol/L (ref 22–32)
Calcium: 7.7 mg/dL — ABNORMAL LOW (ref 8.9–10.3)
Creatinine, Ser: 0.79 mg/dL (ref 0.61–1.24)
Glucose, Bld: 102 mg/dL — ABNORMAL HIGH (ref 65–99)
POTASSIUM: 3.6 mmol/L (ref 3.5–5.1)
Sodium: 135 mmol/L (ref 135–145)
TOTAL PROTEIN: 5.4 g/dL — AB (ref 6.5–8.1)

## 2015-12-20 LAB — GLUCOSE, CAPILLARY
Glucose-Capillary: 102 mg/dL — ABNORMAL HIGH (ref 65–99)
Glucose-Capillary: 107 mg/dL — ABNORMAL HIGH (ref 65–99)
Glucose-Capillary: 90 mg/dL (ref 65–99)
Glucose-Capillary: 89 mg/dL (ref 65–99)
Glucose-Capillary: 158 mg/dL — ABNORMAL HIGH (ref 65–99)

## 2015-12-20 SURGERY — TURBT (TRANSURETHRAL RESECTION OF BLADDER TUMOR)
Anesthesia: General | Laterality: Right | Wound class: Clean Contaminated

## 2015-12-20 MED ORDER — IPRATROPIUM-ALBUTEROL 0.5-2.5 (3) MG/3ML IN SOLN: 3.0000 mL | Freq: Once | RESPIRATORY_TRACT | Status: DC

## 2015-12-20 MED ORDER — ROCURONIUM BROMIDE 100 MG/10ML IV SOLN
INTRAVENOUS | Status: DC | PRN
  Administered 2015-12-20: 20 mg via INTRAVENOUS
  Administered 2015-12-20: 30 mg via INTRAVENOUS

## 2015-12-20 MED ORDER — PROPOFOL 10 MG/ML IV BOLUS
INTRAVENOUS | Status: DC | PRN
  Administered 2015-12-20: 150 mg via INTRAVENOUS

## 2015-12-20 MED ORDER — PHENYLEPHRINE HCL 10 MG/ML IJ SOLN
INTRAMUSCULAR | Status: DC | PRN
  Administered 2015-12-20 (×2): 100 ug via INTRAVENOUS

## 2015-12-20 MED ORDER — SUGAMMADEX SODIUM 200 MG/2ML IV SOLN
INTRAVENOUS | Status: DC | PRN
  Administered 2015-12-20: 200 mg via INTRAVENOUS

## 2015-12-20 MED ORDER — SODIUM CHLORIDE 0.9 % IR SOLN
3000.0000 mL | Status: DC
  Administered 2015-12-22: 3000 mL

## 2015-12-20 MED ORDER — SODIUM CHLORIDE 0.9 % IV SOLN
INTRAVENOUS | Status: DC | PRN
  Administered 2015-12-20: 13:00:00 via INTRAVENOUS

## 2015-12-20 MED ORDER — FENTANYL CITRATE (PF) 100 MCG/2ML IJ SOLN
25.0000 ug | INTRAMUSCULAR | Status: DC | PRN
Start: 2015-12-20 — End: 2015-12-20

## 2015-12-20 MED ORDER — SUCCINYLCHOLINE CHLORIDE 20 MG/ML IJ SOLN
INTRAMUSCULAR | Status: DC | PRN
  Administered 2015-12-20: 100 mg via INTRAVENOUS

## 2015-12-20 MED ORDER — IPRATROPIUM-ALBUTEROL 0.5-2.5 (3) MG/3ML IN SOLN: 3.0000 mL | Freq: Four times a day (QID) | RESPIRATORY_TRACT | Status: DC

## 2015-12-20 MED ORDER — FENTANYL CITRATE (PF) 100 MCG/2ML IJ SOLN
INTRAMUSCULAR | Status: DC | PRN
  Administered 2015-12-20 (×2): 50 ug via INTRAVENOUS
  Administered 2015-12-20: 100 ug via INTRAVENOUS

## 2015-12-20 MED ORDER — ONDANSETRON HCL 4 MG/2ML IJ SOLN
INTRAMUSCULAR | Status: DC | PRN
  Administered 2015-12-20: 4 mg via INTRAVENOUS

## 2015-12-20 MED ORDER — OXYCODONE HCL 5 MG/5ML PO SOLN: 5.0000 mg | Freq: Once | ORAL | Status: DC | PRN

## 2015-12-20 MED ORDER — LIDOCAINE HCL (CARDIAC) 20 MG/ML IV SOLN
INTRAVENOUS | Status: DC | PRN
  Administered 2015-12-20: 100 mg via INTRAVENOUS

## 2015-12-20 MED ORDER — OXYCODONE HCL 5 MG PO TABS
5.0000 mg | ORAL_TABLET | Freq: Once | ORAL | Status: DC | PRN
Start: 2015-12-20 — End: 2015-12-20

## 2015-12-20 MED ORDER — IPRATROPIUM-ALBUTEROL 0.5-2.5 (3) MG/3ML IN SOLN
3.0000 mL | Freq: Once | RESPIRATORY_TRACT | Status: AC
  Administered 2015-12-20: 3 mL via RESPIRATORY_TRACT

## 2015-12-20 MED ORDER — MIDAZOLAM HCL 2 MG/2ML IJ SOLN
INTRAMUSCULAR | Status: DC | PRN
  Administered 2015-12-20: 2 mg via INTRAVENOUS

## 2015-12-20 SURGICAL SUPPLY — 34 items
BACTOSHIELD CHG 4% 4OZ (MISCELLANEOUS) ×1
BAG DRAIN CYSTO-URO LG1000N (MISCELLANEOUS) ×4 IMPLANT
BAG URO DRAIN 2000ML W/SPOUT (MISCELLANEOUS) IMPLANT
CATH FOL 3WAY LX COUV 24X75 (CATHETERS) ×2 IMPLANT
CATH FOL LEG HOLDER (MISCELLANEOUS) IMPLANT
CATH FOLEY 2WAY  5CC 16FR (CATHETERS)
CATH FOLEY 2WAY 5CC 16FR (CATHETERS)
CATH FOLEY 3WAY 30CC 24FR (CATHETERS)
CATH URETL 5X70 OPEN END (CATHETERS) ×4 IMPLANT
CATH URTH 16FR FL 2W BLN LF (CATHETERS) IMPLANT
CATH URTH STD 24FR FL 3W 2 (CATHETERS) IMPLANT
CONRAY 43 FOR UROLOGY 50M (MISCELLANEOUS) ×4 IMPLANT
ELECT LOOP 22F BIPOLAR SML (ELECTROSURGICAL) ×4
ELECT REM PT RETURN 9FT ADLT (ELECTROSURGICAL) ×4
ELECTRODE LOOP 22F BIPOLAR SML (ELECTROSURGICAL) ×3 IMPLANT
ELECTRODE REM PT RTRN 9FT ADLT (ELECTROSURGICAL) ×3 IMPLANT
EVACUATOR ELLICK (MISCELLANEOUS) ×4 IMPLANT
GLOVE BIO SURGEON STRL SZ7 (GLOVE) ×8 IMPLANT
GLOVE BIO SURGEON STRL SZ7.5 (GLOVE) ×4 IMPLANT
GOWN STRL REUS W/ TWL LRG LVL3 (GOWN DISPOSABLE) ×6 IMPLANT
GOWN STRL REUS W/TWL LRG LVL3 (GOWN DISPOSABLE) ×8
GOWN STRL REUS W/TWL XL LVL3 (GOWN DISPOSABLE) ×4 IMPLANT
KIT RM TURNOVER CYSTO AR (KITS) ×4 IMPLANT
LOOP CUT BIPOLAR 24F LRG (ELECTROSURGICAL) IMPLANT
PACK CYSTO AR (MISCELLANEOUS) ×4 IMPLANT
SCRUB CHG 4% DYNA-HEX 4OZ (MISCELLANEOUS) ×3 IMPLANT
SENSORWIRE 0.038 NOT ANGLED (WIRE) ×4
SET IRRIG Y TYPE TUR BLADDER L (SET/KITS/TRAYS/PACK) ×4 IMPLANT
SOL .9 NS 3000ML IRR  AL (IV SOLUTION) ×9
SOL .9 NS 3000ML IRR AL (IV SOLUTION) ×27
SOL .9 NS 3000ML IRR UROMATIC (IV SOLUTION) ×13 IMPLANT
SURGILUBE 2OZ TUBE FLIPTOP (MISCELLANEOUS) ×4 IMPLANT
WATER STERILE IRR 1000ML POUR (IV SOLUTION) ×10 IMPLANT
WIRE SENSOR 0.038 NOT ANGLED (WIRE) ×3 IMPLANT

## 2015-12-20 NOTE — Progress Notes (Signed)
Present in the operating room for cystoscopy and TURBT. The JP drain was removed prior to starting the procedure. A dressing was placed. I observed the cystoscopy and TURBT where a large tumor was encountered and partially resected. See urology op note

## 2015-12-20 NOTE — Anesthesia Postprocedure Evaluation (Signed)
Anesthesia Post Note  Patient: Frederick Perry  Procedure(s) Performed: Procedure(s) (LRB): TRANSURETHRAL RESECTION OF BLADDER TUMOR (TURBT) (N/A) DRAIN REMOVAL (Right)  Patient location during evaluation: PACU Anesthesia Type: General Level of consciousness: awake Pain management: satisfactory to patient Vital Signs Assessment: post-procedure vital signs reviewed and stable Respiratory status: spontaneous breathing Cardiovascular status: stable Anesthetic complications: no    Last Vitals:  Filed Vitals:   12/20/15 1200 12/20/15 1409  BP: 132/75 159/77  Pulse: 88 88  Temp: 36.5 C 36.6 C  Resp: 16 14    Last Pain:  Filed Vitals:   12/20/15 1413  PainSc: 0-No pain                 VAN STAVEREN,Aahil Fredin

## 2015-12-20 NOTE — Op Note (Signed)
Date of procedure: 12/20/2015  Preoperative diagnosis:  1. Bladder tumor   Postoperative diagnosis:  1. Bladder tumor   Procedure: 1. Transurethral resection of bladder tumor-10 cm  Surgeon: Baruch Gouty, MD  Anesthesia: General  Complications: None  Intraoperative findings: The patient had a large amount of well-formed organized clot that was removed. He also had a bladder tumor that originated in the left lateral wall that continued clockwise along the base of the bladder up the right lateral wall to the dome. It encompassed at least half of his bladder. The proximal 75% of this tumor was resected. Muscle was seen in the resection bed.  EBL: None  Specimens: Bladder tumor to pathology  Drains: 24 French three-way hematuria catheter on CBI  Disposition: Stable to the postanesthesia care unit  Indication for procedure: The patient is a 60 y.o. male with a bladder tumor that was large in size and was lost to follow-up. He returned to the hospital after having a gastric ulcer perforation. He is since recovered from the perforation. Presents today for staged transurethral resection of bladder tumor.  After reviewing the management options for treatment, the patient elected to proceed with the above surgical procedure(s). We have discussed the potential benefits and risks of the procedure, side effects of the proposed treatment, the likelihood of the patient achieving the goals of the procedure, and any potential problems that might occur during the procedure or recuperation. Informed consent has been obtained.  Description of procedure: The patient was met in the preoperative area. All risks, benefits, and indications of the procedure were described in great detail. The patient consented to the procedure. Preoperative antibiotics were given. The patient was taken to the operative theater. General anesthesia was induced per the anesthesia service. The patient was then placed in the dorsal  lithotomy position and prepped and draped in the usual sterile fashion. A preoperative timeout was called.   A 24 French 30 cystoscope and resectoscope visual obturator was inserted in the patient's bladder per urethra to medical. Upon entering the bladder patient a large amount of organized clot that took approximately 25 minutes to remove. The patient then had a bladder tumor as described above. This was systematically excised with the resectoscope starting on the left lateral wall moving clockwise toward the right lateral wall. In the posterior portion of bladder there was some fat seen. The tumor is resected up approximately halfway up the right lateral wall. Leaving the remaining tumor at the dome untouched. The most part the tumor during those areas was removed. There is still significant residual tumor. I estimate approximately 25%. The bladder tumor was evacuated with Ellik evacuator. Hemostasis was then obtained and was excellent. The sensor was then removed. 24 French hematuria catheter was then placed to CBI. Clear pink urine then returned. The patient was awoken from anesthesia and transferred in stable condition to postanesthesia care unit.  Plan: The patient will return to the floor his bladder irrigation. Our goal will be to wean his CBI overnight. He'll be discharged home for a catheter to keep for 1 week. Pending the pathology results he will either need a repeat TURBT or cystectomy. We'll discuss results at the time of his Foley catheter removal.  Baruch Gouty, M.D.

## 2015-12-20 NOTE — Progress Notes (Signed)
6 Days Post-Op  Subjective: Patient feels well today he's tolerating a diet but is nothing by mouth at this point for a procedure to diagnose a bladder mass.  Objective: Vital signs in last 24 hours: Temp:  [98.4 F (36.9 C)-99.6 F (37.6 C)] 98.4 F (36.9 C) (07/06 0501) Pulse Rate:  [82-99] 82 (07/06 0501) Resp:  [16-20] 17 (07/06 0501) BP: (131-158)/(70-91) 139/70 mmHg (07/06 0501) SpO2:  [98 %-100 %] 99 % (07/06 0501) Last BM Date:  (PTA)  Intake/Output from previous day: 07/05 0701 - 07/06 0700 In: 2250 [P.O.:240; I.V.:10; Blood:600; IV Piggyback:100] Out: 1080 [Urine:1000; Drains:80] Intake/Output this shift: Total I/O In: -  Out: 600 [Urine:600]  Physical exam:  Abdomen soft nontender wounds clean drain is in place without problems. Foley catheter is in place with frank hematuria  Lab Results: CBC   Recent Labs  12/19/15 0210 12/20/15 0351  WBC 11.8* 12.2*  HGB 7.4* 9.1*  HCT 22.1* 26.8*  PLT 164 167   BMET  Recent Labs  12/19/15 0210 12/20/15 0351  NA 134* 135  K 3.2* 3.6  CL 102 105  CO2 28 27  GLUCOSE 126* 102*  BUN 9 7  CREATININE 0.82 0.79  CALCIUM 7.8* 7.7*   PT/INR No results for input(s): LABPROT, INR in the last 72 hours. ABG No results for input(s): PHART, HCO3 in the last 72 hours.  Invalid input(s): PCO2, PO2  Studies/Results: Dg Chest 2 View  12/19/2015  CLINICAL DATA:  Fever.  Bladder mass. EXAM: CHEST  2 VIEW COMPARISON:  12/16/2015. FINDINGS: Interim removal of NG tube. Right IJ line in stable position. Mediastinum hilar structures normal. Low lung volumes with bibasilar atelectasis and/or infiltrates. One near complete clearing of left perihilar infiltrate. Small bilateral pleural effusions. No pneumothorax. IMPRESSION: 1.  Interim removal of NG tube.  Right IJ line stable position. 2 Low lung volumes with mild bibasilar atelectasis and/or infiltrates. Interim near complete clearing of left perihilar/left upper lobe infiltrate.  Small bilateral pleural effusions. 3.  Cardiomegaly. Electronically Signed   By: Marcello Moores  Register   On: 12/19/2015 08:22    Anti-infectives: Anti-infectives    Start     Dose/Rate Route Frequency Ordered Stop   12/14/15 1100  piperacillin-tazobactam (ZOSYN) IVPB 3.375 g     3.375 g 12.5 mL/hr over 240 Minutes Intravenous Every 8 hours 12/14/15 0849     12/14/15 0400  piperacillin-tazobactam (ZOSYN) IVPB 3.375 g     3.375 g 100 mL/hr over 30 Minutes Intravenous  Once 12/14/15 0359 12/14/15 0547      Assessment/Plan: s/p Procedure(s): EXPLORATORY LAPAROTOMY, prepyloric gastric ulcer biopsy   Patient doing very well today is nothing by mouth for a urologic procedure to diagnose and potentially treat a large bladder mass with frank hematuria. I suggested that I would remove the drain while he is under anesthesia in the operating room and this was discussed with the patient as well as the OR staff. He will likely be able to be discharged soon.  [name], MD, FACS  12/20/2015

## 2015-12-20 NOTE — Transfer of Care (Signed)
Immediate Anesthesia Transfer of Care Note  Patient: Frederick Perry  Procedure(s) Performed: Procedure(s) with comments: TRANSURETHRAL RESECTION OF BLADDER TUMOR (TURBT) (N/A) DRAIN REMOVAL (Right) - by dr copper  Patient Location: PACU  Anesthesia Type:General  Level of Consciousness: awake, alert , oriented and patient cooperative  Airway & Oxygen Therapy: Patient Spontanous Breathing and Patient connected to face mask oxygen  Post-op Assessment: Report given to RN, Post -op Vital signs reviewed and stable and Patient moving all extremities X 4  Post vital signs: Reviewed and stable  Last Vitals:  Filed Vitals:   12/20/15 0501 12/20/15 1200  BP: 139/70 132/75  Pulse: 82 88  Temp: 36.9 C 36.5 C  Resp: 17 16    Last Pain:  Filed Vitals:   12/20/15 1235  PainSc: 0-No pain      Patients Stated Pain Goal: 0 [phone]  Complications: No apparent anesthesia complications

## 2015-12-20 NOTE — Anesthesia Preprocedure Evaluation (Signed)
Anesthesia Evaluation  Patient identified by MRN, date of birth, ID band Patient awake    Reviewed: Allergy & Precautions, H&P , NPO status , Patient's Chart, lab work & pertinent test results  History of Anesthesia Complications Negative for: history of anesthetic complications  Airway Mallampati: III  TM Distance: >3 FB Neck ROM: limited    Dental  (+) Poor Dentition, Chipped   Pulmonary neg shortness of breath, COPD, Current Smoker,    Pulmonary exam normal breath sounds clear to auscultation       Cardiovascular Exercise Tolerance: Good hypertension, (-) angina+ CAD and + Past MI  (-) DOE Normal cardiovascular exam Rhythm:regular Rate:Normal     Neuro/Psych negative neurological ROS  negative psych ROS   GI/Hepatic negative GI ROS, Neg liver ROS,   Endo/Other  diabetes, Type 2  Renal/GU negative Renal ROS  negative genitourinary   Musculoskeletal   Abdominal   Peds  Hematology negative hematology ROS (+)   Anesthesia Other Findings Past Medical History:   Diabetes mellitus without complication (HCC)                   Comment:Not on medications   Hypertension                                                   Comment:Not on medications   Polysubstance abuse                                            Comment:a. ongoing tobacco and alcohol abuse   Past Surgical History:   BACK SURGERY                                                  Patellar tendon repair                                        Testicular torsion repair                                     CARDIAC CATHETERIZATION                         N/A 09/05/2015      Comment:Procedure: Left Heart Cath and Coronary               Angiography;  Surgeon: Minna Merritts, MD;                Location: [facility];  Service:               Cardiovascular;  Laterality: N/A;   PERIPHERAL VASCULAR CATHETERIZATION             N/A 09/07/2015   Comment:Procedure: IVC Filter Insertion;  Surgeon:               Algernon Huxley, MD;  Location: [facility];  Service: Cardiovascular;  Laterality:               N/A;   LAPAROTOMY                                      N/A 12/14/2015      Comment:Procedure: EXPLORATORY LAPAROTOMY, prepyloric               gastric ulcer biopsy;  Surgeon: Jules Husbands,               MD;  Location: [facility];  Service: General;                Laterality: N/A;  BMI    Body Mass Index   27.85 kg/m 2      Reproductive/Obstetrics negative OB ROS                             Anesthesia Physical Anesthesia Plan  ASA: IV  Anesthesia Plan: General ETT   Post-op Pain Management:    Induction:   Airway Management Planned:   Additional Equipment:   Intra-op Plan:   Post-operative Plan:   Informed Consent: I have reviewed the patients History and Physical, chart, labs and discussed the procedure including the risks, benefits and alternatives for the proposed anesthesia with the patient or authorized representative who has indicated his/her understanding and acceptance.   Dental Advisory Given  Plan Discussed with: Anesthesiologist, CRNA and Surgeon  Anesthesia Plan Comments: (Patient informed that they are higher risk for complications from anesthesia during this procedure due to their medical history.  Patient voiced understanding. )        Anesthesia Quick Evaluation

## 2015-12-20 NOTE — Progress Notes (Signed)
[place] [facility] [place] NAME: Frederick Perry    MR#:  [id number]  DATE OF BIRTH:  10/06/1955  SUBJECTIVE:  CHIEF COMPLAINT:   Chief Complaint  Patient presents with  . Abdominal Pain    Asian feeling okay and will be going to the OR later or urology  REVIEW OF SYSTEMS:  CONSTITUTIONAL: No fever, fatigue or weakness.  EYES: No blurred or double vision.  EARS, NOSE, AND THROAT: No tinnitus or ear pain.  RESPIRATORY: No cough, shortness of breath, wheezing or hemoptysis.  CARDIOVASCULAR: No chest pain, orthopnea, edema.  GASTROINTESTINAL: No nausea, vomiting, diarrhea , some abdominal pain.  GENITOURINARY: No dysuria, positive for hematuria.  ENDOCRINE: No polyuria, nocturia,  HEMATOLOGY: No anemia, easy bruising or bleeding SKIN: No rash or lesion. MUSCULOSKELETAL: No joint pain or arthritis.   NEUROLOGIC: No tingling, numbness, weakness.  PSYCHIATRY: No anxiety or depression.   ROS  DRUG ALLERGIES:  No Known Allergies  VITALS:  Blood pressure 132/75, pulse 88, temperature 97.7 F (36.5 C), temperature source Tympanic, resp. rate 16, height 6\' 1"  (1.854 m), weight 95.754 kg (211 lb 1.6 oz), SpO2 98 %.  PHYSICAL EXAMINATION:  GENERAL:  60 y.o.-year-old patient lying in the bed with no acute distress.  EYES: Pupils equal, round, reactive to light and accommodation. No scleral icterus. Extraocular muscles intact.  HEENT: Head atraumatic, normocephalic. Oropharynx and nasopharynx clear.  NECK:  Supple, no jugular venous distention. No thyroid enlargement, no tenderness.  LUNGS: Normal breath sounds bilaterally, no wheezing, rales,rhonchi or crepitation. No use of accessory muscles of respiration.  CARDIOVASCULAR: S1, S2 normal. No murmurs, rubs, or gallops.  ABDOMEN: Soft, nontender, nondistended. Bowel sounds present. No organomegaly or mass. Dressing and drainage tube in place. Foley catheter with red urine. EXTREMITIES: No pedal  edema, cyanosis, or clubbing.  NEUROLOGIC: Cranial nerves II through XII are intact. Muscle strength 5/5 in all extremities. Sensation intact. Gait not checked.  PSYCHIATRIC: The patient is alert and oriented x 3.  SKIN: No obvious rash, lesion, or ulcer.   Physical Exam LABORATORY PANEL:   CBC  Recent Labs Lab 12/20/15 0351  WBC 12.2*  HGB 9.1*  HCT 26.8*  PLT 167   ------------------------------------------------------------------------------------------------------------------  Chemistries   Recent Labs Lab 12/19/15 0210 12/20/15 0351  NA 134* 135  K 3.2* 3.6  CL 102 105  CO2 28 27  GLUCOSE 126* 102*  BUN 9 7  CREATININE 0.82 0.79  CALCIUM 7.8* 7.7*  MG 1.5*  --   AST  --  19  ALT  --  13*  ALKPHOS  --  62  BILITOT  --  1.4*   ------------------------------------------------------------------------------------------------------------------  Cardiac Enzymes  Recent Labs Lab 12/14/15 0300  TROPONINI 0.07*   ------------------------------------------------------------------------------------------------------------------  RADIOLOGY:  Dg Chest 2 View  12/19/2015  CLINICAL DATA:  Fever.  Bladder mass. EXAM: CHEST  2 VIEW COMPARISON:  12/16/2015. FINDINGS: Interim removal of NG tube. Right IJ line in stable position. Mediastinum hilar structures normal. Low lung volumes with bibasilar atelectasis and/or infiltrates. One near complete clearing of left perihilar infiltrate. Small bilateral pleural effusions. No pneumothorax. IMPRESSION: 1.  Interim removal of NG tube.  Right IJ line stable position. 2 Low lung volumes with mild bibasilar atelectasis and/or infiltrates. Interim near complete clearing of left perihilar/left upper lobe infiltrate. Small bilateral pleural effusions. 3.  Cardiomegaly. Electronically Signed   By: Marcello Moores  Register   On: 12/19/2015 08:22    ASSESSMENT AND PLAN:  Active Problems:   Perforated bowel ([place])  * Perforated viscus and sepsis  and Hemorrhagic shock on admission   S/p surgery   On IV abx, bl cx negative.    Management per surgical team.    * hematuria and bladder mass   Urology consult appreciated.  will  Have cyctoscopy and TURBP.  * Anemia due to blood loss - from Bladder and intraabdominal   Hemoglobin is stable  * hx of PE   S/p IVC , no anticoagulation due to bleeding.  * Ac renal failure on admission   Improved now.  All the records are reviewed and case discussed with Care Management/Social Workerr. Management plans discussed with the patient, family and they are in agreement.  CODE STATUS: full  TOTAL TIME TAKING CARE OF THIS PATIENT: 32 minutes.    Dustin Flock M.D on 12/20/2015   Between 7am to 6pm - Pager - [phone]  After 6pm go to [email or web address] - password [email or web address] [facility]  Office  [phone]  CC: Primary care physician; No PCP Per Patient  Note: This dictation was prepared with Dragon dictation along with smaller phrase technology. Any transcriptional errors that result from this process are unintentional.

## 2015-12-20 NOTE — OR Nursing (Signed)
Patient had been on preop meds at home but had to stop taking them because he had run out of the prescriptions a while ago.  He is unsure how long it has been since he took lisinopril and carvedilol.

## 2015-12-20 NOTE — Anesthesia Procedure Notes (Signed)
Procedure Name: Intubation Date/Time: 12/20/2015 12:43 PM Performed by: Silvana Newness Pre-anesthesia Checklist: Patient identified, Emergency Drugs available, Suction available, Patient being monitored and Timeout performed Patient Re-evaluated:Patient Re-evaluated prior to inductionOxygen Delivery Method: Circle system utilized Preoxygenation: Pre-oxygenation with 100% oxygen Intubation Type: IV induction Ventilation: Mask ventilation without difficulty Laryngoscope Size: Mac and 4 Grade View: Grade I Tube type: Oral Tube size: 7.5 mm Number of attempts: 1 Airway Equipment and Method: Rigid stylet Placement Confirmation: ETT inserted through vocal cords under direct vision,  positive ETCO2 and breath sounds checked- equal and bilateral Secured at: 21 cm Tube secured with: Tape Dental Injury: Teeth and Oropharynx as per pre-operative assessment

## 2015-12-21 ENCOUNTER — Telehealth: Payer: Self-pay

## 2015-12-21 LAB — TYPE AND SCREEN
ABO/RH(D): O POS
ANTIBODY SCREEN: NEGATIVE
UNIT DIVISION: 0
Unit division: 0

## 2015-12-21 LAB — GLUCOSE, CAPILLARY
Glucose-Capillary: 120 mg/dL — ABNORMAL HIGH (ref 65–99)
Glucose-Capillary: 148 mg/dL — ABNORMAL HIGH (ref 65–99)
Glucose-Capillary: 149 mg/dL — ABNORMAL HIGH (ref 65–99)
Glucose-Capillary: 107 mg/dL — ABNORMAL HIGH (ref 65–99)

## 2015-12-21 MED ORDER — HYDROCODONE-ACETAMINOPHEN 5-300 MG PO TABS: 1.0000 | ORAL_TABLET | ORAL | Status: DC | PRN

## 2015-12-21 MED ORDER — CEPHALEXIN 500 MG PO CAPS: 500.0000 mg | ORAL_CAPSULE | Freq: Four times a day (QID) | ORAL | Status: DC

## 2015-12-21 MED ORDER — FAMOTIDINE 40 MG PO TABS: 40.0000 mg | ORAL_TABLET | Freq: Every day | ORAL | Status: DC

## 2015-12-21 MED ORDER — ENSURE ENLIVE PO LIQD
237.0000 mL | Freq: Three times a day (TID) | ORAL | Status: DC
  Administered 2015-12-21: 237 mL via ORAL

## 2015-12-21 NOTE — Progress Notes (Signed)
1 Day Post-Op  Subjective: Patient had cystoscopy yesterday with TURBT. He's doing well having no problems and no pain. He is tolerating a regular diet. Drain was removed in the operating room.  Objective: Vital signs in last 24 hours: Temp:  [97.4 F (36.3 C)-98.7 F (37.1 C)] 98.7 F (37.1 C) (07/07 0940) Pulse Rate:  [75-97] 77 (07/07 0940) Resp:  [14-19] 19 (07/06 2101) BP: (130-159)/(75-93) 141/76 mmHg (07/07 0940) SpO2:  [38 %-100 %] 100 % (07/07 0940) Last BM Date: 12/20/15  Intake/Output from previous day: 07/06 0701 - 07/07 0700 In: 15490 [P.O.:120; I.V.:820; IV Piggyback:50] Out: [id number] [Urine:16400; Blood:10] Intake/Output this shift: Total I/O In: 0  Out: 3000 [Urine:3000]  Physical exam:  No icterus no jaundice abdomen is soft nondistended nontympanitic and nontender wounds are clean without erythema minimal drainage from the drain site no purulence no bile. Hematuria and Foley bag.  Lab Results: CBC   Recent Labs  12/19/15 0210 12/20/15 0351  WBC 11.8* 12.2*  HGB 7.4* 9.1*  HCT 22.1* 26.8*  PLT 164 167   BMET  Recent Labs  12/19/15 0210 12/20/15 0351  NA 134* 135  K 3.2* 3.6  CL 102 105  CO2 28 27  GLUCOSE 126* 102*  BUN 9 7  CREATININE 0.82 0.79  CALCIUM 7.8* 7.7*   PT/INR No results for input(s): LABPROT, INR in the last 72 hours. ABG No results for input(s): PHART, HCO3 in the last 72 hours.  Invalid input(s): PCO2, PO2  Studies/Results: No results found.  Anti-infectives: Anti-infectives    Start     Dose/Rate Route Frequency Ordered Stop   12/14/15 1100  piperacillin-tazobactam (ZOSYN) IVPB 3.375 g     3.375 g 12.5 mL/hr over 240 Minutes Intravenous Every 8 hours 12/14/15 0849     12/14/15 0400  piperacillin-tazobactam (ZOSYN) IVPB 3.375 g     3.375 g 100 mL/hr over 30 Minutes Intravenous  Once 12/14/15 0359 12/14/15 0547      Assessment/Plan: s/p Procedure(s): TRANSURETHRAL RESECTION OF BLADDER TUMOR (TURBT) DRAIN  REMOVAL   Patient doing very well has a large bladder tumor and pathology is pending. He has a catheter in place the plan would be to discharge him either later today or tomorrow depending on urology is decision. Urology believes he can go home this afternoon and I will see him later today and probably discharge.  [name], MD, FACS  12/21/2015

## 2015-12-21 NOTE — Progress Notes (Addendum)
[place] [facility] [place] NAME: Frederick Perry    MR#:  [id number]  DATE OF BIRTH:  01-31-1956  SUBJECTIVE:  CHIEF COMPLAINT:   Chief Complaint  Patient presents with  . Abdominal Pain    Patient underwent cystoscopy with TURBT, doing well tolerating diet denies any complaints  REVIEW OF SYSTEMS:  CONSTITUTIONAL: No fever, fatigue or weakness.  EYES: No blurred or double vision.  EARS, NOSE, AND THROAT: No tinnitus or ear pain.  RESPIRATORY: No cough, shortness of breath, wheezing or hemoptysis.  CARDIOVASCULAR: No chest pain, orthopnea, edema.  GASTROINTESTINAL: No nausea, vomiting, diarrhea , some abdominal pain.  GENITOURINARY: No dysuria, positive for hematuria.  ENDOCRINE: No polyuria, nocturia,  HEMATOLOGY: No anemia, easy bruising or bleeding SKIN: No rash or lesion. MUSCULOSKELETAL: No joint pain or arthritis.   NEUROLOGIC: No tingling, numbness, weakness.  PSYCHIATRY: No anxiety or depression.   ROS  DRUG ALLERGIES:  No Known Allergies  VITALS:  Blood pressure 141/76, pulse 77, temperature 98.7 F (37.1 C), temperature source Oral, resp. rate 19, height 6\' 1"  (1.854 m), weight 95.754 kg (211 lb 1.6 oz), SpO2 100 %.  PHYSICAL EXAMINATION:  GENERAL:  60 y.o.-year-old patient lying in the bed with no acute distress.  EYES: Pupils equal, round, reactive to light and accommodation. No scleral icterus. Extraocular muscles intact.  HEENT: Head atraumatic, normocephalic. Oropharynx and nasopharynx clear.  NECK:  Supple, no jugular venous distention. No thyroid enlargement, no tenderness.  LUNGS: Normal breath sounds bilaterally, no wheezing, rales,rhonchi or crepitation. No use of accessory muscles of respiration.  CARDIOVASCULAR: S1, S2 normal. No murmurs, rubs, or gallops.  ABDOMEN: Soft, nontender, nondistended. Bowel sounds present. No organomegaly or mass. Dressing and drainage tube in place. Foley catheter with red  urine. EXTREMITIES: No pedal edema, cyanosis, or clubbing.  NEUROLOGIC: Cranial nerves II through XII are intact. Muscle strength 5/5 in all extremities. Sensation intact. Gait not checked.  PSYCHIATRIC: The patient is alert and oriented x 3.  SKIN: No obvious rash, lesion, or ulcer.   Physical Exam LABORATORY PANEL:   CBC  Recent Labs Lab 12/20/15 0351  WBC 12.2*  HGB 9.1*  HCT 26.8*  PLT 167   ------------------------------------------------------------------------------------------------------------------  Chemistries   Recent Labs Lab 12/19/15 0210 12/20/15 0351  NA 134* 135  K 3.2* 3.6  CL 102 105  CO2 28 27  GLUCOSE 126* 102*  BUN 9 7  CREATININE 0.82 0.79  CALCIUM 7.8* 7.7*  MG 1.5*  --   AST  --  19  ALT  --  13*  ALKPHOS  --  62  BILITOT  --  1.4*   ------------------------------------------------------------------------------------------------------------------  Cardiac Enzymes No results for input(s): TROPONINI in the last 168 hours. ------------------------------------------------------------------------------------------------------------------  RADIOLOGY:  No results found.  ASSESSMENT AND PLAN:   Active Problems:   Perforated bowel ([place])  * Perforated viscus and sepsis and Hemorrhagic shock on admission   S/p surgery Anabiotic's per surgery    Management per surgical team.    * hematuria and bladder mass   Urology consult appreciated.  will  Have cyctoscopy and TURBP.  * Anemia due to blood loss - from Bladder and intraabdominal   Hemoglobin is stable  * hx of PE   S/p IVC , no anticoagulation due to bleeding.  * Ac renal failure on admission   Improved now.   Please call with questions will sign off  All the records are reviewed and case discussed with Care  Management/Social Workerr. Management plans discussed with the patient, family and they are in agreement.  CODE STATUS: full  TOTAL TIME TAKING CARE OF THIS PATIENT:  25 minutes.    Dustin Flock M.D on 12/21/2015   Between 7am to 6pm - Pager - [phone]  After 6pm go to [email or web address] - password [email or web address] [facility]  Office  [phone]  CC: Primary care physician; No PCP Per Patient  Note: This dictation was prepared with Dragon dictation along with smaller phrase technology. Any transcriptional errors that result from this process are unintentional.

## 2015-12-21 NOTE — Progress Notes (Signed)
Pharmacy Antibiotic Note  Frederick Perry is a 60 y.o. male admitted on 12/14/2015 with perforated bowel s/p laparotomy.  Pharmacy consulted for Zosyn dosing.  Plan: Will give Zosyn 3.375 g IV q8 hours.   Height: 6\' 1"  (185.4 cm) Weight: 211 lb 1.6 oz (95.754 kg) IBW/kg (Calculated) : 79.9  Temp (24hrs), Avg:98 F (36.7 C), Min:97.4 F (36.3 C), Max:98.7 F (37.1 C)   Recent Labs Lab 12/16/15 0518 12/17/15 0415 12/18/15 0320 12/19/15 0210 12/20/15 0351  WBC 20.4* 17.0* 13.8* 11.8* 12.2*  CREATININE 0.96 0.94 0.88 0.82 0.79    Estimated Creatinine Clearance: 113.7 mL/min (by C-G formula based on Cr of 0.79).    No Known Allergies  Antimicrobials this admission: Zosyn 6/30 >>   Dose adjustments this admission:  Microbiology results: 6/30 BCx: ng x 2d 6/30 UCx: pending  7/1- trach aspirate cx: 7/1 MRSA PCR neg.   Thank you for allowing pharmacy to be a part of this patient's care.  Anothony Bursch D 12/21/2015 8:38 AM

## 2015-12-21 NOTE — Progress Notes (Signed)
No events overnight Pain controlled No n/v/f/c  Filed Vitals:   12/20/15 1603 12/20/15 1708 12/20/15 2101 12/21/15 0500  BP:  143/86 146/86 136/78  Pulse: 76 78 95 97  Temp:  98.1 F (36.7 C) 98.5 F (36.9 C) 98.7 F (37.1 C)  TempSrc:   Oral Oral  Resp:   19   Height:      Weight:      SpO2: 98% 100% 99% 99%   CBI  NAD Soft NT ND Foley light pink minimal cbi  POD 1 TURBT/clot evac. Doing well -hold CBI -if doing well at lunch time with CBI off, will be ok to d/c home from urology standpoint with outpt follow up for trial of void and to discuss pathology

## 2015-12-21 NOTE — Care Management (Addendum)
Plan for patient to discharge on 12/22/15. Home with Keflex, Vicodin, and Pepcid.  I have obtained the Keflex from Medication management at no cost to the patient. Bedside RN to deliver to the patient.  Coupon printed from [employer] at patients request for Vicodin.  Out of pocket cost $27.  Patient states that he will be able to pay out of pocket.  Coupon was placed on chart with hard copy of prescription.  Patient has been provided with another copy of the application for Medication Management and [facility].  I have stressed to him the importance of following through this time on completing this packets for follow up.   Patient states that he was on Metformin prior to admission and asks if this will be prescribed at discharge. Bedside RN has clarified with Dr. Burt Knack and will no be ordered at discharge.

## 2015-12-21 NOTE — Telephone Encounter (Signed)
-----   Message from Nickie Retort, MD sent at 12/21/2015  1:13 PM EDT ----- Patient needs to see me next week for foley removal and to discuss pathology. Please schedule before lunch if need to double book. Thanks.

## 2015-12-21 NOTE — Progress Notes (Signed)
Foley light clear red without clots with CBI held -will restart CBI -plan to hold CBI at 5 AM tomorrow. Will reassess for discharge in AM. Plan for home with foley for one week at time of discharge

## 2015-12-22 DIAGNOSIS — R310 Gross hematuria: Secondary | ICD-10-CM

## 2015-12-22 LAB — GLUCOSE, CAPILLARY
Glucose-Capillary: 107 mg/dL — ABNORMAL HIGH (ref 65–99)
Glucose-Capillary: 93 mg/dL (ref 65–99)

## 2015-12-22 NOTE — Progress Notes (Signed)
2 Days Post-Op  Subjective: Patient feels well today he's tolerating a diet having no problems.  Objective: Vital signs in last 24 hours: Temp:  [98.2 F (36.8 C)-99 F (37.2 C)] 98.2 F (36.8 C) (07/08 0512) Pulse Rate:  [77-102] 84 (07/08 0736) Resp:  [20] 20 (07/08 0512) BP: (141-157)/(76-113) 143/80 mmHg (07/08 0736) SpO2:  [100 %] 100 % (07/08 0512) Last BM Date: 12/20/15  Intake/Output from previous day: 07/07 0701 - 07/08 0700 In: [id number] [P.O.:240; IV Piggyback:100] Out: [id number] [Urine:15250] Intake/Output this shift:    Physical exam:  Abdomen soft nontender wounds clean no erythema or drainage. Calves are nontender. Foley catheter in place with visible hematuria but no clots.  Lab Results: CBC   Recent Labs  12/20/15 0351  WBC 12.2*  HGB 9.1*  HCT 26.8*  PLT 167   BMET  Recent Labs  12/20/15 0351  NA 135  K 3.6  CL 105  CO2 27  GLUCOSE 102*  BUN 7  CREATININE 0.79  CALCIUM 7.7*   PT/INR No results for input(s): LABPROT, INR in the last 72 hours. ABG No results for input(s): PHART, HCO3 in the last 72 hours.  Invalid input(s): PCO2, PO2  Studies/Results: No results found.  Anti-infectives: Anti-infectives    Start     Dose/Rate Route Frequency Ordered Stop   12/21/15 0000  cephALEXin (KEFLEX) 500 MG capsule     500 mg Oral 4 times daily 12/21/15 1122     12/14/15 1100  piperacillin-tazobactam (ZOSYN) IVPB 3.375 g     3.375 g 12.5 mL/hr over 240 Minutes Intravenous Every 8 hours 12/14/15 0849     12/14/15 0400  piperacillin-tazobactam (ZOSYN) IVPB 3.375 g     3.375 g 100 mL/hr over 30 Minutes Intravenous  Once 12/14/15 0359 12/14/15 0547      Assessment/Plan: s/p Procedure(s): TRANSURETHRAL RESECTION OF BLADDER TUMOR (TURBT) DRAIN REMOVAL   Patient doing very well but will recommend waiting for urology input to decide about discharge with Foley catheter will see later today.  [name], MD, FACS  12/22/2015

## 2015-12-22 NOTE — Progress Notes (Signed)
Note by a Dr. Doroteo Bradford suggested the patient can go home this afternoon. Patient's feeling very well having no problems and tolerating a regular diet. We'll discharge this afternoon to follow-up with urology on July 13 and with general surgery next week as well.

## 2015-12-22 NOTE — Progress Notes (Signed)
2 Days Post-Op Subjective: CBI turned off at 5am and urine is light pink with a few tiny clots.  He denies any suprapubic pain  Objective: Vital signs in last 24 hours: Temp:  [98.2 F (36.8 C)-99 F (37.2 C)] 98.2 F (36.8 C) (07/08 0512) Pulse Rate:  [84-102] 84 (07/08 0736) Resp:  [20] 20 (07/08 0512) BP: (143-157)/(80-113) 143/80 mmHg (07/08 0736) SpO2:  [100 %] 100 % (07/08 0512)  Intake/Output from previous day: 07/07 0701 - 07/08 0700 In: [id number] [P.O.:240; IV Piggyback:100] Out: [id number] [Urine:15250] Intake/Output this shift:    Physical Exam:  General:alert, cooperative and appears stated age GI: soft, non tender, normal bowel sounds, no palpable masses, no organomegaly, no inguinal hernia Male genitalia: Penis: circumcised Extremities: extremities normal, atraumatic, no cyanosis or edema  Lab Results:  Recent Labs  12/20/15 0351  HGB 9.1*  HCT 26.8*   BMET  Recent Labs  12/20/15 0351  NA 135  K 3.6  CL 105  CO2 27  GLUCOSE 102*  BUN 7  CREATININE 0.79  CALCIUM 7.7*   No results for input(s): LABPT, INR in the last 72 hours. No results for input(s): LABURIN in the last 72 hours. Results for orders placed or performed during the hospital encounter of 12/14/15  Blood Culture (routine x 2)     Status: None   Collection Time: 12/14/15  5:00 AM  Result Value Ref Range Status   Specimen Description BLOOD RIGHT FA  Final   Special Requests   Final    BOTTLES DRAWN AEROBIC AND ANAEROBIC AER 8ML ANA 9ML   Culture NO GROWTH 5 DAYS  Final   Report Status 12/19/2015 FINAL  Final  Blood Culture (routine x 2)     Status: None   Collection Time: 12/14/15  5:10 AM  Result Value Ref Range Status   Specimen Description BLOOD RIGHT H  Final   Special Requests BOTTLES DRAWN AEROBIC AND ANAEROBIC 8ML  Final   Culture NO GROWTH 5 DAYS  Final   Report Status 12/19/2015 FINAL  Final  Culture, expectorated sputum-assessment     Status: None   Collection Time: 12/15/15  12:17 PM  Result Value Ref Range Status   Specimen Description TRACHEAL ASPIRATE  Final   Special Requests NONE  Final   Sputum evaluation THIS SPECIMEN IS ACCEPTABLE FOR SPUTUM CULTURE  Final   Report Status 12/15/2015 FINAL  Final  Culture, respiratory (NON-Expectorated)     Status: None   Collection Time: 12/15/15 12:17 PM  Result Value Ref Range Status   Specimen Description TRACHEAL ASPIRATE  Final   Special Requests NONE Reflexed from [id number]  Final   Gram Stain   Final    MODERATE WBC PRESENT,BOTH PMN AND MONONUCLEAR FEW SQUAMOUS EPITHELIAL CELLS PRESENT RARE BUDDING YEAST SEEN Performed at [facility]  Final   Report Status 12/19/2015 FINAL  Final  MRSA PCR Screening     Status: None   Collection Time: 12/15/15  5:45 PM  Result Value Ref Range Status   MRSA by PCR NEGATIVE NEGATIVE Final    Comment:        The GeneXpert MRSA Assay (FDA approved for NASAL specimens only), is one component of a comprehensive MRSA colonization surveillance program. It is not intended to diagnose MRSA infection nor to guide or monitor treatment for MRSA infections.   Urine culture     Status: None   Collection Time: 12/16/15 11:00 PM  Result  Value Ref Range Status   Specimen Description URINE, CATHETERIZED  Final   Special Requests NONE  Final   Culture NO GROWTH Performed at [facility]   Final   Report Status 12/18/2015 FINAL  Final  Surgical pcr screen     Status: None   Collection Time: 12/19/15  9:26 PM  Result Value Ref Range Status   MRSA, PCR NEGATIVE NEGATIVE Final   Staphylococcus aureus NEGATIVE NEGATIVE Final    Comment:        The Xpert SA Assay (FDA approved for NASAL specimens in patients over 28 years of age), is one component of a comprehensive surveillance program.  Test performance has been validated by [facility] for patients greater than or equal to 31 year old. It is not intended to diagnose infection  nor to guide or monitor treatment.     Studies/Results: No results found.  Assessment/Plan: 59yo with bladder tumor and hematuria s/p TURBT  Plan: 1. Patient can be discharged home with indwelling foley. The patient should followup 7/13 for a voiding trial   LOS: 8 days   Nicolette Bang 12/22/2015, 10:10 AM

## 2015-12-22 NOTE — Progress Notes (Signed)
Patient discharged to home. Concerns addressed. Central line removed. Prescriptions given. Discharge summary given to patient. Foley standard bag changed to leg bag.  Pt discharged via auxillary

## 2015-12-22 NOTE — Discharge Summary (Signed)
Physician Discharge Summary  Patient ID: Frederick Perry MRN: [id number] DOB/AGE: 10/20/1955 60 y.o.  Admit date: 12/14/2015 Discharge date: 12/22/2015   Discharge Diagnoses:  Active Problems:   Perforated bowel [facility])   Procedures:Exploratory laparotomy with closure of perforated ulcer TURBT  Hospital Course: This a patient who is admitted the hospital diagnosis of a did duodenal ulcer. He was taken to the operating room where this was oversewn and closed. Also of note is the patient has a known bladder tumor with gross hematuria. A catheter had been placed and clots were identified a urology consult was performed and he was taken to the operating room for TURBT. Postoperatively irrigation was performed and his Foley is now fairly clear urology feels he can be discharged at this point. He will be discharged with a Foley leg bag and will follow up with urology next week he will follow-up with general surgery next week as well he is given oral analgesics and a Foley leg bag and taught how to care for it and he may shower.  Consults: Urology, internal medicine  Disposition: 01-Home or Self Care  Discharge Instructions    Leg bag during the day    Complete by:  As directed   Please teach patient Foley and leg bag care and dispense supplies as needed.     Urinary leg bag    Complete by:  As directed             Medication List    STOP taking these medications        enoxaparin 150 MG/ML injection  Commonly known as:  LOVENOX      TAKE these medications        atorvastatin 40 MG tablet  Commonly known as:  LIPITOR  Take 1 tablet (40 mg total) by mouth daily at 6 PM.     carvedilol 3.125 MG tablet  Commonly known as:  COREG  Take 1 tablet (3.125 mg total) by mouth 2 (two) times daily with a meal.     cephALEXin 500 MG capsule  Commonly known as:  KEFLEX  Take 1 capsule (500 mg total) by mouth 4 (four) times daily.     famotidine 40 MG tablet  Commonly known as:  PEPCID   Take 1 tablet (40 mg total) by mouth daily.     Hydrocodone-Acetaminophen 5-300 MG Tabs  Commonly known as:  VICODIN  Take 1 tablet by mouth every 4 (four) hours as needed.           Follow-up Information    Follow up with Nickie Retort, MD In 1 week.   Specialty:  Urology   Why:  foley removal/pathology review   Contact information:   [address] [place] [place] [address] [phone]       Follow up with Jules Husbands, MD In 10 days.   Specialty:  General Surgery   Why:  For wound re-check   Contact information:   [address] [place] [address] [phone]       [name], MD, FACS

## 2015-12-22 NOTE — Discharge Instructions (Signed)
Regular diet May shower Follow-up with urology next week Follow up with Dr. Perrin Maltese next week Routine Foley catheter care with leg bag No lifting

## 2015-12-23 DIAGNOSIS — R310 Gross hematuria: Secondary | ICD-10-CM

## 2015-12-24 LAB — SURGICAL PATHOLOGY

## 2015-12-27 LAB — TYPE AND SCREEN
ABO/RH(D): O POS
Antibody Screen: NEGATIVE
UNIT DIVISION: 0
UNIT DIVISION: 0
UNIT DIVISION: 0
UNIT DIVISION: 0
Unit division: 0
Unit division: 0
Unit division: 0

## 2016-01-04 ENCOUNTER — Ambulatory Visit (INDEPENDENT_AMBULATORY_CARE_PROVIDER_SITE_OTHER): Payer: Self-pay | Admitting: Urology

## 2016-01-04 ENCOUNTER — Encounter: Payer: Self-pay | Admitting: Urology

## 2016-01-04 VITALS — BP 118/81 | HR 108 | Ht 74.0 in | Wt 205.2 lb

## 2016-01-04 DIAGNOSIS — C679 Malignant neoplasm of bladder, unspecified: Secondary | ICD-10-CM

## 2016-01-04 NOTE — Progress Notes (Signed)
01/04/2016 12:21 PM   Frederick Perry Sep 22, 1955 [id number]  Referring provider: No referring provider defined for this encounter.  Chief Complaint  Patient presents with  . Follow-up    pathology, cath removal     HPI: The patient presents for postoperative follow-up after undergoing an incomplete resection of a 10 cm bladder tumor. Presents today for Foley removal and to discuss pathology. 49, his pathology was febrile with TA disease. It was technically high-grade in origin but only 5% of the specimen had high-grade appearance. 95% of the tumor was low-grade pTA.     PMH: Past Medical History  Diagnosis Date  . Polysubstance abuse     a. ongoing tobacco and alcohol abuse   . Hypertension     Not on medications.stopped since he ran out of medications  . Diabetes mellitus without complication [facility])     Not on medications    Surgical History: Past Surgical History  Procedure Laterality Date  . Back surgery    . Patellar tendon repair    . Testicular torsion repair    . Cardiac catheterization N/A 09/05/2015    Procedure: Left Heart Cath and Coronary Angiography;  Surgeon: Minna Merritts, MD;  Location: [facility];  Service: Cardiovascular;  Laterality: N/A;  . Peripheral vascular catheterization N/A 09/07/2015    Procedure: IVC Filter Insertion;  Surgeon: Algernon Huxley, MD;  Location: [place] [facility];  Service: Cardiovascular;  Laterality: N/A;  . Laparotomy N/A 12/14/2015    Procedure: EXPLORATORY LAPAROTOMY, prepyloric gastric ulcer biopsy;  Surgeon: Jules Husbands, MD;  Location: [facility];  Service: General;  Laterality: N/A;  . Transurethral resection of bladder tumor N/A 12/20/2015    Procedure: TRANSURETHRAL RESECTION OF BLADDER TUMOR (TURBT);  Surgeon: Nickie Retort, MD;  Location: [facility] OR[facility];  Service: Urology;  Laterality: N/A;  . Drain removal Right 12/20/2015    Procedure: DRAIN REMOVAL;  Surgeon: Nickie Retort, MD;  Location: [facility];   Service: Urology;  Laterality: Right;  by dr copper    Home Medications:    Medication List       This list is accurate as of: 01/04/16 12:21 PM.  Always use your most recent med list.               atorvastatin 40 MG tablet  Commonly known as:  LIPITOR  Take 1 tablet (40 mg total) by mouth daily at 6 PM.     carvedilol 3.125 MG tablet  Commonly known as:  COREG  Take 1 tablet (3.125 mg total) by mouth 2 (two) times daily with a meal.     cephALEXin 500 MG capsule  Commonly known as:  KEFLEX  Take 1 capsule (500 mg total) by mouth 4 (four) times daily.     famotidine 40 MG tablet  Commonly known as:  PEPCID  Take 1 tablet (40 mg total) by mouth daily.     Hydrocodone-Acetaminophen 5-300 MG Tabs  Commonly known as:  VICODIN  Take 1 tablet by mouth every 4 (four) hours as needed.        Allergies: No Known Allergies  Family History: Family History  Problem Relation Age of Onset  . Congestive Heart Failure Mother   . Peripheral vascular disease Father     Social History:  reports that he has been smoking Cigarettes.  He has been smoking about 1.00 pack per day. He does not have any smokeless tobacco history on file. He reports that  he drinks about 12.6 oz of alcohol per week. He reports that he does not use illicit drugs.  ROS:                                        Physical Exam: BP 118/81 mmHg  Pulse 108  Ht 6\' 2"  (1.88 m)  Wt 205 lb 3.2 oz (93.078 kg)  BMI 26.33 kg/m2  Constitutional:  Alert and oriented, No acute distress. HEENT: [place] AT, moist mucus membranes.  Trachea midline, no masses. Cardiovascular: No clubbing, cyanosis, or edema. Respiratory: Normal respiratory effort, no increased work of breathing. GI: Abdomen is soft, nontender, nondistended, no abdominal masses GU: No CVA tenderness.  Skin: No rashes, bruises or suspicious lesions. Lymph: No cervical or inguinal adenopathy. Neurologic: Grossly intact, no focal  deficits, moving all 4 extremities. Psychiatric: Normal mood and affect.  Laboratory Data: Lab Results  Component Value Date   WBC 12.2* 12/20/2015   HGB 9.1* 12/20/2015   HCT 26.8* 12/20/2015   MCV 86.8 12/20/2015   PLT 167 12/20/2015    Lab Results  Component Value Date   CREATININE 0.79 12/20/2015    No results found for: PSA  No results found for: TESTOSTERONE  Lab Results  Component Value Date   HGBA1C 7.4* 09/05/2015    Urinalysis    Component Value Date/Time   COLORURINE YELLOW* 09/05/2015 1915   APPEARANCEUR CLOUDY* 09/05/2015 1915   LABSPEC 1.031* 09/05/2015 1915   PHURINE 9.0* 09/05/2015 1915   GLUCOSEU NEGATIVE 09/05/2015 1915   HGBUR 3+* 09/05/2015 1915   BILIRUBINUR NEGATIVE 09/05/2015 1915   KETONESUR NEGATIVE 09/05/2015 1915   PROTEINUR 100* 09/05/2015 1915   NITRITE POSITIVE* 09/05/2015 [place] 09/05/2015 1915     Assessment & Plan:   1. Bladder Cancer I went over with the patient has relatively favorable pathology results. He still needs repeat TURBT to finish the resection. He has has a risk, benefits, indications of the procedure. He understands the risks include but are not limited to bleeding, infection, bladder perforation, injury to surrounding structures. He did have bladder spasms at his last procedure so I gave him 3 weeks worth of Myrbetriq 25 mg 4 when he is discharged home with a Foley catheter after his next procedure. All questions were answered. The patient elected to proceed.   Nickie Retort, MD  [facility] [address] [place], [place] [address] [phone]

## 2016-01-04 NOTE — Progress Notes (Signed)
Fill and Pull Catheter Removal  Patient is present today for a catheter removal.  Patient was cleaned and prepped in a sterile fashion 160 ml of sterile water/ saline was instilled into the bladder when the patient felt the urge to urinate. 8 ml of water was then drained from the balloon.  A 24 FR foley cath was removed from the bladder no complications were noted .  Patient as then given some time to void on their own.  Patient can void  127ml on their own after some time.  Patient tolerated well.  Preformed by: K.Gayleen Sholtz,CMA

## 2016-01-07 ENCOUNTER — Encounter: Payer: Self-pay | Admitting: Surgery

## 2016-01-07 ENCOUNTER — Ambulatory Visit (INDEPENDENT_AMBULATORY_CARE_PROVIDER_SITE_OTHER): Payer: Self-pay | Admitting: Surgery

## 2016-01-07 VITALS — BP 135/81 | Temp 97.8°F | Ht 73.0 in | Wt 206.0 lb

## 2016-01-07 DIAGNOSIS — K275 Chronic or unspecified peptic ulcer, site unspecified, with perforation: Secondary | ICD-10-CM

## 2016-01-07 NOTE — Progress Notes (Signed)
Outpatient postop visit  01/07/2016  Frederick Perry is an 60 y.o. male.    Procedure: Port for laparotomy  CC: No problems  HPI: This patient status post exploratory laparotomy and patching of a perforated ulcer. So has bladder cancer and is being evaluated for that is had a TURBT.  Patient has been eating well feeling better gaining strength and having normal bowel movements denies fevers or chills  Medications reviewed.    Physical Exam:  There were no vitals taken for this visit.    PE: Walking normally and appears well. Wound is completely healed staples are removed Steri-Strips and benzoin are placed nontender calves.   Assessment/Plan:  Status post patching of a perforated ulcer. He also has bladder cancer and is being evaluated and treated for that. The patient states that he does not have a cancer. 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 Discussed with the patient and while this is a low-grade cancer and is still a cancer and requires follow-up. He is seeing his urologist for additional resection in the near future. Follow-up with [place] on an as-needed basis.  [name], MD, FACS

## 2016-01-11 ENCOUNTER — Inpatient Hospital Stay: Payer: Self-pay | Attending: Internal Medicine | Admitting: Internal Medicine

## 2016-01-11 ENCOUNTER — Telehealth: Payer: Self-pay | Admitting: Radiology

## 2016-01-11 NOTE — Telephone Encounter (Signed)
LMOM. Need to notify pt of surgery information. 

## 2016-01-14 NOTE — Telephone Encounter (Signed)
sw pt regarding surgery scheduled 02/20/16 needs to be r/s. Offered 8/23 but pt preferred 02/27/16. Notified pt of pre-admit testing appt on 02/14/16 @8 :00 & to call day prior to surgery for arrival time to [facility]. Pt voices understanding. Mailed Surgery & Pre-Admission Testing Information Sheet to pt's home address.

## 2016-01-23 ENCOUNTER — Inpatient Hospital Stay: Payer: MEDICAID | Admitting: Internal Medicine

## 2016-01-24 ENCOUNTER — Other Ambulatory Visit: Payer: Self-pay | Admitting: Radiology

## 2016-01-24 DIAGNOSIS — C679 Malignant neoplasm of bladder, unspecified: Secondary | ICD-10-CM

## 2016-02-06 ENCOUNTER — Other Ambulatory Visit: Payer: Self-pay

## 2016-02-14 ENCOUNTER — Other Ambulatory Visit: Payer: Self-pay

## 2016-02-19 ENCOUNTER — Other Ambulatory Visit: Payer: Self-pay

## 2016-02-22 ENCOUNTER — Encounter
Admission: RE | Admit: 2016-02-22 | Discharge: 2016-02-22 | Disposition: A | Discharge status: Home / Self Care | Payer: Medicaid Other | Source: Ambulatory Visit | Attending: Urology | Admitting: Urology

## 2016-02-22 DIAGNOSIS — Z01810 Encounter for preprocedural cardiovascular examination: Secondary | ICD-10-CM | POA: Diagnosis present

## 2016-02-22 DIAGNOSIS — I10 Essential (primary) hypertension: Secondary | ICD-10-CM | POA: Diagnosis not present

## 2016-02-22 DIAGNOSIS — Z01812 Encounter for preprocedural laboratory examination: Secondary | ICD-10-CM | POA: Insufficient documentation

## 2016-02-22 HISTORY — DX: Acute myocardial infarction, unspecified: I21.9

## 2016-02-22 LAB — BASIC METABOLIC PANEL
Anion gap: 9 (ref 5–15)
BUN: 11 mg/dL (ref 6–20)
CHLORIDE: 106 mmol/L (ref 101–111)
CO2: 24 mmol/L (ref 22–32)
CREATININE: 1 mg/dL (ref 0.61–1.24)
Calcium: 8.9 mg/dL (ref 8.9–10.3)
GFR calc non Af Amer: >60 mL/min (ref >60)
Glucose, Bld: 114 mg/dL — ABNORMAL HIGH (ref 65–99)
POTASSIUM: 4.2 mmol/L (ref 3.5–5.1)
SODIUM: 139 mmol/L (ref 135–145)

## 2016-02-22 LAB — PROTIME-INR
INR: 1.13
Prothrombin Time: 14.6 seconds (ref 11.4–15.2)

## 2016-02-22 LAB — CBC
HCT: 32.3 % — ABNORMAL LOW (ref 40.0–52.0)
Hemoglobin: 10.1 g/dL — ABNORMAL LOW (ref 13.0–18.0)
MCH: 23 pg — ABNORMAL LOW (ref 26.0–34.0)
MCHC: 31.3 g/dL — ABNORMAL LOW (ref 32.0–36.0)
MCV: 73.3 fL — AB (ref 80.0–100.0)
PLATELETS: 249 10*3/uL (ref 150–440)
RBC: 4.41 MIL/uL (ref 4.40–5.90)
RDW: 22.3 % — ABNORMAL HIGH (ref 11.5–14.5)
WBC: 5.1 10*3/uL (ref 3.8–10.6)

## 2016-02-22 LAB — APTT: aPTT: 33 seconds (ref 24–36)

## 2016-02-22 NOTE — Patient Instructions (Signed)
  Your procedure is scheduled [id number] 13, 2017 (Wednesday) Report to Same Day Surgery 2nd floor Medical Mall To find out your arrival time please call [phone] between 1PM - 3PM on February 26, 2016 (Tuesday)  Remember: Instructions that are not followed completely may result in serious medical risk, up to and including death, or upon the discretion of your surgeon and anesthesiologist your surgery may need to be rescheduled.    _x___ 1. Do not eat food or drink liquids after midnight. No gum chewing or hard candies.     _x___ 2. No Alcohol for 24 hours before or after surgery.   _x_ __3. No Smoking for 24 prior to surgery.   ____  4. Bring all medications with you on the day of surgery if instructed.    __x__ 5. Notify your doctor if there is any change in your medical condition     (cold, fever, infections).     Do not wear jewelry, make-up, hairpins, clips or nail polish.  Do not wear lotions, powders, or perfumes. You may wear deodorant.  Do not shave 48 hours prior to surgery. Men may shave face and neck.  Do not bring valuables to the hospital.    [facility] is not responsible for any belongings or valuables.               Contacts, dentures or bridgework may not be worn into surgery.  Leave your suitcase in the car. After surgery it may be brought to your room.  For patients admitted to the hospital, discharge time is determined by your treatment team.   Patients discharged the day of surgery will not be allowed to drive home.    Please read over the following fact sheets that you were given:   [facility] Preparing for Surgery and or MRSA Information   ____ Take these medicines the morning of surgery with A SIP OF WATER:    1.   2.  3.  4.  5.  6.  ____ Fleet Enema (as directed)   _x___ Use CHG Soap or sage wipes as directed on instruction sheet   ____ Use inhalers on the day of surgery and bring to hospital day of surgery  ____ Stop metformin 2  days prior to surgery    ____ Take 1/2 of usual insulin dose the night before surgery and none on the morning of surgery.             x____ Stop aspirin or coumadin, or plavix  (NO ASPIRIN)  _x__ Stop Anti-inflammatories such as Advil, Aleve, Ibuprofen, Motrin, Naproxen,          Naprosyn, Goodies powders or aspirin products. Ok to take Tylenol.   ____ Stop supplements until after surgery.    ____ Bring C-Pap to the hospital.

## 2016-02-23 LAB — URINE CULTURE

## 2016-02-23 NOTE — Pre-Procedure Instructions (Signed)
Faxed request to Dr. Carlynn Purl office for H&P.

## 2016-02-27 NOTE — Telephone Encounter (Signed)
Pt called stating he needs to r/s surgery d/t his mother had an accident & is unable to bring him to the hospital. Notified pt of surgery with Dr Pilar Jarvis r/s to 03/26/16, pre-admit testing phone interview on 03/19/16 between 9am-1pm & to call day prior to surgery for arrival time to [facility]. Pt voices understanding.

## 2016-03-19 ENCOUNTER — Encounter
Admission: RE | Admit: 2016-03-19 | Discharge: 2016-03-19 | Disposition: A | Discharge status: Home / Self Care | Payer: Self-pay | Source: Ambulatory Visit | Attending: Urology | Admitting: Urology

## 2016-03-19 NOTE — Pre-Procedure Instructions (Signed)
Frederick Perry  ECHO COMPLETE WO IMAGE ENHANCING AGENT  Order# [id number]  Reading physician: Minna Merritts, MD Ordering physician: Gladstone Lighter, MD Study date: 09/05/15  Study Result   Result status: Final result                   [facility]                       [place], [place] [address]                            [phone]  ------------------------------------------------------------------- Transthoracic Echocardiography  Patient:    Frederick Perry, Frederick Perry MR #:       [id number] Study Date: 09/05/2015 Gender:     M Age:        60 Height:     188 cm Weight:     100.1 kg BSA:        2.3 m^2 Pt. Status: Room:   SONOGRAPHER  Cindy Hazy, RDCS  ATTENDING    [place], Radhika  Ronaldo Miyamoto, Radhika  REFERRING    [place], [place]  PERFORMING   [facility]  cc:  ------------------------------------------------------------------- LV EF: 50% -   55%  ------------------------------------------------------------------- Indications:      R07.9 Chest Pain.  ------------------------------------------------------------------- History:   PMH:  Acquired from the patient and from the patient&'s chart.  Risk factors:  Alcohol Abuse. Current tobacco use. Hypertension. Dyslipidemia.  ------------------------------------------------------------------- Study Conclusions  - Left ventricle: The cavity size was normal. There was mild focal   basal hypertrophy of the septum. Systolic function was normal.   The estimated ejection fraction was in the range of 50% to 55%.   Hypokinesis of the lateral myocardium. Hypokinesis of the   inferior myocardium. Hypokinesis of the inferolateral myocardium.   Left ventricular diastolic function parameters were normal. - Mitral valve: There was mild to moderate regurgitation. - Left atrium: The atrium was mildly dilated. - Right ventricle: Systolic  function was normal. - Pulmonary arteries: Systolic pressure was mildly elevated. PA   peak pressure: 42 mm Hg (S).  Transthoracic echocardiography.  M-mode, complete 2D, spectral Doppler, and color Doppler.  Birthdate:  Patient birthdate: 1955/12/18.  Age:  Patient is 60 yr old.  Sex:  Gender: male. BMI: 28.3 kg/m^2.  Blood pressure:     130/82  Patient status: Inpatient.  Study date:  Study date: 09/05/2015. Study time: 06:26 PM.  Location:  Echo laboratory.  -------------------------------------------------------------------  ------------------------------------------------------------------- Left ventricle:  The cavity size was normal. There was mild focal basal hypertrophy of the septum. Systolic function was normal. The estimated ejection fraction was in the range of 50% to 55%. Regional wall motion abnormalities:   Hypokinesis of the lateral myocardium.  Hypokinesis of the inferior myocardium.  Hypokinesis of the inferolateral myocardium. The transmitral flow pattern was normal. The deceleration time of the early transmitral flow velocity was normal. The pulmonary vein flow pattern was normal. The tissue Doppler parameters were normal. Left ventricular diastolic function parameters were normal.  ------------------------------------------------------------------- Aortic valve:   Trileaflet; normal thickness, mildly calcified leaflets. Mobility was not restricted.  Doppler:  Transvalvular velocity was within the normal range. There was no stenosis. There was no regurgitation.  ------------------------------------------------------------------- Aorta:  Aortic root:  The aortic root was normal in size.  ------------------------------------------------------------------- Mitral valve:   Structurally normal valve.   Mobility was not restricted.  Doppler:  Transvalvular velocity was within the normal range. There was no evidence for stenosis. There was mild to moderate  regurgitation.    Peak gradient (D): 4 mm Hg.  ------------------------------------------------------------------- Left atrium:  The atrium was mildly dilated.  ------------------------------------------------------------------- Right ventricle:  The cavity size was normal. Wall thickness was normal. Systolic function was normal.  ------------------------------------------------------------------- Pulmonic valve:    Structurally normal valve.   Cusp separation was normal.  Doppler:  Transvalvular velocity was within the normal range. There was no evidence for stenosis. There was no regurgitation.  ------------------------------------------------------------------- Tricuspid valve:   Structurally normal valve.    Doppler: Transvalvular velocity was within the normal range. There was mild regurgitation.  ------------------------------------------------------------------- Pulmonary artery:   The main pulmonary artery was normal-sized. Systolic pressure was mildly elevated.  ------------------------------------------------------------------- Right atrium:  The atrium was normal in size.  ------------------------------------------------------------------- Pericardium:  There was no pericardial effusion.  ------------------------------------------------------------------- Systemic veins: Inferior vena cava: The vessel was normal in size.  ------------------------------------------------------------------- Measurements   Left ventricle                           Value        Reference  LV ID, ED, PLAX chordal                  45.1  mm     43 - 52  LV ID, ES, PLAX chordal                  32.4  mm     23 - 38  LV fx shortening, PLAX chordal (L)       28    %      >=29  LV PW thickness, ED                      11.9  mm     ---------  IVS/LV PW ratio, ED                      1.23         <=1.3  Stroke volume, 2D                        111   ml     ---------  Stroke  volume/bsa, 2D                    48    ml/m^2 ---------  LV e&', lateral                           8.7   cm/s   ---------  LV E/e&', lateral                         11.95        ---------  LV e&', medial                            10.8  cm/s   ---------  LV E/e&', medial  9.63         ---------  LV e&', average                           9.75  cm/s   ---------  LV E/e&', average                         10.67        ---------    Ventricular septum                       Value        Reference  IVS thickness, ED                        14.6  mm     ---------    LVOT                                     Value        Reference  LVOT ID, S                               24    mm     ---------  LVOT area                                4.52  cm^2   ---------  LVOT ID                                  24    mm     ---------  LVOT peak velocity, S                    154   cm/s   ---------  LVOT mean velocity, S                    106   cm/s   ---------  LVOT VTI, S                              24.6  cm     ---------  LVOT peak gradient, S                    9     mm Hg  ---------  Stroke volume (SV), LVOT DP              111.3 ml     ---------  Stroke index (SV/bsa), LVOT DP           48.3  ml/m^2 ---------    Aorta                                    Value        Reference  Aortic root ID, ED                       33    mm     ---------  Left atrium                              Value        Reference  LA ID, A-P, ES                           47    mm     ---------  LA ID/bsa, A-P                           2.04  cm/m^2 <=2.2  LA volume, S                             85.1  ml     ---------  LA volume/bsa, S                         37    ml/m^2 ---------  LA volume, ES, 1-p A4C                   76.1  ml     ---------  LA volume/bsa, ES, 1-p A4C               33    ml/m^2 ---------  LA volume, ES, 1-p A2C                   92    ml     ---------  LA volume/bsa, ES, 1-p A2C                39.9  ml/m^2 ---------    Mitral valve                             Value        Reference  Mitral E-wave peak velocity              104   cm/s   ---------  Mitral A-wave peak velocity              73.7  cm/s   ---------  Mitral deceleration time                 201   ms     150 - 230  Mitral peak gradient, D                  4     mm Hg  ---------  Mitral E/A ratio, peak                   1.4          ---------    Pulmonary arteries                       Value        Reference  PA pressure, S, DP             (H)       42    mm Hg  <=30    Right ventricle                          Value        Reference  TAPSE                                    22.4  mm     ---------  RV s&', lateral, S                        18.1  cm/s   ---------  Legend: (L)  and  (H)  mark values outside specified reference range.  ------------------------------------------------------------------- Prepared and Electronically Authenticated by  Esmond Plants, MD, [facility] 2017-03-23T12:21:39  PACS Images   Show images for ECHO COMPLETE  Patient Information   Patient Name Frederick Perry, Frederick Perry Sex Male DOB March 16, 1956 SSN [id number]  Reason For Exam  Priority: Routine  Chest Pain 786.50 / R07.9  Comments: [facility]  Surgical History   Surgical History   Procedure Laterality Date Comment Source  CARDIAC CATHETERIZATION N/A 09/05/2015 Procedure: Left Heart Cath and Coronary Angiography; Surgeon: Minna Merritts, MD; Location: [place] [facility]; Service: Cardiovascular; Laterality: N/A; Provider    Other Surgical History   Procedure Laterality Date Comment Source  BACK SURGERY    Provider  DRAIN REMOVAL Right 12/20/2015 Procedure: DRAIN REMOVAL; Surgeon: Nickie Retort, MD; Location: [facility]; Service: Urology; Laterality: Right; by dr copper Provider  LAPAROTOMY N/A 12/14/2015 Procedure: EXPLORATORY LAPAROTOMY, prepyloric gastric ulcer biopsy; Surgeon: Jules Husbands, MD;  Location: [facility]; Service: General; Laterality: N/A; Provider  Patellar tendon repair Right   Provider  PERIPHERAL VASCULAR CATHETERIZATION N/A 09/07/2015 Procedure: IVC Filter Insertion; Surgeon: Algernon Huxley, MD; Location: [place] [facility]; Service: Cardiovascular; Laterality: N/A; Provider  Testicular torsion repair    Provider  TRANSURETHRAL RESECTION OF BLADDER TUMOR N/A 12/20/2015 Procedure: TRANSURETHRAL RESECTION OF BLADDER TUMOR (TURBT); Surgeon: Nickie Retort, MD; Location: [facility]; Service: Urology; Laterality: N/A; Provider    Patient Data   Height 74 in    BP 135/81 mmHg       Performing Technologist/Nurse   Performing Technologist/Nurse: Natashia Rodgers-Jones                    Implants     No active implants to display in this view.  Order-Level Documents - 09/05/2015:   Scan on 09/09/2015 2:11 PM by Provider Default, MD      Encounter-Level Documents - 09/05/2015:   Scan on 09/09/2015 2:22 PM by Provider Default, MD  Scan on 09/09/2015 2:19 PM by Provider Default, MD  Scan on 09/09/2015 2:11 PM by Provider Default, MD  Scan on 09/09/2015 2:11 PM by Provider Default, MD  Scan on 09/09/2015 1:56 PM by Provider Default, MD  Scan on 09/08/2015 9:03 AM by Provider Default, MD  Scan on 09/05/2015 3:59 PM by Provider Default, MD  Electronic signature on 09/05/2015 7:57 AM      Signed   Electronically signed by Minna Merritts, MD on 09/06/15 at 1221 EDT  Printable Result Report   Result Report  ECHO COMPLETE (Order [id number])  Echocardiography  Date: 09/05/2015 Department: [place] (2A) Released By/Authorizing: Gladstone Lighter, MD (auto-released)  Procedure Abnormality Status  ECHO COMPLETE    Order Information   Order Date/Time Release Date/Time Start Date/Time End Date/Time  09/05/15 11:15 AM 09/05/15 11:15 AM 09/05/15 11:16 AM 09/05/15 11:16 AM  Order Details   Frequency Duration Priority Order  Class  Once 1 occurrence Routine Hospital Performed  Acc#  [id number]  Order Questions   Question Answer Comment  Complete or Limited study? Complete   With Image Enhancing Agent or without Image Enhancing Agent? With Image Enhancing Agent   Note:  Select Without Image Enhancing Agent only if contraindicated.  Reason for exam-Echo Chest Pain 786.50 / R07.9       Authorizing Provider [address]   Date/Time Authorizing Provider Changed by  09/05/2015 11:15 AM Gladstone Lighter, MD Gladstone Lighter, MD  Order Requisition   ECHO COMPLETE [phone]) on 09/05/15  Collection Information   Collected: 09/05/2015 6:26 PM   Resulting Agency: [facility]    View SmartLink Info   ECHO COMPLETE (Order [id number]) on 09/05/15

## 2016-03-19 NOTE — Patient Instructions (Signed)
  Your procedure is scheduled on: 03-26-16 Report to Same Day Surgery 2nd floor medical mall To find out your arrival time please call [phone] between 1PM - 3PM on 03-25-16  Remember: Instructions that are not followed completely may result in serious medical risk, up to and including death, or upon the discretion of your surgeon and anesthesiologist your surgery may need to be rescheduled.    _x___ 1. Do not eat food or drink liquids after midnight. No gum chewing or hard candies.     __x__ 2. No Alcohol for 24 hours before or after surgery.   __x__3. No Smoking for 24 prior to surgery.   ____  4. Bring all medications with you on the day of surgery if instructed.    __x__ 5. Notify your doctor if there is any change in your medical condition     (cold, fever, infections).     Do not wear jewelry, make-up, hairpins, clips or nail polish.  Do not wear lotions, powders, or perfumes. You may wear deodorant.  Do not shave 48 hours prior to surgery. Men may shave face and neck.  Do not bring valuables to the hospital.    [facility] is not responsible for any belongings or valuables.               Contacts, dentures or bridgework may not be worn into surgery.  Leave your suitcase in the car. After surgery it may be brought to your room.  For patients admitted to the hospital, discharge time is determined by your treatment team.   Patients discharged the day of surgery will not be allowed to drive home.    Please read over the following fact sheets that you were given:   [facility] Preparing for Surgery and or MRSA Information   ____ Take these medicines the morning of surgery with A SIP OF WATER:    1. NONE  2.  3.  4.  5.  6.  ____Fleets enema or Magnesium Citrate as directed.   ____ Use CHG Soap or sage wipes as directed on instruction sheet   ____ Use inhalers on the day of surgery and bring to hospital day of surgery  ____ Stop metformin 2 days prior to  surgery    ____ Take 1/2 of usual insulin dose the night before surgery and none on the morning of surgery.   ____ Stop aspirin or coumadin, or plavix  x__ Stop Anti-inflammatories such as Advil, Aleve, Ibuprofen, Motrin, Naproxen,          Naprosyn, Goodies powders or aspirin products NOW- Ok to take Tylenol.   ____ Stop supplements until after surgery.    ____ Bring C-Pap to the hospital.

## 2016-03-19 NOTE — Pre-Procedure Instructions (Signed)
Frederick Perry  Cardiac catheterization  Order# [id number]  Reading physician: Minna Merritts, MD Ordering physician: Rise Mu, PA-C Study date: 09/05/15  Physicians   Panel Physicians Referring Physician Case Authorizing Physician  Minna Merritts, MD (Primary)  Rise Mu, PA-C  Procedures   Left Heart Cath and Coronary Angiography  Indications   Ischemic chest pain ([place]) [I20.9 ([id number]  Complications   Complications documented in old activity   Cardiac Catheterization Procedure Note   Name: Frederick Perry  MRN: [id number]  DOB: [birth year]   Procedure: Left Heart Cath, Selective Coronary Angiography, LV angiography   Indication: 60 year old male with a long smoking history, diabetes type 2, hyperlipidemia, hypertension, history of alcohol abuse per the notes presenting with chest pain starting 3 days ago, presenting with non-STEMI, troponin of 20. Cardiac catheterization scheduled given his non-STEMI presentation   Procedural details: The right groin was prepped, draped, and anesthetized with 1% lidocaine. Using modified Seldinger technique, a 5 French sheath was introduced into the right femoral artery. Standard Judkins catheters (JL 5, JR 5 and pigtail catheter) were used for coronary angiography and left ventriculography. Catheter exchanges were performed over a guidewire. There were no immediate procedural complications. The patient was transferred to the post catheterization recovery area for further monitoring.   Moderate sedation:  1. Sedation used: 2 mg Versed, 25 g fentanyl  2. 53 minutes of sedation monitoring  3. I was Face to Face with the patient during this time: (code: [phone])    Procedural Findings:   Coronary angiography:  Coronary dominance: Right   Left mainstem:  Large vessel that bifurcates into the LAD and left circumflex, no significant disease noted   Left anterior descending (LAD):  Large vessel that extends to the apical region,  diagonal branch 2 of moderate size,  There is 50% mid LAD disease, long calcified region   Left circumflex (LCx): Large vessel with OM branch 2,  OM 2 is occluded in the proximal region  Distal OM 2 vessel fills via left to left collaterals   Right coronary artery (RCA): Right dominant vessel with PL and PDA,  PDA is occluded proximally, filled by left to right collaterals   Left ventriculography: Left ventricular systolic function is normal, LVEF is estimated at 45 to 50%, there is no significant mitral regurgitation , no significant aortic valve stenosis   Final Conclusions:   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 Fletcher Anon. Given profound anemia, patient currently not having chest pain, event likely happening more than 2 days ago (that is when chest pain symptoms presented), troponins already peaked, also OM 2 and you relatively small to moderate size vessel, recommended medical management    Recommendations:  Recommended smoking cessation  Medical management of his coronary disease  Recommend aspirin 81 mg daily and brilinta 90 mg BID   Timothy Gollan  09/05/2015, 4:54 PM   Estimated blood loss <50 mL. There were no immediate complications during the procedure.      Coronary Findings   Dominance: Co-dominant  Wall Motion              Coronary Diagrams   Diagnostic Diagram     Implants     Vascular Products  Device Closure Mynxgrip 76f [id number] - Implanted    Inventory item: Device Closure Mynxgrip 77f Model/Cat number: [id number]  Manufacturer: [employer]  INC Lot number: [id number]  Device identifier: [id number] Device identifier type: HIBC  Area Of Implantation: Groin    As of 09/05/2015   Status: Implanted      PACS Images   Show images for Cardiac catheterization   Link to Procedure Log   Procedure Log    Hemo Data   AO  Systolic Cath Pressure AO Diastolic Cath Pressure AO Mean Cath Pressure LV Systolic Cath Pressure LV End Diastolic  99991111 72 mmHg 95 mmHg -- --  104 68 mmHg 84 mmHg -- --  111 72 mmHg 83 mmHg -- --  106 69 mmHg 84 mmHg -- --  -- -- -- 111 mmHg 22 mmHg  -- -- -- 108 mmHg 21 mmHg  -- -- -- 108 mmHg 21 mmHg  113 68 mmHg 87 mmHg -- --  Order-Level Documents - 09/05/2015:   Scan on 09/09/2015 2:11 PM by Provider Default, MD      Encounter-Level Documents - 09/05/2015:   Scan on 09/09/2015 2:22 PM by Provider Default, MD  Scan on 09/09/2015 2:19 PM by Provider Default, MD  Scan on 09/09/2015 2:11 PM by Provider Default, MD  Scan on 09/09/2015 2:11 PM by Provider Default, MD  Scan on 09/09/2015 1:56 PM by Provider Default, MD  Scan on 09/08/2015 9:03 AM by Provider Default, MD  Scan on 09/05/2015 3:59 PM by Provider Default, MD  Electronic signature on 09/05/2015 7:57 AM      Signed   Electronically signed by Minna Merritts, MD on 09/05/15 at 1700 EDT  External Result Report   External Result Report  Cardiac catheterization (Order [id number])  Cardiac Cath  Date: 09/05/2015 Department: [place] (2A) Released By: Lorre Munroe, RN Authorizing: Rise Mu, PA-C  Order Information   Order Date/Time Release Date/Time Start Date/Time End Date/Time  09/05/15 02:43 PM 09/05/15 02:43 PM 09/05/15 02:44 PM 09/05/15 02:44 PM  Order Details   Frequency Duration Priority Order Class  Once 1 occurrence Routine Hospital Performed  Acc#  [id number]  Associated Diagnoses    ICD-9-CM ICD-10-CM  Ischemic chest pain [facility])    786.50 I20.9  Authorizing Provider Audit Trail   Date/Time Authorizing Provider Changed by  09/05/2015 2:43 PM Rise Mu, PA-C Lorre Munroe, RN  Order Requisition   Cardiac catheterization (Order [id number]) on 09/05/15  Collection Information   View SmartLink Info   Cardiac catheterization (Order [id number]) on 09/05/15

## 2016-03-25 DIAGNOSIS — C678 Malignant neoplasm of overlapping sites of bladder: Secondary | ICD-10-CM

## 2016-03-25 NOTE — Pre-Procedure Instructions (Signed)
Progress Notes Date of Service: 09/06/2015 9:42 AM Minna Merritts, MD  Cardiology  Expand All Collapse All      Patient: Frederick Perry / Admit Date: 09/05/2015 / Date of Encounter: 09/06/2015, 9:42 AM   Subjective: Feels well as morning, no complaints Does report having dark urine for the past day or so No abdominal symptoms Maintaining normal sinus rhythm  Review of Systems: Review of Systems  Constitutional: Negative.   Respiratory: Negative.   Cardiovascular: Negative.   Gastrointestinal: Negative.   Musculoskeletal: Negative.   Neurological: Negative.   Psychiatric/Behavioral: Negative.   All other systems reviewed and are negative.   Objective: Telemetry:  Physical Exam: Blood pressure 111/77, pulse 95, temperature 99.4 F (37.4 C), temperature source Oral, resp. rate 18, height 6\' 2"  (1.88 m), weight 220 lb 10.9 oz (100.1 kg), SpO2 100 %. Body mass index is 28.32 kg/(m^2). General: Well developed, well nourished, in no acute distress. Head: Normocephalic, atraumatic, sclera non-icteric, no xanthomas, nares are without discharge. Neck: Negative for carotid bruits. JVP not elevated. Lungs: Clear bilaterally to auscultation without wheezes, rales, or rhonchi. Breathing is unlabored. Heart: RRR S1 S2 without murmurs, rubs, or gallops.  Abdomen: Soft, non-tender, non-distended with normoactive bowel sounds. No rebound/guarding. Extremities: No clubbing or cyanosis. No edema. Distal pedal pulses are 2+ and equal bilaterally. Neuro: Alert and oriented X 3. Moves all extremities spontaneously. Psych:  Responds to questions appropriately with a normal affect.   Intake/Output Summary (Last 24 hours) at 09/06/15 0942 Last data filed at 09/06/15 0941  Gross per 24 hour  Intake   1656 ml  Output    800 ml  Net    856 ml    Inpatient Medications:  . aspirin  81 mg Oral Daily  . atorvastatin  40 mg Oral q1800  . carvedilol  3.125 mg Oral BID WC  . iohexol   25 mL Oral Q1 Hr x 2  . sodium chloride flush  3 mL Intravenous Q12H  . ticagrelor  90 mg Oral BID   Infusions:    Labs:  RecentLabs(last2labs)   Recent Labs  09/05/15 0735 09/06/15 0322  NA 134* 131*  K 3.8 3.7  CL 102 103  CO2 23 24  GLUCOSE 193* 147*  BUN 14 13  CREATININE 1.13 1.03  CALCIUM 8.4* 8.1*      RecentLabs(last2labs)   Recent Labs  09/05/15 0735  AST 176*  ALT 24  ALKPHOS 70  BILITOT 1.0  PROT 7.3  ALBUMIN 3.4*      RecentLabs(last2labs)   Recent Labs  09/05/15 1153 09/05/15 1744 09/06/15 0322  WBC 13.7* 17.3* 17.6*  NEUTROABS 10.7*  --   --   HGB 6.9* 8.1* 6.8*  HCT 23.5* 27.6* 22.8*  MCV 70.8* 71.0* 69.7*  PLT 253 294 262      RecentLabs(last2labs)   Recent Labs  09/05/15 0735 09/05/15 1153 09/05/15 1730 09/05/15 2307  TROPONINI 20.13* 20.31* 22.51* 16.10*     RecentLabs(last2labs)  Invalid input(s): POCBNP    RecentLabs(last2labs)   Recent Labs  09/05/15 1730  HGBA1C 7.4*       Weights:    Filed Weights   09/05/15 0732  Weight: 220 lb 10.9 oz (100.1 kg)     Radiology/Studies:   ImagingResults  Dg Chest Port 1 View  09/05/2015  CLINICAL DATA:  Chest pain since yesterday. Swelling of the ankles. Hypertension. EXAM: PORTABLE CHEST 1 VIEW COMPARISON:  05/15/2004 FINDINGS: Artifact overlies chest. Heart size is at the  upper limits of normal. Mediastinal shadows are normal. The lungs are clear. The vascularity is normal. No effusions. No bony abnormalities. IMPRESSION: No active disease. Electronically Signed   By: Nelson Chimes M.D.   On: 09/05/2015 07:53      Assessment and Plan  60 y.o. male   1. NSTEMI/possible posterior MI: -Initial troponin of 20 in the setting of hgb 4.9  cardiac catheterization yesterday  Occluded OM 2 vessel in the proximal region Occluded PDA Both vessels filled via collaterals Also with moderate mid LAD  disease ----Given profound anemia, patient currently not having chest pain, event likely happened 2 days ago (that is when chest pain symptoms presented), troponins already peaked, also OM 2 relatively small to moderate size vessel,  recommend medical management --- He was started on antiplatelet therapy, now with hematuria May need to stop brilinta  2. Acute on possible chronic microcytic anemia: -Status post transfusion of 2 units pRBC to date Receiving more blood this morning Seen by hematology ----Hematuria noted this morning Plan for CT scan of abdomen and pelvis  3. DM2: -30 pound weight loss in 12 months Management per medicine service  4. HTN: -Controlled   Signed, Esmond Plants, MD, Ph.D. [facility] 09/06/2015, 9:42 AM      Electronically signed by Minna Merritts, MD at 09/06/2015 9:47 AM      ED to Hosp-Admission (Discharged) on 09/05/2015        Detailed Report

## 2016-03-26 ENCOUNTER — Observation Stay
Admission: RE | Admit: 2016-03-26 | Discharge: 2016-03-27 | Disposition: A | Discharge status: Home / Self Care | Payer: Medicaid Other | Source: Ambulatory Visit | Attending: Urology | Admitting: Urology

## 2016-03-26 ENCOUNTER — Encounter
Admission: RE | Disposition: A | Discharge status: Home / Self Care | Payer: Self-pay | Source: Ambulatory Visit | Attending: Urology

## 2016-03-26 ENCOUNTER — Encounter: Payer: Self-pay | Admitting: *Deleted

## 2016-03-26 ENCOUNTER — Ambulatory Visit: Payer: Medicaid Other | Admitting: Certified Registered Nurse Anesthetist

## 2016-03-26 ENCOUNTER — Telehealth: Payer: Self-pay

## 2016-03-26 DIAGNOSIS — I10 Essential (primary) hypertension: Secondary | ICD-10-CM | POA: Insufficient documentation

## 2016-03-26 DIAGNOSIS — F17210 Nicotine dependence, cigarettes, uncomplicated: Secondary | ICD-10-CM | POA: Insufficient documentation

## 2016-03-26 DIAGNOSIS — I214 Non-ST elevation (NSTEMI) myocardial infarction: Secondary | ICD-10-CM | POA: Diagnosis not present

## 2016-03-26 DIAGNOSIS — C679 Malignant neoplasm of bladder, unspecified: Secondary | ICD-10-CM | POA: Diagnosis present

## 2016-03-26 DIAGNOSIS — E119 Type 2 diabetes mellitus without complications: Secondary | ICD-10-CM | POA: Diagnosis not present

## 2016-03-26 DIAGNOSIS — E785 Hyperlipidemia, unspecified: Secondary | ICD-10-CM | POA: Diagnosis not present

## 2016-03-26 HISTORY — PX: TRANSURETHRAL RESECTION OF BLADDER TUMOR: SHX2575

## 2016-03-26 LAB — GLUCOSE, CAPILLARY
GLUCOSE-CAPILLARY: 155 mg/dL — AB (ref 65–99)
Glucose-Capillary: 152 mg/dL — ABNORMAL HIGH (ref 65–99)

## 2016-03-26 SURGERY — TURBT (TRANSURETHRAL RESECTION OF BLADDER TUMOR)
Anesthesia: General | Wound class: Clean Contaminated

## 2016-03-26 MED ORDER — MORPHINE SULFATE (PF) 2 MG/ML IV SOLN: 2.0000 mg | INTRAVENOUS | Status: DC | PRN

## 2016-03-26 MED ORDER — CEFAZOLIN SODIUM-DEXTROSE 2-4 GM/100ML-% IV SOLN
2.0000 g | Freq: Three times a day (TID) | INTRAVENOUS | Status: AC
  Administered 2016-03-26 – 2016-03-27 (×3): 2 g via INTRAVENOUS
  Filled 2016-03-26 (×3): qty 100

## 2016-03-26 MED ORDER — CEFAZOLIN SODIUM-DEXTROSE 2-4 GM/100ML-% IV SOLN
2.0000 g | INTRAVENOUS | Status: AC
  Administered 2016-03-26: 2 g via INTRAVENOUS

## 2016-03-26 MED ORDER — ONDANSETRON HCL 4 MG/2ML IJ SOLN: 4.0000 mg | Freq: Once | INTRAMUSCULAR | Status: DC | PRN

## 2016-03-26 MED ORDER — FENTANYL CITRATE (PF) 100 MCG/2ML IJ SOLN
INTRAMUSCULAR | Status: AC
  Administered 2016-03-26: 25 ug via INTRAVENOUS
  Filled 2016-03-26: qty 2

## 2016-03-26 MED ORDER — PROPOFOL 10 MG/ML IV BOLUS
INTRAVENOUS | Status: DC | PRN
  Administered 2016-03-26: 200 mg via INTRAVENOUS

## 2016-03-26 MED ORDER — OXYBUTYNIN CHLORIDE 5 MG PO TABS
5.0000 mg | ORAL_TABLET | Freq: Three times a day (TID) | ORAL | Status: DC | PRN
  Administered 2016-03-26: 5 mg via ORAL
  Filled 2016-03-26: qty 1

## 2016-03-26 MED ORDER — DOCUSATE SODIUM 100 MG PO CAPS
100.0000 mg | ORAL_CAPSULE | Freq: Two times a day (BID) | ORAL | Status: DC
  Administered 2016-03-26: 100 mg via ORAL
  Filled 2016-03-26: qty 1

## 2016-03-26 MED ORDER — HEPARIN SODIUM (PORCINE) 5000 UNIT/ML IJ SOLN
5000.0000 [IU] | Freq: Three times a day (TID) | INTRAMUSCULAR | Status: DC
  Administered 2016-03-26 – 2016-03-27 (×2): 5000 [IU] via SUBCUTANEOUS
  Filled 2016-03-26 (×2): qty 1

## 2016-03-26 MED ORDER — CEPHALEXIN 500 MG PO CAPS: 500.0000 mg | ORAL_CAPSULE | Freq: Three times a day (TID) | ORAL | 0 refills | Status: DC

## 2016-03-26 MED ORDER — ONDANSETRON HCL 4 MG/2ML IJ SOLN: 4.0000 mg | INTRAMUSCULAR | Status: DC | PRN

## 2016-03-26 MED ORDER — SODIUM CHLORIDE 0.9 % IV SOLN
INTRAVENOUS | Status: DC
  Administered 2016-03-26 – 2016-03-27 (×2): via INTRAVENOUS

## 2016-03-26 MED ORDER — LIDOCAINE HCL (CARDIAC) 20 MG/ML IV SOLN
INTRAVENOUS | Status: DC | PRN
  Administered 2016-03-26: 50 mg via INTRAVENOUS

## 2016-03-26 MED ORDER — INFLUENZA VAC SPLIT QUAD 0.5 ML IM SUSY: 0.5000 mL | PREFILLED_SYRINGE | INTRAMUSCULAR | Status: DC

## 2016-03-26 MED ORDER — HYDROCODONE-ACETAMINOPHEN 5-325 MG PO TABS: 1.0000 | ORAL_TABLET | ORAL | 0 refills | Status: DC | PRN

## 2016-03-26 MED ORDER — ONDANSETRON HCL 4 MG/2ML IJ SOLN
INTRAMUSCULAR | Status: DC | PRN
Start: 2016-03-26 — End: 2016-03-26
  Administered 2016-03-26: 4 mg via INTRAVENOUS

## 2016-03-26 MED ORDER — FAMOTIDINE 20 MG PO TABS
20.0000 mg | ORAL_TABLET | Freq: Once | ORAL | Status: AC
  Administered 2016-03-26: 20 mg via ORAL

## 2016-03-26 MED ORDER — LABETALOL HCL 5 MG/ML IV SOLN
INTRAVENOUS | Status: AC
Start: 2016-03-26 — End: 2016-03-26
  Filled 2016-03-26: qty 4

## 2016-03-26 MED ORDER — CEFAZOLIN SODIUM-DEXTROSE 2-4 GM/100ML-% IV SOLN
INTRAVENOUS | Status: AC
  Filled 2016-03-26: qty 100

## 2016-03-26 MED ORDER — LABETALOL HCL 5 MG/ML IV SOLN
10.0000 mg | Freq: Once | INTRAVENOUS | Status: AC
  Administered 2016-03-26: 10 mg via INTRAVENOUS

## 2016-03-26 MED ORDER — FENTANYL CITRATE (PF) 100 MCG/2ML IJ SOLN
25.0000 ug | INTRAMUSCULAR | Status: DC | PRN
  Administered 2016-03-26 (×4): 25 ug via INTRAVENOUS

## 2016-03-26 MED ORDER — LACTATED RINGERS IV SOLN
INTRAVENOUS | Status: DC | PRN
Start: 2016-03-26 — End: 2016-03-26
  Administered 2016-03-26: 10:00:00 via INTRAVENOUS

## 2016-03-26 MED ORDER — SODIUM CHLORIDE 0.9 % IV SOLN
INTRAVENOUS | Status: DC
  Administered 2016-03-26: 09:00:00 via INTRAVENOUS

## 2016-03-26 MED ORDER — FENTANYL CITRATE (PF) 100 MCG/2ML IJ SOLN
INTRAMUSCULAR | Status: DC | PRN
  Administered 2016-03-26 (×2): 50 ug via INTRAVENOUS

## 2016-03-26 MED ORDER — HEPARIN SODIUM (PORCINE) 5000 UNIT/ML IJ SOLN: 5000.0000 [IU] | Freq: Three times a day (TID) | INTRAMUSCULAR | Status: DC

## 2016-03-26 MED ORDER — MIDAZOLAM HCL 2 MG/2ML IJ SOLN
INTRAMUSCULAR | Status: DC | PRN
  Administered 2016-03-26: 2 mg via INTRAVENOUS

## 2016-03-26 MED ORDER — FAMOTIDINE 20 MG PO TABS
ORAL_TABLET | ORAL | Status: AC
  Filled 2016-03-26: qty 1

## 2016-03-26 MED ORDER — HYDROCODONE-ACETAMINOPHEN 5-325 MG PO TABS
1.0000 | ORAL_TABLET | ORAL | Status: DC | PRN
  Administered 2016-03-26 – 2016-03-27 (×2): 1 via ORAL
  Filled 2016-03-26 (×2): qty 1

## 2016-03-26 MED ORDER — DEXAMETHASONE SODIUM PHOSPHATE 10 MG/ML IJ SOLN
INTRAMUSCULAR | Status: DC | PRN
  Administered 2016-03-26: 4 mg via INTRAVENOUS

## 2016-03-26 SURGICAL SUPPLY — 30 items
BACTOSHIELD CHG 4% 4OZ (MISCELLANEOUS) ×1
BAG DRAIN CYSTO-URO LG1000N (MISCELLANEOUS) ×2 IMPLANT
BAG URO DRAIN 2000ML W/SPOUT (MISCELLANEOUS) ×1 IMPLANT
CATH FOL LEG HOLDER (MISCELLANEOUS) ×1 IMPLANT
CATH FOLEY 2WAY  5CC 16FR (CATHETERS)
CATH FOLEY 2WAY 5CC 16FR (CATHETERS)
CATH FOLEY 3WAY 30CC 22FR (CATHETERS) ×1 IMPLANT
CATH FOLEY 3WAY 30CC 24FR (CATHETERS)
CATH URTH 16FR FL 2W BLN LF (CATHETERS) IMPLANT
CATH URTH STD 24FR FL 3W 2 (CATHETERS) IMPLANT
ELECT LOOP 22F BIPOLAR SML (ELECTROSURGICAL)
ELECT REM PT RETURN 9FT ADLT (ELECTROSURGICAL)
ELECTRODE LOOP 22F BIPOLAR SML (ELECTROSURGICAL) ×1 IMPLANT
ELECTRODE REM PT RTRN 9FT ADLT (ELECTROSURGICAL) ×1 IMPLANT
EVACUATOR ELLICK (MISCELLANEOUS) ×1 IMPLANT
GLOVE BIO SURGEON STRL SZ7.5 (GLOVE) ×2 IMPLANT
GOWN STRL REUS W/ TWL LRG LVL3 (GOWN DISPOSABLE) ×2 IMPLANT
GOWN STRL REUS W/TWL LRG LVL3 (GOWN DISPOSABLE) ×4
KIT RM TURNOVER CYSTO AR (KITS) ×2 IMPLANT
LOOP CUT BIPOLAR 24F LRG (ELECTROSURGICAL) ×1 IMPLANT
PACK CYSTO AR (MISCELLANEOUS) ×2 IMPLANT
PLUG CATH AND CAP STER (CATHETERS) ×1 IMPLANT
SCRUB CHG 4% DYNA-HEX 4OZ (MISCELLANEOUS) ×1 IMPLANT
SET IRRIG Y TYPE TUR BLADDER L (SET/KITS/TRAYS/PACK) ×2 IMPLANT
SOL .9 NS 3000ML IRR  AL (IV SOLUTION) ×2
SOL .9 NS 3000ML IRR AL (IV SOLUTION) ×2
SOL .9 NS 3000ML IRR UROMATIC (IV SOLUTION) ×2 IMPLANT
SURGILUBE 2OZ TUBE FLIPTOP (MISCELLANEOUS) ×2 IMPLANT
SYRINGE IRR TOOMEY STRL 70CC (SYRINGE) ×1 IMPLANT
WATER STERILE IRR 1000ML POUR (IV SOLUTION) ×2 IMPLANT

## 2016-03-26 NOTE — Telephone Encounter (Signed)
-----   Message from Nickie Retort, MD sent at 03/26/2016 11:09 AM EDT ----- Needs an appt in a week for foley removal and to discuss pathology. Ok to [place] around lunch time.

## 2016-03-26 NOTE — Anesthesia Procedure Notes (Signed)
Procedure Name: LMA Insertion Date/Time: 03/26/2016 10:04 AM Performed by: Darlyne Russian Pre-anesthesia Checklist: Patient identified, Emergency Drugs available, Suction available and Patient being monitored Patient Re-evaluated:Patient Re-evaluated prior to inductionOxygen Delivery Method: Circle system utilized Preoxygenation: Pre-oxygenation with 100% oxygen Intubation Type: IV induction Ventilation: Mask ventilation without difficulty LMA: LMA inserted LMA Size: 4.0 Number of attempts: 1 Tube secured with: Tape Dental Injury: Teeth and Oropharynx as per pre-operative assessment

## 2016-03-26 NOTE — Progress Notes (Signed)
Irrigated cath for [employer] Rx of bloody urine with few clots return of only 50cc

## 2016-03-26 NOTE — Anesthesia Preprocedure Evaluation (Signed)
Anesthesia Evaluation  Patient identified by MRN, date of birth, ID band Patient awake    Reviewed: Allergy & Precautions, NPO status , Patient's Chart, lab work & pertinent test results  Airway Mallampati: III       Dental  (+) Teeth Intact   Pulmonary COPD, Current Smoker,     + decreased breath sounds      Cardiovascular hypertension, Pt. on medications + Past MI   Rhythm:Regular Rate:Normal     Neuro/Psych negative neurological ROS     GI/Hepatic negative GI ROS, Neg liver ROS,   Endo/Other  diabetes, Type 2  Renal/GU negative Renal ROS     Musculoskeletal   Abdominal (+) + obese,   Peds  Hematology  (+) anemia ,   Anesthesia Other Findings   Reproductive/Obstetrics                             Anesthesia Physical Anesthesia Plan  ASA: III  Anesthesia Plan: General   Post-op Pain Management:    Induction: Intravenous  Airway Management Planned: LMA  Additional Equipment:   Intra-op Plan:   Post-operative Plan: Extubation in OR  Informed Consent: I have reviewed the patients History and Physical, chart, labs and discussed the procedure including the risks, benefits and alternatives for the proposed anesthesia with the patient or authorized representative who has indicated his/her understanding and acceptance.     Plan Discussed with: CRNA  Anesthesia Plan Comments:         Anesthesia Quick Evaluation

## 2016-03-26 NOTE — Op Note (Signed)
Date of procedure: 03/26/16  Preoperative diagnosis:  1. Bladder cancer   Postoperative diagnosis:  1. Bladder cancer   Procedure: 1. Transurethral resection of bladder tumor 10 cm  Surgeon: Baruch Gouty, MD  Anesthesia: General  Complications: None  Intraoperative findings: The patient still had significant amount of tumor burden after previous resection. Tumor along the right lateral wall as well as the dome of the bladder. The tumor in the right lateral wall was mostly removed. Due to bladder thinning from multiple resections, decision was made to abort the procedure after removing the right lateral wall tumor with a plan to return at a later date to remove the tumor at the dome of the bladder.  EBL: None  Specimens: Bladder tumor to pathology  Drains: 69 French Foley catheter-3 way to dependent drainage. Third port plugged.  Disposition: Stable to the postanesthesia care unit  Indication for procedure: The patient is a 60 y.o. 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 A specimen though was 95% low-grade tumor. He presents today for repeat resection to remove more of his tumor..  After reviewing the management options for treatment, the patient elected to proceed with the above surgical procedure(s). We have discussed the potential benefits and risks of the procedure, side effects of the proposed treatment, the likelihood of the patient achieving the goals of the procedure, and any potential problems that might occur during the procedure or recuperation. Informed consent has been obtained.  Description of procedure: The patient was met in the preoperative area. All risks, benefits, and indications of the procedure were described in great detail. The patient consented to the procedure. Preoperative antibiotics were given. The patient was taken to the operative theater. General anesthesia was induced per the anesthesia service. The patient was then  placed in the dorsal lithotomy position and prepped and draped in the usual sterile fashion. A preoperative timeout was called.   A 24 French resectoscope was inserted with the visual obturator. Pan cystoscopy then revealed residual tumor burden and there was a very large size in size. It was approximately 20 cm. This was on the right lateral wall and dome of the bladder. Approximately 10 cm of this tumor was removed from the right lateral ball working. This took over 1 hour to remove. At this point due to such a significant large amount of remaining tumor decision was made to end the surgery due to bladder wall being thinner multiple places at this point. Is felt less decompressed bladder and return at a later date for repeat resection. Hemostasis was obtained and was excellent. The Ellik evacuator was used to back with the tumor which was sent for pathology. Pan cystoscopy revealed no areas of large bleeding. A 22 French Foley catheter that was 3-way incise the third port plugged was placed into the patient's bladder. It was irrigated with light pink drainage. The patient was then woken from anesthesia and transferred in stable condition to the postanesthesia care unit.  Plan: Assuming the patient's urine stays light pink in the PACU, he will be discharged home and follow up next week for Foley catheter removal and to discuss pathology. We will plan for repeat resection down the road.  Baruch Gouty, M.D.

## 2016-03-26 NOTE — Anesthesia Postprocedure Evaluation (Signed)
Anesthesia Post Note  Patient: Frederick Perry  Procedure(s) Performed: Procedure(s) (LRB): TRANSURETHRAL RESECTION OF BLADDER TUMOR (TURBT) (N/A)  Patient location during evaluation: PACU Anesthesia Type: General Level of consciousness: awake Pain management: pain level controlled Vital Signs Assessment: post-procedure vital signs reviewed and stable Respiratory status: spontaneous breathing Cardiovascular status: stable Anesthetic complications: no    Last Vitals:  Vitals:   03/26/16 1127 03/26/16 1200  BP: (!) 154/86 131/86  Pulse: 75 68  Resp: 16 13  Temp:      Last Pain:  Vitals:   03/26/16 0851  TempSrc: Oral                 VAN STAVEREN,Gennavieve Huq

## 2016-03-26 NOTE — H&P (Signed)
Frederick Perry 02/02/1956 [id number]  Referring provider: No referring provider defined for this encounter.      Chief Complaint  Patient presents with  . Follow-up    pathology, cath removal     HPI: The patient presents for postoperative follow-up after undergoing an incomplete resection of a 10 cm bladder tumor. Presents today for Foley removal and to discuss pathology. 30, his pathology was febrile with TA disease. It was technically high-grade in origin but only 5% of the specimen had high-grade appearance. 95% of the tumor was low-grade pTA.     PMH:      Past Medical History  Diagnosis Date  . Polysubstance abuse     a. ongoing tobacco and alcohol abuse   . Hypertension     Not on medications.stopped since he ran out of medications  . Diabetes mellitus without complication [facility])     Not on medications    Surgical History:       Past Surgical History  Procedure Laterality Date  . Back surgery    . Patellar tendon repair    . Testicular torsion repair    . Cardiac catheterization N/A 09/05/2015    Procedure: Left Heart Cath and Coronary Angiography;  Surgeon: Minna Merritts, MD;  Location: [place] [facility];  Service: Cardiovascular;  Laterality: N/A;  . Peripheral vascular catheterization N/A 09/07/2015    Procedure: IVC Filter Insertion;  Surgeon: Algernon Huxley, MD;  Location: [place] [facility];  Service: Cardiovascular;  Laterality: N/A;  . Laparotomy N/A 12/14/2015    Procedure: EXPLORATORY LAPAROTOMY, prepyloric gastric ulcer biopsy;  Surgeon: Jules Husbands, MD;  Location: [facility] ORS;  Service: General;  Laterality: N/A;  . Transurethral resection of bladder tumor N/A 12/20/2015    Procedure: TRANSURETHRAL RESECTION OF BLADDER TUMOR (TURBT);  Surgeon: Nickie Retort, MD;  Location: [facility] ORS;  Service: Urology;  Laterality: N/A;  . Drain removal Right 12/20/2015    Procedure: DRAIN REMOVAL;  Surgeon: Nickie Retort, MD;  Location: [facility] ORS;  Service: Urology;  Laterality: Right;  by dr copper    Home Medications:        Medication List       This list is accurate as of: 01/04/16 12:21 PM.  Always use your most recent med list.                atorvastatin 40 MG tablet  Commonly known as:  LIPITOR  Take 1 tablet (40 mg total) by mouth Frederick at 6 PM.     carvedilol 3.125 MG tablet  Commonly known as:  COREG  Take 1 tablet (3.125 mg total) by mouth 2 (two) times Frederick with a meal.     cephALEXin 500 MG capsule  Commonly known as:  KEFLEX  Take 1 capsule (500 mg total) by mouth 4 (four) times Frederick.     famotidine 40 MG tablet  Commonly known as:  PEPCID  Take 1 tablet (40 mg total) by mouth Frederick.     Hydrocodone-Acetaminophen 5-300 MG Tabs  Commonly known as:  VICODIN  Take 1 tablet by mouth every 4 (four) hours as needed.        Allergies: No Known Allergies  Family History:      Family History  Problem Relation Age of Onset  . Congestive Heart Failure Mother   . Peripheral vascular disease Father     Social History:  reports that he has been smoking Cigarettes.  He  has been smoking about 1.00 pack per day. He does not have any smokeless tobacco history on file. He reports that he drinks about 12.6 oz of alcohol per week. He reports that he does not use illicit drugs.  ROS:                                        Physical Exam: BP 118/81 mmHg  Pulse 108  Ht 6\' 2"  (1.88 m)  Wt 205 lb 3.2 oz (93.078 kg)  BMI 26.33 kg/m2  Constitutional:  Alert and oriented, No acute distress. HEENT: [place] AT, moist mucus membranes.  Trachea midline, no masses. Cardiovascular: No clubbing, cyanosis, or edema.  RRR. Respiratory: Normal respiratory effort, no increased work of breathing. GI: Abdomen is soft, nontender, nondistended, no abdominal masses GU: No CVA tenderness.  Skin: No rashes, bruises or suspicious  lesions. Lymph: No cervical or inguinal adenopathy. Neurologic: Grossly intact, no focal deficits, moving all 4 extremities. Psychiatric: Normal mood and affect.  Laboratory Data: RecentLabs       Lab Results  Component Value Date   WBC 12.2* 12/20/2015   HGB 9.1* 12/20/2015   HCT 26.8* 12/20/2015   MCV 86.8 12/20/2015   PLT 167 12/20/2015      RecentLabs       Lab Results  Component Value Date   CREATININE 0.79 12/20/2015      RecentLabs  No results found for: PSA    RecentLabs  No results found for: TESTOSTERONE    RecentLabs       Lab Results  Component Value Date   HGBA1C 7.4* 09/05/2015      Urinalysis Labs(Brief)          Component Value Date/Time   COLORURINE YELLOW* 09/05/2015 1915   APPEARANCEUR CLOUDY* 09/05/2015 1915   LABSPEC 1.031* 09/05/2015 1915   PHURINE 9.0* 09/05/2015 1915   GLUCOSEU NEGATIVE 09/05/2015 1915   HGBUR 3+* 09/05/2015 1915   BILIRUBINUR NEGATIVE 09/05/2015 1915   KETONESUR NEGATIVE 09/05/2015 1915   PROTEINUR 100* 09/05/2015 1915   NITRITE POSITIVE* 09/05/2015 [place] 09/05/2015 1915       Assessment & Plan:   1. Bladder Cancer I went over with the patient has relatively favorable pathology results. He still needs repeat TURBT to finish the resection. He has has a risk, benefits, indications of the procedure. He understands the risks include but are not limited to bleeding, infection, bladder perforation, injury to surrounding structures. He did have bladder spasms at his last procedure so I gave him 3 weeks worth of Myrbetriq 25 mg 4 when he is discharged home with a Foley catheter after his next procedure. All questions were answered. The patient elected to proceed.   Nickie Retort, MD  [facility] [address], [place], [place] [address] [phone]

## 2016-03-26 NOTE — Care Management (Signed)
Patient admitted post op TURP.  Patient has been assessed by this RNCM previously.  Patient lives at home with his mother and provides care for her.  Patient in no employed, and is uninsured.  Patient has been provided with [facility] and [facility] applications x2.  Previous admission patient stated that "he had not gotten around to filling out the form".  Previous admission patient discharge prescriptions were filled at Medication management.  Patient currently has CBI.  RNCM following for discharge needs.

## 2016-03-26 NOTE — Transfer of Care (Signed)
Immediate Anesthesia Transfer of Care Note  Patient: Frederick Perry  Procedure(s) Performed: Procedure(s): TRANSURETHRAL RESECTION OF BLADDER TUMOR (TURBT) (N/A)  Patient Location: PACU  Anesthesia Type:General  Level of Consciousness: awake, alert , oriented and patient cooperative  Airway & Oxygen Therapy: Patient Spontanous Breathing and Patient connected to nasal cannula oxygen  Post-op Assessment: Report given to RN and Post -op Vital signs reviewed and stable  Post vital signs: Reviewed and stable  Last Vitals:  Vitals:   03/26/16 0851 03/26/16 1105  BP: (!) 152/88 (!) 147/98  Pulse: 89 81  Resp: 16 15  Temp: 36.7 C 36.3 C    Last Pain:  Vitals:   03/26/16 0851  TempSrc: Oral         Complications: No apparent anesthesia complications

## 2016-03-27 DIAGNOSIS — C679 Malignant neoplasm of bladder, unspecified: Secondary | ICD-10-CM | POA: Diagnosis not present

## 2016-03-27 LAB — BASIC METABOLIC PANEL
ANION GAP: 7 (ref 5–15)
BUN: 11 mg/dL (ref 6–20)
CALCIUM: 8.3 mg/dL — AB (ref 8.9–10.3)
CO2: 24 mmol/L (ref 22–32)
Chloride: 105 mmol/L (ref 101–111)
Creatinine, Ser: 0.88 mg/dL (ref 0.61–1.24)
GFR calc Af Amer: >60 mL/min (ref >60)
GFR calc non Af Amer: >60 mL/min (ref >60)
Glucose, Bld: 168 mg/dL — ABNORMAL HIGH (ref 65–99)
Potassium: 4.2 mmol/L (ref 3.5–5.1)
Sodium: 136 mmol/L (ref 135–145)

## 2016-03-27 LAB — CBC
HEMATOCRIT: 28.2 % — AB (ref 40.0–52.0)
HEMOGLOBIN: 8.9 g/dL — AB (ref 13.0–18.0)
MCH: 21.5 pg — AB (ref 26.0–34.0)
MCHC: 31.4 g/dL — ABNORMAL LOW (ref 32.0–36.0)
MCV: 68.5 fL — ABNORMAL LOW (ref 80.0–100.0)
Platelets: 238 10*3/uL (ref 150–440)
RBC: 4.11 MIL/uL — ABNORMAL LOW (ref 4.40–5.90)
RDW: 21.4 % — ABNORMAL HIGH (ref 11.5–14.5)
WBC: 10.6 10*3/uL (ref 3.8–10.6)

## 2016-03-27 NOTE — Care Management (Signed)
No discharge needs identified 

## 2016-03-27 NOTE — Telephone Encounter (Signed)
Spoke with patient and moved his apt to 11:00   michelle

## 2016-03-27 NOTE — Discharge Summary (Signed)
Date of admission: 03/26/2016  Date of discharge: 03/27/2016  Admission diagnosis: Bladder cancer  Discharge diagnosis: Bladder cancer  Secondary diagnoses:  Patient Active Problem List   Diagnosis Date Noted  . Bladder cancer ([place]) 03/26/2016  . Perforated bowel ([place]) 12/14/2015  . Perforation bowel ([place])   . Iron deficiency anemia due to chronic blood loss 09/07/2015  . Weight loss   . NSTEMI (non-ST elevated myocardial infarction) ([place]) 09/05/2015  . Absolute anemia   . Ischemic chest pain ([place])   . Uncontrolled type 2 diabetes mellitus with circulatory disorder ([place])   . Hyperlipidemia     History and Physical: For full details, please see admission history and physical. Briefly, Frederick Perry is a 60 y.o. year old patient with bladder cancer s/p TURBT who was admitted for CBI overnight.   Hospital Course: Patient tolerated the procedure well.  He was then transferred to the floor after an uneventful PACU stay.  His hospital course was uncomplicated.  On POD#1 he had met discharge criteria: was eating a regular diet, was up and ambulating independently,  pain was well controlled, and was ready to for discharge.  CBI was weaned with clear urine in foley bag. No clots. Tea-colored.   Laboratory values:   Recent Labs  03/27/16 0426  WBC 10.6  HGB 8.9*  HCT 28.2*    Recent Labs  03/27/16 0426  NA 136  K 4.2  CL 105  CO2 24  GLUCOSE 168*  BUN 11  CREATININE 0.88  CALCIUM 8.3*   No results for input(s): LABPT, INR in the last 72 hours. No results for input(s): LABURIN in the last 72 hours. Results for orders placed or performed during the hospital encounter of 02/22/16  Urine culture     Status: Abnormal   Collection Time: 02/22/16  9:09 AM  Result Value Ref Range Status   Specimen Description URINE, CLEAN CATCH  Final   Special Requests NONE  Final   Culture (A)  Final    1,000 COLONIES/mL INSIGNIFICANT GROWTH Performed at [facility]    Report  Status 02/23/2016 FINAL  Final    Disposition: Home  Discharge instruction: The patient was instructed to be ambulatory but told to refrain from heavy lifting, strenuous activity, or driving.   Discharge medications:   Medication List    STOP taking these medications   ALEVE 220 MG tablet Generic drug:  naproxen sodium     TAKE these medications   cephALEXin 500 MG capsule Commonly known as:  KEFLEX Take 1 capsule (500 mg total) by mouth 3 (three) times daily.   HYDROcodone-acetaminophen 5-325 MG tablet Commonly known as:  NORCO Take 1-2 tablets by mouth every 4 (four) hours as needed for moderate pain.       Followup:  Follow-up [place], MD Follow up in 1 week(s).   Specialty:  Urology Why:  foley removal and pathology   october 20th at 8:30am Contact information: [address] [place] [address] [phone]

## 2016-03-27 NOTE — Care Management (Signed)
Spoke with patient. He will take his prescriptions over to Medication Management to get them filled after discharge.

## 2016-03-27 NOTE — Progress Notes (Signed)
Tolerated CBI; No clots noted, light pink overnight with darkening post CBI on hold. Ambulated well in hall; PO pain meds effective. Barbaraann Faster, RN 7:28 AM 03/27/2016

## 2016-03-27 NOTE — Progress Notes (Signed)
Patient foley plug inserted where the CBI was infusing. Leg bag attached per patient request. IV site removed. Prescriptions given. Concerns addressed.

## 2016-03-28 LAB — SURGICAL PATHOLOGY

## 2016-04-04 ENCOUNTER — Ambulatory Visit (INDEPENDENT_AMBULATORY_CARE_PROVIDER_SITE_OTHER): Payer: Self-pay | Admitting: Urology

## 2016-04-04 ENCOUNTER — Encounter: Payer: Self-pay | Admitting: Urology

## 2016-04-04 ENCOUNTER — Ambulatory Visit: Payer: Self-pay

## 2016-04-04 VITALS — BP 117/83 | HR 109 | Ht 74.0 in | Wt 211.5 lb

## 2016-04-04 DIAGNOSIS — C678 Malignant neoplasm of overlapping sites of bladder: Secondary | ICD-10-CM

## 2016-04-04 NOTE — Progress Notes (Signed)
04/04/2016 11:08 AM   Frederick Perry 1955-10-29 [id number]  Referring provider: No referring provider defined for this encounter.  Chief Complaint  Patient presents with  . Routine Post Op    TURBT    HPI: The patient is a 60 year old gentleman who has a very large volume of bladder cancer. He recently underwent TURBT. The pathology is consistent with his first tumor resection. His pTa papillary urothelial carcinoma. It is 95% low-grade however there is a 5% high-grade component.  Approximate 6 cm the tumor was resected. He still has residual disease located mostly anteriorly within his bladder.  Patient tolerated the catheter by this time. He did not have bladder spasms while he was taking Myrbetriq.   PMH: Past Medical History:  Diagnosis Date  . Diabetes mellitus without complication ([place])    Not on medications  . Hypertension    Not on medications.stopped since he ran out of medications  . Myocardial infarction   . Polysubstance abuse    a. ongoing tobacco and alcohol abuse     Surgical History: Past Surgical History:  Procedure Laterality Date  . BACK SURGERY    . CARDIAC CATHETERIZATION N/A 09/05/2015   Procedure: Left Heart Cath and Coronary Angiography;  Surgeon: Minna Merritts, MD;  Location: [facility];  Service: Cardiovascular;  Laterality: N/A;  . DRAIN REMOVAL Right 12/20/2015   Procedure: DRAIN REMOVAL;  Surgeon: Nickie Retort, MD;  Location: [facility];  Service: Urology;  Laterality: Right;  by dr copper  . LAPAROTOMY N/A 12/14/2015   Procedure: EXPLORATORY LAPAROTOMY, prepyloric gastric ulcer biopsy;  Surgeon: Jules Husbands, MD;  Location: [facility];  Service: General;  Laterality: N/A;  . Patellar tendon repair Right   . PERIPHERAL VASCULAR CATHETERIZATION N/A 09/07/2015   Procedure: IVC Filter Insertion;  Surgeon: Algernon Huxley, MD;  Location: [facility];  Service: Cardiovascular;  Laterality: N/A;  . Testicular torsion repair      . TRANSURETHRAL RESECTION OF BLADDER TUMOR N/A 12/20/2015   Procedure: TRANSURETHRAL RESECTION OF BLADDER TUMOR (TURBT);  Surgeon: Nickie Retort, MD;  Location: [facility];  Service: Urology;  Laterality: N/A;  . TRANSURETHRAL RESECTION OF BLADDER TUMOR N/A 03/26/2016   Procedure: TRANSURETHRAL RESECTION OF BLADDER TUMOR (TURBT);  Surgeon: Nickie Retort, MD;  Location: [facility];  Service: Urology;  Laterality: N/A;    Home Medications:    Medication List    as of 04/04/2016 11:08 AM   You have not been prescribed any medications.     Allergies: No Known Allergies  Family History: Family History  Problem Relation Age of Onset  . Congestive Heart Failure Mother   . Peripheral vascular disease Father     Social History:  reports that he has been smoking Cigarettes.  He has been smoking about 0.50 packs per day. He has never used smokeless tobacco. He reports that he drinks about 12.6 oz of alcohol per week . He reports that he does not use drugs.  ROS: UROLOGY Frequent Urination?: No Hard to postpone urination?: No Burning/pain with urination?: No Get up at night to urinate?: No Leakage of urine?: No Urine stream starts and stops?: No Trouble starting stream?: No Do you have to strain to urinate?: No Blood in urine?: No Urinary tract infection?: No Sexually transmitted disease?: No Injury to kidneys or bladder?: No Painful intercourse?: No Weak stream?: No Erection problems?: No Penile pain?: No  Gastrointestinal Nausea?: No Vomiting?: No Indigestion/heartburn?: No Diarrhea?: No  Constipation?: No  Constitutional Fever: No Night sweats?: No Weight loss?: No Fatigue?: No  Skin Skin rash/lesions?: No Itching?: No  Eyes Blurred vision?: No Double vision?: No  Ears/Nose/Throat Sore throat?: No Sinus problems?: No  Hematologic/Lymphatic Swollen glands?: No Easy bruising?: No  Cardiovascular Leg swelling?: No Chest pain?:  No  Respiratory Cough?: No Shortness of breath?: No  Endocrine Excessive thirst?: No  Musculoskeletal Back pain?: No Joint pain?: No  Neurological Headaches?: No Dizziness?: No  Psychologic Depression?: No Anxiety?: No  Physical Exam: BP 117/83   Pulse (!) 109   Ht 6\' 2"  (1.88 m)   Wt 211 lb 8 oz (95.9 kg)   BMI 27.15 kg/m   Constitutional:  Alert and oriented, No acute distress. HEENT: [place] AT, moist mucus membranes.  Trachea midline, no masses. Cardiovascular: No clubbing, cyanosis, or edema. Respiratory: Normal respiratory effort, no increased work of breathing. GI: Abdomen is soft, nontender, nondistended, no abdominal masses GU: No CVA tenderness. Foley clear yellow Skin: No rashes, bruises or suspicious lesions. Lymph: No cervical or inguinal adenopathy. Neurologic: Grossly intact, no focal deficits, moving all 4 extremities. Psychiatric: Normal mood and affect.  Laboratory Data: Lab Results  Component Value Date   WBC 10.6 03/27/2016   HGB 8.9 (L) 03/27/2016   HCT 28.2 (L) 03/27/2016   MCV 68.5 (L) 03/27/2016   PLT 238 03/27/2016    Lab Results  Component Value Date   CREATININE 0.88 03/27/2016    No results found for: PSA  No results found for: TESTOSTERONE  Lab Results  Component Value Date   HGBA1C 7.4 (H) 09/05/2015    Urinalysis    Component Value Date/Time   COLORURINE YELLOW (A) 09/05/2015 1915   APPEARANCEUR CLOUDY (A) 09/05/2015 1915   LABSPEC 1.031 (H) 09/05/2015 1915   PHURINE 9.0 (H) 09/05/2015 1915   GLUCOSEU NEGATIVE 09/05/2015 1915   HGBUR 3+ (A) 09/05/2015 1915   BILIRUBINUR NEGATIVE 09/05/2015 1915   KETONESUR NEGATIVE 09/05/2015 1915   PROTEINUR 100 (A) 09/05/2015 1915   NITRITE POSITIVE (A) 09/05/2015 1915   LEUKOCYTESUR NEGATIVE 09/05/2015 1915      Assessment & Plan:    I discussed with the patient is relatively favorable pathology results. He does understand he needs a completion of resection and he still  has tumor in his bladder. We'll schedule for TURBT in 5 weeks to allow appropriate bladder healing. He understands the goal being to complete the resection at that time though there is a risky still may need an additional procedure.  1. pTa bladder cancer -repeat TURBT -Myrbetriq samples given for bladder spasms to be taken after next procedure when foley is in place.   Nickie Retort, MD  [facility] [address] [place], [place] [address] [phone]

## 2016-04-07 ENCOUNTER — Telehealth: Payer: Self-pay | Admitting: Radiology

## 2016-04-07 ENCOUNTER — Other Ambulatory Visit: Payer: Self-pay | Admitting: Radiology

## 2016-04-07 DIAGNOSIS — C679 Malignant neoplasm of bladder, unspecified: Secondary | ICD-10-CM

## 2016-04-07 NOTE — Telephone Encounter (Signed)
Notified pt that pre-admit testing appt has been changed to an office visit on 04/29/16 @2 :00 due to needing labwork prior to surgery pt voices understanding.

## 2016-04-07 NOTE — Telephone Encounter (Signed)
Notified pt of surgery scheduled with Dr Pilar Jarvis on 05/14/16, pre-admit phone interview on 05/06/16 between 9am-1pm & to call day prior to surgery for arrival time to [facility]. Pt voices understanding.

## 2016-04-29 ENCOUNTER — Inpatient Hospital Stay: Admission: RE | Admit: 2016-04-29 | Payer: Self-pay | Source: Ambulatory Visit

## 2016-05-06 ENCOUNTER — Encounter
Admission: RE | Admit: 2016-05-06 | Discharge: 2016-05-06 | Disposition: A | Discharge status: Home / Self Care | Payer: Medicaid Other | Source: Ambulatory Visit | Attending: Urology | Admitting: Urology

## 2016-05-06 ENCOUNTER — Inpatient Hospital Stay: Admission: RE | Admit: 2016-05-06 | Payer: Self-pay | Source: Ambulatory Visit

## 2016-05-06 DIAGNOSIS — Z01812 Encounter for preprocedural laboratory examination: Secondary | ICD-10-CM | POA: Insufficient documentation

## 2016-05-06 DIAGNOSIS — C679 Malignant neoplasm of bladder, unspecified: Secondary | ICD-10-CM | POA: Diagnosis not present

## 2016-05-06 HISTORY — DX: Other pulmonary embolism without acute cor pulmonale: I26.99

## 2016-05-06 LAB — CBC
HCT: 32.8 % — ABNORMAL LOW (ref 40.0–52.0)
Hemoglobin: 10 g/dL — ABNORMAL LOW (ref 13.0–18.0)
MCH: 21.5 pg — AB (ref 26.0–34.0)
MCHC: 30.6 g/dL — ABNORMAL LOW (ref 32.0–36.0)
MCV: 70.2 fL — ABNORMAL LOW (ref 80.0–100.0)
PLATELETS: 245 10*3/uL (ref 150–440)
RBC: 4.67 MIL/uL (ref 4.40–5.90)
RDW: 24.2 % — ABNORMAL HIGH (ref 11.5–14.5)
WBC: 5.7 10*3/uL (ref 3.8–10.6)

## 2016-05-06 LAB — APTT: aPTT: 30 seconds (ref 24–36)

## 2016-05-06 LAB — BASIC METABOLIC PANEL
Anion gap: 6 (ref 5–15)
BUN: 9 mg/dL (ref 6–20)
CALCIUM: 8.7 mg/dL — AB (ref 8.9–10.3)
CO2: 27 mmol/L (ref 22–32)
CREATININE: 1.12 mg/dL (ref 0.61–1.24)
Chloride: 103 mmol/L (ref 101–111)
GFR calc non Af Amer: >60 mL/min (ref >60)
Glucose, Bld: 179 mg/dL — ABNORMAL HIGH (ref 65–99)
Potassium: 3.9 mmol/L (ref 3.5–5.1)
SODIUM: 136 mmol/L (ref 135–145)

## 2016-05-06 LAB — PROTIME-INR
INR: 1.13
PROTHROMBIN TIME: 14.6 s (ref 11.4–15.2)

## 2016-05-06 NOTE — Patient Instructions (Signed)
Your procedure is scheduled on: Wednesday 05/14/16 Report to Day Surgery. 2ND FLOOR MEDICAL MALL ENTRANCE To find out your arrival time please call [phone] between 1PM - 3PM on Tuesday 05/13/16.  Remember: Instructions that are not followed completely may result in serious medical risk, up to and including death, or upon the discretion of your surgeon and anesthesiologist your surgery may need to be rescheduled.    __X__ 1. Do not eat food or drink liquids after midnight. No gum chewing or hard candies.     __X__ 2. No Alcohol for 24 hours before or after surgery.   ____ 3. Bring all medications with you on the day of surgery if instructed.    __X__ 4. Notify your doctor if there is any change in your medical condition     (cold, fever, infections).     Do not wear jewelry, make-up, hairpins, clips or nail polish.  Do not wear lotions, powders, or perfumes.   Do not shave 48 hours prior to surgery. Men may shave face and neck.  Do not bring valuables to the hospital.    [facility] is not responsible for any belongings or valuables.               Contacts, dentures or bridgework may not be worn into surgery.  Leave your suitcase in the car. After surgery it may be brought to your room.  For patients admitted to the hospital, discharge time is determined by your                treatment team.   Patients discharged the day of surgery will not be allowed to drive home.   Please read over the following fact sheets that you were given:   Pain Booklet   ____ Take these medicines the morning of surgery with A SIP OF WATER:    1. NONE  2.   3.   4.  5.  6.  ____ Fleet Enema (as directed)   ____ Use CHG Soap as directed  ____ Use inhalers on the day of surgery  ____ Stop metformin 2 days prior to surgery    ____ Take 1/2 of usual insulin dose the night before surgery and none on the morning of surgery.   ____ Stop Coumadin/Plavix/aspirin on   __X__ Stop  Anti-inflammatories on NO ALEVE, MAY USE TYLENOL FOR PAIN   ____ Stop supplements until after surgery.    ____ Bring C-Pap to the hospital.

## 2016-05-07 LAB — URINE CULTURE

## 2016-05-07 NOTE — Pre-Procedure Instructions (Signed)
CBC sent to Dr. Pilar Jarvis and Anesthesia for review.

## 2016-05-12 ENCOUNTER — Other Ambulatory Visit: Payer: Self-pay

## 2016-05-12 DIAGNOSIS — C679 Malignant neoplasm of bladder, unspecified: Secondary | ICD-10-CM

## 2016-05-12 NOTE — Pre-Procedure Instructions (Signed)
URINE CULTURE SUGGESTS RECOLLECTION/ CALLED TO AMY AT DR Pilar Jarvis. THEY WILL RECOLLECT IF NEEDED IN THEIR OFFICE

## 2016-05-13 ENCOUNTER — Other Ambulatory Visit: Payer: Self-pay

## 2016-05-13 DIAGNOSIS — C679 Malignant neoplasm of bladder, unspecified: Secondary | ICD-10-CM

## 2016-05-13 LAB — URINALYSIS, COMPLETE
BILIRUBIN UA: NEGATIVE
GLUCOSE, UA: NEGATIVE
KETONES UA: NEGATIVE
NITRITE UA: NEGATIVE
SPEC GRAV UA: 1.025 (ref 1.005–1.030)
UUROB: 1 mg/dL (ref 0.2–1.0)
pH, UA: 5.5 (ref 5.0–7.5)

## 2016-05-13 LAB — MICROSCOPIC EXAMINATION

## 2016-05-18 LAB — CULTURE, URINE COMPREHENSIVE

## 2016-05-19 ENCOUNTER — Other Ambulatory Visit: Payer: Self-pay | Admitting: Radiology

## 2016-05-19 ENCOUNTER — Telehealth: Payer: Self-pay | Admitting: Radiology

## 2016-05-19 MED ORDER — CIPROFLOXACIN HCL 500 MG PO TABS: 500.0000 mg | ORAL_TABLET | Freq: Two times a day (BID) | ORAL | 0 refills | Status: DC

## 2016-05-19 MED ORDER — FLUCONAZOLE 150 MG PO TABS: 150.0000 mg | ORAL_TABLET | Freq: Once | ORAL | 0 refills | Status: AC

## 2016-05-19 NOTE — Telephone Encounter (Signed)
Notified pt of prescriptions sent to pharmacy, repeat ucx scheduled 05/23/16 @11 :00 & surgery scheduled 05/29/16 with Dr Pilar Jarvis. Pt voices understanding.

## 2016-05-19 NOTE — Telephone Encounter (Signed)
-----   Message from Nickie Retort, MD sent at 05/19/2016  9:37 AM EST ----- Please start him on cipro 500 mg BID for a week. Also give one dose of diflucan 150 mg PO. Thanks.

## 2016-05-22 ENCOUNTER — Other Ambulatory Visit: Payer: Self-pay

## 2016-05-22 DIAGNOSIS — C679 Malignant neoplasm of bladder, unspecified: Secondary | ICD-10-CM

## 2016-05-23 ENCOUNTER — Other Ambulatory Visit: Payer: Self-pay

## 2016-05-23 DIAGNOSIS — C679 Malignant neoplasm of bladder, unspecified: Secondary | ICD-10-CM

## 2016-05-28 LAB — CULTURE, URINE COMPREHENSIVE

## 2016-05-28 NOTE — Telephone Encounter (Signed)
Pt called stating he has a cold & needs to reschedule surgery scheduled 05/29/16. Advised pt that next available date with Dr Pilar Jarvis is 06/27/16. Pt wants to proceed on that date. Will discuss with Dr Pilar Jarvis.

## 2016-05-29 ENCOUNTER — Ambulatory Visit: Admission: RE | Admit: 2016-05-29 | Payer: Self-pay | Source: Ambulatory Visit | Admitting: Urology

## 2016-05-29 ENCOUNTER — Encounter: Admission: RE | Payer: Self-pay | Source: Ambulatory Visit

## 2016-05-29 SURGERY — TURBT (TRANSURETHRAL RESECTION OF BLADDER TUMOR)
Anesthesia: Choice

## 2016-06-23 ENCOUNTER — Other Ambulatory Visit: Payer: Self-pay | Admitting: Radiology

## 2016-06-23 ENCOUNTER — Telehealth: Payer: Self-pay | Admitting: Radiology

## 2016-06-23 DIAGNOSIS — C679 Malignant neoplasm of bladder, unspecified: Secondary | ICD-10-CM

## 2016-06-23 NOTE — Telephone Encounter (Signed)
No answer & unable to LM on home phone #. Cell phone # was busy. Need to discuss upcoming surgery information.

## 2016-06-30 NOTE — Telephone Encounter (Signed)
LMOM of cell phone. No answer & unable to LM on home phone #.

## 2016-07-01 NOTE — Telephone Encounter (Signed)
Notified pt's mother of pre-admit testing appt on 07/08/16 @10 :16. Mother said she would have pt return call as she has arthritis & is unable to write message. Also LM on cell phone to return call.

## 2016-07-07 NOTE — Telephone Encounter (Signed)
LMOM

## 2016-07-08 ENCOUNTER — Inpatient Hospital Stay: Admission: RE | Admit: 2016-07-08 | Payer: Self-pay | Source: Ambulatory Visit

## 2016-07-08 NOTE — Telephone Encounter (Signed)
LM on cell to reschedule missed pre-admit testing appt. No answer & unable to LM on home number.

## 2016-07-09 NOTE — Telephone Encounter (Signed)
LMOM

## 2016-07-10 ENCOUNTER — Encounter: Payer: Self-pay | Admitting: Radiology

## 2016-07-10 NOTE — Telephone Encounter (Signed)
LMOM on cell #, no answer on home #. Will mail letter to pt.

## 2016-07-14 NOTE — Telephone Encounter (Signed)
LMOM on cell phone. No answer & unable to LM on home phone #.

## 2016-07-18 ENCOUNTER — Ambulatory Visit: Admission: RE | Admit: 2016-07-18 | Payer: Medicaid Other | Source: Ambulatory Visit | Admitting: Urology

## 2016-07-18 ENCOUNTER — Encounter: Admission: RE | Payer: Self-pay | Source: Ambulatory Visit

## 2016-07-18 SURGERY — TURBT (TRANSURETHRAL RESECTION OF BLADDER TUMOR)
Anesthesia: Choice

## 2017-09-27 IMAGING — CT CT CTA ABD/PEL W/CM AND/OR W/O CM
3 of 9 series · 12 of 46 positions shown, 18 images · IV contrast (APPLIED)
Comparison: 09/06/2015

CLINICAL DATA: Diffuse abdominal pain for over week.

EXAM:
CTA ABDOMEN AND PELVIS wITHOUT AND WITH CONTRAST
TECHNIQUE: Multidetector CT imaging of the abdomen and pelvis was performed
using the standard protocol during bolus administration of
intravenous contrast. Multiplanar reconstructed images and MIPs were
obtained and reviewed to evaluate the vascular anatomy.
CONTRAST:  100 mL Isovue 370 intravenous

[Series 4: arterial · axial · arterial · 0.80mm/px · z∈[-418,-314]mm · 3 of 258 slices shown]
[im 26/258  soft-tissue]
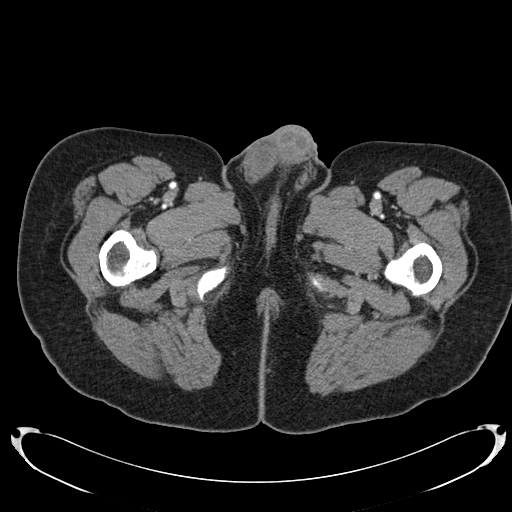
[im 52/258  soft-tissue]
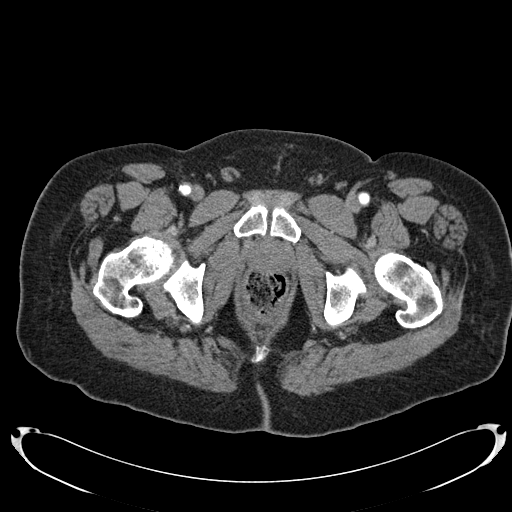
[im 78/258  soft-tissue]
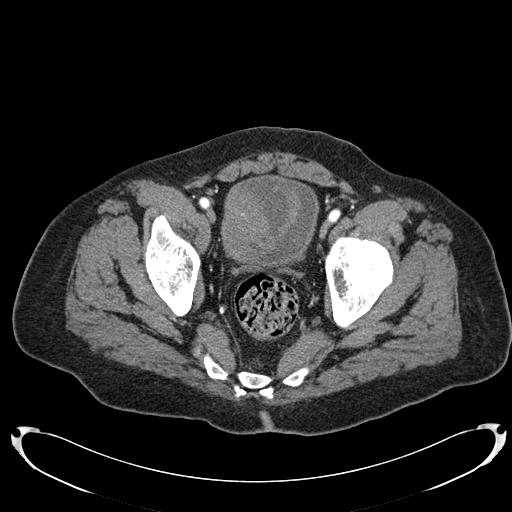

[Series 6: venous · axial · portal-venous · 0.80mm/px · z∈[-404,-20]mm · 7 of 103 slices shown, 12 images]
[im 13/103  soft-tissue]
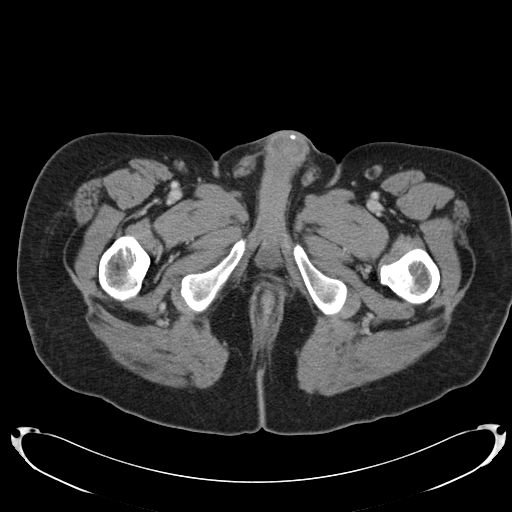
[im 13/103  bone]
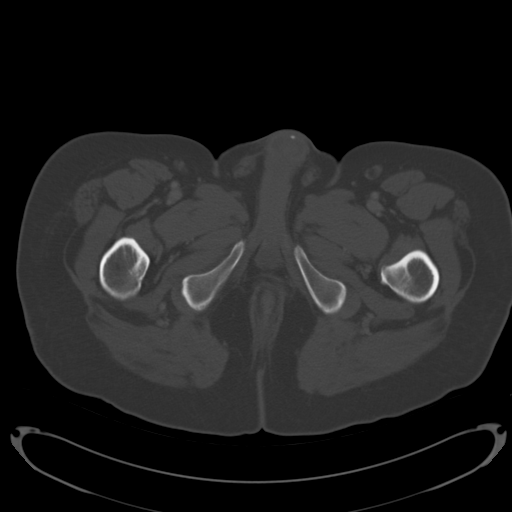
[im 26/103  soft-tissue]
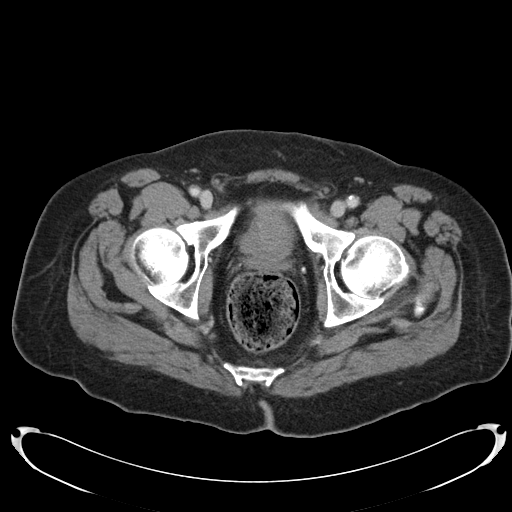
[im 39/103  soft-tissue]
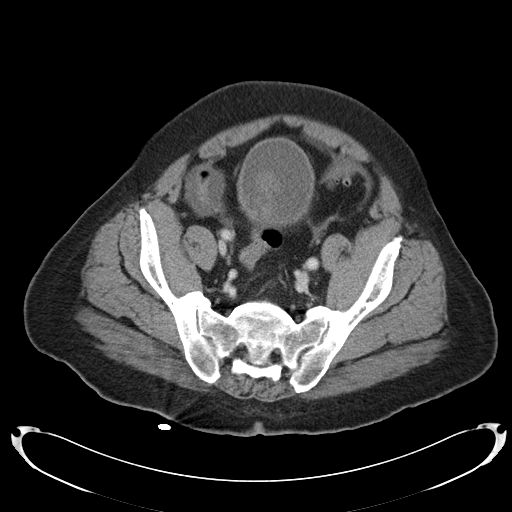
[im 52/103  soft-tissue]
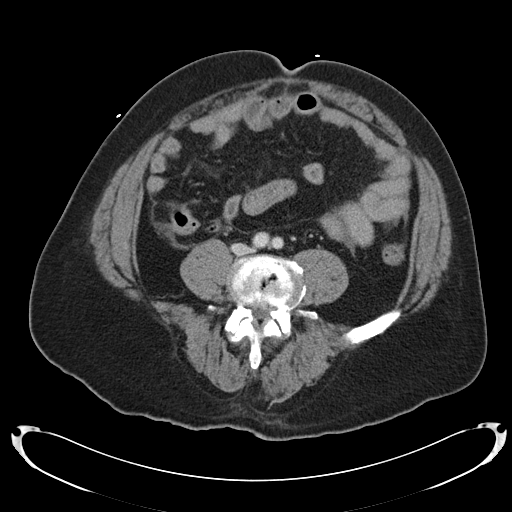
[im 52/103  lung]
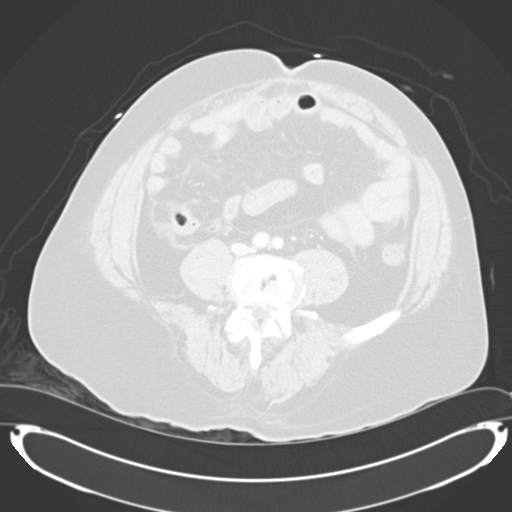
[im 64/103  soft-tissue]
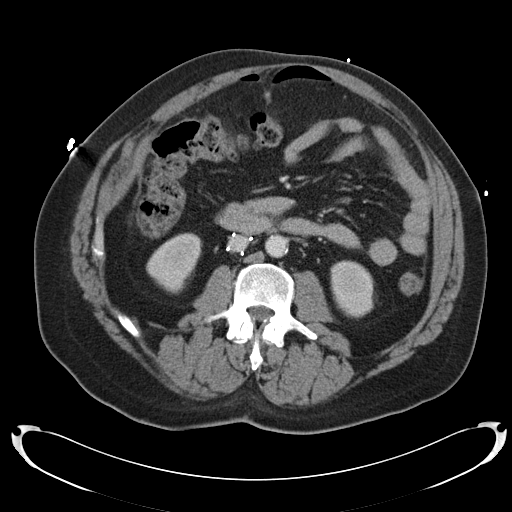
[im 64/103  lung]
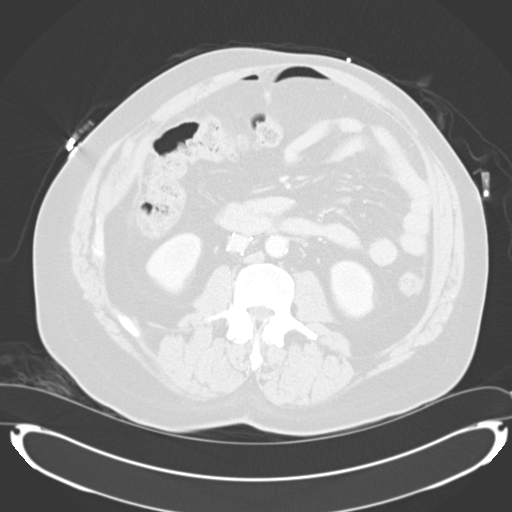
[im 77/103  soft-tissue]
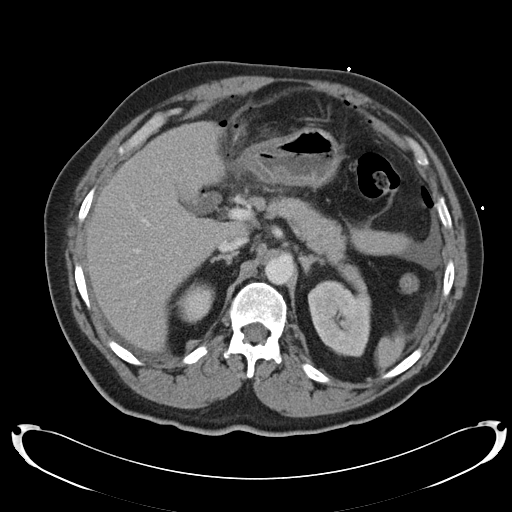
[im 77/103  lung]
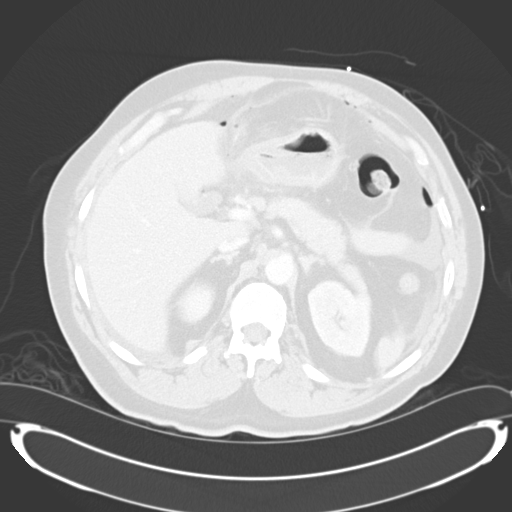
[im 90/103  soft-tissue]
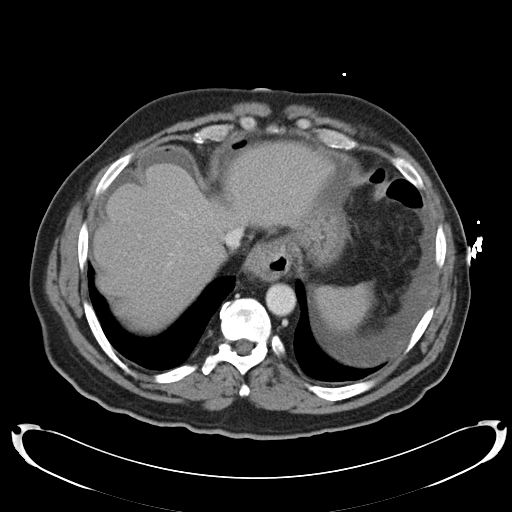
[im 90/103  lung]
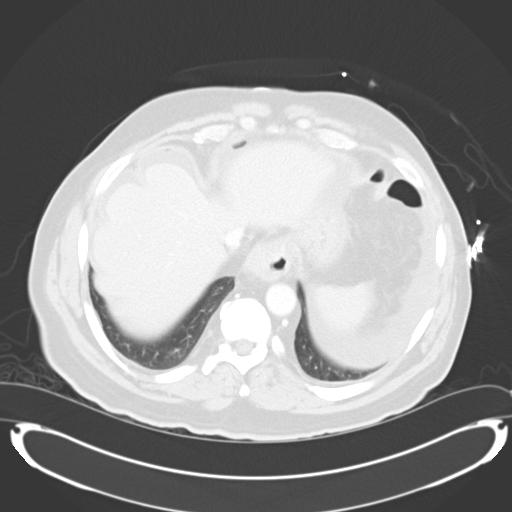

[Series 8: cor arterial mpr · coronal · arterial · 0.83mm/px · 2 of 150 slices shown, 3 images]
[im 50/150  soft-tissue]
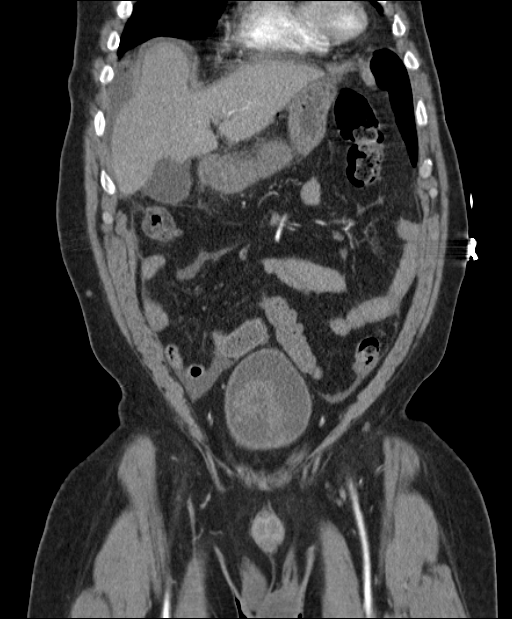
[im 50/150  bone]
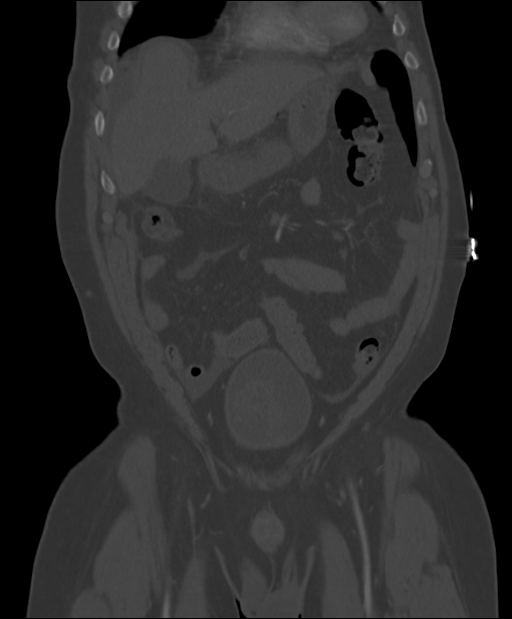
[im 100/150  soft-tissue]
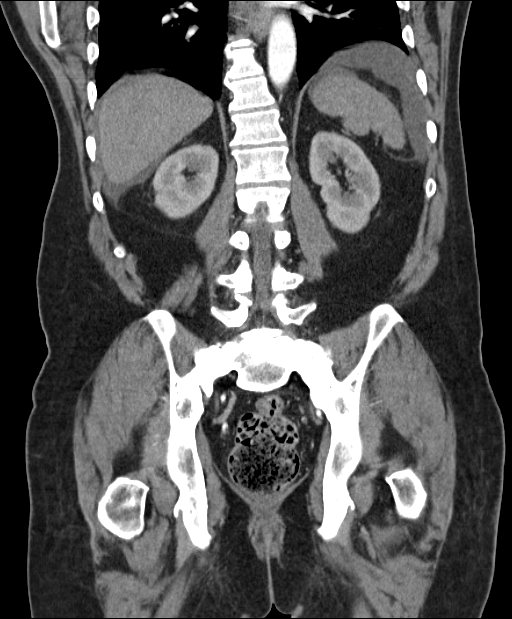

[12 of 46 positions shown; findings below may reference images not displayed]

FINDINGS: There is free intraperitoneal air. Source of the free air is not
conclusive, but there is mural edema at the gastric antrum, pylorus
and duodenal bulb. This may be the site of a hollow viscus
perforation.

Small to moderate volume ascites.

There are unremarkable appearances of the liver, gallbladder, bile
ducts, pancreas, spleen, adrenals and kidneys. Ureters are
unremarkable.

The urinary bladder contains a large volume of irregular soft tissue
which may represent blood. Alternatively, a urinary bladder neoplasm
may be considered.

There is a hiatal hernia. There is uncomplicated colonic
diverticulosis.

The abdominal aorta is normal in caliber with moderate
atherosclerotic calcification.

There is an IVC filter.

There is no significant abnormality in the lower chest.

There is no significant skeletal lesion.

CT angiographic images demonstrate normal caliber of the abdominal
aorta. The celiac, SMA and IMA are patent. Renal arteries are
patent. Common iliac and external iliac arteries are patent. Review
of the MIP and multiplanar reconstruction images confirms these
findings.
IMPRESSION: 1. Free intraperitoneal air. Mural edema at the gastric antrum,
pylorus and duodenal bulb suggest this may be the location of the
hollow viscus perforation. These results were called by telephone at
the time of interpretation on 12/14/2015 at [DATE] to Dr. QUIRIJN
RAMNARINE , who verbally acknowledged these results.
2. Large volume blood or neoplasm within the urinary bladder.
3. Hiatal hernia.
4. Diverticulosis.
5. Small volume ascites.

## 2018-01-18 IMAGING — US US EXTREM LOW VENOUS BILAT
1 series · 13 of 24 positions shown · non-contrast
Comparison: None.

CLINICAL DATA: Bilateral lower extremity swelling. Pulmonary
embolism on anticoagulation therapy.



[Series 1: us extrem low venous bilat · 0.08mm/px · 13 of 100 slices shown]
[im 1/100]
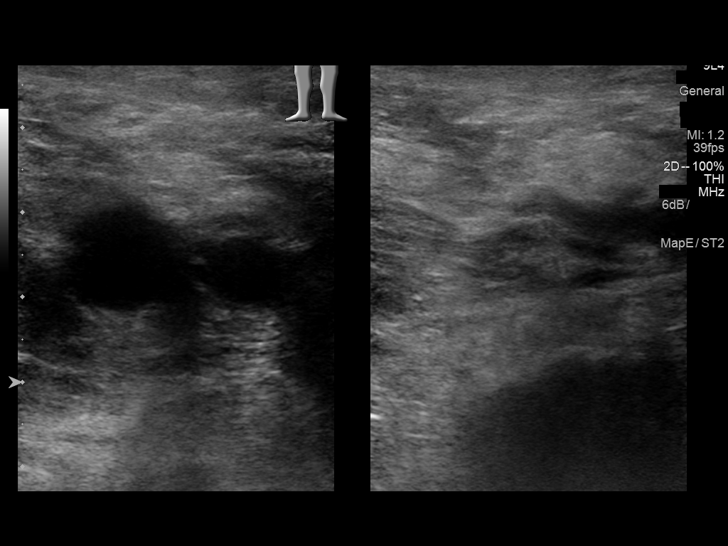
[im 9/100]
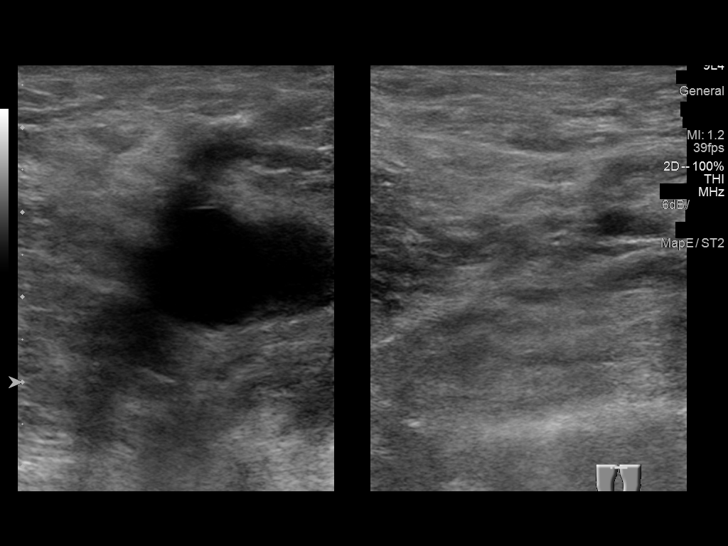
[im 18/100]
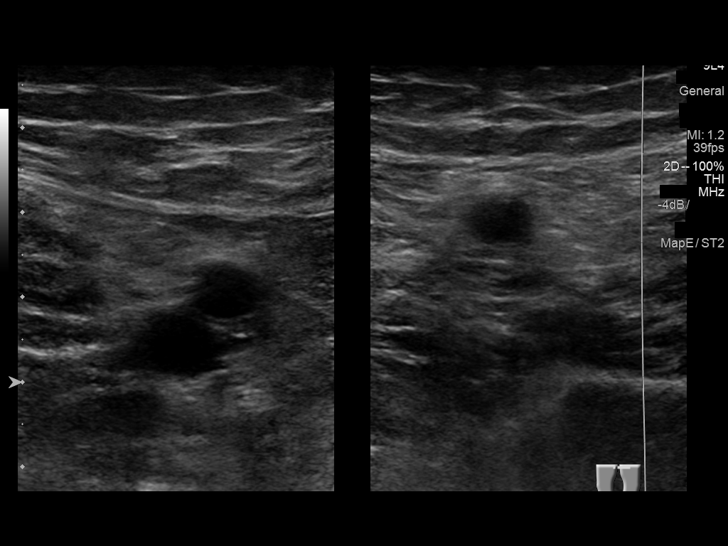
[im 26/100]
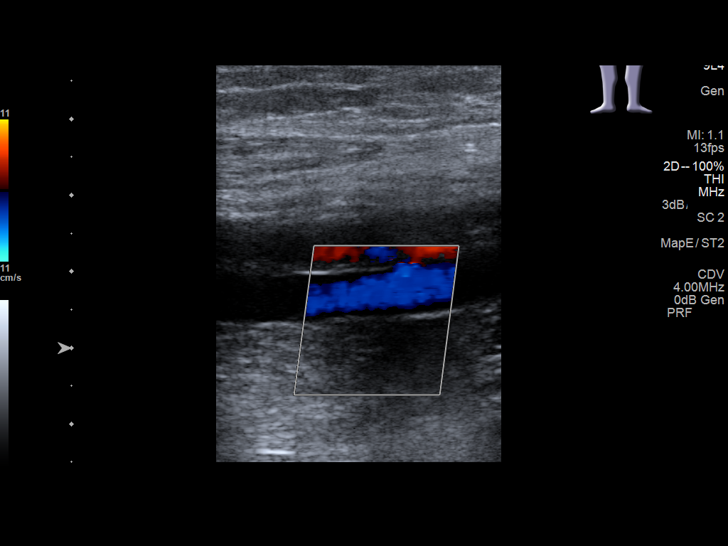
[im 35/100]
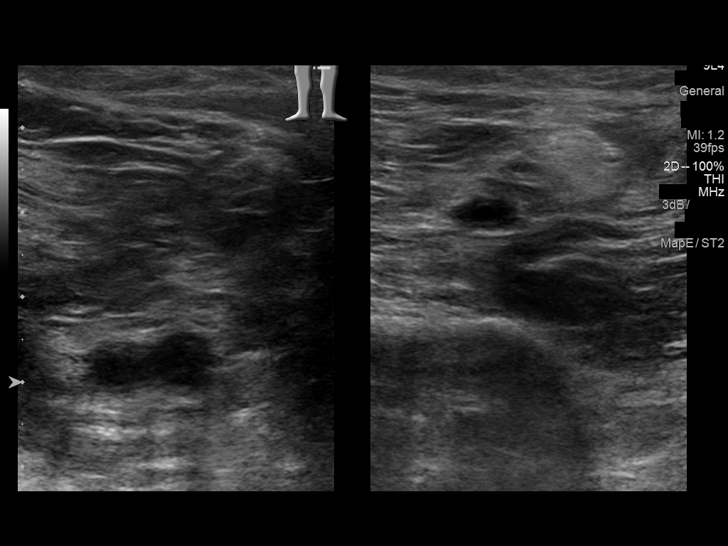
[im 44/100]
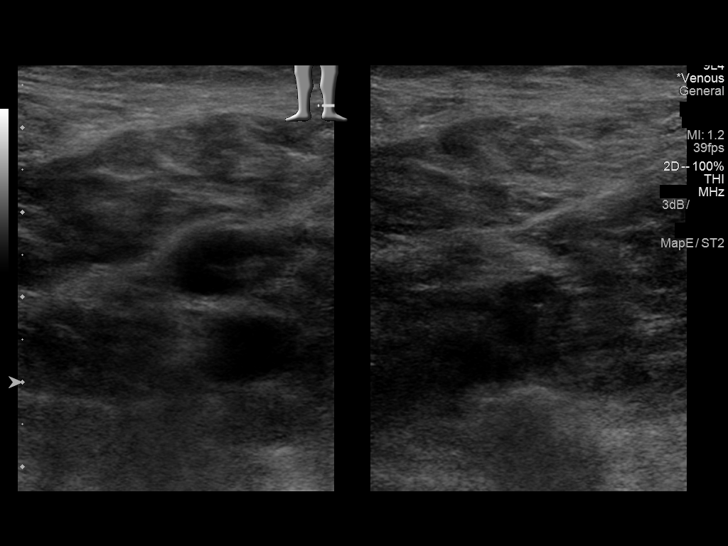
[im 52/100]
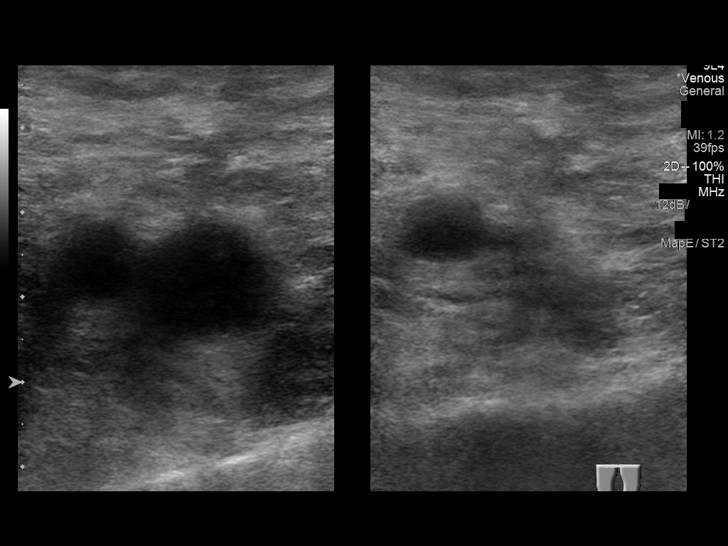
[im 56/100]
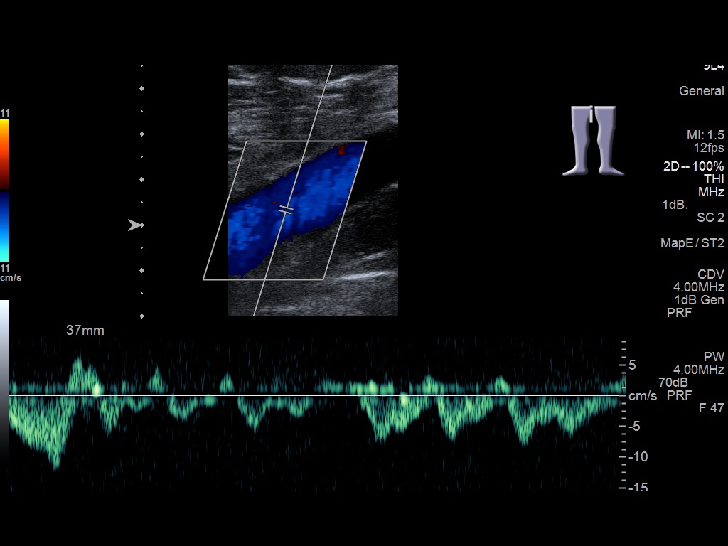
[im 65/100]
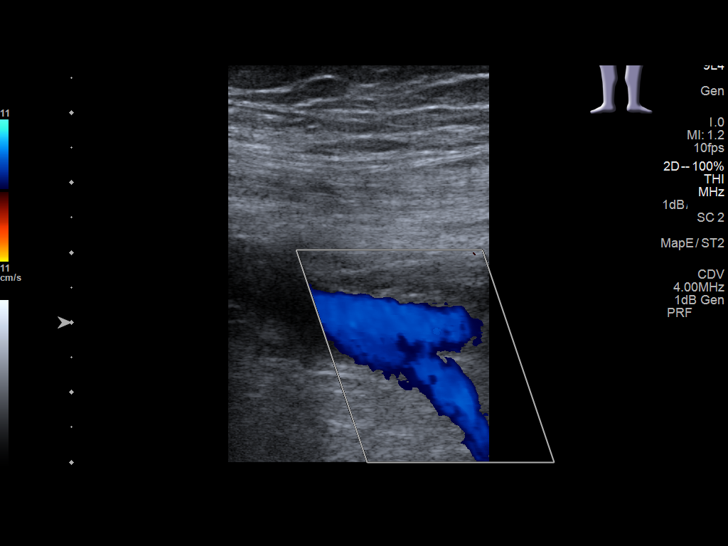
[im 74/100]
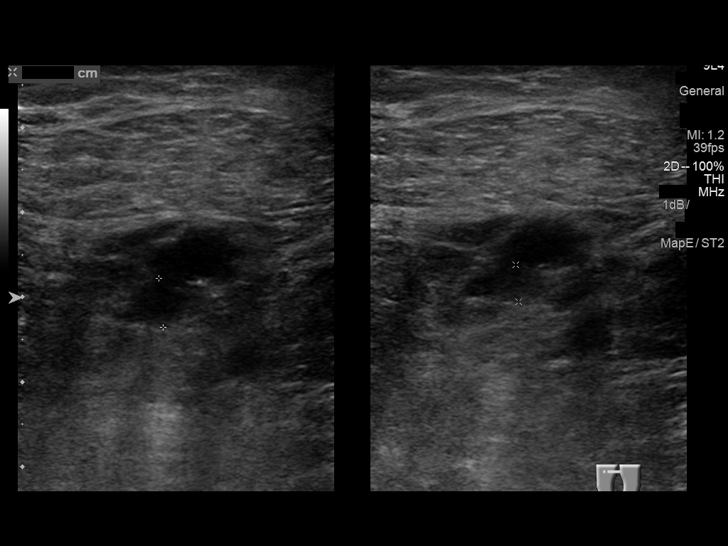
[im 82/100]
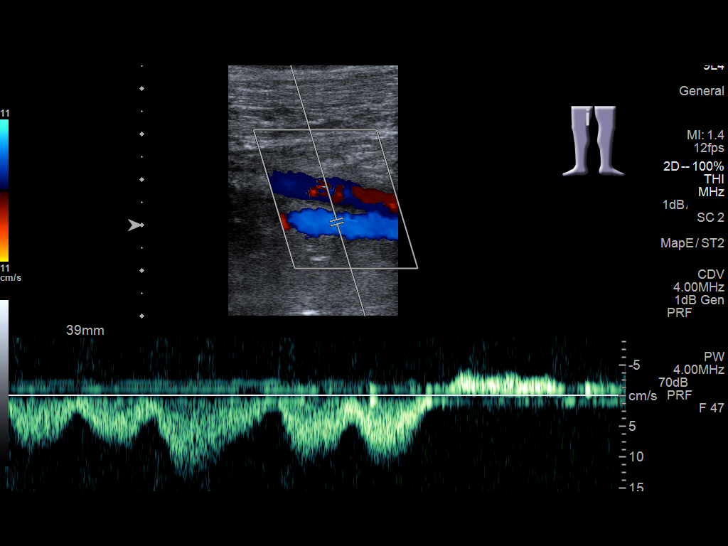
[im 91/100]
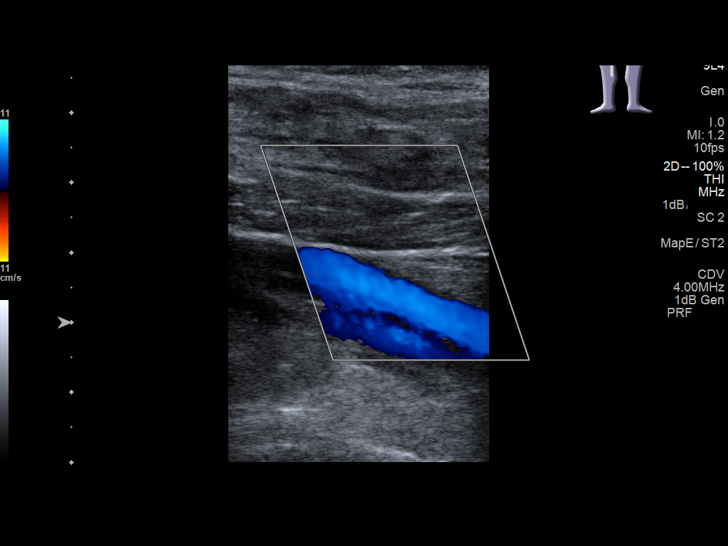
[im 100/100]
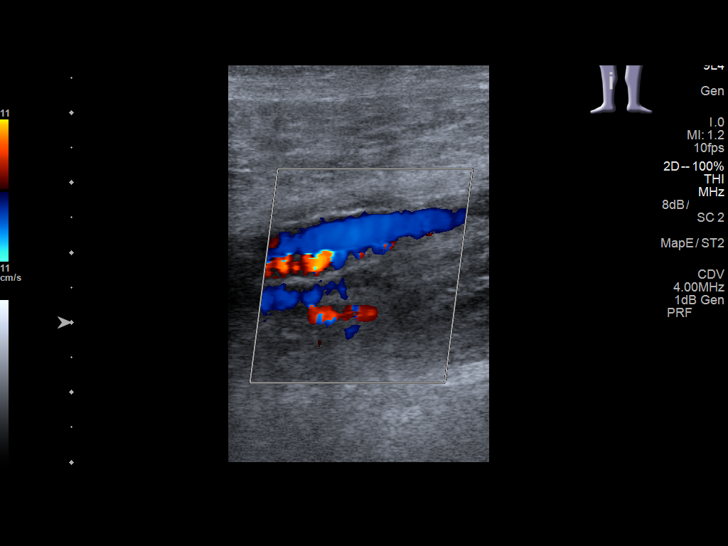

[13 of 24 positions shown; findings below may reference images not displayed]

FINDINGS: RIGHT LOWER EXTREMITY

Common Femoral Vein: No evidence of thrombus. Normal
compressibility, respiratory phasicity and response to augmentation.

Saphenofemoral Junction: No evidence of thrombus. Normal
compressibility and flow on color Doppler imaging.

Profunda Femoral Vein: No evidence of thrombus. Normal
compressibility and flow on color Doppler imaging.

Femoral Vein: Partially occlusive thrombus is seen involving the
femoral vein.

Popliteal Vein: Partially occlusive thrombus is seen involving the
popliteal vein.

Calf Veins: Partially occlusive thrombus is seen within the
posterior tibial vein. Perineal vein is not well visualized.

Superficial Great Saphenous Vein: No evidence of thrombus. Normal
compressibility and flow on color Doppler imaging.

Venous Reflux:  None.

Other Findings:  None.

LEFT LOWER EXTREMITY

Common Femoral Vein: No evidence of thrombus. Normal
compressibility, respiratory phasicity and response to augmentation.

Saphenofemoral Junction: No evidence of thrombus. Normal
compressibility and flow on color Doppler imaging.

Profunda Femoral Vein: No evidence of thrombus. Normal
compressibility and flow on color Doppler imaging.

Femoral Vein: No evidence of thrombus. Normal compressibility,
respiratory phasicity and response to augmentation.

Popliteal Vein: No evidence of thrombus. Normal compressibility,
respiratory phasicity and response to augmentation.

Calf Veins: No evidence of thrombus. Normal compressibility and flow
on color Doppler imaging.

Superficial Great Saphenous Vein: No evidence of thrombus. Normal
compressibility and flow on color Doppler imaging.

Venous Reflux:  None.

Other Findings:  None.
IMPRESSION: Partially occlusive thrombus in the right lower extremity involving
the femoral, popliteal, and posterior tibial veins.

No evidence of left lower extremity DVT.

## 2018-02-04 ENCOUNTER — Ambulatory Visit: Payer: Self-pay | Admitting: Adult Health Nurse Practitioner

## 2018-02-04 VITALS — BP 155/105 | HR 89 | Temp 99.0°F | Ht 73.0 in | Wt 242.7 lb

## 2018-02-04 DIAGNOSIS — D509 Iron deficiency anemia, unspecified: Secondary | ICD-10-CM

## 2018-02-04 DIAGNOSIS — E785 Hyperlipidemia, unspecified: Secondary | ICD-10-CM

## 2018-02-04 DIAGNOSIS — E119 Type 2 diabetes mellitus without complications: Secondary | ICD-10-CM | POA: Insufficient documentation

## 2018-02-04 MED ORDER — LISINOPRIL 20 MG PO TABS: 20.0000 mg | ORAL_TABLET | Freq: Every day | ORAL | 1 refills | Status: DC

## 2018-02-04 MED ORDER — METFORMIN HCL 1000 MG PO TABS: 500.0000 mg | ORAL_TABLET | Freq: Every day | ORAL | 1 refills | Status: DC

## 2018-02-04 NOTE — Progress Notes (Signed)
   Subjective:    Patient ID: Frederick Perry, male    DOB: 08-09-1955, 62 y.o.   MRN: [id number]  HPI  Frederick Perry is a 62 yo M here to establish care with [place]. He has history of Type 2 DM, HTN, and Hyperlipidemia. History of MI. He is currently not taking any medications.  DM: he reports he was previously taking Metformin 500mg . He endorses checking his feet regularly and denies neuropathy.  HTN: BP elevated at 155/105. He does not remember what his last medication was. Hyperlipidemia: He reports he has not previously been on medication.  He denies trouble urinating.  He is a current smoker and drinks 24oz beer 5 times/week. He denies using drugs.   Patient Active Problem List   Diagnosis Date Noted  . Bladder cancer ([place]) 03/26/2016  . Perforated bowel ([place]) 12/14/2015  . Perforation bowel ([place])   . Iron deficiency anemia due to chronic blood loss 09/07/2015  . Weight loss   . NSTEMI (non-ST elevated myocardial infarction) ([place]) 09/05/2015  . Absolute anemia   . Ischemic chest pain   . Uncontrolled type 2 diabetes mellitus with circulatory disorder ([place])   . Hyperlipidemia    Allergies as of 02/04/2018   No Known Allergies     Medication List        Accurate as of 02/04/18  7:35 PM. Always use your most recent med list.          ALEVE 220 MG Caps Generic drug:  Naproxen Sodium Take 1 capsule by mouth every 12 (twelve) hours as needed.   ciprofloxacin 500 MG tablet Commonly known as:  CIPRO Take 1 tablet (500 mg total) by mouth 2 (two) times daily.         Review of Systems  All other systems reviewed and are negative.      Objective:   Physical Exam  Constitutional: He is oriented to person, place, and time. He appears well-developed and well-nourished.  Cardiovascular: Normal rate, regular rhythm, normal heart sounds and intact distal pulses.  Pulmonary/Chest: Effort normal and breath sounds normal.  Abdominal: Soft. Bowel sounds are normal.    Neurological: He is alert and oriented to person, place, and time.  Skin: Skin is warm and dry.  Psychiatric: He has a normal mood and affect. His behavior is normal. Judgment and thought content normal.  Vitals reviewed.   BP (!) 155/105   Pulse 89   Temp 99 F (37.2 C)   Ht 6\' 1"  (1.854 m)   Wt 242 lb 11.2 oz (110.1 kg)   BMI 32.02 kg/m        Assessment & Plan:   Routine labs tonight.   DM: Restart Metformin 500mg  daily. Reevaluate with lab results.    HTN: Not controlled.  Begin Lisinopril 20mg  daily. Continue to monitor. Discussed w/ pt signs of stroke and to call 911 if any symptoms.   Hyperlipidemia: Continue to monitor and evaluate need for medications after lab results.  F/u in 1 mo for lab results and evaluate treatments.

## 2018-02-05 LAB — CBC
Hematocrit: 51.3 % — ABNORMAL HIGH (ref 37.5–51.0)
Hemoglobin: 17.3 g/dL (ref 13.0–17.7)
MCH: 30.9 pg (ref 26.6–33.0)
MCHC: 33.7 g/dL (ref 31.5–35.7)
MCV: 92 fL (ref 79–97)
PLATELETS: 222 10*3/uL (ref 150–450)
RBC: 5.6 x10E6/uL (ref 4.14–5.80)
RDW: 15.4 % (ref 12.3–15.4)
WBC: 6.7 10*3/uL (ref 3.4–10.8)

## 2018-02-05 LAB — TSH: TSH: 2.77 u[IU]/mL (ref 0.450–4.500)

## 2018-02-05 LAB — COMPREHENSIVE METABOLIC PANEL
A/G RATIO: 1.2 (ref 1.2–2.2)
ALT: 16 IU/L (ref 0–44)
AST: 21 IU/L (ref 0–40)
Albumin: 4.1 g/dL (ref 3.6–4.8)
Alkaline Phosphatase: 76 IU/L (ref 39–117)
BUN/Creatinine Ratio: 9 — ABNORMAL LOW (ref 10–24)
BUN: 11 mg/dL (ref 8–27)
Bilirubin Total: 1.2 mg/dL (ref 0.0–1.2)
CALCIUM: 9.2 mg/dL (ref 8.6–10.2)
CHLORIDE: 101 mmol/L (ref 96–106)
CO2: 21 mmol/L (ref 20–29)
Creatinine, Ser: 1.18 mg/dL (ref 0.76–1.27)
GFR, EST AFRICAN AMERICAN: 77 mL/min/{1.73_m2} (ref >59)
GFR, EST NON AFRICAN AMERICAN: 67 mL/min/{1.73_m2} (ref >59)
GLUCOSE: 100 mg/dL — AB (ref 65–99)
Globulin, Total: 3.4 g/dL (ref 1.5–4.5)
POTASSIUM: 4.3 mmol/L (ref 3.5–5.2)
Sodium: 139 mmol/L (ref 134–144)
TOTAL PROTEIN: 7.5 g/dL (ref 6.0–8.5)

## 2018-02-05 LAB — LIPID PANEL
CHOLESTEROL TOTAL: 179 mg/dL (ref 100–199)
Chol/HDL Ratio: 3.7 ratio (ref 0.0–5.0)
HDL: 48 mg/dL (ref >39)
LDL Calculated: 112 mg/dL — ABNORMAL HIGH (ref 0–99)
Triglycerides: 96 mg/dL (ref 0–149)
VLDL CHOLESTEROL CAL: 19 mg/dL (ref 5–40)

## 2018-02-05 LAB — HEMOGLOBIN A1C
ESTIMATED AVERAGE GLUCOSE: 148 mg/dL
Hgb A1c MFr Bld: 6.8 % — ABNORMAL HIGH (ref 4.8–5.6)

## 2018-03-04 ENCOUNTER — Ambulatory Visit: Payer: Self-pay | Admitting: Urology

## 2018-03-04 VITALS — BP 153/92 | HR 86 | Temp 98.3°F | Ht 72.5 in | Wt 243.0 lb

## 2018-03-04 DIAGNOSIS — E119 Type 2 diabetes mellitus without complications: Secondary | ICD-10-CM

## 2018-03-04 MED ORDER — LISINOPRIL 20 MG PO TABS: 20.0000 mg | ORAL_TABLET | Freq: Every day | ORAL | 1 refills | Status: DC

## 2018-03-04 MED ORDER — METFORMIN HCL 1000 MG PO TABS: 500.0000 mg | ORAL_TABLET | Freq: Every day | ORAL | 1 refills | Status: DC

## 2018-03-04 NOTE — Progress Notes (Signed)
  Patient: Frederick Perry Male    DOB: 1956-04-25   62 y.o.   MRN: [id number] Visit Date: 03/04/2018  Today's Provider: Zara Council, PA-C   Chief Complaint  Patient presents with  . Follow-up    lab work follow up    Subjective:    HPI Patient with a history of bladder cancer, DM, HTN and HLD who presents today for follow up.  He had a resection in 03/2016 for pTa bladder cancer and needed to return for repeat TURBT    No Known Allergies Previous Medications   CIPROFLOXACIN (CIPRO) 500 MG TABLET    Take 1 tablet (500 mg total) by mouth 2 (two) times daily.   LISINOPRIL (PRINIVIL,ZESTRIL) 20 MG TABLET    Take 1 tablet (20 mg total) by mouth daily.   METFORMIN (GLUCOPHAGE) 1000 MG TABLET    Take 0.5 tablets (500 mg total) by mouth daily with breakfast.   NAPROXEN SODIUM (ALEVE) 220 MG CAPS    Take 1 capsule by mouth every 12 (twelve) hours as needed.    Review of Systems  Social History   Tobacco Use  . Smoking status: Current Every Day Smoker    Packs/day: 0.50    Years: 40.00    Pack years: 20.00    Types: Cigarettes  . Smokeless tobacco: Never Used  Substance Use Topics  . Alcohol use: Yes    Alcohol/week: 10.0 standard drinks    Types: 10 Cans of beer per week    Comment: daily- beers and occasionally liquor   Objective:   BP (!) 153/92   Pulse 86   Temp 98.3 F (36.8 C)   Ht 6' 0.5" (1.842 m)   Wt 243 lb (110.2 kg)   BMI 32.50 kg/m   Physical Exam  Constitutional: He is oriented to person, place, and time. He appears well-developed and well-nourished.  Eyes: Pupils are equal, round, and reactive to light. EOM are normal.  Neck: Normal range of motion.  Pulmonary/Chest: Effort normal.  Musculoskeletal: Normal range of motion.  Neurological: He is alert and oriented to person, place, and time.  Skin: Skin is warm and dry.        Assessment & Plan:     1. History of bladder cancer  Needs repeat cysto/TURBT  Will need to arrange [facility] care  and appointment with [facility]  Denies any gross hematuria continues to smoke   2. DM  hgb 6.8%  Metformin refilled  3. HTN  BP not at goal 153/92  Continue medication      Zara Council, PA-C   [facility]

## 2018-03-05 ENCOUNTER — Telehealth: Payer: Self-pay | Admitting: Urology

## 2018-03-05 NOTE — Telephone Encounter (Signed)
Please call this patient and get him in ASAP.  He is an [facility] patient I saw last night that Dr. Pilar Jarvis removed a bladder cancer in 03/2016.  He was supposed to come back for a repeat TURBT, but he did not follow up with [place] or any other urologist.

## 2018-03-05 NOTE — Telephone Encounter (Signed)
Left a message for the patient to call back to schedule an appointment.  Sharyn Lull

## 2018-03-08 NOTE — Telephone Encounter (Signed)
Frederick Perry I tried him again today and left another message to call back. Sharyn Lull

## 2018-03-15 ENCOUNTER — Encounter: Payer: Self-pay | Admitting: Urology

## 2018-03-15 ENCOUNTER — Ambulatory Visit: Payer: Self-pay | Admitting: Pharmacy Technician

## 2018-03-15 NOTE — Telephone Encounter (Signed)
We have been trying to get in touch with him to get him in with one of our physicians as he has a bladder cancer that was not completely resected.   I have sent a certified letter as well.  If he come to the [facility], please stress to him the importance of following up with urology.

## 2018-03-15 NOTE — Progress Notes (Signed)
Certified letter sent March 15, 2018.

## 2018-03-16 ENCOUNTER — Telehealth: Payer: Self-pay | Admitting: Pharmacy Technician

## 2018-03-16 NOTE — Telephone Encounter (Signed)
I put in a flag to let the front desk and others know to have him call [facility] and included the number.  I hope that helps.

## 2018-03-16 NOTE — Telephone Encounter (Signed)
Patient approved to receive medication assistance at [facility] as long as eligibility criteria continues to be met.  [facility]

## 2018-04-01 ENCOUNTER — Other Ambulatory Visit: Payer: Self-pay

## 2018-04-01 DIAGNOSIS — E119 Type 2 diabetes mellitus without complications: Secondary | ICD-10-CM

## 2018-04-01 DIAGNOSIS — E785 Hyperlipidemia, unspecified: Secondary | ICD-10-CM

## 2018-04-01 MED ORDER — METFORMIN HCL 1000 MG PO TABS: 500.0000 mg | ORAL_TABLET | Freq: Every day | ORAL | 1 refills | Status: DC

## 2018-04-01 MED ORDER — LISINOPRIL 20 MG PO TABS: 20.0000 mg | ORAL_TABLET | Freq: Every day | ORAL | 1 refills | Status: DC

## 2018-04-27 ENCOUNTER — Telehealth: Payer: Self-pay | Admitting: Adult Health Nurse Practitioner

## 2018-04-27 NOTE — Telephone Encounter (Signed)
Called twice to let patient know that we would fax his A1C results over to his job. He did not pick up and we are not able to leave messages for him.

## 2018-05-27 ENCOUNTER — Other Ambulatory Visit: Payer: Self-pay

## 2018-06-03 ENCOUNTER — Ambulatory Visit: Payer: Self-pay

## 2018-07-04 ENCOUNTER — Other Ambulatory Visit: Payer: Self-pay | Admitting: Adult Health Nurse Practitioner

## 2018-07-04 DIAGNOSIS — E785 Hyperlipidemia, unspecified: Secondary | ICD-10-CM

## 2018-07-08 ENCOUNTER — Other Ambulatory Visit: Payer: Self-pay

## 2018-07-08 DIAGNOSIS — E785 Hyperlipidemia, unspecified: Secondary | ICD-10-CM

## 2018-07-08 MED ORDER — LISINOPRIL 20 MG PO TABS: 20.0000 mg | ORAL_TABLET | Freq: Every day | ORAL | 1 refills | Status: DC

## 2019-01-26 ENCOUNTER — Telehealth: Payer: Self-pay | Admitting: Pharmacy Technician

## 2019-01-26 NOTE — Telephone Encounter (Signed)
Patient failed to provide2020 financial documentation. No additional medication assistance will be provided by [facility] without the required proof of income documentation. Patient notified by letter.  Velda Shell, CPhT [facility]

## 2019-03-15 ENCOUNTER — Telehealth: Payer: Self-pay | Admitting: Gerontology

## 2019-03-15 NOTE — Telephone Encounter (Signed)
Called pt on 9/29 @2 :04 pm. Left vm regarding updating eligibility and scheduling f/u appt.

## 2019-03-16 ENCOUNTER — Telehealth: Payer: Self-pay

## 2019-03-16 NOTE — Telephone Encounter (Signed)
Called pt on 9/30 at 3:00pm and left vm about scheduling fu appt and updating eligibility

## 2019-07-12 ENCOUNTER — Emergency Department
Admission: EM | Admit: 2019-07-12 | Discharge: 2019-07-12 | Disposition: A | Discharge status: Home / Self Care | Payer: BC Managed Care – PPO | Attending: Student in an Organized Health Care Education/Training Program | Admitting: Student in an Organized Health Care Education/Training Program

## 2019-07-12 ENCOUNTER — Encounter: Payer: Self-pay | Admitting: Emergency Medicine

## 2019-07-12 ENCOUNTER — Emergency Department: Payer: BC Managed Care – PPO

## 2019-07-12 ENCOUNTER — Other Ambulatory Visit: Payer: Self-pay

## 2019-07-12 DIAGNOSIS — I10 Essential (primary) hypertension: Secondary | ICD-10-CM | POA: Insufficient documentation

## 2019-07-12 DIAGNOSIS — I252 Old myocardial infarction: Secondary | ICD-10-CM | POA: Insufficient documentation

## 2019-07-12 DIAGNOSIS — Z7901 Long term (current) use of anticoagulants: Secondary | ICD-10-CM | POA: Diagnosis not present

## 2019-07-12 DIAGNOSIS — I82419 Acute embolism and thrombosis of unspecified femoral vein: Secondary | ICD-10-CM

## 2019-07-12 DIAGNOSIS — R2243 Localized swelling, mass and lump, lower limb, bilateral: Secondary | ICD-10-CM | POA: Insufficient documentation

## 2019-07-12 DIAGNOSIS — F17210 Nicotine dependence, cigarettes, uncomplicated: Secondary | ICD-10-CM | POA: Insufficient documentation

## 2019-07-12 DIAGNOSIS — Z79899 Other long term (current) drug therapy: Secondary | ICD-10-CM | POA: Insufficient documentation

## 2019-07-12 DIAGNOSIS — E119 Type 2 diabetes mellitus without complications: Secondary | ICD-10-CM | POA: Diagnosis not present

## 2019-07-12 DIAGNOSIS — Z7984 Long term (current) use of oral hypoglycemic drugs: Secondary | ICD-10-CM | POA: Diagnosis not present

## 2019-07-12 DIAGNOSIS — M7989 Other specified soft tissue disorders: Secondary | ICD-10-CM

## 2019-07-12 LAB — BRAIN NATRIURETIC PEPTIDE: B Natriuretic Peptide: 720 pg/mL — ABNORMAL HIGH (ref 0.0–100.0)

## 2019-07-12 LAB — BASIC METABOLIC PANEL
Anion gap: 12 (ref 5–15)
BUN: 22 mg/dL (ref 8–23)
CO2: 25 mmol/L (ref 22–32)
Calcium: 8.6 mg/dL — ABNORMAL LOW (ref 8.9–10.3)
Chloride: 101 mmol/L (ref 98–111)
Creatinine, Ser: 1.56 mg/dL — ABNORMAL HIGH (ref 0.61–1.24)
GFR calc Af Amer: 54 mL/min — ABNORMAL LOW (ref >60)
GFR calc non Af Amer: 47 mL/min — ABNORMAL LOW (ref >60)
Glucose, Bld: 116 mg/dL — ABNORMAL HIGH (ref 70–99)
Potassium: 4.2 mmol/L (ref 3.5–5.1)
Sodium: 138 mmol/L (ref 135–145)

## 2019-07-12 LAB — CBC
HCT: 49.4 % (ref 39.0–52.0)
Hemoglobin: 16.2 g/dL (ref 13.0–17.0)
MCH: 32.9 pg (ref 26.0–34.0)
MCHC: 32.8 g/dL (ref 30.0–36.0)
MCV: 100.4 fL — ABNORMAL HIGH (ref 80.0–100.0)
Platelets: 202 10*3/uL (ref 150–400)
RBC: 4.92 MIL/uL (ref 4.22–5.81)
RDW: 15.4 % (ref 11.5–15.5)
WBC: 6.5 10*3/uL (ref 4.0–10.5)
nRBC: 0 % (ref 0.0–0.2)

## 2019-07-12 MED ORDER — APIXABAN 5 MG PO TABS: ORAL_TABLET | ORAL | 1 refills | Status: DC

## 2019-07-12 MED ORDER — APIXABAN 5 MG PO TABS
10.0000 mg | ORAL_TABLET | Freq: Two times a day (BID) | ORAL | Status: DC
  Administered 2019-07-12: 10 mg via ORAL
  Filled 2019-07-12: qty 2

## 2019-07-12 MED ORDER — FUROSEMIDE 40 MG PO TABS: 40.0000 mg | ORAL_TABLET | Freq: Every day | ORAL | 0 refills | Status: DC

## 2019-07-12 MED ORDER — FUROSEMIDE 10 MG/ML IJ SOLN
60.0000 mg | Freq: Once | INTRAMUSCULAR | Status: AC
  Administered 2019-07-12: 60 mg via INTRAVENOUS
  Filled 2019-07-12: qty 8

## 2019-07-12 NOTE — ED Provider Notes (Signed)
[facility] Emergency Department Provider Note    First MD Initiated Contact with Patient 07/12/19 1621     (approximate)  I have reviewed the triage vital signs and the nursing notes.   HISTORY  Chief Complaint Edema    HPI Frederick Perry is a 64 y.o. 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 Denies any chest pain or shortness of breath. States he is followed up at urgent care recently was started on 20 mg of Lasix but does not feel like it is working so he presented to the ER.  He denies any pain. Has not tried compression hose. Denies any nausea or vomiting.   - Left ventricle: The cavity size was normal. There was mild focal   basal hypertrophy of the septum. Systolic function was normal.   The estimated ejection fraction was in the range of 50% to 55%.   Hypokinesis of the lateral myocardium. Hypokinesis of the   inferior myocardium. Hypokinesis of the inferolateral myocardium.   Left ventricular diastolic function parameters were normal. - Mitral valve: There was mild to moderate regurgitation. - Left atrium: The atrium was mildly dilated. - Right ventricle: Systolic function was normal. - Pulmonary arteries: Systolic pressure was mildly elevated. PA   peak pressure: 42 mm Hg (S).   Past Medical History:  Diagnosis Date  . Diabetes mellitus without complication ([place])    Not on medications  . Hypertension    Not on medications.stopped since he ran out of medications  . Myocardial infarction ([place])   . Polysubstance abuse ([place])    a. ongoing tobacco and alcohol abuse   . Pulmonary embolism ([facility])    Family History  Problem Relation Age of Onset  . Congestive Heart Failure Mother   . Peripheral vascular disease Father    Past Surgical History:  Procedure Laterality Date  . BACK SURGERY    . CARDIAC CATHETERIZATION N/A 09/05/2015   Procedure: Left Heart Cath and Coronary Angiography;  Surgeon: Minna Merritts, MD;  Location: [place] [facility];  Service: Cardiovascular;  Laterality: N/A;  . DRAIN REMOVAL Right 12/20/2015   Procedure: DRAIN REMOVAL;  Surgeon: Nickie Retort, MD;  Location: [facility] OR[facility];  Service: Urology;  Laterality: Right;  by dr copper  . LAPAROTOMY N/A 12/14/2015   Procedure: EXPLORATORY LAPAROTOMY, prepyloric gastric ulcer biopsy;  Surgeon: Jules Husbands, MD;  Location: [facility];  Service: General;  Laterality: N/A;  . Patellar tendon repair Right   . PERIPHERAL VASCULAR CATHETERIZATION N/A 09/07/2015   Procedure: IVC Filter Insertion;  Surgeon: Algernon Huxley, MD;  Location: [place] [facility];  Service: Cardiovascular;  Laterality: N/A;  . Testicular torsion repair    . TRANSURETHRAL RESECTION OF BLADDER TUMOR N/A 12/20/2015   Procedure: TRANSURETHRAL RESECTION OF BLADDER TUMOR (TURBT);  Surgeon: Nickie Retort, MD;  Location: [facility];  Service: Urology;  Laterality: N/A;  . TRANSURETHRAL RESECTION OF BLADDER TUMOR N/A 03/26/2016   Procedure: TRANSURETHRAL RESECTION OF BLADDER TUMOR (TURBT);  Surgeon: Nickie Retort, MD;  Location: [facility];  Service: Urology;  Laterality: N/A;   Patient Active Problem List   Diagnosis Date Noted  . Diabetes mellitus without complication ([place]) 99991111  . Bladder cancer ([place]) 03/26/2016  . Perforated bowel ([place]) 12/14/2015  . Perforation bowel ([place])   . Iron deficiency anemia due to chronic blood loss 09/07/2015  . Weight loss   .  NSTEMI (non-ST elevated myocardial infarction) ([place]) 09/05/2015  . Absolute anemia   . Ischemic chest pain ([place])   . Uncontrolled type 2 diabetes mellitus with circulatory disorder ([place])   . Hyperlipidemia       Prior to Admission medications   Medication Sig Start Date End Date Taking? Authorizing Provider  apixaban (ELIQUIS) 5 MG TABS tablet Take 2 tablets (10mg ) twice daily for 7 days, then 1 tablet (5mg ) twice daily  07/12/19   Merlyn Lot, MD  ciprofloxacin (CIPRO) 500 MG tablet Take 1 tablet (500 mg total) by mouth 2 (two) times daily. Patient not taking: Reported on 02/04/2018 05/19/16   Nickie Retort, MD  furosemide (LASIX) 40 MG tablet Take 1 tablet (40 mg total) by mouth daily. 07/12/19 07/11/20  Merlyn Lot, MD  lisinopril (PRINIVIL,ZESTRIL) 20 MG tablet Take 1 tablet (20 mg total) by mouth daily. 07/08/18   Doles-Johnson, Teah, NP  metFORMIN (GLUCOPHAGE) 1000 MG tablet Take 0.5 tablets (500 mg total) by mouth daily with breakfast. 04/01/18   Doles-Johnson, Teah, NP    Allergies Patient has no known allergies.    Social History Social History   Tobacco Use  . Smoking status: Current Every Day Smoker    Packs/day: 0.50    Years: 40.00    Pack years: 20.00    Types: Cigarettes  . Smokeless tobacco: Never Used  Substance Use Topics  . Alcohol use: Yes    Alcohol/week: 10.0 standard drinks    Types: 10 Cans of beer per week    Comment: daily- beers and occasionally liquor  . Drug use: No    Review of Systems Patient denies headaches, rhinorrhea, blurry vision, numbness, shortness of breath, chest pain, edema, cough, abdominal pain, nausea, vomiting, diarrhea, dysuria, fevers, rashes or hallucinations unless otherwise stated above in HPI. ____________________________________________   PHYSICAL EXAM:  VITAL SIGNS: Vitals:   07/12/19 1430 07/12/19 1824  BP: (!) 146/93 (!) 149/105  Pulse: 92 94  Resp: 18 20  Temp: 98.5 F (36.9 C) (!) 97.5 F (36.4 C)  SpO2: 96% 95%    Constitutional: Alert and oriented.  Eyes: Conjunctivae are normal.  Head: Atraumatic. Nose: No congestion/rhinnorhea. Mouth/Throat: Mucous membranes are moist.   Neck: No stridor. Painless ROM.  Cardiovascular: Normal rate, regular rhythm. Grossly normal heart sounds.  Good peripheral circulation. Respiratory: Normal respiratory effort.  No retractions. Lungs CTAB. Gastrointestinal: Soft and  nontender. No distention. No abdominal bruits. No CVA tenderness. Genitourinary:  Musculoskeletal: No lower extremity tenderness. 2+ BLE pitting edema.  No joint effusions. Neurologic:  Normal speech and language. No gross focal neurologic deficits are appreciated. No facial droop Skin:  Skin is warm, dry and intact. No rash noted. Psychiatric: Mood and affect are normal. Speech and behavior are normal.  ____________________________________________   LABS (all labs ordered are listed, but only abnormal results are displayed)  Results for orders placed or performed during the hospital encounter of 07/12/19 (from the past 24 hour(s))  Basic metabolic panel     Status: Abnormal   Collection Time: 07/12/19  2:21 PM  Result Value Ref Range   Sodium 138 135 - 145 mmol/L   Potassium 4.2 3.5 - 5.1 mmol/L   Chloride 101 98 - 111 mmol/L   CO2 25 22 - 32 mmol/L   Glucose, Bld 116 (H) 70 - 99 mg/dL   BUN 22 8 - 23 mg/dL   Creatinine, Ser 1.56 (H) 0.61 - 1.24 mg/dL   Calcium 8.6 (L) 8.9 - 10.3 mg/dL  GFR calc non Af Amer 47 (L) >60 mL/min   GFR calc Af Amer 54 (L) >60 mL/min   Anion gap 12 5 - 15  CBC     Status: Abnormal   Collection Time: 07/12/19  2:21 PM  Result Value Ref Range   WBC 6.5 4.0 - 10.5 K/uL   RBC 4.92 4.22 - 5.81 MIL/uL   Hemoglobin 16.2 13.0 - 17.0 g/dL   HCT 49.4 39.0 - 52.0 %   MCV 100.4 (H) 80.0 - 100.0 fL   MCH 32.9 26.0 - 34.0 pg   MCHC 32.8 30.0 - 36.0 g/dL   RDW 15.4 11.5 - 15.5 %   Platelets 202 150 - 400 K/uL   nRBC 0.0 0.0 - 0.2 %  Brain natriuretic peptide     Status: Abnormal   Collection Time: 07/12/19  2:21 PM  Result Value Ref Range   B Natriuretic Peptide 720.0 (H) 0.0 - 100.0 pg/mL   ____________________________________________   ____________________________________________  RADIOLOGY  I personally reviewed all radiographic images ordered to evaluate for the above acute complaints and reviewed radiology reports and findings.  These findings  were personally discussed with the patient.  Please see medical record for radiology report.  ____________________________________________   PROCEDURES  Procedure(s) performed:  Procedures    Critical Care performed: no ____________________________________________   INITIAL IMPRESSION / ASSESSMENT AND PLAN / ED COURSE  Pertinent labs & imaging results that were available during my care of the patient were reviewed by me and considered in my medical decision making (see chart for details).   DDX: chf, edema, lymphedema, dvt, anasarca  TAVION SEELIGER is a 65 y.o. who presents to the ED with symptoms as described above.  Patient relatively well-appearing but does have significant edema.  No hypoxia.  Does have what appears to be some component of chronic CHF not having any improvement or results with 20 mg of Lasix therefore IV Lasix was given.  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  Do feel patient should be placed back on anticoagulation.  Does not have any signs or symptoms of ACS PE or significant pulmonary edema.  He is having adequate diuresis after IV dose.  At this point he believe he is appropriate for close outpatient follow-up have been given referral to heart failure clinic.  Discussed with the patient and all questioned fully answered. He will call me if any problems arise.      The patient was evaluated in Emergency Department today for the symptoms described in the history of present illness. He/she was evaluated in the context of the global COVID-19 pandemic, which necessitated consideration that the patient might be at risk for infection with the SARS-CoV-2 virus that causes COVID-19. Institutional protocols and algorithms that pertain to the evaluation of patients at risk for COVID-19 are in a state of rapid change based on information released by regulatory bodies including the CDC  and federal and state organizations. These policies and algorithms were followed during the patient's care in the ED.  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     Note:  This document was prepared using Dragon voice recognition software and may include unintentional dictation errors.    Merlyn Lot, MD 07/12/19 [phone]

## 2019-07-12 NOTE — ED Notes (Signed)
US at bedside

## 2019-07-12 NOTE — ED Triage Notes (Signed)
Pt reports swelling in both legs for awhile. Pt reports UC put him on lasix but it is not working. Pt reports does not have a primary care MD

## 2019-07-15 ENCOUNTER — Encounter: Payer: Self-pay | Admitting: Family

## 2019-07-15 ENCOUNTER — Ambulatory Visit (INDEPENDENT_AMBULATORY_CARE_PROVIDER_SITE_OTHER): Payer: BC Managed Care – PPO | Admitting: Cardiology

## 2019-07-15 ENCOUNTER — Encounter: Payer: Self-pay | Admitting: Cardiology

## 2019-07-15 ENCOUNTER — Other Ambulatory Visit: Payer: Self-pay

## 2019-07-15 ENCOUNTER — Ambulatory Visit: Payer: BC Managed Care – PPO | Attending: Family | Admitting: Family

## 2019-07-15 VITALS — BP 150/92 | HR 98 | Ht 73.0 in | Wt 293.0 lb

## 2019-07-15 VITALS — BP 150/78 | HR 105 | Resp 18 | Ht 73.0 in | Wt 296.2 lb

## 2019-07-15 DIAGNOSIS — I252 Old myocardial infarction: Secondary | ICD-10-CM | POA: Diagnosis not present

## 2019-07-15 DIAGNOSIS — F17200 Nicotine dependence, unspecified, uncomplicated: Secondary | ICD-10-CM

## 2019-07-15 DIAGNOSIS — F17210 Nicotine dependence, cigarettes, uncomplicated: Secondary | ICD-10-CM | POA: Diagnosis not present

## 2019-07-15 DIAGNOSIS — I110 Hypertensive heart disease with heart failure: Secondary | ICD-10-CM | POA: Insufficient documentation

## 2019-07-15 DIAGNOSIS — I10 Essential (primary) hypertension: Secondary | ICD-10-CM

## 2019-07-15 DIAGNOSIS — Z79899 Other long term (current) drug therapy: Secondary | ICD-10-CM | POA: Insufficient documentation

## 2019-07-15 DIAGNOSIS — R600 Localized edema: Secondary | ICD-10-CM

## 2019-07-15 DIAGNOSIS — I890 Lymphedema, not elsewhere classified: Secondary | ICD-10-CM | POA: Diagnosis not present

## 2019-07-15 DIAGNOSIS — I5032 Chronic diastolic (congestive) heart failure: Secondary | ICD-10-CM | POA: Insufficient documentation

## 2019-07-15 DIAGNOSIS — E119 Type 2 diabetes mellitus without complications: Secondary | ICD-10-CM | POA: Insufficient documentation

## 2019-07-15 DIAGNOSIS — Z86711 Personal history of pulmonary embolism: Secondary | ICD-10-CM | POA: Diagnosis not present

## 2019-07-15 DIAGNOSIS — I82411 Acute embolism and thrombosis of right femoral vein: Secondary | ICD-10-CM

## 2019-07-15 DIAGNOSIS — Z86718 Personal history of other venous thrombosis and embolism: Secondary | ICD-10-CM

## 2019-07-15 DIAGNOSIS — Z7901 Long term (current) use of anticoagulants: Secondary | ICD-10-CM | POA: Insufficient documentation

## 2019-07-15 DIAGNOSIS — I2510 Atherosclerotic heart disease of native coronary artery without angina pectoris: Secondary | ICD-10-CM

## 2019-07-15 DIAGNOSIS — Z720 Tobacco use: Secondary | ICD-10-CM

## 2019-07-15 DIAGNOSIS — Z8249 Family history of ischemic heart disease and other diseases of the circulatory system: Secondary | ICD-10-CM | POA: Diagnosis not present

## 2019-07-15 DIAGNOSIS — E785 Hyperlipidemia, unspecified: Secondary | ICD-10-CM

## 2019-07-15 MED ORDER — TORSEMIDE 20 MG PO TABS: 40.0000 mg | ORAL_TABLET | Freq: Two times a day (BID) | ORAL | 0 refills | Status: DC

## 2019-07-15 NOTE — Patient Instructions (Signed)
Medication Instructions:  Your physician has recommended you make the following change in your medication:   1) STOP Furosemide (Lasix)  2) START Torsemide 40 mg twice daily. An Rx has been sent to your pharmacy.  *If you need a refill on your cardiac medications before your next appointment, please call your pharmacy*  Lab Work: Your physician recommends that you return for a FASTING lipid profile and bmet on 07/21/19.  Please have your lab drawn at the [facility] you do not need an appointment.  If you have labs (blood work) drawn today and your tests are completely normal, you will receive your results only by: Marland Kitchen MyChart Message (if you have MyChart) OR . A paper copy in the mail If you have any lab test that is abnormal or we need to change your treatment, we will call you to review the results.  Testing/Procedures: None ordered  Follow-Up: At [facility], you and your health needs are our priority.  As part of our continuing mission to provide you with exceptional heart care, we have created designated Provider Care Teams.  These Care Teams include your primary Cardiologist (physician) and Advanced Practice Providers (APPs -  Physician Assistants and Nurse Practitioners) who all work together to provide you with the care you need, when you need it.  Your next appointment:   07/22/19   The format for your next appointment:   In Person  Provider:    You may see Kate Sable, MD or one of the following Advanced Practice Providers on your designated Care Team:    Murray Hodgkins, NP  Christell Faith, PA-C  Marrianne Mood, PA-C   Other Instructions N/A

## 2019-07-15 NOTE — Patient Instructions (Addendum)
Continue weighing daily and call for an overnight weight gain of > 2 pounds or a weekly weight gain of >5 pounds.  Increase furosemide to 40mg  twice daily.

## 2019-07-15 NOTE — Progress Notes (Signed)
Patient ID: Frederick Perry, male    DOB: [birth year], 64 y.o.   MRN: [id number]  HPI  Frederick Perry is a 64 y/o male with a history of DM, HTN, MI, pulmonary embolus, current tobacco use and chronic heart failure.   Echo report from 09/05/15 reviewed and showed an EF of 50-55% along with mildly elevated PA pressure and moderate Frederick.   Cardiac catheterization done 09/05/15 but unable to view results.   Was in the ED 07/02/19 due to pedal edema. IV lasix given. 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   He presents today for his initial visit with a chief complaint of minimal shortness of breath upon moderate exertion. He describes this as having been present for several months. He has associated pedal edema, abdominal distention, weight gain and difficulty sleeping along with this. He denies any palpitations, chest pain, wheezing, cough, fatigue or dizziness.   Was recently put on 40mg  furosemide at ED visit and says that his pedal edema is a little better. Does drive a truck and admits that he eats fast food often because of its convenience although he does try to eat lower sodium items if he can.   Past Medical History:  Diagnosis Date  . CHF (congestive heart failure) ([place])   . Diabetes mellitus without complication ([place])    Not on medications  . Hypertension    Not on medications.stopped since he ran out of medications  . Myocardial infarction ([place])   . Polysubstance abuse ([place])    a. ongoing tobacco and alcohol abuse   . Pulmonary embolism [facility])    Past Surgical History:  Procedure Laterality Date  . BACK SURGERY    . CARDIAC CATHETERIZATION N/A 09/05/2015   Procedure: Left Heart Cath and Coronary Angiography;  Surgeon: Minna Merritts, MD;  Location: [place] [facility];  Service: Cardiovascular;  Laterality: N/A;  . DRAIN REMOVAL Right 12/20/2015   Procedure: DRAIN REMOVAL;   Surgeon: Nickie Retort, MD;  Location: [facility];  Service: Urology;  Laterality: Right;  by dr copper  . LAPAROTOMY N/A 12/14/2015   Procedure: EXPLORATORY LAPAROTOMY, prepyloric gastric ulcer biopsy;  Surgeon: Jules Husbands, MD;  Location: [facility];  Service: General;  Laterality: N/A;  . Patellar tendon repair Right   . PERIPHERAL VASCULAR CATHETERIZATION N/A 09/07/2015   Procedure: IVC Filter Insertion;  Surgeon: Algernon Huxley, MD;  Location: [place] [facility];  Service: Cardiovascular;  Laterality: N/A;  . Testicular torsion repair    . TRANSURETHRAL RESECTION OF BLADDER TUMOR N/A 12/20/2015   Procedure: TRANSURETHRAL RESECTION OF BLADDER TUMOR (TURBT);  Surgeon: Nickie Retort, MD;  Location: [facility];  Service: Urology;  Laterality: N/A;  . TRANSURETHRAL RESECTION OF BLADDER TUMOR N/A 03/26/2016   Procedure: TRANSURETHRAL RESECTION OF BLADDER TUMOR (TURBT);  Surgeon: Nickie Retort, MD;  Location: [facility];  Service: Urology;  Laterality: N/A;   Family History  Problem Relation Age of Onset  . Congestive Heart Failure Mother   . Peripheral vascular disease Father    Social History   Tobacco Use  . Smoking status: Current Every Day Smoker    Packs/day: 0.50    Years: 40.00    Pack years: 20.00    Types: Cigarettes  . Smokeless tobacco: Never Used  Substance Use Topics  . Alcohol use: Yes    Alcohol/week: 10.0 standard drinks  Types: 10 Cans of beer per week    Comment: daily- beers and occasionally liquor   No Known Allergies Prior to Admission medications   Medication Sig Start Date End Date Taking? Authorizing Provider  apixaban (ELIQUIS) 5 MG TABS tablet Take 2 tablets (10mg ) twice daily for 7 days, then 1 tablet (5mg ) twice daily 07/12/19  Yes Merlyn Lot, MD  furosemide (LASIX) 40 MG tablet Take 1 tablet (40 mg total) by mouth daily. 07/12/19 07/11/20 Yes Merlyn Lot, MD  lisinopril (PRINIVIL,ZESTRIL) 20 MG tablet Take 1 tablet (20 mg total) by  mouth daily. 07/08/18  Yes Doles-Johnson, Teah, NP     Review of Systems  Constitutional: Negative for appetite change and fatigue.  HENT: Positive for rhinorrhea. Negative for congestion and sore throat.   Eyes: Negative.   Respiratory: Positive for shortness of breath (with moderate exertion). Negative for cough and wheezing.   Cardiovascular: Positive for leg swelling (improving). Negative for chest pain and palpitations.  Gastrointestinal: Positive for abdominal distention (improving). Negative for abdominal pain.  Endocrine: Negative.   Genitourinary: Negative.   Musculoskeletal: Negative for back pain and neck pain.  Skin: Positive for wound (left lower leg).  Allergic/Immunologic: Negative.   Neurological: Negative for dizziness and light-headedness.  Hematological: Negative for adenopathy. Does not bruise/bleed easily.  Psychiatric/Behavioral: Positive for sleep disturbance (sleeping on 3 pillows). Negative for dysphoric mood. The patient is not nervous/anxious.    Vitals:   07/15/19 1113 07/15/19 1134  BP: (!) 159/112 (!) 150/78  Pulse: (!) 105   Resp: 18   SpO2: 96%   Weight: 296 lb 3.2 oz (134.4 kg)   Height: 6\' 1"  (1.854 m)    Wt Readings from Last 3 Encounters:  07/15/19 296 lb 3.2 oz (134.4 kg)  07/12/19 290 lb (131.5 kg)  03/04/18 243 lb (110.2 kg)   Lab Results  Component Value Date   CREATININE 1.56 (H) 07/12/2019   CREATININE 1.18 02/04/2018   CREATININE 1.12 05/06/2016    Physical Exam Vitals and nursing note reviewed.  Constitutional:      Appearance: He is well-developed.  HENT:     Head: Normocephalic and atraumatic.  Cardiovascular:     Rate and Rhythm: Regular rhythm. Tachycardia present.  Pulmonary:     Effort: Pulmonary effort is normal. No respiratory distress.     Breath sounds: No wheezing or rales.  Abdominal:     Palpations: Abdomen is soft.     Tenderness: There is no abdominal tenderness.     Comments: Distended  Musculoskeletal:      Cervical back: Normal range of motion and neck supple.     Right lower leg: No tenderness. Edema (2+ pitting to thigh) present.     Left lower leg: No tenderness. Edema (2+ pitting to thigh) present.  Skin:    General: Skin is warm and dry.  Neurological:     General: No focal deficit present.     Mental Status: He is alert and oriented to person, place, and time.  Psychiatric:        Mood and Affect: Mood normal.        Behavior: Behavior normal.     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 but encouraged him to weigh and call for an overnight weight gain of >2 pounds or a weekly weight  gain of >5 pounds - eats fast food often due to his job as a Administrator - discussed increasing furosemide or changing his diuretic to torsemide - since patient has furosemide will increase furosemide to 40mg  BID; currently not on potassium and recent K+ is 4.2 - has cardiology appt later today & advised patient that different recommendation could be made regarding diuretic use - BNP 07/12/19 was 720.0 - needs updated echo & this was scheduled for 08/03/19 (soonest available)  2: HTN- - BP elevated but he says that he had to walk "a mile" from where he parked; slightly better upon recheck - increasing furosemide per above - BMP 07/12/19 reviewed and showed sodium 138, potassium 4.2, creatinine 1.56 and GFR 54  3: DVT- - recently diagnosed and is on apixaban - has had previous IVC filter placed  4: Tobacco use- - smokes 1/2 ppd of cigarettes - drinks beer but unable to quantify but says that it's "a couple" especially when watching ball games  5: Lymphedema- - to get compression socks and apply them daily with removal at bedtime - elevate legs when sitting for long periods of time - works as a Administrator so doesn't get  much time to exercise - consider lymphapress compression boots if edema persists  Medication bottles were reviewed.   Return in 3 weeks when echo is done, sooner if needed.

## 2019-07-15 NOTE — Progress Notes (Signed)
Cardiology Office Note:    Date:  07/15/2019   ID:  Frederick Perry, DOB 1955-12-29, MRN [id number]  PCP:  No primary care provider on file.  Cardiologist:  Kate Sable, MD  Electrophysiologist:  None   Referring MD: No ref. provider found   Chief Complaint  Patient presents with  . Other    ED follow up for DVT. Patient denies chest pain and SOB at this time. Meds reviewed verbally with patient.     History of Present Illness:    Frederick Perry is a 64 y.o. male with a hx of hypertension, diabetes, smoker, DVT, PE status post IVC filter placement in 2017 who presents due to lower extremity edema.  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  He was seen at an urgent care and started on 20 mg of Lasix p.o. but symptoms persisted.  Patient was seen in the emergency room on 07/11/2018.  Ultrasound of the lower extremity showed DVT in the mid right femoral vein.  No evidence of DVT within the left lower extremity.  Patient was started on Eliquis, Lasix increased to 40 mg daily.  He was evaluated in the heart failure clinic earlier today where Lasix was increased to 40 mg twice daily.  Echocardiogram was ordered and scheduled to be performed in 2 weeks on 08/03/2019.  Hemoglobin in the emergency room was 16.2.  Patient states his dry weight is around 145 pounds.  He denies dyspnea, chest pain but endorses 3 pillow orthopnea and worsening lower extremity edema.   Patient was seen in the [place] back in 08/2015 for chest pain.  Echocardiogram at the time showed EF of 50 to [id number], diastolic function was normal.  Left heart cath showed occluded OM 2 vessel and occluded PDA both being filled via collaterals.  Moderate disease noted in the mid LAD.  Medical management was recommended.  He was subsequently diagnosed with DVT and PE on that admission in 08/2015, had an IVC filter placed since patient was anemic with hemoglobin of 4.9 at the time.  Bleeding  was secondary to hematuria from a bladder mass that was subsequently resected.  Does not have her primary care provider due to lack of insurance in the past.  He recently got insurance with his new job and is working on getting a primary care provider.  Past Medical History:  Diagnosis Date  . CHF (congestive heart failure) ([place])   . Coronary artery disease    LHC 2017- occ OM2, occ PDA (both being filled via collaterals), moderate disease in LAD  . Diabetes mellitus without complication ([place])    Not on medications  . Hypertension    Not on medications.stopped since he ran out of medications  . Myocardial infarction ([place])   . Polysubstance abuse ([place])    a. ongoing tobacco and alcohol abuse   . Pulmonary embolism [facility])     Past Surgical History:  Procedure Laterality Date  . BACK SURGERY    . CARDIAC CATHETERIZATION N/A 09/05/2015   Procedure: Left Heart Cath and Coronary Angiography;  Surgeon: Minna Merritts, MD;  Location: [place] [facility];  Service: Cardiovascular;  Laterality: N/A;  . DRAIN REMOVAL Right 12/20/2015   Procedure: DRAIN REMOVAL;  Surgeon: Nickie Retort, MD;  Location: [facility] ORS;  Service: Urology;  Laterality: Right;  by dr copper  . LAPAROTOMY N/A 12/14/2015   Procedure: EXPLORATORY LAPAROTOMY, prepyloric gastric ulcer biopsy;  Surgeon: Jules Husbands, MD;  Location: [facility] ORS;  Service: General;  Laterality: N/A;  . Patellar tendon repair Right   . PERIPHERAL VASCULAR CATHETERIZATION N/A 09/07/2015   Procedure: IVC Filter Insertion;  Surgeon: Algernon Huxley, MD;  Location: [place] CV LAB;  Service: Cardiovascular;  Laterality: N/A;  . Testicular torsion repair    . TRANSURETHRAL RESECTION OF BLADDER TUMOR N/A 12/20/2015   Procedure: TRANSURETHRAL RESECTION OF BLADDER TUMOR (TURBT);  Surgeon: Nickie Retort, MD;  Location: [facility] ORS;  Service: Urology;  Laterality: N/A;  . TRANSURETHRAL RESECTION OF BLADDER TUMOR N/A 03/26/2016   Procedure: TRANSURETHRAL  RESECTION OF BLADDER TUMOR (TURBT);  Surgeon: Nickie Retort, MD;  Location: [facility] ORS;  Service: Urology;  Laterality: N/A;    Current Medications: Current Meds  Medication Sig  . apixaban (ELIQUIS) 5 MG TABS tablet Take 2 tablets (10mg ) twice daily for 7 days, then 1 tablet (5mg ) twice daily  . lisinopril (PRINIVIL,ZESTRIL) 20 MG tablet Take 1 tablet (20 mg total) by mouth daily.  . [DISCONTINUED] furosemide (LASIX) 40 MG tablet Take 40 mg by mouth 2 (two) times daily.      Allergies:   Patient has no known allergies.   Social History   Socioeconomic History  . Marital status: Divorced    Spouse name: Not on file  . Number of children: Not on file  . Years of education: Not on file  . Highest education level: Not on file  Occupational History  . Not on file  Tobacco Use  . Smoking status: Current Every Day Smoker    Packs/day: 0.50    Years: 40.00    Pack years: 20.00    Types: Cigarettes  . Smokeless tobacco: Never Used  Substance and Sexual Activity  . Alcohol use: Yes    Alcohol/week: 10.0 standard drinks    Types: 10 Cans of beer per week    Comment: daily- beers and occasionally liquor  . Drug use: No  . Sexual activity: Not on file  Other Topics Concern  . Not on file  Social History Narrative   Lives at home with Mother and is his Mothers primary caregiver   Social Determinants of Health   Financial Resource Strain:   . Difficulty of Paying Living Expenses: Not on file  Food Insecurity:   . Worried About Charity fundraiser in the Last Year: Not on file  . Ran Out of Food in the Last Year: Not on file  Transportation Needs:   . Lack of Transportation (Medical): Not on file  . Lack of Transportation (Non-Medical): Not on file  Physical Activity:   . Days of Exercise per Week: Not on file  . Minutes of Exercise per Session: Not on file  Stress:   . Feeling of Stress : Not on file  Social Connections:   . Frequency of Communication with Friends and  Family: Not on file  . Frequency of Social Gatherings with Friends and Family: Not on file  . Attends Religious Services: Not on file  . Active Member of Clubs or Organizations: Not on file  . Attends Archivist Meetings: Not on file  . Marital Status: Not on file     Family History: The patient's family history includes Congestive Heart Failure in his mother; Peripheral vascular disease in his father.  ROS:   Please see the history of present illness.     All other systems reviewed and are negative.  EKGs/Labs/Other Studies Reviewed:    The following studies  were reviewed today: TTE 2017 Left ventricle: The cavity size was normal. There was mild focal  basal hypertrophy of the septum. Systolic function was normal.  The estimated ejection fraction was in the range of 50% to 55%.  Hypokinesis of the lateral myocardium. Hypokinesis of the  inferior myocardium. Hypokinesis of the inferolateral myocardium.  Left ventricular diastolic function parameters were normal.  - Mitral valve: There was mild to moderate regurgitation.  - Left atrium: The atrium was mildly dilated.  - Right ventricle: Systolic function was normal.  - Pulmonary arteries: Systolic pressure was mildly elevated. PA  peak pressure: 42 mm Hg (S).   LHC 2017 Final Conclusions:   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 Fletcher Anon. Given profound anemia, patient currently not having chest pain, event likely happening more than 2 days ago (that is when chest pain symptoms presented), troponins already peaked, also OM 2 and you relatively small to moderate size vessel, recommended medical management  EKG:  EKG is  ordered today.  The ekg ordered today demonstrates sinus rhythm, consecutive PVCs.  Recent Labs: 07/12/2019: B Natriuretic Peptide 720.0; BUN 22; Creatinine, Ser 1.56;  Hemoglobin 16.2; Platelets 202; Potassium 4.2; Sodium 138  Recent Lipid Panel    Component Value Date/Time   CHOL 179 02/04/2018 1944   TRIG 96 02/04/2018 1944   HDL 48 02/04/2018 1944   CHOLHDL 3.7 02/04/2018 1944   CHOLHDL 4.3 09/05/2015 1730   VLDL 22 09/05/2015 1730   LDLCALC 112 (H) 02/04/2018 1944    Physical Exam:    VS:  BP (!) 150/92 (BP Location: Left Arm, Patient Position: Sitting, Cuff Size: Normal)   Pulse 98   Ht 6\' 1"  (1.854 m)   Wt 293 lb (132.9 kg)   SpO2 92%   BMI 38.66 kg/m     Wt Readings from Last 3 Encounters:  07/15/19 293 lb (132.9 kg)  07/15/19 296 lb 3.2 oz (134.4 kg)  07/12/19 290 lb (131.5 kg)     GEN:  Well nourished, well developed in no acute distress HEENT: Normal NECK: No JVD; No carotid bruits LYMPHATICS: No lymphadenopathy CARDIAC: RRR, no murmurs, rubs, gallops RESPIRATORY:  Clear to auscultation anteriorly, decreased at bases. ABDOMEN: Soft, non-tender, distended MUSCULOSKELETAL:  2+ edema to thighs; No deformity  SKIN: Warm and dry NEUROLOGIC:  Alert and oriented x 3 PSYCHIATRIC:  Normal affect   ASSESSMENT:    1. Bilateral leg edema   2. Coronary artery disease involving native coronary artery of native heart without angina pectoris   3. Smoking   4. History of DVT of lower extremity   5. Hyperlipidemia, unspecified hyperlipidemia type    PLAN:    In order of problems listed above:  1. Patient with worsening lower extremity edema.  His measured weight in September of 2019 was 243 pounds.  He is currently 50 pounds overweight.  We will switch Lasix to torsemide 40 mg twice daily due to better GI absorption.  Patient encouraged to get a scale and measure weights daily.  Echocardiogram is scheduled for 2 weeks.  He was encouraged to keep that appointment.  We will get a BMP 1 day before his follow-up visit with me next week. 2. History of CAD with occlusion of OM2 and PDA.  Continue aspirin 81 mg.  Will determine dose of statin  based on lipid panel results. 3. Smoking  cessation advised.  Over 5 minutes spent. 4. History of DVT.  Patient on Eliquis.  He may need to follow-up with heme-onc due to second episode of DVT coagulopathy work-up.  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 5. History of CAD, get lipid panel.  Follow-up in 1 week.  This note was generated in part or whole with voice recognition software. Voice recognition is usually quite accurate but there are transcription errors that can and very often do occur. I apologize for any typographical errors that were not detected and corrected.  Medication Adjustments/Labs and Tests Ordered: Current medicines are reviewed at length with the patient today.  Concerns regarding medicines are outlined above.  Orders Placed This Encounter  Procedures  . Basic metabolic panel  . Lipid panel  . EKG 12-Lead   Meds ordered this encounter  Medications  . torsemide (DEMADEX) 20 MG tablet    Sig: Take 2 tablets (40 mg total) by mouth 2 (two) times daily.    Dispense:  120 tablet    Refill:  0    Patient Instructions  Medication Instructions:  Your physician has recommended you make the following change in your medication:   1) STOP Furosemide (Lasix)  2) START Torsemide 40 mg twice daily. An Rx has been sent to your pharmacy.  *If you need a refill on your cardiac medications before your next appointment, please call your pharmacy*  Lab Work: Your physician recommends that you return for a FASTING lipid profile and bmet on 07/21/19.  Please have your lab drawn at the [facility] you do not need an appointment.  If you have labs (blood work) drawn today and your tests are completely normal, you will receive your results only by: Marland Kitchen MyChart Message (if you have MyChart) OR . A paper copy in the mail If you have any lab test that is abnormal or we need to change your treatment, we will call you to review the  results.  Testing/Procedures: None ordered  Follow-Up: At [facility], you and your health needs are our priority.  As part of our continuing mission to provide you with exceptional heart care, we have created designated Provider Care Teams.  These Care Teams include your primary Cardiologist (physician) and Advanced Practice Providers (APPs -  Physician Assistants and Nurse Practitioners) who all work together to provide you with the care you need, when you need it.  Your next appointment:   07/22/19   The format for your next appointment:   In Person  Provider:    You may see Kate Sable, MD or one of the following Advanced Practice Providers on your designated Care Team:    Murray Hodgkins, NP  Christell Faith, PA-C  Marrianne Mood, PA-C   Other Instructions N/A     Signed, Kate Sable, MD  07/15/2019 3:22 PM    [place]

## 2019-07-16 ENCOUNTER — Other Ambulatory Visit: Payer: Self-pay

## 2019-07-16 ENCOUNTER — Emergency Department
Admission: EM | Admit: 2019-07-16 | Discharge: 2019-07-16 | Disposition: A | Discharge status: Home / Self Care | Payer: BC Managed Care – PPO | Attending: Emergency Medicine | Admitting: Emergency Medicine

## 2019-07-16 DIAGNOSIS — R040 Epistaxis: Secondary | ICD-10-CM | POA: Insufficient documentation

## 2019-07-16 DIAGNOSIS — F17210 Nicotine dependence, cigarettes, uncomplicated: Secondary | ICD-10-CM | POA: Diagnosis not present

## 2019-07-16 DIAGNOSIS — I252 Old myocardial infarction: Secondary | ICD-10-CM | POA: Insufficient documentation

## 2019-07-16 DIAGNOSIS — R0989 Other specified symptoms and signs involving the circulatory and respiratory systems: Secondary | ICD-10-CM | POA: Diagnosis not present

## 2019-07-16 DIAGNOSIS — Z7901 Long term (current) use of anticoagulants: Secondary | ICD-10-CM | POA: Insufficient documentation

## 2019-07-16 DIAGNOSIS — I110 Hypertensive heart disease with heart failure: Secondary | ICD-10-CM | POA: Insufficient documentation

## 2019-07-16 DIAGNOSIS — Z7982 Long term (current) use of aspirin: Secondary | ICD-10-CM | POA: Diagnosis not present

## 2019-07-16 DIAGNOSIS — Z79899 Other long term (current) drug therapy: Secondary | ICD-10-CM | POA: Diagnosis not present

## 2019-07-16 DIAGNOSIS — R0981 Nasal congestion: Secondary | ICD-10-CM | POA: Insufficient documentation

## 2019-07-16 DIAGNOSIS — E119 Type 2 diabetes mellitus without complications: Secondary | ICD-10-CM | POA: Diagnosis not present

## 2019-07-16 DIAGNOSIS — Z8551 Personal history of malignant neoplasm of bladder: Secondary | ICD-10-CM | POA: Insufficient documentation

## 2019-07-16 DIAGNOSIS — I509 Heart failure, unspecified: Secondary | ICD-10-CM | POA: Insufficient documentation

## 2019-07-16 MED ORDER — OXYMETAZOLINE HCL 0.05 % NA SOLN
1.0000 | Freq: Once | NASAL | Status: AC
  Administered 2019-07-16: 15:00:00 1 via NASAL
  Filled 2019-07-16: qty 30

## 2019-07-16 MED ORDER — TRANEXAMIC ACID 1000 MG/10ML IV SOLN
500.0000 mg | Freq: Once | INTRAVENOUS | Status: AC
  Administered 2019-07-16: 500 mg via TOPICAL
  Filled 2019-07-16: qty 10

## 2019-07-16 NOTE — ED Provider Notes (Signed)
[facility] Emergency Department Provider Note ____________________________________________  Time seen: 1312  I have reviewed the triage vital signs and the nursing notes.  HISTORY  Chief Complaint  Epistaxis  HPI Frederick Perry is a 64 y.o. male presents to the ED for evaluation of a left-sided nosebleed. He reports onset a few hours prior to arrival. He admits to blowing his nose. He has been using saline mist to manage his nasal congestion and runny nose. He notes this is the first time he has ever had a nosebleed.  He is currently on a blood thinner for his DVT prophylaxis.  He complains now of a blood clot to the left nostril as well as sinus congestion bilaterally.  Past Medical History:  Diagnosis Date  . CHF (congestive heart failure) ([place])   . Coronary artery disease    LHC 2017- occ OM2, occ PDA (both being filled via collaterals), moderate disease in LAD  . Diabetes mellitus without complication ([place])    Not on medications  . Hypertension    Not on medications.stopped since he ran out of medications  . Myocardial infarction ([place])   . Polysubstance abuse ([place])    a. ongoing tobacco and alcohol abuse   . Pulmonary embolism [facility])     Patient Active Problem List   Diagnosis Date Noted  . Diabetes mellitus without complication ([place]) 99991111  . Bladder cancer ([place]) 03/26/2016  . Perforated bowel ([place]) 12/14/2015  . Perforation bowel ([place])   . Iron deficiency anemia due to chronic blood loss 09/07/2015  . Weight loss   . NSTEMI (non-ST elevated myocardial infarction) ([place]) 09/05/2015  . Absolute anemia   . Ischemic chest pain ([place])   . Uncontrolled type 2 diabetes mellitus with circulatory disorder ([place])   . Hyperlipidemia     Past Surgical History:  Procedure Laterality Date  . BACK SURGERY    . CARDIAC CATHETERIZATION N/A 09/05/2015   Procedure: Left Heart Cath and Coronary Angiography;  Surgeon: Minna Merritts, MD;  Location: [place] [facility];  Service: Cardiovascular;  Laterality: N/A;  . DRAIN REMOVAL Right 12/20/2015   Procedure: DRAIN REMOVAL;  Surgeon: Nickie Retort, MD;  Location: [facility] ORS;  Service: Urology;  Laterality: Right;  by dr copper  . LAPAROTOMY N/A 12/14/2015   Procedure: EXPLORATORY LAPAROTOMY, prepyloric gastric ulcer biopsy;  Surgeon: Jules Husbands, MD;  Location: [facility] ORS;  Service: General;  Laterality: N/A;  . Patellar tendon repair Right   . PERIPHERAL VASCULAR CATHETERIZATION N/A 09/07/2015   Procedure: IVC Filter Insertion;  Surgeon: Algernon Huxley, MD;  Location: [place] [facility];  Service: Cardiovascular;  Laterality: N/A;  . Testicular torsion repair    . TRANSURETHRAL RESECTION OF BLADDER TUMOR N/A 12/20/2015   Procedure: TRANSURETHRAL RESECTION OF BLADDER TUMOR (TURBT);  Surgeon: Nickie Retort, MD;  Location: [facility] ORS;  Service: Urology;  Laterality: N/A;  . TRANSURETHRAL RESECTION OF BLADDER TUMOR N/A 03/26/2016   Procedure: TRANSURETHRAL RESECTION OF BLADDER TUMOR (TURBT);  Surgeon: Nickie Retort, MD;  Location: [facility] ORS;  Service: Urology;  Laterality: N/A;    Prior to Admission medications   Medication Sig Start Date End Date Taking? Authorizing Provider  apixaban (ELIQUIS) 5 MG TABS tablet Take 2 tablets (10mg ) twice daily for 7 days, then 1 tablet (5mg ) twice daily 07/12/19   Merlyn Lot, MD  aspirin EC 81 MG tablet Take 81 mg by mouth daily.    [provider]  lisinopril (PRINIVIL,ZESTRIL) 20  MG tablet Take 1 tablet (20 mg total) by mouth daily. 07/08/18   Doles-Johnson, Teah, NP  torsemide (DEMADEX) 20 MG tablet Take 2 tablets (40 mg total) by mouth 2 (two) times daily. 07/15/19   Kate Sable, MD    Allergies Patient has no known allergies.  Family History  Problem Relation Age of Onset  . Congestive Heart Failure Mother   . Peripheral vascular disease Father     Social History Social History   Tobacco Use  . Smoking status: Current  Every Day Smoker    Packs/day: 0.50    Years: 40.00    Pack years: 20.00    Types: Cigarettes  . Smokeless tobacco: Never Used  Substance Use Topics  . Alcohol use: Yes    Alcohol/week: 10.0 standard drinks    Types: 10 Cans of beer per week    Comment: daily- beers and occasionally liquor  . Drug use: No    Review of Systems  Constitutional: Negative for fever. Eyes: Negative for visual changes. ENT: Negative for sore throat.  Reports nosebleeds as above. Cardiovascular: Negative for chest pain. Respiratory: Negative for shortness of breath. Musculoskeletal: Negative for back pain. Skin: Negative for rash.  Denies any abnormal bleeding or bruising. Neurological: Negative for headaches, focal weakness or numbness. ____________________________________________  PHYSICAL EXAM:  VITAL SIGNS: ED Triage Vitals  Enc Vitals Group     BP 07/16/19 1259 (!) 126/105     Pulse Rate 07/16/19 1259 90     Resp 07/16/19 1259 20     Temp 07/16/19 1259 98.2 F (36.8 C)     Temp Source 07/16/19 1259 Oral     SpO2 07/16/19 1259 93 %     Weight 07/16/19 1257 292 lb (132.5 kg)     Height 07/16/19 1257 6\' 1"  (1.854 m)     Head Circumference --      Peak Flow --      Pain Score 07/16/19 1256 0     Pain Loc --      Pain Edu? --      Excl. in [place]? --     Constitutional: Alert and oriented. Well appearing and in no distress. Head: Normocephalic and atraumatic. Eyes: Conjunctivae are normal. Normal extraocular movements Nose: patent right nostril with local edema of turbinates. Left nostril with fresh blood clot in vestibule. Once cleared, there is slow, active bleeding from the anterior, medial septal region.  Cardiovascular: Normal rate, regular rhythm. Normal distal pulses. Respiratory: Normal respiratory effort. No wheezes/rales/rhonchi. Gastrointestinal: Soft and nontender. No distention. Musculoskeletal: Nontender with normal range of motion in all extremities.  Neurologic:  Normal  gait without ataxia. Normal speech and language. No gross focal neurologic deficits are appreciated. ____________________________________________  PROCEDURES  Afrin i spray per nostril TXA-soaked cotton ball applied topically to the left nare Procedures ____________________________________________  INITIAL IMPRESSION / ASSESSMENT AND PLAN / ED COURSE  Patient with ED evaluation management of an active nosebleed to the left nostril.  Patient presents with a fresh blood clot in place.  Clot is dislodged, and Afrin is applied initially.  Once the nare could be viewed in a bloodless field, there is noted to be active slow bleeding from the medial anterior portion of the vestibule.  A TXA soaked cottonball was applied and left in placed for 20+ minutes.  Patient was discharged without incident, and nosebleed had been aborted at that time.  He is discharged to follow-up with [facility] should symptoms resolved but is also given  Afrin and instructions on acute nosebleed management.  Frederick Perry was evaluated in Emergency Department on 07/16/2019 for the symptoms described in the history of present illness. He was evaluated in the context of the global COVID-19 pandemic, which necessitated consideration that the patient might be at risk for infection with the SARS-CoV-2 virus that causes COVID-19. Institutional protocols and algorithms that pertain to the evaluation of patients at risk for COVID-19 are in a state of rapid change based on information released by regulatory bodies including the CDC and federal and state organizations. These policies and algorithms were followed during the patient's care in the ED. ____________________________________________  FINAL CLINICAL IMPRESSION(S) / ED DIAGNOSES  Final diagnoses:  Left-sided epistaxis      Carmie End, Dannielle Karvonen, PA-C 07/16/19 1708    Lavonia Drafts, MD 07/17/19 1534

## 2019-07-16 NOTE — ED Triage Notes (Signed)
Pt states his nose started bleeding a couple hours ago- bleeding controlled at this time- pt is on a blood thinner

## 2019-07-16 NOTE — Discharge Instructions (Addendum)
Your exam is normal following  your exam. We were able to get your nose bleed under control. You may use the Afrin on a cotton ball to stop any ongoing bleeding. Follow-up with Dr. Richardson Landry for follow-up, if needed.

## 2019-07-16 NOTE — ED Triage Notes (Signed)
First RN Note: Pt presents to ED via POV with c/o epistaxis. Pt states drove himself to ED. Pt states bleeding is controlled at this time.

## 2019-07-21 ENCOUNTER — Other Ambulatory Visit
Admission: RE | Admit: 2019-07-21 | Discharge: 2019-07-21 | Disposition: A | Discharge status: Home / Self Care | Payer: BC Managed Care – PPO | Source: Ambulatory Visit | Attending: Cardiology | Admitting: Cardiology

## 2019-07-21 DIAGNOSIS — R600 Localized edema: Secondary | ICD-10-CM | POA: Diagnosis present

## 2019-07-21 DIAGNOSIS — E785 Hyperlipidemia, unspecified: Secondary | ICD-10-CM

## 2019-07-21 LAB — BASIC METABOLIC PANEL
Anion gap: 14 (ref 5–15)
BUN: 25 mg/dL — ABNORMAL HIGH (ref 8–23)
CO2: 33 mmol/L — ABNORMAL HIGH (ref 22–32)
Calcium: 9 mg/dL (ref 8.9–10.3)
Chloride: 90 mmol/L — ABNORMAL LOW (ref 98–111)
Creatinine, Ser: 2.02 mg/dL — ABNORMAL HIGH (ref 0.61–1.24)
GFR calc Af Amer: 40 mL/min — ABNORMAL LOW (ref >60)
GFR calc non Af Amer: 34 mL/min — ABNORMAL LOW (ref >60)
Glucose, Bld: 155 mg/dL — ABNORMAL HIGH (ref 70–99)
Potassium: 4.4 mmol/L (ref 3.5–5.1)
Sodium: 137 mmol/L (ref 135–145)

## 2019-07-21 LAB — LIPID PANEL
Cholesterol: 189 mg/dL (ref 0–200)
HDL: 55 mg/dL (ref >40)
LDL Cholesterol: 118 mg/dL — ABNORMAL HIGH (ref 0–99)
Total CHOL/HDL Ratio: 3.4 RATIO
Triglycerides: 82 mg/dL (ref <150)
VLDL: 16 mg/dL (ref 0–40)

## 2019-07-22 ENCOUNTER — Other Ambulatory Visit: Payer: Self-pay

## 2019-07-22 ENCOUNTER — Encounter: Payer: Self-pay | Admitting: Cardiology

## 2019-07-22 ENCOUNTER — Ambulatory Visit (INDEPENDENT_AMBULATORY_CARE_PROVIDER_SITE_OTHER): Payer: BC Managed Care – PPO | Admitting: Cardiology

## 2019-07-22 VITALS — BP 130/52 | HR 95 | Ht 73.0 in | Wt 280.2 lb

## 2019-07-22 DIAGNOSIS — Z79899 Other long term (current) drug therapy: Secondary | ICD-10-CM

## 2019-07-22 DIAGNOSIS — F17200 Nicotine dependence, unspecified, uncomplicated: Secondary | ICD-10-CM | POA: Diagnosis not present

## 2019-07-22 DIAGNOSIS — I2510 Atherosclerotic heart disease of native coronary artery without angina pectoris: Secondary | ICD-10-CM

## 2019-07-22 DIAGNOSIS — E785 Hyperlipidemia, unspecified: Secondary | ICD-10-CM

## 2019-07-22 DIAGNOSIS — R600 Localized edema: Secondary | ICD-10-CM | POA: Diagnosis not present

## 2019-07-22 MED ORDER — ATORVASTATIN CALCIUM 40 MG PO TABS: 40.0000 mg | ORAL_TABLET | Freq: Every day | ORAL | 3 refills | Status: DC

## 2019-07-22 MED ORDER — TORSEMIDE 20 MG PO TABS: ORAL_TABLET | ORAL | 6 refills | Status: DC

## 2019-07-22 NOTE — Patient Instructions (Addendum)
Medication Instructions:  - Your physician has recommended you make the following change in your medication:   1) Decrease torsemide 20 mg- take 2 tablets (40 mg) by mouth once a day  2) Start lipitor (atorvastatin) 40 mg- take 1 tablet (40 mg) by mouth once a day  *If you need a refill on your cardiac medications before your next appointment, please call your pharmacy*  Lab Work: - Your physician recommends that you return for lab work: 1-2 days prior to your follow up appointment with Dr. Garen Lah (BMP)  - [place] entrance at [facility], 1st desk on the right (past the screening desk) to check in - Lab hours: Monday- Friday (7:30 am- 5:30 pm)   If you have labs (blood work) drawn today and your tests are completely normal, you will receive your results only by: Marland Kitchen MyChart Message (if you have MyChart) OR . A paper copy in the mail If you have any lab test that is abnormal or we need to change your treatment, we will call you to review the results.  Testing/Procedures: - Echocardiogram as scheduled  Follow-Up: At [facility], you and your health needs are our priority.  As part of our continuing mission to provide you with exceptional heart care, we have created designated Provider Care Teams.  These Care Teams include your primary Cardiologist (physician) and Advanced Practice Providers (APPs -  Physician Assistants and Nurse Practitioners) who all work together to provide you with the care you need, when you need it.  Your next appointment:   1 month(s)  The format for your next appointment:   In Person  Provider:   Kate Sable, MD  Other Instructions - You may call [facility]) at [phone] for assistance with finding a Primary Care Doctor

## 2019-07-22 NOTE — Progress Notes (Signed)
Cardiology Office Note:    Date:  07/22/2019   ID:  Frederick Perry, DOB 1956-01-24, MRN [id number]  PCP:  Patient, No Pcp Per  Cardiologist:  No primary care provider on file.  Electrophysiologist:  None   Referring MD: No ref. provider found   Chief Complaint  Patient presents with  . office visit    Pt 1 week f/u-BLLE edema. Meds verbally reviewed w/ pt.    History of Present Illness:    Frederick GONGWER is a 64 y.o. male with a hx of hypertension, diabetes, smoker, DVT, PE status post IVC filter placement in 2017 who presents for follow-up.  He was last seen due to lower extremity edema.    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  He was seen at an urgent care and started on 20 mg of Lasix p.o. but symptoms persisted.  Patient was seen in the emergency room on 07/11/2018.  Ultrasound of the lower extremity showed DVT in the mid right femoral vein. Patient was started on Eliquis. Patient states his dry weight is around 145 pounds.  He denies dyspnea, chest pain but endorses 3 pillow orthopnea and worsening lower extremity edema.  His Lasix was switched to torsemide 40 mg twice daily after last visit.  He states feeling much better, his extremity swelling is much improved and he can now tie his shoelaces.   Patient was seen in the [facility] back in 08/2015 for chest pain.  Echocardiogram at the time showed EF of 50 to [id number], diastolic function was normal.  Left heart cath showed occluded OM 2 vessel and occluded PDA both being filled via collaterals.  Moderate disease noted in the mid LAD.  Medical management was recommended.  He was subsequently diagnosed with DVT and PE on that admission in 08/2015, had an IVC filter placed since patient was anemic with hemoglobin of 4.9 at the time.  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   Past Medical History:  Diagnosis Date  . CHF (congestive heart  failure) ([place])   . Coronary artery disease    LHC 2017- occ OM2, occ PDA (both being filled via collaterals), moderate disease in LAD  . Diabetes mellitus without complication ([place])    Not on medications  . Hypertension    Not on medications.stopped since he ran out of medications  . Myocardial infarction ([place])   . Polysubstance abuse ([place])    a. ongoing tobacco and alcohol abuse   . Pulmonary embolism [facility])     Past Surgical History:  Procedure Laterality Date  . BACK SURGERY    . CARDIAC CATHETERIZATION N/A 09/05/2015   Procedure: Left Heart Cath and Coronary Angiography;  Surgeon: Minna Merritts, MD;  Location: [facility];  Service: Cardiovascular;  Laterality: N/A;  . DRAIN REMOVAL Right 12/20/2015   Procedure: DRAIN REMOVAL;  Surgeon: Nickie Retort, MD;  Location: [facility];  Service: Urology;  Laterality: Right;  by dr copper  . LAPAROTOMY N/A 12/14/2015   Procedure: EXPLORATORY LAPAROTOMY, prepyloric gastric ulcer biopsy;  Surgeon: Jules Husbands, MD;  Location: [facility];  Service: General;  Laterality: N/A;  . Patellar tendon repair Right   . PERIPHERAL VASCULAR CATHETERIZATION N/A 09/07/2015   Procedure: IVC Filter Insertion;  Surgeon: Algernon Huxley, MD;  Location: [facility];  Service: Cardiovascular;  Laterality: N/A;  . Testicular torsion repair    . TRANSURETHRAL RESECTION OF  BLADDER TUMOR N/A 12/20/2015   Procedure: TRANSURETHRAL RESECTION OF BLADDER TUMOR (TURBT);  Surgeon: Nickie Retort, MD;  Location: [facility];  Service: Urology;  Laterality: N/A;  . TRANSURETHRAL RESECTION OF BLADDER TUMOR N/A 03/26/2016   Procedure: TRANSURETHRAL RESECTION OF BLADDER TUMOR (TURBT);  Surgeon: Nickie Retort, MD;  Location: [facility];  Service: Urology;  Laterality: N/A;    Current Medications: Current Meds  Medication Sig  . apixaban (ELIQUIS) 5 MG TABS tablet Take 2 tablets (10mg ) twice daily for 7 days, then 1 tablet (5mg ) twice daily  . aspirin EC 81 MG  tablet Take 81 mg by mouth daily.  Marland Kitchen lisinopril (PRINIVIL,ZESTRIL) 20 MG tablet Take 1 tablet (20 mg total) by mouth daily.  Marland Kitchen torsemide (DEMADEX) 20 MG tablet Take 2 tablets (40 mg) by mouth once a day  . [DISCONTINUED] torsemide (DEMADEX) 20 MG tablet Take 2 tablets (40 mg total) by mouth 2 (two) times daily.     Allergies:   Patient has no known allergies.   Social History   Socioeconomic History  . Marital status: Divorced    Spouse name: Not on file  . Number of children: Not on file  . Years of education: Not on file  . Highest education level: Not on file  Occupational History  . Not on file  Tobacco Use  . Smoking status: Current Every Day Smoker    Packs/day: 0.50    Years: 40.00    Pack years: 20.00    Types: Cigarettes  . Smokeless tobacco: Never Used  Substance and Sexual Activity  . Alcohol use: Yes    Alcohol/week: 10.0 standard drinks    Types: 10 Cans of beer per week    Comment: daily- beers and occasionally liquor  . Drug use: No  . Sexual activity: Not on file  Other Topics Concern  . Not on file  Social History Narrative   Lives at home with Mother and is his Mothers primary caregiver   Social Determinants of Health   Financial Resource Strain:   . Difficulty of Paying Living Expenses: Not on file  Food Insecurity:   . Worried About Charity fundraiser in the Last Year: Not on file  . Ran Out of Food in the Last Year: Not on file  Transportation Needs:   . Lack of Transportation (Medical): Not on file  . Lack of Transportation (Non-Medical): Not on file  Physical Activity:   . Days of Exercise per Week: Not on file  . Minutes of Exercise per Session: Not on file  Stress:   . Feeling of Stress : Not on file  Social Connections:   . Frequency of Communication with Friends and Family: Not on file  . Frequency of Social Gatherings with Friends and Family: Not on file  . Attends Religious Services: Not on file  . Active Member of Clubs or  Organizations: Not on file  . Attends Archivist Meetings: Not on file  . Marital Status: Not on file     Family History: The patient's family history includes Congestive Heart Failure in his mother; Peripheral vascular disease in his father.  ROS:   Please see the history of present illness.     All other systems reviewed and are negative.  EKGs/Labs/Other Studies Reviewed:    The following studies were reviewed today: TTE 2015/09/20 Left ventricle: The cavity size was normal. There was mild focal  basal hypertrophy of the septum. Systolic function was normal.  The estimated ejection fraction was in the range of 50% to 55%.  Hypokinesis of the lateral myocardium. Hypokinesis of the  inferior myocardium. Hypokinesis of the inferolateral myocardium.  Left ventricular diastolic function parameters were normal.  - Mitral valve: There was mild to moderate regurgitation.  - Left atrium: The atrium was mildly dilated.  - Right ventricle: Systolic function was normal.  - Pulmonary arteries: Systolic pressure was mildly elevated. PA  peak pressure: 42 mm Hg (S).   LHC 2017 Final Conclusions:   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 Fletcher Anon. Given profound anemia, patient currently not having chest pain, event likely happening more than 2 days ago (that is when chest pain symptoms presented), troponins already peaked, also OM 2 and you relatively small to moderate size vessel, recommended medical management  EKG:  EKG is  ordered today.  The ekg ordered today demonstrates sinus rhythm, possible old inferior infarct.  Recent Labs: 07/12/2019: B Natriuretic Peptide 720.0; Hemoglobin 16.2; Platelets 202 07/21/2019: BUN 25; Creatinine, Ser 2.02; Potassium 4.4; Sodium 137  Recent Lipid Panel    Component Value Date/Time   CHOL 189 07/21/2019 0928   CHOL 179  02/04/2018 1944   TRIG 82 07/21/2019 0928   HDL 55 07/21/2019 0928   HDL 48 02/04/2018 1944   CHOLHDL 3.4 07/21/2019 0928   VLDL 16 07/21/2019 0928   LDLCALC 118 (H) 07/21/2019 0928   LDLCALC 112 (H) 02/04/2018 1944    Physical Exam:    VS:  BP (!) 130/52 (BP Location: Left Arm, Patient Position: Sitting, Cuff Size: Normal)   Pulse 95   Ht 6\' 1"  (1.854 m)   Wt 280 lb 4 oz (127.1 kg)   SpO2 92%   BMI 36.97 kg/m     Wt Readings from Last 3 Encounters:  07/22/19 280 lb 4 oz (127.1 kg)  07/16/19 292 lb (132.5 kg)  07/15/19 293 lb (132.9 kg)     GEN:  Well nourished, well developed in no acute distress HEENT: Normal NECK: No JVD; No carotid bruits LYMPHATICS: No lymphadenopathy CARDIAC: RRR, no murmurs, rubs, gallops RESPIRATORY:  Clear to auscultation anteriorly, decreased at bases. ABDOMEN: Soft, non-tender, distended MUSCULOSKELETAL:  2+ edema to thighs; No deformity  SKIN: Warm and dry NEUROLOGIC:  Alert and oriented x 3 PSYCHIATRIC:  Normal affect   ASSESSMENT:    1. Bilateral leg edema   2. Coronary artery disease involving native coronary artery of native heart without angina pectoris   3. Smoking   4. Hyperlipidemia, unspecified hyperlipidemia type   5. Medication management    PLAN:    In order of problems listed above:  1. Edema is improved, patient lost about 13 pounds in the last week but creatinine was rising on last BMP.  His measured weight in September of 2019 was 243 pounds.  Decrease torsemide to 40 mg daily.  Patient encouraged to  measure weights daily, low-salt diet.  Echocardiogram is scheduled for 2 weeks to evaluate presence of cardiomyopathy.  He was encouraged to keep that appointment.  We will get a BMP 1 day before his follow-up visit with me in 1 month. 2. History of CAD with occlusion of OM2 and PDA.  Continue aspirin 81 mg.  LDL is 118.  Start Lipitor 40 mg daily 3. Smoking cessation advised.  Over 5 minutes spent. 4. History of CAD, LDL  is 118.  Will start Lipitor 40 mg daily.   Follow-up in 1 month  This note was generated in part or whole with voice recognition software. Voice recognition is usually quite accurate but there are transcription errors that can and very often do occur. I apologize for any typographical errors that were not detected and corrected.  Medication Adjustments/Labs and Tests Ordered: Current medicines are reviewed at length with the patient today.  Concerns regarding medicines are outlined above.  Orders Placed This Encounter  Procedures  . Basic metabolic panel  . EKG 12-Lead   Meds ordered this encounter  Medications  . torsemide (DEMADEX) 20 MG tablet    Sig: Take 2 tablets (40 mg) by mouth once a day    Dispense:  60 tablet    Refill:  6    Dose decrease  . atorvastatin (LIPITOR) 40 MG tablet    Sig: Take 1 tablet (40 mg total) by mouth daily.    Dispense:  90 tablet    Refill:  3    Patient Instructions  Medication Instructions:  - Your physician has recommended you make the following change in your medication:   1) Decrease torsemide 20 mg- take 2 tablets (40 mg) by mouth once a day  2) Start lipitor (atorvastatin) 40 mg- take 1 tablet (40 mg) by mouth once a day  *If you need a refill on your cardiac medications before your next appointment, please call your pharmacy*  Lab Work: - Your physician recommends that you return for lab work: 1-2 days prior to your follow up appointment with Dr. Garen Lah (BMP)  - [place] entrance at [facility], 1st desk on the right (past the screening desk) to check in - Lab hours: Monday- Friday (7:30 am- 5:30 pm)   If you have labs (blood work) drawn today and your tests are completely normal, you will receive your results only by: Marland Kitchen MyChart Message (if you have MyChart) OR . A paper copy in the mail If you have any lab test that is abnormal or we need to change your treatment, we will call you to review the  results.  Testing/Procedures: - Echocardiogram as scheduled  Follow-Up: At [facility], you and your health needs are our priority.  As part of our continuing mission to provide you with exceptional heart care, we have created designated Provider Care Teams.  These Care Teams include your primary Cardiologist (physician) and Advanced Practice Providers (APPs -  Physician Assistants and Nurse Practitioners) who all work together to provide you with the care you need, when you need it.  Your next appointment:   1 month(s)  The format for your next appointment:   In Person  Provider:   Kate Sable, MD  Other Instructions - You may call [facility]) at [phone] for assistance with finding a Primary Care Doctor    Signed, Kate Sable, MD  07/22/2019 12:19 PM    [place]

## 2019-08-02 NOTE — Progress Notes (Signed)
Patient ID: Frederick Perry, male    DOB: 17-Jun-1955, 64 y.o.   MRN: [id number]  HPI  Frederick Perry is a 64 y/o male with a history of DM, HTN, MI, pulmonary embolus, current tobacco use and chronic heart failure.   Echo report from 09/05/15 reviewed and showed an EF of 50-55% along with mildly elevated PA pressure and moderate Frederick.   Cardiac catheterization done 09/05/15 but unable to view results.   Was in the ED 07/16/19 due to left sided nosebleed where he was treated and released. Was in the ED 07/02/19 due to pedal edema. IV lasix given. 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   He presents today for a follow-up visit with a chief complaint of pedal edema. He describes this as having been present for severel months although it has been improving since the last time he was here. He has associated abdominal distention and chronic difficulty sleeping along with this. He denies any palpitations, chest pain, wheezing, dizziness, shortness of breath, cough or weight gain.   He did not show for his echo today as he didn't get a reminder call about it and didn't realize it was today.  Past Medical History:  Diagnosis Date  . CHF (congestive heart failure) ([place])   . Coronary artery disease    LHC 2017- occ OM2, occ PDA (both being filled via collaterals), moderate disease in LAD  . Diabetes mellitus without complication ([place])    Not on medications  . Hypertension    Not on medications.stopped since he ran out of medications  . Myocardial infarction ([place])   . Polysubstance abuse ([place])    a. ongoing tobacco and alcohol abuse   . Pulmonary embolism [facility])    Past Surgical History:  Procedure Laterality Date  . BACK SURGERY    . CARDIAC CATHETERIZATION N/A 09/05/2015   Procedure: Left Heart Cath and Coronary Angiography;  Surgeon: Minna Merritts, MD;  Location: [facility];  Service:  Cardiovascular;  Laterality: N/A;  . DRAIN REMOVAL Right 12/20/2015   Procedure: DRAIN REMOVAL;  Surgeon: Nickie Retort, MD;  Location: [facility];  Service: Urology;  Laterality: Right;  by dr copper  . LAPAROTOMY N/A 12/14/2015   Procedure: EXPLORATORY LAPAROTOMY, prepyloric gastric ulcer biopsy;  Surgeon: Jules Husbands, MD;  Location: [facility];  Service: General;  Laterality: N/A;  . Patellar tendon repair Right   . PERIPHERAL VASCULAR CATHETERIZATION N/A 09/07/2015   Procedure: IVC Filter Insertion;  Surgeon: Algernon Huxley, MD;  Location: [facility];  Service: Cardiovascular;  Laterality: N/A;  . Testicular torsion repair    . TRANSURETHRAL RESECTION OF BLADDER TUMOR N/A 12/20/2015   Procedure: TRANSURETHRAL RESECTION OF BLADDER TUMOR (TURBT);  Surgeon: Nickie Retort, MD;  Location: [facility];  Service: Urology;  Laterality: N/A;  . TRANSURETHRAL RESECTION OF BLADDER TUMOR N/A 03/26/2016   Procedure: TRANSURETHRAL RESECTION OF BLADDER TUMOR (TURBT);  Surgeon: Nickie Retort, MD;  Location: [facility];  Service: Urology;  Laterality: N/A;   Family History  Problem Relation Age of Onset  . Congestive Heart Failure Mother   . Peripheral vascular disease Father    Social History   Tobacco Use  . Smoking status: Current Every Day Smoker    Packs/day: 0.50    Years: 40.00    Pack years: 20.00    Types: Cigarettes  . Smokeless  tobacco: Never Used  Substance Use Topics  . Alcohol use: Yes    Alcohol/week: 10.0 standard drinks    Types: 10 Cans of beer per week    Comment: daily- beers and occasionally liquor   No Known Allergies  Prior to Admission medications   Medication Sig Start Date End Date Taking? Authorizing Provider  apixaban (ELIQUIS) 5 MG TABS tablet Take 2 tablets (10mg ) twice daily for 7 days, then 1 tablet (5mg ) twice daily 07/12/19  Yes Merlyn Lot, MD  aspirin EC 81 MG tablet Take 81 mg by mouth daily.   Yes [provider]  atorvastatin  (LIPITOR) 40 MG tablet Take 1 tablet (40 mg total) by mouth daily. 07/22/19 10/20/19 Yes Agbor-Etang, Aaron Edelman, MD  lisinopril (PRINIVIL,ZESTRIL) 20 MG tablet Take 1 tablet (20 mg total) by mouth daily. 07/08/18  Yes Doles-Johnson, Teah, NP  torsemide (DEMADEX) 20 MG tablet Take 2 tablets (40 mg) by mouth once a day 07/22/19  Yes Kate Sable, MD     Review of Systems  Constitutional: Negative for appetite change and fatigue.  HENT: Negative for congestion, rhinorrhea and sore throat.   Eyes: Negative.   Respiratory: Negative for cough, shortness of breath and wheezing.   Cardiovascular: Positive for leg swelling (improving). Negative for chest pain and palpitations.  Gastrointestinal: Positive for abdominal distention (improving). Negative for abdominal pain.  Endocrine: Negative.   Genitourinary: Negative.   Musculoskeletal: Negative for back pain and neck pain.  Skin: Negative for wound.  Allergic/Immunologic: Negative.   Neurological: Negative for dizziness and light-headedness.  Hematological: Negative for adenopathy. Does not bruise/bleed easily.  Psychiatric/Behavioral: Positive for sleep disturbance (sleeping on 3 pillows). Negative for dysphoric mood. The patient is not nervous/anxious.    Vitals:   08/03/19 1214  BP: 116/77  Pulse: (!) 107  Resp: 20  SpO2: 96%  Weight: 270 lb (122.5 kg)  Height: 6\' 1"  (1.854 m)   Wt Readings from Last 3 Encounters:  08/03/19 270 lb (122.5 kg)  07/22/19 280 lb 4 oz (127.1 kg)  07/16/19 292 lb (132.5 kg)   Lab Results  Component Value Date   CREATININE 2.02 (H) 07/21/2019   CREATININE 1.56 (H) 07/12/2019   CREATININE 1.18 02/04/2018     Physical Exam Vitals and nursing note reviewed.  Constitutional:      Appearance: He is well-developed.  HENT:     Head: Normocephalic and atraumatic.  Cardiovascular:     Rate and Rhythm: Regular rhythm. Tachycardia present.  Pulmonary:     Effort: Pulmonary effort is normal. No respiratory  distress.     Breath sounds: No wheezing or rales.  Abdominal:     Palpations: Abdomen is soft.     Tenderness: There is no abdominal tenderness.     Comments: Distended  Musculoskeletal:     Cervical back: Normal range of motion and neck supple.     Right lower leg: No tenderness. Edema (2+ pitting to thigh) present.     Left lower leg: No tenderness. Edema (2+ pitting to thigh) present.  Skin:    General: Skin is warm and dry.  Neurological:     General: No focal deficit present.     Mental Status: He is alert and oriented to person, place, and time.  Psychiatric:        Mood and Affect: Mood normal.        Behavior: Behavior normal.     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 - saw cardiology [place] 07/22/19 - BNP 07/12/19 was 720.0 - missed echo earlier today as he didn't get a reminder call; # given to patient so that he can call when it works for his schedule  2: HTN- - BP looks good today - BMP 07/21/19 reviewed and showed sodium 137, potassium 4.4, creatinine 2.02 and GFR 40  3: Tobacco use- - smokes 1/2 ppd of cigarettes - drinks beer but unable to quantify but says that it's "a couple" especially when watching ball games  4: Lymphedema- - to get compression socks and apply them daily with removal at bedtime - elevate legs when sitting for long periods of time - 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   Return in 2 months or sooner for any questions/problems  before then.

## 2019-08-03 ENCOUNTER — Ambulatory Visit: Admission: RE | Admit: 2019-08-03 | Payer: BC Managed Care – PPO | Source: Ambulatory Visit

## 2019-08-03 ENCOUNTER — Other Ambulatory Visit: Payer: Self-pay

## 2019-08-03 ENCOUNTER — Ambulatory Visit: Payer: BC Managed Care – PPO | Attending: Family | Admitting: Family

## 2019-08-03 ENCOUNTER — Encounter: Payer: Self-pay | Admitting: Family

## 2019-08-03 VITALS — BP 116/77 | HR 107 | Resp 20 | Ht 73.0 in | Wt 270.0 lb

## 2019-08-03 DIAGNOSIS — I252 Old myocardial infarction: Secondary | ICD-10-CM | POA: Insufficient documentation

## 2019-08-03 DIAGNOSIS — Z7901 Long term (current) use of anticoagulants: Secondary | ICD-10-CM | POA: Diagnosis not present

## 2019-08-03 DIAGNOSIS — I10 Essential (primary) hypertension: Secondary | ICD-10-CM

## 2019-08-03 DIAGNOSIS — Z8249 Family history of ischemic heart disease and other diseases of the circulatory system: Secondary | ICD-10-CM | POA: Insufficient documentation

## 2019-08-03 DIAGNOSIS — I5032 Chronic diastolic (congestive) heart failure: Secondary | ICD-10-CM

## 2019-08-03 DIAGNOSIS — Z86711 Personal history of pulmonary embolism: Secondary | ICD-10-CM | POA: Insufficient documentation

## 2019-08-03 DIAGNOSIS — I110 Hypertensive heart disease with heart failure: Secondary | ICD-10-CM | POA: Diagnosis not present

## 2019-08-03 DIAGNOSIS — F17210 Nicotine dependence, cigarettes, uncomplicated: Secondary | ICD-10-CM | POA: Diagnosis not present

## 2019-08-03 DIAGNOSIS — Z7982 Long term (current) use of aspirin: Secondary | ICD-10-CM | POA: Diagnosis not present

## 2019-08-03 DIAGNOSIS — E119 Type 2 diabetes mellitus without complications: Secondary | ICD-10-CM | POA: Diagnosis not present

## 2019-08-03 DIAGNOSIS — Z79899 Other long term (current) drug therapy: Secondary | ICD-10-CM | POA: Insufficient documentation

## 2019-08-03 DIAGNOSIS — I2510 Atherosclerotic heart disease of native coronary artery without angina pectoris: Secondary | ICD-10-CM | POA: Diagnosis not present

## 2019-08-03 DIAGNOSIS — Z720 Tobacco use: Secondary | ICD-10-CM

## 2019-08-03 DIAGNOSIS — I890 Lymphedema, not elsewhere classified: Secondary | ICD-10-CM | POA: Insufficient documentation

## 2019-08-03 DIAGNOSIS — I509 Heart failure, unspecified: Secondary | ICD-10-CM | POA: Diagnosis present

## 2019-08-03 NOTE — Patient Instructions (Signed)
Continue weighing daily and call for an overnight weight gain of > 2 pounds or a weekly weight gain of >5 pounds. 

## 2019-08-08 ENCOUNTER — Other Ambulatory Visit: Payer: Self-pay | Admitting: *Deleted

## 2019-08-08 MED ORDER — TORSEMIDE 20 MG PO TABS: ORAL_TABLET | ORAL | 0 refills | Status: DC

## 2019-08-08 NOTE — Telephone Encounter (Signed)
Requested Prescriptions   Signed Prescriptions Disp Refills  . torsemide (DEMADEX) 20 MG tablet 60 tablet 0    Sig: Take 2 tablets (40 mg) by mouth once a day    Authorizing Provider: Kate Sable    Ordering User: Britt Bottom

## 2019-08-09 ENCOUNTER — Telehealth: Payer: Self-pay | Admitting: Pharmacy Technician

## 2019-08-09 NOTE — Telephone Encounter (Signed)
Spoke with patient.  Patient stated that he has active prescription drug coverage with [employer].  Patient stated that he did not understand why [facility] received a prescription, because he did not ask for medication assistance.  Patient acknowledged that he understood that he does not meet [facility]'s eligibility criteria and that [facility] cannot provided medication assistance.  [facility]

## 2019-08-11 ENCOUNTER — Other Ambulatory Visit: Payer: Self-pay | Admitting: *Deleted

## 2019-08-11 MED ORDER — TORSEMIDE 20 MG PO TABS: ORAL_TABLET | ORAL | 0 refills | Status: DC

## 2019-08-11 NOTE — Telephone Encounter (Signed)
Requested Prescriptions   Signed Prescriptions Disp Refills  . torsemide (DEMADEX) 20 MG tablet 60 tablet 0    Sig: Take 2 tablets (40 mg) by mouth once a day    Authorizing Provider: Kate Sable    Ordering User: Britt Bottom

## 2019-08-19 ENCOUNTER — Ambulatory Visit: Payer: BC Managed Care – PPO | Admitting: Cardiology

## 2019-09-08 ENCOUNTER — Other Ambulatory Visit: Payer: Self-pay

## 2019-09-08 ENCOUNTER — Ambulatory Visit (INDEPENDENT_AMBULATORY_CARE_PROVIDER_SITE_OTHER): Payer: BC Managed Care – PPO

## 2019-09-08 DIAGNOSIS — I5032 Chronic diastolic (congestive) heart failure: Secondary | ICD-10-CM

## 2019-09-08 MED ORDER — PERFLUTREN LIPID MICROSPHERE
1.0000 mL | INTRAVENOUS | Status: AC | PRN
  Administered 2019-09-08: 2 mL via INTRAVENOUS

## 2019-09-08 NOTE — Progress Notes (Signed)
Cardiology Office Note    Date:  09/12/2019   ID:  Frederick Perry, DOB 19-Jun-1955, MRN [id number]  PCP:  Frederick Lighter, MD  Cardiologist:  Kate Sable, MD  Electrophysiologist:  None   Chief Complaint: Follow-up  History of Present Illness:   Frederick Perry is a 64 y.o. 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  Echo on 09/05/2015 showed an EF of 50 to 55%, hypokinesis of the lateral and inferolateral myocardium, mild focal basal hypertrophy of the septum, normal LV diastolic function, mild to moderate mitral regurgitation, mildly dilated left atrium, RV systolic function normal, PASP 42 mmHg.  Following packed red blood cell transfusion he underwent LHC during that admission which showed left main without significant disease, 50% mid LAD stenosis with the lesion being long and calcified, and occluded proximal OM2 with the distal OM2 filling via left to left collaterals, proximally occluded rPDA which was filled by left-to-right collaterals.  After discussion with interventional cardiology, given the patient's profound anemia, and in the setting of the patient not having  any further chest pain with noted DVT/PE during that admission as well, medical management was recommended.  Following this, he was lost to cardiology follow-up until 06/2019.    He was evaluated in the ED on 07/12/2019 with worsening bilateral lower extremity swelling.  He indicated he had recently been started on 20 mg of Lasix by an outside urgent care though did not feel like this was helping.  BMP showed AKI with a serum creatinine 1.56 with a baseline approximately 1.1, with CBC showing hemoglobin of 16.2, BNP 720.  Chest x-ray showed cardiomegaly with vascular congestion.  Lower extremity venous duplex was positive for right lower extremity DVT.  He was started on DVT dosed Eliquis and advised to increase his Lasix to 40 mg daily.  Following this, he was seen in the [facility] with echo being ordered.  He established with Dr. Garen Perry on 07/15/2019, with weight up from 243 pounds in 02/2018 to 293 pounds.  His Lasix was changed to torsemide 40 mg bid.  He was seen in the ED the following day with Epistaxis.  He followed up with Dr. Garen Perry on 2/5 with an improved weight to 280 pounds with improvement in his symptoms leading his torsemide to be decreased to 40 mg daily, given continued upward trend of his BUN/SCr to 25/2.02.  He underwent 2D echo 09/08/2019 which showed a new cardiomyopathy with an EF of 30-35%, global HK, Gr1DD, low normal RVSF with normal RV cavity size, mildly dilated left atrium.   He comes in doing well today from a cardiac perspective his weight is down 33 pounds when compared to his visit from  07/22/2019.  He was advised by PCP to hold torsemide last week in the setting of AKI as outlined below.  He denies any chest pain, dyspnea, dizziness, presyncope, syncope, orthopnea, PND, or early satiety.  He is limiting his salt and p.o. fluid intake.  Lower extremity swelling has resolved.  Abdomen is much less distended.  He is pleased with his improvement.  Tolerating Eliquis  without issues or symptoms concerning for bleeding.   Labs independently reviewed: 08/2019 - HGB 15.7, PLT 213, potassium 5.2, BUN 51, SCr 3.0, albumin 4.0, AST 48, ALT normal, A1c 7.6,   Past Medical History:  Diagnosis Date  . CHF (congestive heart failure) ([place])   . Coronary artery disease    LHC 2017- occ OM2, occ PDA (both being filled via collaterals), moderate disease in LAD  . Diabetes mellitus without complication ([place])    Not on medications  . Hypertension    Not on medications.stopped since he ran out of medications  . Myocardial infarction ([place])   . Polysubstance abuse ([place])    a. ongoing tobacco and alcohol abuse   . Pulmonary embolism [facility])     Past Surgical History:  Procedure Laterality Date  . BACK SURGERY    . CARDIAC CATHETERIZATION N/A 09/05/2015   Procedure: Left Heart Cath and Coronary Angiography;  Surgeon: Frederick Merritts, MD;  Location: [place] CV LAB;  Service: Cardiovascular;  Laterality: N/A;  . DRAIN REMOVAL Right 12/20/2015   Procedure: DRAIN REMOVAL;  Surgeon: Frederick Retort, MD;  Location: [facility] ORS;  Service: Urology;  Laterality: Right;  by dr copper  . LAPAROTOMY N/A 12/14/2015   Procedure: EXPLORATORY LAPAROTOMY, prepyloric gastric ulcer biopsy;  Surgeon: Frederick Husbands, MD;  Location: [facility] ORS;  Service: General;  Laterality: N/A;  . Patellar tendon repair Right   . PERIPHERAL VASCULAR CATHETERIZATION N/A 09/07/2015   Procedure: IVC Filter Insertion;  Surgeon: Frederick Huxley, MD;  Location: [place] CV LAB;  Service: Cardiovascular;  Laterality: N/A;  . Testicular torsion repair    . TRANSURETHRAL RESECTION OF BLADDER TUMOR N/A 12/20/2015   Procedure: TRANSURETHRAL RESECTION OF BLADDER TUMOR (TURBT);  Surgeon: Frederick Retort, MD;  Location: [facility] ORS;  Service: Urology;  Laterality: N/A;  . TRANSURETHRAL RESECTION OF BLADDER TUMOR N/A 03/26/2016   Procedure: TRANSURETHRAL RESECTION OF BLADDER TUMOR (TURBT);  Surgeon: Frederick Retort,  MD;  Location: [facility] ORS;  Service: Urology;  Laterality: N/A;    Current Medications: Current Meds  Medication Sig  . apixaban (ELIQUIS) 5 MG TABS tablet Take 2 tablets (10mg ) twice daily for 7 days, then 1 tablet (5mg ) twice daily  . aspirin EC 81 MG tablet Take 81 mg by mouth daily.  Marland Kitchen atorvastatin (LIPITOR) 40 MG tablet Take 1 tablet (40 mg total) by mouth daily.  . [DISCONTINUED] lisinopril (PRINIVIL,ZESTRIL) 20 MG tablet Take 1 tablet (20 mg total) by mouth daily.  . [DISCONTINUED] torsemide (DEMADEX) 20 MG tablet Take 2 tablets (40 mg) by mouth once a day    Allergies:   Patient has no known allergies.   Social History   Socioeconomic History  . Marital status: Divorced    Spouse name: Not on file  . Number of children: Not on file  . Years of education: Not on file  . Highest education level: Not on file  Occupational History  . Not on file  Tobacco Use  . Smoking status: Current Every Day Smoker    Packs/day: 0.50    Years: 40.00  Pack years: 20.00    Types: Cigarettes  . Smokeless tobacco: Never Used  Substance and Sexual Activity  . Alcohol use: Yes    Alcohol/week: 10.0 standard drinks    Types: 10 Cans of beer per week    Comment: weekly- beers and occasionally liquor  . Drug use: No  . Sexual activity: Not on file  Other Topics Concern  . Not on file  Social History Narrative   Lives at home with Mother and is his Mothers primary caregiver   Social Determinants of Health   Financial Resource Strain:   . Difficulty of Paying Living Expenses:   Food Insecurity:   . Worried About Charity fundraiser in the Last Year:   . Arboriculturist in the Last Year:   Transportation Needs:   . Film/video editor (Medical):   Marland Kitchen Lack of Transportation (Non-Medical):   Physical Activity:   . Days of Exercise per Week:   . Minutes of Exercise per Session:   Stress:   . Feeling of Stress :   Social Connections:   . Frequency of Communication with Friends and  Family:   . Frequency of Social Gatherings with Friends and Family:   . Attends Religious Services:   . Active Member of Clubs or Organizations:   . Attends Archivist Meetings:   Marland Kitchen Marital Status:      Family History:  The patient's family history includes Congestive Heart Failure in his mother; Peripheral vascular disease in his father.  ROS:   Review of Systems  Constitutional: Positive for malaise/fatigue. Negative for chills, diaphoresis, fever and weight loss.       Improved fatigue  HENT: Negative for congestion.   Eyes: Negative for discharge and redness.  Respiratory: Negative for cough, sputum production, shortness of breath and wheezing.   Cardiovascular: Negative for chest pain, palpitations, orthopnea, claudication, leg swelling and PND.  Gastrointestinal: Negative for abdominal pain, blood in stool, heartburn, melena, nausea and vomiting.  Musculoskeletal: Negative for falls and myalgias.  Skin: Negative for rash.  Neurological: Negative for dizziness, tingling, tremors, sensory change, speech change, focal weakness, loss of consciousness and weakness.  Endo/Heme/Allergies: Does not bruise/bleed easily.  Psychiatric/Behavioral: Negative for substance abuse. The patient is not nervous/anxious.   All other systems reviewed and are negative.    EKGs/Labs/Other Studies Reviewed:    Studies reviewed were summarized above. The additional studies were reviewed today:  LHC 08/2015: Coronary dominance: Right   Left mainstem:  Large vessel that bifurcates into the LAD and left circumflex, no significant disease noted   Left anterior descending (LAD):  Large vessel that extends to the apical region, diagonal branch 2 of moderate size,  There is 50% mid LAD disease, long calcified region   Left circumflex (LCx): Large vessel with OM branch 2,  OM 2 is occluded in the proximal region  Distal OM 2 vessel fills via left to left collaterals   Right coronary  artery (RCA): Right dominant vessel with PL and PDA,  PDA is occluded proximally, filled by left to right collaterals   Left ventriculography: Left ventricular systolic function is normal, LVEF is estimated at 45 to 50%, there is no significant mitral regurgitation , no significant aortic valve stenosis   Final Conclusions:   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 Fletcher Anon. Given profound anemia, patient  currently not having chest pain, event likely happening more than 2 days ago (that is when chest pain symptoms presented), troponins already peaked, also OM 2 and you relatively small to moderate size vessel, recommended medical management    Recommendations:  Recommended smoking cessation  Medical management of his coronary disease  Recommend aspirin 81 mg daily and brilinta 90 mg BID  __________  2D echo 08/2015: - Left ventricle: The cavity size was normal. There was mild focal  basal hypertrophy of the septum. Systolic function was normal.  The estimated ejection fraction was in the range of 50% to 55%.  Hypokinesis of the lateral myocardium. Hypokinesis of the  inferior myocardium. Hypokinesis of the inferolateral myocardium.  Left ventricular diastolic function parameters were normal.  - Mitral valve: There was mild to moderate regurgitation.  - Left atrium: The atrium was mildly dilated.  - Right ventricle: Systolic function was normal.  - Pulmonary arteries: Systolic pressure was mildly elevated. PA  peak pressure: 42 mm Hg (S).   EKG:  EKG is ordered today.  The EKG ordered today demonstrates sinus tachycardia, 101 bpm, possible prior inferior and anterolateral infarct, unchanged from prior  Recent Labs: 07/12/2019: B Natriuretic Peptide 720.0; Hemoglobin 16.2; Platelets 202 07/21/2019: BUN 25; Creatinine, Ser 2.02; Potassium 4.4; Sodium 137    Recent Lipid Panel    Component Value Date/Time   CHOL 189 07/21/2019 0928   CHOL 179 02/04/2018 1944   TRIG 82 07/21/2019 0928   HDL 55 07/21/2019 0928   HDL 48 02/04/2018 1944   CHOLHDL 3.4 07/21/2019 0928   VLDL 16 07/21/2019 0928   LDLCALC 118 (H) 07/21/2019 0928   LDLCALC 112 (H) 02/04/2018 1944    PHYSICAL EXAM:    VS:  BP (!) 150/80 (BP Location: Left Arm, Patient Position: Sitting, Cuff Size: Large)   Pulse (!) 101   Ht 6\' 1"  (1.854 m)   Wt 247 lb 2 oz (112.1 kg)   SpO2 95%   BMI 32.60 kg/m   BMI: Body mass index is 32.6 kg/m.  Physical Exam  Constitutional: He is oriented to person, place, and time. He appears well-developed and well-nourished.  HENT:  Head: Normocephalic and atraumatic.  Eyes: Right eye exhibits no discharge. Left eye exhibits no discharge.  Neck: No JVD present.  Cardiovascular: Normal rate, regular rhythm, S1 normal, S2 normal and normal heart sounds. Exam reveals no distant heart sounds, no friction rub, no midsystolic click and no opening snap.  No murmur heard. Pulses:      Posterior tibial pulses are 2+ on the right side and 2+ on the left side.  Pulmonary/Chest: Effort normal and breath sounds normal. No respiratory distress. He has no decreased breath sounds. He has no wheezes. He has no rales. He exhibits no tenderness.  Abdominal: Soft. He exhibits no distension. There is no abdominal tenderness.  Musculoskeletal:        General: No edema.     Cervical back: Normal range of motion.     Comments: Changes consistent with chronic woody edema noted  Neurological: He is alert and oriented to person, place, and time.  Skin: Skin is warm and dry. No cyanosis. Nails show no clubbing.  Psychiatric: He has a normal mood and affect. His speech is normal and behavior is normal. Judgment and thought content normal.    Wt Readings from Last 3 Encounters:  09/12/19 247 lb 2 oz (112.1 kg)  08/03/19 270 lb (122.5 kg)  07/22/19 280 lb 4 oz (  127.1  kg)     ASSESSMENT & PLAN:   1. HFrEF: He is euvolemic, well compensated and NYHA class II.  His weight is down 33 pounds since his visit on 07/22/2019.  He was noted to have AKI on most recent labs obtained by PCP as outlined below leading to the holding of his torsemide at that time.  We will also hold his lisinopril at this time.  Recent echo from 09/08/2019 showed a new cardiomyopathy with an EF of 30 to 35% (previously low normal at 50 to 55%).  In this setting, he will require diagnostic R/LHC to further evaluate his cardiomyopathy.  However, this will need to be deferred until his renal function improves and until after we have established if there is any further thrombus in the right lower extremity with follow-up venous ultrasound.  In an effort to optimize GDMT start carvedilol 6.25 mg twice daily and hydralazine 50 mg twice daily.  When he is seen in follow-up in 2 weeks recommend continued titration of carvedilol as tolerated as well as potential addition of isosorbide.  Currently unable to place him on Entresto or spironolactone secondary to AKI.  Look to optimize these medications as able moving forward.  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  This was discussed with him in detail.  Unfortunately, he does not have any disability plan.  Offered FMLA assistance.  CHF education discussed.  2. CAD involving the native coronary arteries: Prior cath from 2017 as outlined above.  No symptoms concerning for angina.  Remains on aspirin and Lipitor.  Lisinopril held in the setting of AKI.  Add carvedilol.  Moving forward, he will need diagnostic R/LHC as previously outlined.  3. AKI: Likely in the setting of overdiuresis.  Torsemide has been held by PCP last week.  Hold lisinopril.   Recheck BMP on 09/23/2019, prior to his appointment with me the following week.  4. DVT/PE: Patient is a truck driver, therefore his recently diagnosed recurrent DVT in 06/2019, is provoked.  Remains on Eliquis for PCP.  Given the need for diagnostic R/LHC secondary to his cardiomyopathy, prior to scheduling cath recommend obtaining a lower extremity venous duplex to evaluate for persistent thrombus which would help guide need for potential bridging as he will need to come off his anticoagulation prior to cath.  5. HTN: Blood pressure is mildly elevated today.  Given AKI we have stopped lisinopril in addition to the previously discontinued torsemide.  In this setting, start carvedilol 6.25 mg twice daily as well as hydralazine 50 mg twice daily.  Low-sodium diet recommended.  6. HLD: LDL of 118 from 07/2019 with goal LDL being less than 70.  Most recent ALT normal with minimally elevated AST.  Remains on atorvastatin 40 mg daily.  Recommend follow-up fasting lipid panel and liver function in approximately 8 weeks.  7. Tobacco use: Complete cessation is recommended.  Disposition: F/u with Dr. Garen Perry or an APP in 2 weeks.   Medication Adjustments/Labs and Tests Ordered: Current medicines are reviewed at length with the patient today.  Concerns regarding medicines are outlined above. Medication changes, Labs and Tests ordered today are summarized above and listed in the Patient Instructions accessible in Encounters.   Signed, Christell Faith, PA-C 09/12/2019 11:39 AM  [address] [place], [place] [address] [phone]

## 2019-09-12 ENCOUNTER — Other Ambulatory Visit: Payer: Self-pay

## 2019-09-12 ENCOUNTER — Other Ambulatory Visit: Payer: Self-pay | Admitting: Internal Medicine

## 2019-09-12 ENCOUNTER — Encounter: Payer: Self-pay | Admitting: Physician Assistant

## 2019-09-12 ENCOUNTER — Ambulatory Visit (INDEPENDENT_AMBULATORY_CARE_PROVIDER_SITE_OTHER): Payer: BC Managed Care – PPO | Admitting: Physician Assistant

## 2019-09-12 VITALS — BP 150/80 | HR 101 | Ht 73.0 in | Wt 247.1 lb

## 2019-09-12 DIAGNOSIS — I5020 Unspecified systolic (congestive) heart failure: Secondary | ICD-10-CM

## 2019-09-12 DIAGNOSIS — I2510 Atherosclerotic heart disease of native coronary artery without angina pectoris: Secondary | ICD-10-CM

## 2019-09-12 DIAGNOSIS — E785 Hyperlipidemia, unspecified: Secondary | ICD-10-CM

## 2019-09-12 DIAGNOSIS — E860 Dehydration: Secondary | ICD-10-CM

## 2019-09-12 DIAGNOSIS — I10 Essential (primary) hypertension: Secondary | ICD-10-CM

## 2019-09-12 DIAGNOSIS — N179 Acute kidney failure, unspecified: Secondary | ICD-10-CM

## 2019-09-12 DIAGNOSIS — F17200 Nicotine dependence, unspecified, uncomplicated: Secondary | ICD-10-CM

## 2019-09-12 DIAGNOSIS — N289 Disorder of kidney and ureter, unspecified: Secondary | ICD-10-CM

## 2019-09-12 DIAGNOSIS — I82401 Acute embolism and thrombosis of unspecified deep veins of right lower extremity: Secondary | ICD-10-CM

## 2019-09-12 DIAGNOSIS — Z86711 Personal history of pulmonary embolism: Secondary | ICD-10-CM

## 2019-09-12 MED ORDER — HYDRALAZINE HCL 50 MG PO TABS: 50.0000 mg | ORAL_TABLET | Freq: Two times a day (BID) | ORAL | 3 refills | Status: DC

## 2019-09-12 MED ORDER — CARVEDILOL 6.25 MG PO TABS: 6.2500 mg | ORAL_TABLET | Freq: Two times a day (BID) | ORAL | 3 refills | Status: DC

## 2019-09-12 NOTE — Patient Instructions (Signed)
Medication Instructions:  1- STOP Lisinopril 2- STOP Torsemide 3- START Coreg Take 1 tablet (6.25 mg total) by mouth 2 (two) times daily 4- START Hydralazine Take 1 tablet (50 mg total) by mouth 2 (two) times daily *If you need a refill on your cardiac medications before your next appointment, please call your pharmacy*   Lab Work: Your physician recommends that you return for lab work on or around Apr 9th at the medical mall. ([facility]) No appt is needed. Hours are M-F 7AM- 6 PM.  If you have labs (blood work) drawn today and your tests are completely normal, you will receive your results only by: Marland Kitchen MyChart Message (if you have MyChart) OR . A paper copy in the mail If you have any lab test that is abnormal or we need to change your treatment, we will call you to review the results.   Testing/Procedures: None ordered    Follow-Up: At [facility], you and your health needs are our priority.  As part of our continuing mission to provide you with exceptional heart care, we have created designated Provider Care Teams.  These Care Teams include your primary Cardiologist (physician) and Advanced Practice Providers (APPs -  Physician Assistants and Nurse Practitioners) who all work together to provide you with the care you need, when you need it.  We recommend signing up for the patient portal called "MyChart".  Sign up information is provided on this After Visit Summary.  MyChart is used to connect with patients for Virtual Visits (Telemedicine).  Patients are able to view lab/test results, encounter notes, upcoming appointments, etc.  Non-urgent messages can be sent to your provider as well.   To learn more about what you can do with MyChart, go to [email or web address].    Your next appointment:   See appt time

## 2019-09-13 ENCOUNTER — Telehealth: Payer: Self-pay | Admitting: Physician Assistant

## 2019-09-13 ENCOUNTER — Other Ambulatory Visit: Payer: Self-pay | Admitting: *Deleted

## 2019-09-13 MED ORDER — HYDRALAZINE HCL 50 MG PO TABS: 50.0000 mg | ORAL_TABLET | Freq: Two times a day (BID) | ORAL | 3 refills | Status: DC

## 2019-09-13 MED ORDER — CARVEDILOL 6.25 MG PO TABS: 6.2500 mg | ORAL_TABLET | Freq: Two times a day (BID) | ORAL | 3 refills | Status: DC

## 2019-09-13 NOTE — Telephone Encounter (Signed)
Requested Prescriptions   Signed Prescriptions Disp Refills  . carvedilol (COREG) 6.25 MG tablet 180 tablet 3    Sig: Take 1 tablet (6.25 mg total) by mouth 2 (two) times daily.    Authorizing Provider: Rise Mu    Ordering User: Britt Bottom hydrALAZINE (APRESOLINE) 50 MG tablet 180 tablet 3    Sig: Take 1 tablet (50 mg total) by mouth 2 (two) times daily.    Authorizing Provider: Rise Mu    Ordering User: Britt Bottom

## 2019-09-13 NOTE — Telephone Encounter (Signed)
*  STAT* If patient is at the pharmacy, call can be transferred to refill team.   1. Which medications need to be refilled? (please list name of each medication and dose if known)    Carvedilol 6.25 mg po BID   Hydralazine 50 mg po BID  2. Which pharmacy/location (including street and city if local pharmacy) is medication to be sent to?     [place] - [facility] [address] [place]   3. Do they need a 30 day or 90 day supply? [place]

## 2019-09-13 NOTE — Telephone Encounter (Signed)
hydrALAZINE (APRESOLINE) 50 MG tablet 180 tablet 3 09/13/2019 12/12/2019   Sig - Route: Take 1 tablet (50 mg total) by mouth 2 (two) times daily. - Oral   Sent to pharmacy as: hydrALAZINE (APRESOLINE) 50 MG tablet   E-Prescribing Status: Receipt confirmed by pharmacy (09/13/2019 10:34 AM EDT)

## 2019-09-13 NOTE — Telephone Encounter (Signed)
carvedilol (COREG) 6.25 MG tablet 180 tablet 3 09/13/2019    Sig - Route: Take 1 tablet (6.25 mg total) by mouth 2 (two) times daily. - Oral   Sent to pharmacy as: carvedilol (COREG) 6.25 MG tablet   E-Prescribing Status: Receipt confirmed by pharmacy (09/13/2019 10:34 AM EDT)

## 2019-09-26 ENCOUNTER — Other Ambulatory Visit
Admission: RE | Admit: 2019-09-26 | Discharge: 2019-09-26 | Disposition: A | Discharge status: Home / Self Care | Payer: BC Managed Care – PPO | Source: Ambulatory Visit | Attending: Physician Assistant | Admitting: Physician Assistant

## 2019-09-26 DIAGNOSIS — I5020 Unspecified systolic (congestive) heart failure: Secondary | ICD-10-CM | POA: Diagnosis present

## 2019-09-26 LAB — BASIC METABOLIC PANEL
Anion gap: 10 (ref 5–15)
BUN: 23 mg/dL (ref 8–23)
CO2: 23 mmol/L (ref 22–32)
Calcium: 8.3 mg/dL — ABNORMAL LOW (ref 8.9–10.3)
Chloride: 102 mmol/L (ref 98–111)
Creatinine, Ser: 1.37 mg/dL — ABNORMAL HIGH (ref 0.61–1.24)
GFR calc Af Amer: >60 mL/min (ref >60)
GFR calc non Af Amer: 55 mL/min — ABNORMAL LOW (ref >60)
Glucose, Bld: 126 mg/dL — ABNORMAL HIGH (ref 70–99)
Potassium: 4.4 mmol/L (ref 3.5–5.1)
Sodium: 135 mmol/L (ref 135–145)

## 2019-09-30 ENCOUNTER — Ambulatory Visit (INDEPENDENT_AMBULATORY_CARE_PROVIDER_SITE_OTHER): Payer: BC Managed Care – PPO | Admitting: Cardiology

## 2019-09-30 ENCOUNTER — Encounter: Payer: Self-pay | Admitting: Cardiology

## 2019-09-30 ENCOUNTER — Other Ambulatory Visit: Payer: Self-pay

## 2019-09-30 VITALS — BP 110/64 | HR 82 | Ht 73.0 in | Wt 253.4 lb

## 2019-09-30 DIAGNOSIS — I2510 Atherosclerotic heart disease of native coronary artery without angina pectoris: Secondary | ICD-10-CM

## 2019-09-30 DIAGNOSIS — E785 Hyperlipidemia, unspecified: Secondary | ICD-10-CM | POA: Diagnosis not present

## 2019-09-30 DIAGNOSIS — I82411 Acute embolism and thrombosis of right femoral vein: Secondary | ICD-10-CM | POA: Diagnosis not present

## 2019-09-30 DIAGNOSIS — I5020 Unspecified systolic (congestive) heart failure: Secondary | ICD-10-CM | POA: Diagnosis not present

## 2019-09-30 DIAGNOSIS — Z01812 Encounter for preprocedural laboratory examination: Secondary | ICD-10-CM

## 2019-09-30 MED ORDER — TORSEMIDE 20 MG PO TABS: ORAL_TABLET | ORAL | Status: DC

## 2019-09-30 NOTE — Addendum Note (Signed)
Addended by: Kate Sable on: 09/30/2019 12:52 PM   Modules accepted: Orders, SmartSet

## 2019-09-30 NOTE — Patient Instructions (Addendum)
Medication Instructions:  -  Your physician has recommended you make the following change in your medication:   1) Resume torsemide 20 mg- take 2 tablets (40 mg) by mouth  ONCE EVERY OTHER DAY  2) Increase eliquis 5 mg- take 1 tablet by mouth TWICE DAILY   *If you need a refill on your cardiac medications before your next appointment, please call your pharmacy*   Lab Work:  1st- Pre procedure lab work: Thursday 10/13/19 (7:30 am- 12:30 pm) - [place] entrance at [facility], 1st desk on the right to check in for labs (just past the screening table)  2nd- Pre-procedure COVID swab: Thursday 10/13/19 (8 am- 1 pm) - [place] entrance at [facility], drive up test only  If you have labs (blood work) drawn today and your tests are completely normal, you will receive your results only by: Marland Kitchen MyChart Message (if you have MyChart) OR . A paper copy in the mail If you have any lab test that is abnormal or we need to change your treatment, we will call you to review the results.   Testing/Procedures: - Your physician has requested that you have a lower extremity venous duplex. This test is an ultrasound of the veins in the legs. It looks at venous blood flow that carries blood from the heart to the legs. Allow one hour for a Lower Venous exam.There are no restrictions or special instructions.   - Your physician has requested that you have a cardiac catheterization. Cardiac catheterization is used to diagnose and/or treat various heart conditions. Doctors may recommend this procedure for a number of different reasons. The most common reason is to evaluate chest pain. Chest pain can be a symptom of coronary artery disease (CAD), and cardiac catheterization can show whether plaque is narrowing or blocking your heart's arteries. This procedure is also used to evaluate the valves, as well as measure the blood flow and oxygen levels in different parts of your heart.      Freder[place] Perry, [place] [id number] Dept: [phone] Loc: [place]  09/30/2019  You are scheduled for a Cardiac Catheterization on Monday, May 3 with Dr. Kathlyn Sacramento.  1. Please arrive at the [place] entrance of [facility] at 8:30 AM (This time is one hour before your procedure to ensure your preparation). Free valet parking service is available.   Special note: Every effort is made to have your procedure done on time. Please understand that emergencies sometimes delay scheduled procedures.  2. Diet: Do not eat solid foods after midnight.  You may have clear liquids until 5am upon the day of the procedure.  3. Labs: as above  4. Medication instructions in preparation for your procedure:   Contrast Allergy: No  Hold eliquis for 48 hours prior to your cath (your last dose will be on Friday 10/14/19)  Hold torsemide the morning of your procedure.    On the morning of your procedure, take your Aspirin and any morning medicines NOT listed above.  You may use sips of water.  5. Plan for one night stay--bring personal belongings. 6. Bring a current list of your medications and current insurance cards. 7. You MUST have a responsible person to drive you home. 8. Someone MUST be with you the first 24 hours after you arrive home or your discharge will be delayed. 9. Please wear clothes that are easy to  get on and off and wear slip-on shoes.  Thank you for allowing [place] to care for you!   -- [place] [facility] services    Follow-Up: At [facility], you and your health needs are our priority.  As part of our continuing mission to provide you with exceptional heart care, we have created designated Provider Care Teams.  These Care Teams include your primary Cardiologist (physician) and Advanced Practice Providers (APPs -  Physician Assistants and Nurse Practitioners) who  all work together to provide you with the care you need, when you need it.  We recommend signing up for the patient portal called "MyChart".  Sign up information is provided on this After Visit Summary.  MyChart is used to connect with patients for Virtual Visits (Telemedicine).  Patients are able to view lab/test results, encounter notes, upcoming appointments, etc.  Non-urgent messages can be sent to your provider as well.   To learn more about what you can do with MyChart, go to [email or web address].    Your next appointment:   7-10 days (from 10/17/19)   The format for your next appointment:   In Person  Provider:   Kate Sable, MD   Other Instructions n/a

## 2019-09-30 NOTE — Progress Notes (Signed)
Cardiology Office Note:    Date:  09/30/2019   ID:  Frederick Perry, DOB [birth year], MRN [id number]  PCP:  Gladstone Lighter, MD  Cardiologist:  Kate Sable, MD  Electrophysiologist:  None   Referring MD: Gladstone Lighter, MD   Chief Complaint  Patient presents with  . other    3 wk f/u echo. Meds reviewed verbally with pt.    History of Present Illness:    Frederick Perry is a 64 y.o. male with a hx of hypertension, diabetes, heart failure reduced ejection fraction, last EF 30 to 35%, CAD, smoker, DVT, PE status post IVC filter placement in 2017 who presents for follow-up.  He is being seen due to lower extremity edema. Had AKI, lisinopril was held, torsemide also discontinued. He has noticed increase in edema since stopping torsemide.  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 Upon further questioning, patient not taking Eliquis at correct dose. He takes Eliquis 5 mg daily instead of twice daily. Repeat echocardiogram shows moderate to severely reduced ejection fraction, EF 30 to 35%.   Historical notes Patient is a truck driver and noticed worsening bilateral lower extremity edema.  He was seen at an urgent care and started on 20 mg of Lasix p.o. but symptoms persisted.  Patient was seen in the emergency room on 07/11/2018.  Ultrasound of the lower extremity showed DVT in the mid right femoral vein. Patient was started on Eliquis. Patient states his dry weight is around 145 pounds.  He denies dyspnea, chest pain but endorses 3 pillow orthopnea and worsening lower extremity edema.  His Lasix was switched to torsemide 40 mg twice daily after last visit.  He states feeling much better, his extremity swelling is much improved and he can now tie his shoelaces.   Patient was seen in the [facility] back in 08/2015 for chest pain.  Echocardiogram at the time showed EF of 50 to [id number], diastolic function was normal.  Left heart cath showed occluded OM 2 vessel and occluded PDA  both being filled via collaterals.  Moderate disease noted in the mid LAD.  Medical management was recommended.  He was subsequently diagnosed with DVT and PE on that admission in 08/2015, had an IVC filter placed since patient was anemic with hemoglobin of 4.9 at the time.  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   Past Medical History:  Diagnosis Date  . CHF (congestive heart failure) ([place])   . Coronary artery disease    LHC 2017- occ OM2, occ PDA (both being filled via collaterals), moderate disease in LAD  . Diabetes mellitus without complication ([place])    Not on medications  . Hypertension    Not on medications.stopped since he ran out of medications  . Myocardial infarction ([place])   . Polysubstance abuse ([place])    a. ongoing tobacco and alcohol abuse   . Pulmonary embolism [facility])     Past Surgical History:  Procedure Laterality Date  . BACK SURGERY    . CARDIAC CATHETERIZATION N/A 09/05/2015   Procedure: Left Heart Cath and Coronary Angiography;  Surgeon: Minna Merritts, MD;  Location: [facility];  Service: Cardiovascular;  Laterality: N/A;  . DRAIN REMOVAL Right 12/20/2015   Procedure: DRAIN REMOVAL;  Surgeon: Nickie Retort, MD;  Location: [facility] ORS;  Service: Urology;  Laterality: Right;  by dr copper  . LAPAROTOMY N/A 12/14/2015   Procedure: EXPLORATORY LAPAROTOMY, prepyloric gastric ulcer biopsy;  Surgeon: Jules Husbands, MD;  Location: [facility] ORS;  Service: General;  Laterality: N/A;  . Patellar tendon repair Right   . PERIPHERAL VASCULAR CATHETERIZATION N/A 09/07/2015   Procedure: IVC Filter Insertion;  Surgeon: Algernon Huxley, MD;  Location: [facility];  Service: Cardiovascular;  Laterality: N/A;  . Testicular torsion repair    . TRANSURETHRAL RESECTION OF BLADDER TUMOR N/A 12/20/2015   Procedure: TRANSURETHRAL RESECTION OF BLADDER TUMOR (TURBT);  Surgeon: Nickie Retort, MD;  Location: [facility] ORS;  Service:  Urology;  Laterality: N/A;  . TRANSURETHRAL RESECTION OF BLADDER TUMOR N/A 03/26/2016   Procedure: TRANSURETHRAL RESECTION OF BLADDER TUMOR (TURBT);  Surgeon: Nickie Retort, MD;  Location: [facility] ORS;  Service: Urology;  Laterality: N/A;    Current Medications: Current Meds  Medication Sig  . apixaban (ELIQUIS) 5 MG TABS tablet Take 1 tablet (5 mg) by mouth twice daily   . aspirin EC 81 MG tablet Take 81 mg by mouth daily.  Marland Kitchen atorvastatin (LIPITOR) 40 MG tablet Take 1 tablet (40 mg total) by mouth daily.  . carvedilol (COREG) 6.25 MG tablet Take 1 tablet (6.25 mg total) by mouth 2 (two) times daily.  . hydrALAZINE (APRESOLINE) 50 MG tablet Take 1 tablet (50 mg total) by mouth 2 (two) times daily.     Allergies:   Patient has no known allergies.   Social History   Socioeconomic History  . Marital status: Divorced    Spouse name: Not on file  . Number of children: Not on file  . Years of education: Not on file  . Highest education level: Not on file  Occupational History  . Not on file  Tobacco Use  . Smoking status: Current Every Day Smoker    Packs/day: 0.50    Years: 40.00    Pack years: 20.00    Types: Cigarettes  . Smokeless tobacco: Never Used  Substance and Sexual Activity  . Alcohol use: Yes    Alcohol/week: 10.0 standard drinks    Types: 10 Cans of beer per week    Comment: weekly- beers and occasionally liquor  . Drug use: No  . Sexual activity: Not on file  Other Topics Concern  . Not on file  Social History Narrative   Lives at home with Mother and is his Mothers primary caregiver   Social Determinants of Health   Financial Resource Strain:   . Difficulty of Paying Living Expenses:   Food Insecurity:   . Worried About Charity fundraiser in the Last Year:   . Arboriculturist in the Last Year:   Transportation Needs:   . Film/video editor (Medical):   Marland Kitchen Lack of Transportation (Non-Medical):   Physical Activity:   . Days of Exercise per Week:     . Minutes of Exercise per Session:   Stress:   . Feeling of Stress :   Social Connections:   . Frequency of Communication with Friends and Family:   . Frequency of Social Gatherings with Friends and Family:   . Attends Religious Services:   . Active Member of Clubs or Organizations:   . Attends Archivist Meetings:   Marland Kitchen Marital Status:      Family History: The patient's family history includes Congestive Heart Failure in his mother; Peripheral vascular disease in his father.  ROS:   Please see the history of present illness.     All other systems reviewed and are negative.  EKGs/Labs/Other Studies  Reviewed:    The following studies were reviewed today: TTE 09/10/15 Left ventricle: The cavity size was normal. There was mild focal  basal hypertrophy of the septum. Systolic function was normal.  The estimated ejection fraction was in the range of 50% to 55%.  Hypokinesis of the lateral myocardium. Hypokinesis of the  inferior myocardium. Hypokinesis of the inferolateral myocardium.  Left ventricular diastolic function parameters were normal.  - Mitral valve: There was mild to moderate regurgitation.  - Left atrium: The atrium was mildly dilated.  - Right ventricle: Systolic function was normal.  - Pulmonary arteries: Systolic pressure was mildly elevated. PA  peak pressure: 42 mm Hg (S).   LHC September 10, 2015 Final Conclusions:   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 Fletcher Anon. Given profound anemia, patient currently not having chest pain, event likely happening more than 2 days ago (that is when chest pain symptoms presented), troponins already peaked, also OM 2 and you relatively small to moderate size vessel, recommended medical management  EKG:  EKG is  ordered today.  The ekg ordered today demonstrates sinus rhythm, pac's  Recent Labs: 07/12/2019:  B Natriuretic Peptide 720.0; Hemoglobin 16.2; Platelets 202 09/26/2019: BUN 23; Creatinine, Ser 1.37; Potassium 4.4; Sodium 135  Recent Lipid Panel    Component Value Date/Time   CHOL 189 07/21/2019 0928   CHOL 179 02/04/2018 1944   TRIG 82 07/21/2019 0928   HDL 55 07/21/2019 0928   HDL 48 02/04/2018 1944   CHOLHDL 3.4 07/21/2019 0928   VLDL 16 07/21/2019 0928   LDLCALC 118 (H) 07/21/2019 0928   LDLCALC 112 (H) 02/04/2018 1944    Physical Exam:    VS:  BP 110/64   Pulse 82   Ht 6\' 1"  (1.854 m)   Wt 253 lb 6 oz (114.9 kg)   SpO2 97%   BMI 33.43 kg/m     Wt Readings from Last 3 Encounters:  09/30/19 253 lb 6 oz (114.9 kg)  09/12/19 247 lb 2 oz (112.1 kg)  08/03/19 270 lb (122.5 kg)     GEN:  Well nourished, well developed in no acute distress HEENT: Normal NECK: No JVD; No carotid bruits LYMPHATICS: No lymphadenopathy CARDIAC: RRR, no murmurs, rubs, gallops RESPIRATORY:  Clear to auscultation anteriorly, decreased at bases. ABDOMEN: Soft, non-tender, distended MUSCULOSKELETAL:  2+ edema to thighs; No deformity  SKIN: Warm and dry NEUROLOGIC:  Alert and oriented x 3 PSYCHIATRIC:  Normal affect   ASSESSMENT:    1. HFrEF (heart failure with reduced ejection fraction) ([place])   2. Coronary artery disease involving native coronary artery of native heart without angina pectoris   3. Deep vein thrombosis (DVT) of femoral vein of right lower extremity, unspecified chronicity ([place])   4. Hyperlipidemia LDL goal <70   5. Pre-procedure lab exam    PLAN:    In order of problems listed above:  1. Patient with heart failure reduced ejection fraction, last echo showed worsening ejection fraction with EF 30 to 35%. Prior left heart cath showed occlusion of OM2 and PDA accounting. Concerned patient may have progressive CAD accounting for further reduction in ejection fraction. We will plan for repeat right and left heart cath. Continue Coreg and hydralazine as prescribed. Will  consider BiDil on follow-up visit if creatinine still elevated versus Entresto. Can take torsemide every other day. 2. History of CAD with occlusion  of OM2 and PDA.  Continue aspirin 81 mg, Lipitor 40 mg daily. Repeat left heart cath as above 3. Patient with history of DVT. Was not taking Eliquis at right dosage. Repeat lower extremity venous ultrasound to evaluate presence of DVT. Take Eliquis 5 mg twice daily. Patient advised to hold Eliquis 48 hours prior to left heart cath.  4. History of elevated LDL.  Continue Lipitor as prescribed.    Follow-up after left heart cath and lower extremity ultrasound.  Total encounter time 45 minutes  Greater than 50% was spent in counseling and coordination of care with the patient. Including explaining echo results, reasoning for left heart cath, medication management.   This note was generated in part or whole with voice recognition software. Voice recognition is usually quite accurate but there are transcription errors that can and very often do occur. I apologize for any typographical errors that were not detected and corrected.  Medication Adjustments/Labs and Tests Ordered: Current medicines are reviewed at length with the patient today.  Concerns regarding medicines are outlined above.  Orders Placed This Encounter  Procedures  . Basic metabolic panel  . CBC w/Diff  . EKG 12-Lead  . VAS [place] LOWER EXTREMITY VENOUS (DVT)   Meds ordered this encounter  Medications  . torsemide (DEMADEX) 20 MG tablet    Sig: Take 2 tablets (40 mg) by mouth ONCE EVERY OTHER DAY    Patient Instructions  Medication Instructions:  -  Your physician has recommended you make the following change in your medication:   1) Resume torsemide 20 mg- take 2 tablets (40 mg) by mouth  ONCE EVERY OTHER DAY  2) Increase eliquis 5 mg- take 1 tablet by mouth TWICE DAILY   *If you need a refill on your cardiac medications before your next appointment, please call your  pharmacy*   Lab Work:  1st- Pre procedure lab work: Thursday 10/13/19 (7:30 am- 12:30 pm) - [place] entrance at [facility], 1st desk on the right to check in for labs (just past the screening table)  2nd- Pre-procedure COVID swab: Thursday 10/13/19 (8 am- 1 pm) - [place] entrance at [facility], drive up test only  If you have labs (blood work) drawn today and your tests are completely normal, you will receive your results only by: Marland Kitchen MyChart Message (if you have MyChart) OR . A paper copy in the mail If you have any lab test that is abnormal or we need to change your treatment, we will call you to review the results.   Testing/Procedures: - Your physician has requested that you have a lower extremity venous duplex. This test is an ultrasound of the veins in the legs. It looks at venous blood flow that carries blood from the heart to the legs. Allow one hour for a Lower Venous exam.There are no restrictions or special instructions.   - Your physician has requested that you have a cardiac catheterization. Cardiac catheterization is used to diagnose and/or treat various heart conditions. Doctors may recommend this procedure for a number of different reasons. The most common reason is to evaluate chest pain. Chest pain can be a symptom of coronary artery disease (CAD), and cardiac catheterization can show whether plaque is narrowing or blocking your heart's arteries. This procedure is also used to evaluate the valves, as well as measure the blood flow and oxygen levels in different parts of your heart.      [place]  [address], [place] [address] Dept: [phone] Loc: [place]  09/30/2019  You are scheduled for a Cardiac Catheterization on Monday, May 3 with Dr. Kathlyn Sacramento.  1. Please arrive at the [place] entrance of [facility] at 8:30 AM (This time is  one hour before your procedure to ensure your preparation). Free valet parking service is available.   Special note: Every effort is made to have your procedure done on time. Please understand that emergencies sometimes delay scheduled procedures.  2. Diet: Do not eat solid foods after midnight.  You may have clear liquids until 5am upon the day of the procedure.  3. Labs: as above  4. Medication instructions in preparation for your procedure:   Contrast Allergy: No  Hold eliquis for 48 hours prior to your cath (your last dose will be on Friday 10/14/19)  Hold torsemide the morning of your procedure.    On the morning of your procedure, take your Aspirin and any morning medicines NOT listed above.  You may use sips of water.  5. Plan for one night stay--bring personal belongings. 6. Bring a current list of your medications and current insurance cards. 7. You MUST have a responsible person to drive you home. 8. Someone MUST be with you the first 24 hours after you arrive home or your discharge will be delayed. 9. Please wear clothes that are easy to get on and off and wear slip-on shoes.  Thank you for allowing [place] to care for you!   -- [place] [facility] Cardiovascular services    Follow-Up: At [facility], you and your health needs are our priority.  As part of our continuing mission to provide you with exceptional heart care, we have created designated Provider Care Teams.  These Care Teams include your primary Cardiologist (physician) and Advanced Practice Providers (APPs -  Physician Assistants and Nurse Practitioners) who all work together to provide you with the care you need, when you need it.  We recommend signing up for the patient portal called "MyChart".  Sign up information is provided on this After Visit Summary.  MyChart is used to connect with patients for Virtual Visits (Telemedicine).  Patients are able to view lab/test results, encounter notes, upcoming  appointments, etc.  Non-urgent messages can be sent to your provider as well.   To learn more about what you can do with MyChart, go to [email or web address].    Your next appointment:   7-10 days (from 10/17/19)   The format for your next appointment:   In Person  Provider:   Kate Sable, MD   Other Instructions n/a     Signed, Kate Sable, MD  09/30/2019 11:58 AM    [place]

## 2019-10-03 ENCOUNTER — Ambulatory Visit: Payer: BC Managed Care – PPO | Admitting: Family

## 2019-10-03 ENCOUNTER — Telehealth: Payer: Self-pay | Admitting: Family

## 2019-10-03 NOTE — Telephone Encounter (Signed)
Patient did not show for his [facility] Failure [facility] appointment on 10/03/19. Will attempt to reschedule.

## 2019-10-13 ENCOUNTER — Inpatient Hospital Stay: Admission: RE | Admit: 2019-10-13 | Payer: BC Managed Care – PPO | Source: Ambulatory Visit

## 2019-10-17 ENCOUNTER — Encounter: Admission: RE | Payer: Self-pay | Source: Home / Self Care

## 2019-10-17 ENCOUNTER — Ambulatory Visit
Admission: RE | Admit: 2019-10-17 | Payer: BC Managed Care – PPO | Source: Home / Self Care | Admitting: Cardiovascular Disease

## 2019-10-17 SURGERY — RIGHT/LEFT HEART CATH AND CORONARY ANGIOGRAPHY
Anesthesia: Moderate Sedation | Laterality: Bilateral

## 2019-10-17 NOTE — Progress Notes (Signed)
Pt did not show up for his Covid testing appointment last week. Mr. Mckechnie called and per Patient he was unable to attend due to work, and he was not planning on coming today for his procedure also due to schedule conflict at work. Pt encouraged to call office to reschedule Covid testing appt and Heart Cath when he is available. Dr. Fletcher Anon notified.

## 2019-10-20 ENCOUNTER — Telehealth: Payer: Self-pay | Admitting: Cardiology

## 2019-10-20 NOTE — Telephone Encounter (Signed)
The patient no showed for his Right & Left heart cath on 10/17/19. I attempted to call the patient to see if he would like to reschedule this. No answer- I left a detailed message to please call back and let us know if he would like to reschedule his cath or not.

## 2019-10-24 NOTE — Telephone Encounter (Signed)
I attempted to call the patient. °No answer- I left a message to please call back.  °

## 2019-10-26 NOTE — Telephone Encounter (Signed)
3rd attempt to contact the patient. Lmtcb. Patient has an appt with Dr. Garen Lah on 10/27/19. Closing this encounter.

## 2019-10-27 ENCOUNTER — Ambulatory Visit: Payer: BC Managed Care – PPO | Admitting: Cardiology

## 2019-10-28 ENCOUNTER — Encounter: Payer: Self-pay | Admitting: Cardiology

## 2020-09-20 ENCOUNTER — Other Ambulatory Visit: Payer: Self-pay | Admitting: Physician Assistant

## 2020-09-20 NOTE — Telephone Encounter (Signed)
LVM for patient to schedule overdue fu 

## 2020-09-20 NOTE — Telephone Encounter (Signed)
Please schedule overdue office visit. Thank you!

## 2020-09-21 NOTE — Telephone Encounter (Signed)
Patient unavailable he is out of town driving and is now in [place] .  Patient will call back when available.

## 2020-09-21 NOTE — Telephone Encounter (Signed)
Please advise if OK to refill. Patient last seen 09/2019 and was to F/U in 7-10 days. Also see below message-patient did not schedule when called by front office staff because he is driving to [place]. Thank you!

## 2020-09-27 NOTE — Telephone Encounter (Signed)
Attempted to schedule. 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 Patient states he will call once he returns

## 2020-11-02 ENCOUNTER — Other Ambulatory Visit: Payer: Self-pay | Admitting: Physician Assistant

## 2020-12-18 ENCOUNTER — Other Ambulatory Visit: Payer: Self-pay | Admitting: *Deleted

## 2020-12-18 NOTE — Telephone Encounter (Signed)
Attempted to schedule.  LMOV to call office.  ° °

## 2020-12-26 ENCOUNTER — Other Ambulatory Visit: Payer: Self-pay

## 2020-12-26 MED ORDER — HYDRALAZINE HCL 50 MG PO TABS
50.0000 mg | ORAL_TABLET | Freq: Two times a day (BID) | ORAL | 0 refills | Status: DC
  Filled 2020-12-26: qty 15, 8d supply, fill #0

## 2020-12-26 MED ORDER — CARVEDILOL 6.25 MG PO TABS
6.2500 mg | ORAL_TABLET | Freq: Two times a day (BID) | ORAL | 0 refills | Status: DC
  Filled 2020-12-26: qty 15, 8d supply, fill #0

## 2021-01-01 ENCOUNTER — Observation Stay
Admission: EM | Admit: 2021-01-01 | Discharge: 2021-01-02 | Disposition: A | Discharge status: Home / Self Care | Payer: BC Managed Care – PPO | Attending: Hospitalist | Admitting: Hospitalist

## 2021-01-01 ENCOUNTER — Other Ambulatory Visit: Payer: Self-pay

## 2021-01-01 ENCOUNTER — Encounter: Payer: Self-pay | Admitting: Cardiology

## 2021-01-01 ENCOUNTER — Emergency Department: Payer: BC Managed Care – PPO

## 2021-01-01 DIAGNOSIS — F17210 Nicotine dependence, cigarettes, uncomplicated: Secondary | ICD-10-CM | POA: Diagnosis not present

## 2021-01-01 DIAGNOSIS — Z79899 Other long term (current) drug therapy: Secondary | ICD-10-CM | POA: Insufficient documentation

## 2021-01-01 DIAGNOSIS — I5042 Chronic combined systolic (congestive) and diastolic (congestive) heart failure: Secondary | ICD-10-CM | POA: Insufficient documentation

## 2021-01-01 DIAGNOSIS — R002 Palpitations: Secondary | ICD-10-CM | POA: Diagnosis present

## 2021-01-01 DIAGNOSIS — I110 Hypertensive heart disease with heart failure: Secondary | ICD-10-CM | POA: Insufficient documentation

## 2021-01-01 DIAGNOSIS — Z20822 Contact with and (suspected) exposure to covid-19: Secondary | ICD-10-CM | POA: Diagnosis not present

## 2021-01-01 DIAGNOSIS — E119 Type 2 diabetes mellitus without complications: Secondary | ICD-10-CM | POA: Insufficient documentation

## 2021-01-01 DIAGNOSIS — Z8551 Personal history of malignant neoplasm of bladder: Secondary | ICD-10-CM | POA: Insufficient documentation

## 2021-01-01 DIAGNOSIS — I2510 Atherosclerotic heart disease of native coronary artery without angina pectoris: Secondary | ICD-10-CM | POA: Diagnosis not present

## 2021-01-01 DIAGNOSIS — Z7901 Long term (current) use of anticoagulants: Secondary | ICD-10-CM | POA: Diagnosis not present

## 2021-01-01 DIAGNOSIS — Z86711 Personal history of pulmonary embolism: Secondary | ICD-10-CM | POA: Insufficient documentation

## 2021-01-01 DIAGNOSIS — I214 Non-ST elevation (NSTEMI) myocardial infarction: Secondary | ICD-10-CM | POA: Diagnosis not present

## 2021-01-01 DIAGNOSIS — Z7982 Long term (current) use of aspirin: Secondary | ICD-10-CM | POA: Diagnosis not present

## 2021-01-01 DIAGNOSIS — F1910 Other psychoactive substance abuse, uncomplicated: Secondary | ICD-10-CM | POA: Diagnosis not present

## 2021-01-01 LAB — CBC
HCT: 43.4 % (ref 39.0–52.0)
Hemoglobin: 14.7 g/dL (ref 13.0–17.0)
MCH: 34.4 pg — ABNORMAL HIGH (ref 26.0–34.0)
MCHC: 33.9 g/dL (ref 30.0–36.0)
MCV: 101.6 fL — ABNORMAL HIGH (ref 80.0–100.0)
Platelets: 152 10*3/uL (ref 150–400)
RBC: 4.27 MIL/uL (ref 4.22–5.81)
RDW: 15.1 % (ref 11.5–15.5)
WBC: 5.1 10*3/uL (ref 4.0–10.5)
nRBC: 0 % (ref 0.0–0.2)

## 2021-01-01 LAB — BASIC METABOLIC PANEL
Anion gap: 13 (ref 5–15)
BUN: 21 mg/dL (ref 8–23)
CO2: 26 mmol/L (ref 22–32)
Calcium: 8 mg/dL — ABNORMAL LOW (ref 8.9–10.3)
Chloride: 103 mmol/L (ref 98–111)
Creatinine, Ser: 1.14 mg/dL (ref 0.61–1.24)
GFR, Estimated: >60 mL/min (ref >60)
Glucose, Bld: 91 mg/dL (ref 70–99)
Potassium: 3.8 mmol/L (ref 3.5–5.1)
Sodium: 142 mmol/L (ref 135–145)

## 2021-01-01 LAB — TSH: TSH: 1.431 u[IU]/mL (ref 0.350–4.500)

## 2021-01-01 LAB — RESP PANEL BY RT-PCR (FLU A&B, COVID) ARPGX2
Influenza A by PCR: NEGATIVE
Influenza B by PCR: NEGATIVE
SARS Coronavirus 2 by RT PCR: NEGATIVE

## 2021-01-01 LAB — MAGNESIUM: Magnesium: 1.4 mg/dL — ABNORMAL LOW (ref 1.7–2.4)

## 2021-01-01 LAB — TROPONIN I (HIGH SENSITIVITY)
Troponin I (High Sensitivity): 99 ng/L — ABNORMAL HIGH (ref <18)
Troponin I (High Sensitivity): 119 ng/L (ref <18)
Troponin I (High Sensitivity): 131 ng/L (ref <18)

## 2021-01-01 LAB — HIV ANTIBODY (ROUTINE TESTING W REFLEX): HIV Screen 4th Generation wRfx: NONREACTIVE

## 2021-01-01 MED ORDER — HYDRALAZINE HCL 20 MG/ML IJ SOLN: 10.0000 mg | Freq: Four times a day (QID) | INTRAMUSCULAR | Status: DC | PRN

## 2021-01-01 MED ORDER — ACETAMINOPHEN 500 MG PO TABS: 1000.0000 mg | ORAL_TABLET | Freq: Three times a day (TID) | ORAL | Status: DC | PRN

## 2021-01-01 MED ORDER — CARVEDILOL 6.25 MG PO TABS
6.2500 mg | ORAL_TABLET | Freq: Two times a day (BID) | ORAL | Status: DC
  Administered 2021-01-01 – 2021-01-02 (×2): 6.25 mg via ORAL
  Filled 2021-01-01 (×2): qty 1

## 2021-01-01 MED ORDER — HYDRALAZINE HCL 50 MG PO TABS
50.0000 mg | ORAL_TABLET | Freq: Two times a day (BID) | ORAL | Status: DC
  Administered 2021-01-01 – 2021-01-02 (×2): 50 mg via ORAL
  Filled 2021-01-01 (×2): qty 1

## 2021-01-01 MED ORDER — ALUM & MAG HYDROXIDE-SIMETH 200-200-20 MG/5ML PO SUSP: 15.0000 mL | Freq: Four times a day (QID) | ORAL | Status: DC | PRN

## 2021-01-01 MED ORDER — CALCIUM CARBONATE ANTACID 500 MG PO CHEW: 1.0000 | CHEWABLE_TABLET | Freq: Three times a day (TID) | ORAL | Status: DC | PRN

## 2021-01-01 MED ORDER — ASPIRIN 81 MG PO CHEW
243.0000 mg | CHEWABLE_TABLET | Freq: Once | ORAL | Status: AC
  Administered 2021-01-01: 243 mg via ORAL
  Filled 2021-01-01: qty 3

## 2021-01-01 MED ORDER — CARVEDILOL 6.25 MG PO TABS: 6.2500 mg | ORAL_TABLET | Freq: Two times a day (BID) | ORAL | Status: DC

## 2021-01-01 MED ORDER — ASPIRIN EC 81 MG PO TBEC
81.0000 mg | DELAYED_RELEASE_TABLET | Freq: Every day | ORAL | Status: DC
  Administered 2021-01-02: 81 mg via ORAL
  Filled 2021-01-01: qty 1

## 2021-01-01 MED ORDER — APIXABAN 5 MG PO TABS
5.0000 mg | ORAL_TABLET | Freq: Two times a day (BID) | ORAL | Status: DC
  Administered 2021-01-01 – 2021-01-02 (×2): 5 mg via ORAL
  Filled 2021-01-01 (×2): qty 1

## 2021-01-01 MED ORDER — MAGNESIUM SULFATE 4 GM/100ML IV SOLN
4.0000 g | Freq: Once | INTRAVENOUS | Status: AC
  Administered 2021-01-01: 4 g via INTRAVENOUS
  Filled 2021-01-01: qty 100

## 2021-01-01 MED ORDER — ATORVASTATIN CALCIUM 20 MG PO TABS
40.0000 mg | ORAL_TABLET | Freq: Every day | ORAL | Status: DC
  Administered 2021-01-02: 40 mg via ORAL
  Filled 2021-01-01: qty 2

## 2021-01-01 NOTE — ED Notes (Signed)
Care transferred, report received from [place], [place]

## 2021-01-01 NOTE — H&P (Signed)
History and Physical    Frederick Perry RSW:[id number] DOB: [birth year] DOA: 01/01/2021  PCP: Gladstone Lighter, MD  Patient coming from: home  I have personally briefly reviewed patient's old medical records in [place]  Chief Complaint: heart skipping beats  HPI: Frederick Perry is a 65 y.o. male with medical history significant of hypertension, diabetes, heart failure reduced ejection fraction, last EF 30 to 35%, CAD, smoker, DVT, PE status post IVC filter placement in 2017 currently on Eliquis who presented with feeling of his heart skipping beats.   Pt started feeling his heart skipping beats last night, and felt it again this morning also, and pt figured since it's related to his heart, he should have it checked out in the ED.  Never felt his heart skipping beats before.  No chest pain.  No dyspnea.  All other ROS neg.  Pt said he was otherwise at his baseline health.   ED Course: initial vitals: afebrile, pulse 89, BP 140/101, RR 17, sating 96% on room air.  Labs notable for initial trop 99.  EKG showed sinus with PAC's.  2nd trop returned 119.  ED provider requested pt to be admitted for overnight observation.    Assessment/Plan Active Problems:   Palpitation  # Heart palpitation 2/2 PAC's --frequent, captured on EKG and also auscultation.   --monitor on tele  # Troponin elevation --99 and 119, flat, no chest pain, unlikely to be ACS.   --will touch base with pt's cardiology group in the morning.  # Chronic combined CHF --last EF 30 to 35%.  Not in exacerbation. 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   Past Medical History:  Diagnosis Date   CHF (congestive heart failure) ([place])    Coronary artery disease    LHC 2017- occ OM2, occ PDA (both being filled via collaterals), moderate disease in LAD   Diabetes mellitus without complication ([place])    Not on medications   Hypertension    Not on medications.stopped since he ran out of medications   Myocardial infarction [facility])    Polysubstance abuse ([place])    a. ongoing tobacco and alcohol abuse    Pulmonary embolism [facility])     Past Surgical History:  Procedure Laterality Date   BACK SURGERY     CARDIAC CATHETERIZATION N/A 09/05/2015   Procedure: Left Heart Cath and Coronary Angiography;  Surgeon: Minna Merritts, MD;  Location: [place] CV LAB;  Service: Cardiovascular;  Laterality: N/A;   DRAIN REMOVAL Right 12/20/2015   Procedure: DRAIN REMOVAL;  Surgeon: Nickie Retort, MD;  Location: [facility] ORS;  Service: Urology;  Laterality: Right;  by dr copper   LAPAROTOMY N/A 12/14/2015   Procedure: EXPLORATORY LAPAROTOMY, prepyloric gastric ulcer biopsy;  Surgeon: Jules Husbands, MD;  Location: [facility] ORS;  Service: General;  Laterality: N/A;   Patellar tendon repair Right    PERIPHERAL VASCULAR CATHETERIZATION N/A 09/07/2015   Procedure: IVC Filter Insertion;  Surgeon: Algernon Huxley,  MD;  Location: [place] CV LAB;  Service: Cardiovascular;  Laterality: N/A;   Testicular torsion repair     TRANSURETHRAL RESECTION OF BLADDER TUMOR N/A 12/20/2015   Procedure: TRANSURETHRAL RESECTION OF BLADDER TUMOR (TURBT);  Surgeon: Nickie Retort, MD;  Location: [facility] ORS;  Service: Urology;  Laterality: N/A;   TRANSURETHRAL RESECTION OF BLADDER TUMOR N/A 03/26/2016   Procedure: TRANSURETHRAL RESECTION OF BLADDER TUMOR (TURBT);  Surgeon: Nickie Retort, MD;  Location: [facility] ORS;  Service: Urology;  Laterality: N/A;     reports that he has  been smoking cigarettes. He has a 20.00 pack-year smoking history. He has never used smokeless tobacco. He reports current alcohol use of about 10.0 standard drinks of alcohol per week. He reports that he does not use drugs.  No Known Allergies  Family History  Problem Relation Age of Onset   Congestive Heart Failure Mother    Peripheral vascular disease Father      Prior to Admission medications   Medication Sig Start Date End Date Taking? Authorizing Provider  apixaban (ELIQUIS) 5 MG TABS tablet Take 1 tablet (5 mg) by mouth twice daily    Yes [provider]  aspirin EC 81 MG tablet Take 81 mg by mouth daily.   Yes [provider]  atorvastatin (LIPITOR) 40 MG tablet Take 1 tablet (40 mg total) by mouth daily. 07/22/19 01/01/21 Yes Agbor-Etang, Aaron Edelman, MD  carvedilol (COREG) 6.25 MG tablet Take 1 tablet (6.25 mg total) by mouth 2 (two) times daily. 12/26/20  Yes Agbor-Etang, Aaron Edelman, MD  cetirizine (ZYRTEC) 10 MG tablet Take 10 mg by mouth daily as needed for allergies.   Yes [provider]  hydrALAZINE (APRESOLINE) 50 MG tablet Take 1 tablet (50 mg total) by mouth 2 (two) times daily. 12/26/20  Yes Agbor-Etang, Aaron Edelman, MD  torsemide (DEMADEX) 20 MG tablet Take 2 tablets (40 mg) by mouth ONCE EVERY OTHER DAY 09/30/19  Yes Kate Sable, MD    Physical Exam: Vitals:   01/01/21 1413 01/01/21 1418 01/01/21 1706 01/01/21 2135  BP:  (!) 140/101 (!) 149/79 (!) 186/93  Pulse:  89 93 89  Resp:  17 16 17   Temp:  98.4 F (36.9 C)    TempSrc:  Oral    SpO2:  96% 98% 97%  Weight: 117.9 kg     Height: 6\' 2"  (1.88 m)       Constitutional: NAD, AAOx3 HEENT: conjunctivae and lids normal, EOMI CV: RRR with frequent prematures beats, No cyanosis.   RESP: normal respiratory effort, on RA Extremities: No effusions, edema in BLE SKIN: warm, dry Neuro: II - XII grossly intact.   Psych: Normal mood and affect.  Appropriate judgement and reason   Labs on Admission: I  have personally reviewed following labs and imaging studies  CBC: Recent Labs  Lab 01/01/21 1417  WBC 5.1  HGB 14.7  HCT 43.4  MCV 101.6*  PLT 169   Basic Metabolic Panel: Recent Labs  Lab 01/01/21 1417 01/01/21 1657  NA 142  --   K 3.8  --   CL 103  --   CO2 26  --   GLUCOSE 91  --   BUN 21  --   CREATININE 1.14  --   CALCIUM 8.0*  --   MG  --  1.4*   GFR: Estimated Creatinine Clearance: 90.5 mL/min (by C-G formula based on SCr of 1.14 mg/dL). Liver Function Tests: No results for input(s): AST, ALT, ALKPHOS, BILITOT, PROT, ALBUMIN  in the last 168 hours. No results for input(s): LIPASE, AMYLASE in the last 168 hours. No results for input(s): AMMONIA in the last 168 hours. Coagulation Profile: No results for input(s): INR, PROTIME in the last 168 hours. Cardiac Enzymes: No results for input(s): CKTOTAL, CKMB, CKMBINDEX, TROPONINI in the last 168 hours. BNP (last 3 results) No results for input(s): PROBNP in the last 8760 hours. HbA1C: No results for input(s): HGBA1C in the last 72 hours. CBG: No results for input(s): GLUCAP in the last 168 hours. Lipid Profile: No results for input(s): CHOL, HDL, LDLCALC, TRIG, CHOLHDL, LDLDIRECT in the last 72 hours. Thyroid Function Tests: Recent Labs    01/01/21 1657  TSH 1.431   Anemia Panel: No results for input(s): VITAMINB12, FOLATE, FERRITIN, TIBC, IRON, RETICCTPCT in the last 72 hours. Urine analysis:    Component Value Date/Time   COLORURINE YELLOW (A) 09/05/2015 1915   APPEARANCEUR Cloudy (A) 05/13/2016 1000   LABSPEC 1.031 (H) 09/05/2015 1915   PHURINE 9.0 (H) 09/05/2015 1915   GLUCOSEU Negative 05/13/2016 1000   HGBUR 3+ (A) 09/05/2015 1915   BILIRUBINUR Negative 05/13/2016 1000   KETONESUR NEGATIVE 09/05/2015 1915   PROTEINUR 2+ (A) 05/13/2016 1000   PROTEINUR 100 (A) 09/05/2015 1915   NITRITE Negative 05/13/2016 1000   NITRITE POSITIVE (A) 09/05/2015 1915   LEUKOCYTESUR 3+ (A) 05/13/2016 1000     Radiological Exams on Admission: DG Chest 2 View  Result Date: 01/01/2021 CLINICAL DATA:  Heart palpitations, history of CHF and coronary artery disease EXAM: CHEST - 2 VIEW COMPARISON:  07/12/2019 FINDINGS: Frontal and lateral views of the chest demonstrate an unremarkable cardiac silhouette. No acute airspace disease, effusion, or pneumothorax. No acute bony abnormalities. IMPRESSION: 1. No acute intrathoracic process. Electronically Signed   By: Randa Ngo M.D.   On: 01/01/2021 15:16      Enzo Bi MD [facility]  If 7PM-7AM, please contact night-coverage 01/01/2021, 9:39 PM

## 2021-01-01 NOTE — ED Notes (Signed)
Pt up to the bathroom. Pt steady on feet

## 2021-01-01 NOTE — ED Triage Notes (Signed)
Pt c/o "feeling my heart skip beats". Denies any pain or SOB. Pt is in NAD. 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

## 2021-01-01 NOTE — ED Notes (Addendum)
Pt states coming in for his heart skipping beats, but denies pain. Pts troponin came back as 105 as per ED charge who took the critical. Dr. Billie Ruddy notified.

## 2021-01-01 NOTE — ED Provider Notes (Signed)
[facility] Emergency Department Provider Note  ____________________________________________   Event Date/Time   First MD Initiated Contact with Patient 01/01/21 1619     (approximate)  I have reviewed the triage vital signs and the nursing notes.   HISTORY  Chief Complaint Palpitations   HPI Frederick Perry is a 65 y.o. male with past medical history of CHF, CAD, DM, HTN, tobacco and binge drinking alcohol abuse as well as a history of a PE currently on Eliquis who presents for assessment of some palpitations since yesterday and today.  He states he had a few minutes of palpitations last night and again this morning.  States he wanted to get checked out for this since he has a history of heart attack.  He denies any associated chest pain, shortness of breath, cough, fevers, nausea, vomiting, diarrhea, dysuria, rash or back pain.  States he is otherwise been taking his medicines.  Denies any recent falls or injuries.  States he currently does not feel any palpitations.  He took a baby ASA this morning but no other ASA.      Past Medical History:  Diagnosis Date   CHF (congestive heart failure) ([place])    Coronary artery disease    LHC 2017- occ OM2, occ PDA (both being filled via collaterals), moderate disease in LAD   Diabetes mellitus without complication ([place])    Not on medications   Hypertension    Not on medications.stopped since he ran out of medications   Myocardial infarction [facility])    Polysubstance abuse ([place])    a. ongoing tobacco and alcohol abuse    Pulmonary embolism [facility])     Patient Active Problem List   Diagnosis Date Noted   Diabetes mellitus without complication ([place]) 02/54/2706   Bladder cancer ([place]) 03/26/2016   Perforated bowel ([place]) 12/14/2015   Perforation bowel (H[facility])    Iron deficiency anemia due to chronic blood loss 09/07/2015   Weight loss    NSTEMI (non-ST elevated myocardial infarction) ([place]) 09/05/2015   Absolute  anemia    Ischemic chest pain ([place])    Uncontrolled type 2 diabetes mellitus with circulatory disorder ([place])    Hyperlipidemia     Past Surgical History:  Procedure Laterality Date   BACK SURGERY     CARDIAC CATHETERIZATION N/A 09/05/2015   Procedure: Left Heart Cath and Coronary Angiography;  Surgeon: Minna Merritts, MD;  Location: [place] CV LAB;  Service: Cardiovascular;  Laterality: N/A;   DRAIN REMOVAL Right 12/20/2015   Procedure: DRAIN REMOVAL;  Surgeon: Nickie Retort, MD;  Location: [facility] ORS;  Service: Urology;  Laterality: Right;  by dr copper   LAPAROTOMY N/A 12/14/2015   Procedure: EXPLORATORY LAPAROTOMY, prepyloric gastric ulcer biopsy;  Surgeon: Jules Husbands, MD;  Location: [facility] ORS;  Service: General;  Laterality: N/A;   Patellar tendon repair Right    PERIPHERAL VASCULAR CATHETERIZATION N/A 09/07/2015   Procedure: IVC Filter Insertion;  Surgeon: Algernon Huxley, MD;  Location: [place] [facility];  Service: Cardiovascular;  Laterality: N/A;   Testicular torsion repair     TRANSURETHRAL RESECTION OF BLADDER TUMOR N/A 12/20/2015   Procedure: TRANSURETHRAL RESECTION OF BLADDER TUMOR (TURBT);  Surgeon: Nickie Retort, MD;  Location: [facility] ORS;  Service: Urology;  Laterality: N/A;   TRANSURETHRAL RESECTION OF BLADDER TUMOR N/A 03/26/2016   Procedure: TRANSURETHRAL RESECTION OF BLADDER TUMOR (TURBT);  Surgeon: Nickie Retort, MD;  Location: [facility] OR[facility];  Service: Urology;  Laterality: N/A;  Prior to Admission medications   Medication Sig Start Date End Date Taking? Authorizing Provider  apixaban (ELIQUIS) 5 MG TABS tablet Take 1 tablet (5 mg) by mouth twice daily     [provider]  aspirin EC 81 MG tablet Take 81 mg by mouth daily.    [provider]  atorvastatin (LIPITOR) 40 MG tablet Take 1 tablet (40 mg total) by mouth daily. 07/22/19 10/20/19  Kate Sable, MD  carvedilol (COREG) 6.25 MG tablet Take 1 tablet (6.25 mg total) by mouth 2 (two)  times daily. 12/26/20   Kate Sable, MD  hydrALAZINE (APRESOLINE) 50 MG tablet Take 1 tablet (50 mg total) by mouth 2 (two) times daily. 12/26/20   Kate Sable, MD  torsemide (DEMADEX) 20 MG tablet Take 2 tablets (40 mg) by mouth ONCE EVERY OTHER DAY 09/30/19   Kate Sable, MD    Allergies Patient has no known allergies.  Family History  Problem Relation Age of Onset   Congestive Heart Failure Mother    Peripheral vascular disease Father     Social History Social History   Tobacco Use   Smoking status: Every Day    Packs/day: 0.50    Years: 40.00    Pack years: 20.00    Types: Cigarettes   Smokeless tobacco: Never  Vaping Use   Vaping Use: Never used  Substance Use Topics   Alcohol use: Yes    Alcohol/week: 10.0 standard drinks    Types: 10 Cans of beer per week    Comment: weekly- beers and occasionally liquor   Drug use: No    Review of Systems  Review of Systems  Constitutional:  Negative for chills and fever.  HENT:  Negative for sore throat.   Eyes:  Negative for pain.  Respiratory:  Negative for cough and stridor.   Cardiovascular:  Positive for palpitations. Negative for chest pain.  Gastrointestinal:  Negative for vomiting.  Genitourinary:  Negative for dysuria.  Musculoskeletal:  Negative for myalgias.  Skin:  Negative for rash.  Neurological:  Negative for seizures, loss of consciousness and headaches.  Psychiatric/Behavioral:  Negative for suicidal ideas.   All other systems reviewed and are negative.    ____________________________________________   PHYSICAL EXAM:  VITAL SIGNS: ED Triage Vitals  Enc Vitals Group     BP 01/01/21 1418 (!) 140/101     Pulse Rate 01/01/21 1418 89     Resp 01/01/21 1418 17     Temp 01/01/21 1418 98.4 F (36.9 C)     Temp Source 01/01/21 1418 Oral     SpO2 01/01/21 1418 96 %     Weight 01/01/21 1413 260 lb (117.9 kg)     Height 01/01/21 1413 6\' 2"  (1.88 m)     Head Circumference --      Peak  Flow --      Pain Score 01/01/21 1411 0     Pain Loc --      Pain Edu? --      Excl. in [place]? --    Vitals:   01/01/21 1418  BP: (!) 140/101  Pulse: 89  Resp: 17  Temp: 98.4 F (36.9 C)  SpO2: 96%   Physical Exam Vitals and nursing note reviewed.  Constitutional:      Appearance: He is well-developed. He is obese.  HENT:     Head: Normocephalic and atraumatic.     Right Ear: External ear normal.     Left Ear: External ear normal.  Nose: Nose normal.     Mouth/Throat:     Mouth: Mucous membranes are dry.  Eyes:     Conjunctiva/sclera: Conjunctivae normal.  Cardiovascular:     Rate and Rhythm: Normal rate and regular rhythm.     Heart sounds: No murmur heard. Pulmonary:     Effort: Pulmonary effort is normal. No respiratory distress.     Breath sounds: Normal breath sounds.  Abdominal:     Palpations: Abdomen is soft.     Tenderness: There is no abdominal tenderness.  Musculoskeletal:     Cervical back: Neck supple.  Skin:    General: Skin is warm and dry.     Capillary Refill: Capillary refill takes less than 2 seconds.  Neurological:     Mental Status: He is alert and oriented to person, place, and time.  Psychiatric:        Mood and Affect: Mood normal.     ____________________________________________   LABS (all labs ordered are listed, but only abnormal results are displayed)  Labs Reviewed  BASIC METABOLIC PANEL - Abnormal; Notable for the following components:      Result Value   Calcium 8.0 (*)    All other components within normal limits  CBC - Abnormal; Notable for the following components:   MCV 101.6 (*)    MCH 34.4 (*)    All other components within normal limits  TROPONIN I (HIGH SENSITIVITY) - Abnormal; Notable for the following components:   Troponin I (High Sensitivity) 99 (*)    All other components within normal limits  RESP PANEL BY RT-PCR (FLU A&B, COVID) ARPGX2  MAGNESIUM  TSH  TROPONIN I (HIGH SENSITIVITY)    ____________________________________________  EKG  Sinus rhythm with a premature atrial complex, ventricular rate of 93, some subtle ST changes in inferior and lateral leads without other appearance of acute ischemia. ____________________________________________  RADIOLOGY  ED MD interpretation: Chest x-ray has no focal consolidation, large effusion, significant IMA, pneumothorax or other clear acute intrathoracic process.  Official radiology report(s): DG Chest 2 View  Result Date: 01/01/2021 CLINICAL DATA:  Heart palpitations, history of CHF and coronary artery disease EXAM: CHEST - 2 VIEW COMPARISON:  07/12/2019 FINDINGS: Frontal and lateral views of the chest demonstrate an unremarkable cardiac silhouette. No acute airspace disease, effusion, or pneumothorax. No acute bony abnormalities. IMPRESSION: 1. No acute intrathoracic process. Electronically Signed   By: Randa Ngo M.D.   On: 01/01/2021 15:16    ____________________________________________   PROCEDURES  Procedure(s) performed (including Critical Care):  .Critical Care  Date/Time: 01/01/2021 4:40 PM Performed by: Lucrezia Starch, MD Authorized by: Lucrezia Starch, MD   Critical care provider statement:    Critical care time (minutes):  45   Critical care was necessary to treat or prevent imminent or life-threatening deterioration of the following conditions:  Cardiac failure   Critical care was time spent personally by me on the following activities:  Discussions with consultants, evaluation of patient's response to treatment, examination of patient, ordering and performing treatments and interventions, ordering and review of laboratory studies, ordering and review of radiographic studies, pulse oximetry, re-evaluation of patient's condition, obtaining history from patient or surrogate and review of old charts   ____________________________________________   INITIAL IMPRESSION / [place] / ED  COURSE      Patient presents for assessment of some palpitations he experienced last night and again this morning both lasting only couple minutes and not associate with any chest pain.  On  arrival he is afebrile and hemodynamically stable.  Primary differential includes ACS, metabolic derangements, symptomatic arrhythmia, anemia, PE, pneumonia possible hyperthyroidism  Overall Evalose patient for PE as patient states he is compliant with his Eliquis.  Chest x-ray has no evidence of pneumonia and patient denies any cough and he has no leukocytosis or fever.  CBC also has no evidence of acute anemia.  BMP has no significant electrode or metabolic derangements.  ECG and initial troponin of 99 are concerning for an NSTEMI.  Lower suspicion for dissection or other immediate life-threatening process.  We will give patient 3 tablets of ASA to bring him to 324 mg today.  Will defer heparin given he has no chest pain and is on Eliquis.  I will admit to medicine service for further evaluation and management.      ____________________________________________   FINAL CLINICAL IMPRESSION(S) / ED DIAGNOSES  Final diagnoses:  Palpitations  NSTEMI (non-ST elevated myocardial infarction) ([place])    Medications  aspirin chewable tablet 243 mg (has no administration in time range)     ED Discharge Orders     None        Note:  This document was prepared using Dragon voice recognition software and may include unintentional dictation errors.    Lucrezia Starch, MD 01/01/21 1640

## 2021-01-01 NOTE — ED Notes (Signed)
Dr. Billie Ruddy notified that pts troponin resulted at 119

## 2021-01-02 ENCOUNTER — Other Ambulatory Visit: Payer: Self-pay

## 2021-01-02 DIAGNOSIS — R002 Palpitations: Secondary | ICD-10-CM | POA: Diagnosis not present

## 2021-01-02 LAB — BASIC METABOLIC PANEL
Anion gap: 10 (ref 5–15)
BUN: 17 mg/dL (ref 8–23)
CO2: 30 mmol/L (ref 22–32)
Calcium: 8.5 mg/dL — ABNORMAL LOW (ref 8.9–10.3)
Chloride: 102 mmol/L (ref 98–111)
Creatinine, Ser: 0.96 mg/dL (ref 0.61–1.24)
GFR, Estimated: >60 mL/min (ref >60)
Glucose, Bld: 112 mg/dL — ABNORMAL HIGH (ref 70–99)
Potassium: 3.5 mmol/L (ref 3.5–5.1)
Sodium: 142 mmol/L (ref 135–145)

## 2021-01-02 LAB — CBC
HCT: 43.2 % (ref 39.0–52.0)
Hemoglobin: 15 g/dL (ref 13.0–17.0)
MCH: 34.2 pg — ABNORMAL HIGH (ref 26.0–34.0)
MCHC: 34.7 g/dL (ref 30.0–36.0)
MCV: 98.6 fL (ref 80.0–100.0)
Platelets: 145 10*3/uL — ABNORMAL LOW (ref 150–400)
RBC: 4.38 MIL/uL (ref 4.22–5.81)
RDW: 15.2 % (ref 11.5–15.5)
WBC: 5 10*3/uL (ref 4.0–10.5)
nRBC: 0.4 % — ABNORMAL HIGH (ref 0.0–0.2)

## 2021-01-02 LAB — MAGNESIUM: Magnesium: 2.2 mg/dL (ref 1.7–2.4)

## 2021-01-02 MED ORDER — TORSEMIDE 20 MG PO TABS
ORAL_TABLET | ORAL | Status: DC
Start: 2021-01-02 — End: 2021-01-04

## 2021-01-02 MED ORDER — CARVEDILOL 6.25 MG PO TABS: 6.2500 mg | ORAL_TABLET | Freq: Two times a day (BID) | ORAL | 0 refills | Status: DC

## 2021-01-02 MED ORDER — HYDRALAZINE HCL 50 MG PO TABS: 50.0000 mg | ORAL_TABLET | Freq: Three times a day (TID) | ORAL | 0 refills | Status: DC

## 2021-01-02 MED ORDER — TORSEMIDE 20 MG PO TABS: ORAL_TABLET | ORAL | Status: DC

## 2021-01-02 MED ORDER — MAGNESIUM OXIDE -MG SUPPLEMENT 200 MG PO TABS: 1.0000 | ORAL_TABLET | Freq: Two times a day (BID) | ORAL | 0 refills | Status: DC

## 2021-01-02 NOTE — Discharge Summary (Signed)
Physician Discharge Summary   Frederick Perry  male DOB: 08/14/1955  FOY:[id number]  PCP: Gladstone Lighter, MD  Admit date: 01/01/2021 Discharge date: 01/02/2021  Admitted From: home Disposition:  home CODE STATUS: Full code  Discharge Instructions     Discharge instructions   Complete by: As directed    Your feeling of heart skipping beats is due to premature atrial contractions.  I have discussed with your cardiologist Dr. Garen Lah, and he is ok with you going home and follow up with him in his clinic.   Dr. Enzo Bi [facility] Course:  For full details, please see H&P, progress notes, consult notes and ancillary notes.  Briefly,  Frederick Perry is a 65 y.o. male with medical history significant of hypertension, diabetes, heart failure reduced ejection fraction, last EF 30 to 35%, CAD, smoker, DVT, PE status post IVC filter placement in 2017 currently on Eliquis who presented with feeling of his heart skipping beats.  No chest pain.  In the ED, labs notable for initial trop 99.  EKG showed sinus with PAC's.  2nd trop returned 119.  ED provider requested pt to be admitted for overnight observation.    # Heart palpitation 2/2 PAC's --frequent, captured on EKG and also upon auscultation.   --overnight tele monitoring showed only PAC's and PVC's. --Pt will follow up with his outpatient cardiologist Dr. Garen Lah.   # Troponin elevation, ACS ruled out --99 and 119, flat, no chest pain, unlikely to be ACS.   --Discussed with pt's outpatient cardiologist Dr. Garen Lah who agreed with discharge and close outpatient followup with him in clinic.  No other inpatient workup needed.   # Chronic combined CHF --last EF 30 to 35%.  Not in exacerbation. --pt said he takes torsemide PRN, but not recently. 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   --BP still elevated with coreg and hydralazine given during hospitalization. --coreg and hydralazine refilled for pt at discharge.   # Hypomag --repleted with IV mag --start oral max supplement after discharge.    Discharge Diagnoses:  Active Problems:   Palpitation     Discharge Instructions:  Allergies as of 01/02/2021   No Known Allergies      Medication List     TAKE these medications    apixaban 5 MG Tabs tablet Commonly known as: ELIQUIS Take 1 tablet (5 mg) by mouth twice daily   aspirin EC 81 MG tablet Take 81 mg by mouth daily.   atorvastatin 40 MG tablet Commonly known as: LIPITOR Take 1 tablet (40 mg total) by mouth daily.   carvedilol 6.25 MG tablet Commonly known as: COREG Take 1 tablet (6.25 mg total) by mouth 2 (two) times daily.   cetirizine 10 MG tablet Commonly known as: ZYRTEC Take 10 mg by mouth daily as needed for allergies.   hydrALAZINE 50 MG tablet Commonly known as: APRESOLINE Take 1 tablet (50 mg total) by mouth 3 (three) times daily. What changed: when to take this   Magnesium Oxide 200 MG Tabs Commonly known as: Mag-Oxide Take 1 tablet (200 mg total) by mouth 2 (two) times daily. Can take any form of over-the-counter   torsemide  20 MG tablet Commonly known as: DEMADEX Patient takes as needed. What changed: additional instructions         Follow-up Information     Agbor-Etang, Aaron Edelman, MD Follow up in 1 week(s).   Specialties: Cardiology, Radiology Contact information: [place] [address] [phone]                 No Known Allergies   The results of significant diagnostics from this hospitalization (including imaging, microbiology, ancillary and laboratory) are listed below for reference.   Consultations:   Procedures/Studies: DG Chest 2 View  Result Date:  01/01/2021 CLINICAL DATA:  Heart palpitations, history of CHF and coronary artery disease EXAM: CHEST - 2 VIEW COMPARISON:  07/12/2019 FINDINGS: Frontal and lateral views of the chest demonstrate an unremarkable cardiac silhouette. No acute airspace disease, effusion, or pneumothorax. No acute bony abnormalities. IMPRESSION: 1. No acute intrathoracic process. Electronically Signed   By: Randa Ngo M.D.   On: 01/01/2021 15:16      Labs: BNP (last 3 results) No results for input(s): BNP in the last 8760 hours. Basic Metabolic Panel: Recent Labs  Lab 01/01/21 1417 01/01/21 1657 01/02/21 0644  NA 142  --  142  K 3.8  --  3.5  CL 103  --  102  CO2 26  --  30  GLUCOSE 91  --  112*  BUN 21  --  17  CREATININE 1.14  --  0.96  CALCIUM 8.0*  --  8.5*  MG  --  1.4* 2.2   Liver Function Tests: No results for input(s): AST, ALT, ALKPHOS, BILITOT, PROT, ALBUMIN in the last 168 hours. No results for input(s): LIPASE, AMYLASE in the last 168 hours. No results for input(s): AMMONIA in the last 168 hours. CBC: Recent Labs  Lab 01/01/21 1417 01/02/21 0644  WBC 5.1 5.0  HGB 14.7 15.0  HCT 43.4 43.2  MCV 101.6* 98.6  PLT 152 145*   Cardiac Enzymes: No results for input(s): CKTOTAL, CKMB, CKMBINDEX, TROPONINI in the last 168 hours. BNP: Invalid input(s): POCBNP CBG: No results for input(s): GLUCAP in the last 168 hours. D-Dimer No results for input(s): DDIMER in the last 72 hours. Hgb A1c No results for input(s): HGBA1C in the last 72 hours. Lipid Profile No results for input(s): CHOL, HDL, LDLCALC, TRIG, CHOLHDL, LDLDIRECT in the last 72 hours. Thyroid function studies Recent Labs    01/01/21 1657  TSH 1.431   Anemia work up No results for input(s): VITAMINB12, FOLATE, FERRITIN, TIBC, IRON, RETICCTPCT in the last 72 hours. Urinalysis    Component Value Date/Time   COLORURINE YELLOW (A) 09/05/2015 1915   APPEARANCEUR Cloudy (A) 05/13/2016 1000   LABSPEC 1.031 (H)  09/05/2015 1915   PHURINE 9.0 (H) 09/05/2015 1915   GLUCOSEU Negative 05/13/2016 1000   HGBUR 3+ (A) 09/05/2015 1915   BILIRUBINUR Negative 05/13/2016 1000   KETONESUR NEGATIVE 09/05/2015 1915   PROTEINUR 2+ (A) 05/13/2016 1000   PROTEINUR 100 (A) 09/05/2015 1915   NITRITE Negative 05/13/2016 1000   NITRITE POSITIVE (A) 09/05/2015 1915   LEUKOCYTESUR 3+ (A) 05/13/2016 1000   Sepsis Labs Invalid input(s): PROCALCITONIN,  WBC,  LACTICIDVEN Microbiology Recent Results (from the past 240 hour(s))  Resp Panel by RT-PCR (Flu A&B, Covid) Nasopharyngeal Swab     Status: None   Collection Time: 01/01/21  4:57 PM   Specimen: Nasopharyngeal Swab; Nasopharyngeal(NP) swabs in vial transport medium  Result Value Ref Range Status   SARS  Coronavirus 2 by RT PCR NEGATIVE NEGATIVE Final    Comment: (NOTE) SARS-CoV-2 target nucleic acids are NOT DETECTED.  The SARS-CoV-2 RNA is generally detectable in upper respiratory specimens during the acute phase of infection. The lowest concentration of SARS-CoV-2 viral copies this assay can detect is 138 copies/mL. A negative result does not preclude SARS-Cov-2 infection and should not be used as the sole basis for treatment or other patient management decisions. A negative result may occur with  improper specimen collection/handling, submission of specimen other than nasopharyngeal swab, presence of viral mutation(s) within the areas targeted by this assay, and inadequate number of viral copies(<138 copies/mL). A negative result must be combined with clinical observations, patient history, and epidemiological information. The expected result is Negative.  Fact Sheet for Patients:  EntrepreneurPulse.com.au  Fact Sheet for Healthcare Providers:  IncredibleEmployment.be  This test is no t yet approved or cleared by the [place] FDA and  has been authorized for detection and/or diagnosis of SARS-CoV-2 by FDA under  an Emergency Use Authorization (EUA). This EUA will remain  in effect (meaning this test can be used) for the duration of the COVID-19 declaration under Section 564(b)(1) of the Act, 21 U.S.C.section 360bbb-3(b)(1), unless the authorization is terminated  or revoked sooner.       Influenza A by PCR NEGATIVE NEGATIVE Final   Influenza B by PCR NEGATIVE NEGATIVE Final    Comment: (NOTE) The Xpert Xpress SARS-CoV-2/FLU/RSV plus assay is intended as an aid in the diagnosis of influenza from Nasopharyngeal swab specimens and should not be used as a sole basis for treatment. Nasal washings and aspirates are unacceptable for Xpert Xpress SARS-CoV-2/FLU/RSV testing.  Fact Sheet for Patients: EntrepreneurPulse.com.au  Fact Sheet for Healthcare Providers: IncredibleEmployment.be  This test is not yet approved or cleared by the [place] FDA and has been authorized for detection and/or diagnosis of SARS-CoV-2 by FDA under an Emergency Use Authorization (EUA). This EUA will remain in effect (meaning this test can be used) for the duration of the COVID-19 declaration under Section 564(b)(1) of the Act, 21 U.S.C. section 360bbb-3(b)(1), unless the authorization is terminated or revoked.  Performed at [facility], [address] [place], [place] [address]      Total time spend on discharging this patient, including the last patient exam, discussing the hospital stay, instructions for ongoing care as it relates to all pertinent caregivers, as well as preparing the medical discharge records, prescriptions, and/or referrals as applicable, is 50 minutes.    Enzo Bi, MD  [facility] 01/02/2021, 9:45 AM

## 2021-01-02 NOTE — ED Notes (Signed)
[facility] called with patients follow up appointment. He will be seen at 0800 on 01/03/21 by Dr. Garen Lah

## 2021-01-02 NOTE — ED Notes (Addendum)
Lab at the bedside 

## 2021-01-03 ENCOUNTER — Ambulatory Visit: Payer: BC Managed Care – PPO | Admitting: Cardiology

## 2021-01-04 ENCOUNTER — Other Ambulatory Visit: Payer: Self-pay

## 2021-01-04 ENCOUNTER — Encounter: Payer: Self-pay | Admitting: Cardiology

## 2021-01-04 ENCOUNTER — Other Ambulatory Visit
Admission: RE | Admit: 2021-01-04 | Discharge: 2021-01-04 | Disposition: A | Discharge status: Home / Self Care | Payer: BC Managed Care – PPO | Source: Ambulatory Visit | Attending: Cardiology | Admitting: Cardiology

## 2021-01-04 ENCOUNTER — Ambulatory Visit (INDEPENDENT_AMBULATORY_CARE_PROVIDER_SITE_OTHER): Payer: BC Managed Care – PPO | Admitting: Cardiology

## 2021-01-04 VITALS — BP 108/70 | HR 77 | Ht 74.0 in | Wt 252.0 lb

## 2021-01-04 DIAGNOSIS — Z86718 Personal history of other venous thrombosis and embolism: Secondary | ICD-10-CM

## 2021-01-04 DIAGNOSIS — I5020 Unspecified systolic (congestive) heart failure: Secondary | ICD-10-CM

## 2021-01-04 DIAGNOSIS — I2510 Atherosclerotic heart disease of native coronary artery without angina pectoris: Secondary | ICD-10-CM

## 2021-01-04 LAB — BASIC METABOLIC PANEL
Anion gap: 10 (ref 5–15)
BUN: 10 mg/dL (ref 8–23)
CO2: 25 mmol/L (ref 22–32)
Calcium: 8.7 mg/dL — ABNORMAL LOW (ref 8.9–10.3)
Chloride: 102 mmol/L (ref 98–111)
Creatinine, Ser: 1.02 mg/dL (ref 0.61–1.24)
GFR, Estimated: >60 mL/min (ref >60)
Glucose, Bld: 128 mg/dL — ABNORMAL HIGH (ref 70–99)
Potassium: 4 mmol/L (ref 3.5–5.1)
Sodium: 137 mmol/L (ref 135–145)

## 2021-01-04 LAB — CBC
HCT: 43.3 % (ref 39.0–52.0)
Hemoglobin: 14.8 g/dL (ref 13.0–17.0)
MCH: 34.6 pg — ABNORMAL HIGH (ref 26.0–34.0)
MCHC: 34.2 g/dL (ref 30.0–36.0)
MCV: 101.2 fL — ABNORMAL HIGH (ref 80.0–100.0)
Platelets: 178 10*3/uL (ref 150–400)
RBC: 4.28 MIL/uL (ref 4.22–5.81)
RDW: 14.9 % (ref 11.5–15.5)
WBC: 5.4 10*3/uL (ref 4.0–10.5)
nRBC: 0 % (ref 0.0–0.2)

## 2021-01-04 NOTE — Progress Notes (Signed)
Cardiology Office Note:    Date:  01/04/2021   ID:  Frederick Perry, DOB [birth year], MRN [id number]  PCP:  Gladstone Lighter, MD  Cardiologist:  Kate Sable, MD  Electrophysiologist:  None   Referring MD: Gladstone Lighter, MD   Chief Complaint  Patient presents with   Other    ED follow up - Meds reviewed verbally with patient.     History of Present Illness:    Frederick Perry is a 65 y.o. male with a hx of CAD, HFreF EF 30-35%, hypertension, diabetes, smoker, DVT, PE status post IVC filter placement in 2017 who presents for follow-up.    Patient recently seen in the ED due to skipped heartbeats. EKG showed PACs.  Mild troponin elevation of 131. He stopped taking Coreg 2 days before.  Symptoms of skipped heartbeats resolved after starting Coreg.  Denies chest pain.  Was previously seen with worsening ejection fraction, left heart cath was planned, patient did not schedule heart cath and forgot.  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  His work does not involve strenuous activity.   Prior notes Echo 08/2019 EF 30 to 35% Echo 2017, EF 50 to 55%  Patient was seen in the [facility] back in 08/2015 for chest pain.  Echocardiogram at the time showed EF of 50 to [id number], diastolic function was normal.  Left heart cath showed occluded OM 2 vessel and occluded PDA both being filled via collaterals.  Moderate disease noted in the mid LAD.  Medical management was recommended.  He was subsequently diagnosed with DVT and PE on that admission in 08/2015, had an IVC filter placed since patient was anemic with hemoglobin of 4.9 at the time.  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   Past Medical History:  Diagnosis Date   CHF (congestive heart failure) ([place])    Coronary artery disease    LHC 2017- occ OM2, occ PDA (both being filled via collaterals), moderate disease in LAD   Diabetes mellitus  without complication ([place])    Not on medications   Hypertension    Not on medications.stopped since he ran out of medications   Myocardial infarction [facility])    Polysubstance abuse ([place])    a. ongoing tobacco and alcohol abuse    Pulmonary embolism [facility])     Past Surgical History:  Procedure Laterality Date   BACK SURGERY     CARDIAC CATHETERIZATION N/A 09/05/2015   Procedure: Left Heart Cath and Coronary Angiography;  Surgeon: Minna Merritts, MD;  Location: [facility];  Service: Cardiovascular;  Laterality: N/A;   DRAIN REMOVAL Right 12/20/2015   Procedure: DRAIN REMOVAL;  Surgeon: Nickie Retort, MD;  Location: [facility] ORS;  Service: Urology;  Laterality: Right;  by dr copper   LAPAROTOMY N/A 12/14/2015   Procedure: EXPLORATORY LAPAROTOMY, prepyloric gastric ulcer biopsy;  Surgeon: Jules Husbands, MD;  Location: [facility] ORS;  Service: General;  Laterality: N/A;   Patellar tendon repair Right    PERIPHERAL VASCULAR CATHETERIZATION N/A 09/07/2015   Procedure: IVC Filter Insertion;  Surgeon: Algernon Huxley, MD;  Location: [facility];  Service: Cardiovascular;  Laterality: N/A;   Testicular torsion repair     TRANSURETHRAL RESECTION OF BLADDER TUMOR N/A 12/20/2015   Procedure: TRANSURETHRAL RESECTION OF BLADDER TUMOR (TURBT);  Surgeon: Nickie Retort, MD;  Location: [facility] ORS;  Service: Urology;  Laterality: N/A;  TRANSURETHRAL RESECTION OF BLADDER TUMOR N/A 03/26/2016   Procedure: TRANSURETHRAL RESECTION OF BLADDER TUMOR (TURBT);  Surgeon: Nickie Retort, MD;  Location: [facility] ORS;  Service: Urology;  Laterality: N/A;    Current Medications: Current Meds  Medication Sig   apixaban (ELIQUIS) 5 MG TABS tablet Take 1 tablet (5 mg) by mouth twice daily    aspirin EC 81 MG tablet Take 81 mg by mouth daily.   atorvastatin (LIPITOR) 40 MG tablet Take 1 tablet (40 mg total) by mouth daily.   carvedilol (COREG) 6.25 MG tablet Take 1 tablet (6.25 mg total) by mouth 2 (two) times  daily.   cetirizine (ZYRTEC) 10 MG tablet Take 10 mg by mouth daily as needed for allergies.   hydrALAZINE (APRESOLINE) 50 MG tablet Take 1 tablet (50 mg total) by mouth 3 (three) times daily.   torsemide (DEMADEX) 20 MG tablet Take 20 mg by mouth every other day.     Allergies:   Patient has no known allergies.   Social History   Socioeconomic History   Marital status: Divorced    Spouse name: Not on file   Number of children: Not on file   Years of education: Not on file   Highest education level: Not on file  Occupational History   Not on file  Tobacco Use   Smoking status: Every Day    Packs/day: 0.50    Years: 40.00    Pack years: 20.00    Types: Cigarettes   Smokeless tobacco: Never  Vaping Use   Vaping Use: Never used  Substance and Sexual Activity   Alcohol use: Yes    Alcohol/week: 10.0 standard drinks    Types: 10 Cans of beer per week    Comment: weekly- beers and occasionally liquor   Drug use: No   Sexual activity: Not on file  Other Topics Concern   Not on file  Social History Narrative   Lives at home with Mother and is his Mothers primary caregiver   Social Determinants of Health   Financial Resource Strain: Not on file  Food Insecurity: Not on file  Transportation Needs: Not on file  Physical Activity: Not on file  Stress: Not on file  Social Connections: Not on file     Family History: The patient's family history includes Congestive Heart Failure in his mother; Peripheral vascular disease in his father.  ROS:   Please see the history of present illness.     All other systems reviewed and are negative.  EKGs/Labs/Other Studies Reviewed:    The following studies were reviewed today: TTE 27-Sep-2015 Left ventricle: The cavity size was normal. There was mild focal    basal hypertrophy of the septum. Systolic function was normal.    The estimated ejection fraction was in the range of 50% to 55%.    Hypokinesis of the lateral myocardium.  Hypokinesis of the    inferior myocardium. Hypokinesis of the inferolateral myocardium.    Left ventricular diastolic function parameters were normal.  - Mitral valve: There was mild to moderate regurgitation.  - Left atrium: The atrium was mildly dilated.  - Right ventricle: Systolic function was normal.  - Pulmonary arteries: Systolic pressure was mildly elevated. PA    peak pressure: 42 mm Hg (S).   LHC 2015/09/27 Final Conclusions:    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  Arida. Given profound anemia, patient currently not having chest pain, event likely happening more than 2 days ago (that is when chest pain symptoms presented), troponins already peaked, also OM 2 and you relatively small to moderate size vessel, recommended medical management  EKG:  EKG is  ordered today.  The ekg ordered today demonstrates sinus rhythm, pac's  Recent Labs: 01/01/2021: TSH 1.431 01/02/2021: BUN 17; Creatinine, Ser 0.96; Hemoglobin 15.0; Magnesium 2.2; Platelets 145; Potassium 3.5; Sodium 142  Recent Lipid Panel    Component Value Date/Time   CHOL 189 07/21/2019 0928   CHOL 179 02/04/2018 1944   TRIG 82 07/21/2019 0928   HDL 55 07/21/2019 0928   HDL 48 02/04/2018 1944   CHOLHDL 3.4 07/21/2019 0928   VLDL 16 07/21/2019 0928   LDLCALC 118 (H) 07/21/2019 0928   LDLCALC 112 (H) 02/04/2018 1944    Physical Exam:    VS:  BP 108/70 (BP Location: Left Arm, Patient Position: Sitting, Cuff Size: Normal)   Pulse 77   Ht '6\' 2"'$  (1.88 m)   Wt 252 lb (114.3 kg)   SpO2 97%   BMI 32.35 kg/m     Wt Readings from Last 3 Encounters:  01/04/21 252 lb (114.3 kg)  01/01/21 260 lb (117.9 kg)  09/30/19 253 lb 6 oz (114.9 kg)     GEN:  Well nourished, well developed in no acute distress HEENT: Normal NECK: No JVD; No carotid bruits LYMPHATICS: No lymphadenopathy CARDIAC: RRR, no murmurs,  rubs, gallops RESPIRATORY:  Clear to auscultation anteriorly, decreased at bases. ABDOMEN: Soft, non-tender, distended MUSCULOSKELETAL: Trace edema to thighs; No deformity  SKIN: Warm and dry NEUROLOGIC:  Alert and oriented x 3 PSYCHIATRIC:  Normal affect   ASSESSMENT:    1. HFrEF (heart failure with reduced ejection fraction) ([place])   2. Coronary artery disease involving native coronary artery of native heart without angina pectoris   3. History of DVT of lower extremity     PLAN:    In order of problems listed above:  CHF, EF 30 to 35%, likely ischemic.  Trace dependent edema.  Describes NYHA class II symptoms.  Continue Coreg, hydralazine.  Plan left heart cath to evaluate CAD. History of CAD with occlusion of OM2 and PDA.  Continue aspirin 81 mg, Lipitor 40 mg daily. Repeat left heart cath as above history of DVT.  Continue Eliquis 5 mg twice daily. Patient advised to hold Eliquis 48 hours prior to left heart cath.   Follow-up after left heart cath   Shared Decision Making/Informed Consent The risks [stroke (1 in 1000), death (1 in 1000), kidney failure [usually temporary] (1 in 500), bleeding (1 in 200), allergic reaction [possibly serious] (1 in 200)], benefits (diagnostic support and management of coronary artery disease) and alternatives of a cardiac catheterization were discussed in detail with Frederick Perry and he is willing to proceed.    This note was generated in part or whole with voice recognition software. Voice recognition is usually quite accurate but there are transcription errors that can and very often do occur. I apologize for any typographical errors that were not detected and corrected.  Medication Adjustments/Labs and Tests Ordered: Current medicines are reviewed at length with the patient today.  Concerns regarding medicines are outlined above.  Orders Placed This Encounter  Procedures   CBC   Basic metabolic panel   EKG [id number]    No orders of the  defined types were placed in this encounter.   Patient Instructions  Medication Instructions:   Your physician recommends that you continue on your current medications as directed. Please refer to the Current Medication list given to you today.  *If you need a refill on your cardiac medications before your next appointment, please call your pharmacy*   Lab Work:  Please go to the medical mall for labs right after your appointment.     Testing/Procedures:   Frederick Perry  01/04/2021  You are scheduled for a Cardiac Catheterization on Monday, July 27 with Dr. Harrell Gave End.  1. Please arrive at the at the [facility] at  8:30 AM (This time is one hour before your procedure to ensure your preparation). Free valet parking service is available.   Special note: Every effort is made to have your procedure done on time. Please understand that emergencies sometimes delay scheduled procedures.  2. Diet: Do not eat solid foods after midnight.  The patient may have clear liquids until 5am upon the day of the procedure.  3. Labs: You will need to have blood drawn at the medical mall after office visit.  4. Medication instructions in preparation for your procedure:   Contrast Allergy: No   Stop taking Eliquis (Apixiban) on Saturday, July 23.  Stop taking, Torsemide (Demadex) Monday, July 25,    On the morning of your procedure, take your Aspirin and any morning medicines NOT listed above.  You may use sips of water.  5. Plan for one night stay--bring personal belongings. 6. Bring a current list of your medications and current insurance cards. 7. You MUST have a responsible person to drive you home. 8. Someone MUST be with you the first 24 hours after you arrive home or your discharge will be delayed. 9. Please wear clothes that are easy to get on and off and wear slip-on shoes.  Thank you for allowing [place] to care for you!   -- [facility]     Follow-Up: At [facility], you and your health needs are our priority.  As part of our continuing mission to provide you with exceptional heart care, we have created designated Provider Care Teams.  These Care Teams include your primary Cardiologist (physician) and Advanced Practice Providers (APPs -  Physician Assistants and Nurse Practitioners) who all work together to provide you with the care you need, when you need it.  We recommend signing up for the patient portal called "MyChart".  Sign up information is provided on this After Visit Summary.  MyChart is used to connect with patients for Virtual Visits (Telemedicine).  Patients are able to view lab/test results, encounter notes, upcoming appointments, etc.  Non-urgent messages can be sent to your provider as well.   To learn more about what you can do with MyChart, go to [email or web address].    Your next appointment:   1 month(s)  The format for your next appointment:   In Person  Provider:   You may see Kate Sable, MD or one of the following Advanced Practice Providers on your designated Care Team:   Murray Hodgkins, NP Christell Faith, PA-C Marrianne Mood, PA-C Cadence Tifton, [place]   Other Instructions    Signed, Kate Sable, MD  01/04/2021 11:13 AM    [place]

## 2021-01-04 NOTE — H&P (View-Only) (Signed)
Cardiology Office Note:    Date:  01/04/2021   ID:  RUSHABH SAADEH, DOB [birth year], MRN [id number]  PCP:  Gladstone Lighter, MD  Cardiologist:  Kate Sable, MD  Electrophysiologist:  None   Referring MD: Gladstone Lighter, MD   Chief Complaint  Patient presents with   Other    ED follow up - Meds reviewed verbally with patient.     History of Present Illness:    Frederick Perry is a 65 y.o. male with a hx of CAD, HFreF EF 30-35%, hypertension, diabetes, smoker, DVT, PE status post IVC filter placement in 2017 who presents for follow-up.    Patient recently seen in the ED due to skipped heartbeats. EKG showed PACs.  Mild troponin elevation of 131. He stopped taking Coreg 2 days before.  Symptoms of skipped heartbeats resolved after starting Coreg.  Denies chest pain.  Was previously seen with worsening ejection fraction, left heart cath was planned, patient did not schedule heart cath and forgot.  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  His work does not involve strenuous activity.   Prior notes Echo 08/2019 EF 30 to 35% Echo 2017, EF 50 to 55%  Patient was seen in the [place] back in 08/2015 for chest pain.  Echocardiogram at the time showed EF of 50 to [id number], diastolic function was normal.  Left heart cath showed occluded OM 2 vessel and occluded PDA both being filled via collaterals.  Moderate disease noted in the mid LAD.  Medical management was recommended.  He was subsequently diagnosed with DVT and PE on that admission in 08/2015, had an IVC filter placed since patient was anemic with hemoglobin of 4.9 at the time.  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   Past Medical History:  Diagnosis Date   CHF (congestive heart failure) ([place])    Coronary artery disease    LHC 2017- occ OM2, occ PDA (both being filled via collaterals), moderate disease in LAD   Diabetes mellitus  without complication ([place])    Not on medications   Hypertension    Not on medications.stopped since he ran out of medications   Myocardial infarction [facility])    Polysubstance abuse ([place])    a. ongoing tobacco and alcohol abuse    Pulmonary embolism [facility])     Past Surgical History:  Procedure Laterality Date   BACK SURGERY     CARDIAC CATHETERIZATION N/A 09/05/2015   Procedure: Left Heart Cath and Coronary Angiography;  Surgeon: Minna Merritts, MD;  Location: [place] CV LAB;  Service: Cardiovascular;  Laterality: N/A;   DRAIN REMOVAL Right 12/20/2015   Procedure: DRAIN REMOVAL;  Surgeon: Nickie Retort, MD;  Location: [facility] ORS;  Service: Urology;  Laterality: Right;  by dr copper   LAPAROTOMY N/A 12/14/2015   Procedure: EXPLORATORY LAPAROTOMY, prepyloric gastric ulcer biopsy;  Surgeon: Jules Husbands, MD;  Location: [facility] ORS;  Service: General;  Laterality: N/A;   Patellar tendon repair Right    PERIPHERAL VASCULAR CATHETERIZATION N/A 09/07/2015   Procedure: IVC Filter Insertion;  Surgeon: Algernon Huxley, MD;  Location: [place] [facility];  Service: Cardiovascular;  Laterality: N/A;   Testicular torsion repair     TRANSURETHRAL RESECTION OF BLADDER TUMOR N/A 12/20/2015   Procedure: TRANSURETHRAL RESECTION OF BLADDER TUMOR (TURBT);  Surgeon: Nickie Retort, MD;  Location: [facility] ORS;  Service: Urology;  Laterality: N/A;  TRANSURETHRAL RESECTION OF BLADDER TUMOR N/A 03/26/2016   Procedure: TRANSURETHRAL RESECTION OF BLADDER TUMOR (TURBT);  Surgeon: Nickie Retort, MD;  Location: [facility] ORS;  Service: Urology;  Laterality: N/A;    Current Medications: Current Meds  Medication Sig   apixaban (ELIQUIS) 5 MG TABS tablet Take 1 tablet (5 mg) by mouth twice daily    aspirin EC 81 MG tablet Take 81 mg by mouth daily.   atorvastatin (LIPITOR) 40 MG tablet Take 1 tablet (40 mg total) by mouth daily.   carvedilol (COREG) 6.25 MG tablet Take 1 tablet (6.25 mg total) by mouth 2 (two) times  daily.   cetirizine (ZYRTEC) 10 MG tablet Take 10 mg by mouth daily as needed for allergies.   hydrALAZINE (APRESOLINE) 50 MG tablet Take 1 tablet (50 mg total) by mouth 3 (three) times daily.   torsemide (DEMADEX) 20 MG tablet Take 20 mg by mouth every other day.     Allergies:   Patient has no known allergies.   Social History   Socioeconomic History   Marital status: Divorced    Spouse name: Not on file   Number of children: Not on file   Years of education: Not on file   Highest education level: Not on file  Occupational History   Not on file  Tobacco Use   Smoking status: Every Day    Packs/day: 0.50    Years: 40.00    Pack years: 20.00    Types: Cigarettes   Smokeless tobacco: Never  Vaping Use   Vaping Use: Never used  Substance and Sexual Activity   Alcohol use: Yes    Alcohol/week: 10.0 standard drinks    Types: 10 Cans of beer per week    Comment: weekly- beers and occasionally liquor   Drug use: No   Sexual activity: Not on file  Other Topics Concern   Not on file  Social History Narrative   Lives at home with Mother and is his Mothers primary caregiver   Social Determinants of Health   Financial Resource Strain: Not on file  Food Insecurity: Not on file  Transportation Needs: Not on file  Physical Activity: Not on file  Stress: Not on file  Social Connections: Not on file     Family History: The patient's family history includes Congestive Heart Failure in his mother; Peripheral vascular disease in his father.  ROS:   Please see the history of present illness.     All other systems reviewed and are negative.  EKGs/Labs/Other Studies Reviewed:    The following studies were reviewed today: TTE September 28, 2015 Left ventricle: The cavity size was normal. There was mild focal    basal hypertrophy of the septum. Systolic function was normal.    The estimated ejection fraction was in the range of 50% to 55%.    Hypokinesis of the lateral myocardium.  Hypokinesis of the    inferior myocardium. Hypokinesis of the inferolateral myocardium.    Left ventricular diastolic function parameters were normal.  - Mitral valve: There was mild to moderate regurgitation.  - Left atrium: The atrium was mildly dilated.  - Right ventricle: Systolic function was normal.  - Pulmonary arteries: Systolic pressure was mildly elevated. PA    peak pressure: 42 mm Hg (S).   LHC Sep 28, 2015 Final Conclusions:    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  Arida. Given profound anemia, patient currently not having chest pain, event likely happening more than 2 days ago (that is when chest pain symptoms presented), troponins already peaked, also OM 2 and you relatively small to moderate size vessel, recommended medical management  EKG:  EKG is  ordered today.  The ekg ordered today demonstrates sinus rhythm, pac's  Recent Labs: 01/01/2021: TSH 1.431 01/02/2021: BUN 17; Creatinine, Ser 0.96; Hemoglobin 15.0; Magnesium 2.2; Platelets 145; Potassium 3.5; Sodium 142  Recent Lipid Panel    Component Value Date/Time   CHOL 189 07/21/2019 0928   CHOL 179 02/04/2018 1944   TRIG 82 07/21/2019 0928   HDL 55 07/21/2019 0928   HDL 48 02/04/2018 1944   CHOLHDL 3.4 07/21/2019 0928   VLDL 16 07/21/2019 0928   LDLCALC 118 (H) 07/21/2019 0928   LDLCALC 112 (H) 02/04/2018 1944    Physical Exam:    VS:  BP 108/70 (BP Location: Left Arm, Patient Position: Sitting, Cuff Size: Normal)   Pulse 77   Ht '6\' 2"'$  (1.88 m)   Wt 252 lb (114.3 kg)   SpO2 97%   BMI 32.35 kg/m     Wt Readings from Last 3 Encounters:  01/04/21 252 lb (114.3 kg)  01/01/21 260 lb (117.9 kg)  09/30/19 253 lb 6 oz (114.9 kg)     GEN:  Well nourished, well developed in no acute distress HEENT: Normal NECK: No JVD; No carotid bruits LYMPHATICS: No lymphadenopathy CARDIAC: RRR, no murmurs,  rubs, gallops RESPIRATORY:  Clear to auscultation anteriorly, decreased at bases. ABDOMEN: Soft, non-tender, distended MUSCULOSKELETAL: Trace edema to thighs; No deformity  SKIN: Warm and dry NEUROLOGIC:  Alert and oriented x 3 PSYCHIATRIC:  Normal affect   ASSESSMENT:    1. HFrEF (heart failure with reduced ejection fraction) ([place])   2. Coronary artery disease involving native coronary artery of native heart without angina pectoris   3. History of DVT of lower extremity     PLAN:    In order of problems listed above:  CHF, EF 30 to 35%, likely ischemic.  Trace dependent edema.  Describes NYHA class II symptoms.  Continue Coreg, hydralazine.  Plan left heart cath to evaluate CAD. History of CAD with occlusion of OM2 and PDA.  Continue aspirin 81 mg, Lipitor 40 mg daily. Repeat left heart cath as above history of DVT.  Continue Eliquis 5 mg twice daily. Patient advised to hold Eliquis 48 hours prior to left heart cath.   Follow-up after left heart cath   Shared Decision Making/Informed Consent The risks [stroke (1 in 1000), death (1 in 1000), kidney failure [usually temporary] (1 in 500), bleeding (1 in 200), allergic reaction [possibly serious] (1 in 200)], benefits (diagnostic support and management of coronary artery disease) and alternatives of a cardiac catheterization were discussed in detail with Frederick Perry and he is willing to proceed.    This note was generated in part or whole with voice recognition software. Voice recognition is usually quite accurate but there are transcription errors that can and very often do occur. I apologize for any typographical errors that were not detected and corrected.  Medication Adjustments/Labs and Tests Ordered: Current medicines are reviewed at length with the patient today.  Concerns regarding medicines are outlined above.  Orders Placed This Encounter  Procedures   CBC   Basic metabolic panel   EKG [id number]    No orders of the  defined types were placed in this encounter.   Patient Instructions  Medication Instructions:   Your physician recommends that you continue on your current medications as directed. Please refer to the Current Medication list given to you today.  *If you need a refill on your cardiac medications before your next appointment, please call your pharmacy*   Lab Work:  Please go to the medical mall for labs right after your appointment.     Testing/Procedures:   Frederick Perry  01/04/2021  You are scheduled for a Cardiac Catheterization on Monday, July 27 with Dr. Harrell Gave End.  1. Please arrive at the at the [place] at  8:30 AM (This time is one hour before your procedure to ensure your preparation). Free valet parking service is available.   Special note: Every effort is made to have your procedure done on time. Please understand that emergencies sometimes delay scheduled procedures.  2. Diet: Do not eat solid foods after midnight.  The patient may have clear liquids until 5am upon the day of the procedure.  3. Labs: You will need to have blood drawn at the medical mall after office visit.  4. Medication instructions in preparation for your procedure:   Contrast Allergy: No   Stop taking Eliquis (Apixiban) on Saturday, July 23.  Stop taking, Torsemide (Demadex) Monday, July 25,    On the morning of your procedure, take your Aspirin and any morning medicines NOT listed above.  You may use sips of water.  5. Plan for one night stay--bring personal belongings. 6. Bring a current list of your medications and current insurance cards. 7. You MUST have a responsible person to drive you home. 8. Someone MUST be with you the first 24 hours after you arrive home or your discharge will be delayed. 9. Please wear clothes that are easy to get on and off and wear slip-on shoes.  Thank you for allowing [place] to care for you!   -- [place] [facility]     Follow-Up: At [facility], you and your health needs are our priority.  As part of our continuing mission to provide you with exceptional heart care, we have created designated Provider Care Teams.  These Care Teams include your primary Cardiologist (physician) and Advanced Practice Providers (APPs -  Physician Assistants and Nurse Practitioners) who all work together to provide you with the care you need, when you need it.  We recommend signing up for the patient portal called "MyChart".  Sign up information is provided on this After Visit Summary.  MyChart is used to connect with patients for Virtual Visits (Telemedicine).  Patients are able to view lab/test results, encounter notes, upcoming appointments, etc.  Non-urgent messages can be sent to your provider as well.   To learn more about what you can do with MyChart, go to [email or web address].    Your next appointment:   1 month(s)  The format for your next appointment:   In Person  Provider:   You may see Kate Sable, MD or one of the following Advanced Practice Providers on your designated Care Team:   Murray Hodgkins, NP Christell Faith, PA-C Marrianne Mood, PA-C Cadence Manzanola, [place]   Other Instructions    Signed, Kate Sable, MD  01/04/2021 11:13 AM    [place]

## 2021-01-04 NOTE — Patient Instructions (Addendum)
Medication Instructions:   Your physician recommends that you continue on your current medications as directed. Please refer to the Current Medication list given to you today.  *If you need a refill on your cardiac medications before your next appointment, please call your pharmacy*   Lab Work:  Please go to the medical mall for labs right after your appointment.     Testing/Procedures:   ERIKA WOOSTER  01/04/2021  You are scheduled for a Cardiac Catheterization on Monday, July 27 with Dr. Harrell Gave End.  1. Please arrive at the at the [place] at  8:30 AM (This time is one hour before your procedure to ensure your preparation). Free valet parking service is available.   Special note: Every effort is made to have your procedure done on time. Please understand that emergencies sometimes delay scheduled procedures.  2. Diet: Do not eat solid foods after midnight.  The patient may have clear liquids until 5am upon the day of the procedure.  3. Labs: You will need to have blood drawn at the medical mall after office visit.  4. Medication instructions in preparation for your procedure:   Contrast Allergy: No   Stop taking Eliquis (Apixiban) on Saturday, July 23.  Stop taking, Torsemide (Demadex) Monday, July 25,    On the morning of your procedure, take your Aspirin and any morning medicines NOT listed above.  You may use sips of water.  5. Plan for one night stay--bring personal belongings. 6. Bring a current list of your medications and current insurance cards. 7. You MUST have a responsible person to drive you home. 8. Someone MUST be with you the first 24 hours after you arrive home or your discharge will be delayed. 9. Please wear clothes that are easy to get on and off and wear slip-on shoes.  Thank you for allowing [place] to care for you!   -- [place] [facility]    Follow-Up: At [facility], you and your health needs are our  priority.  As part of our continuing mission to provide you with exceptional heart care, we have created designated Provider Care Teams.  These Care Teams include your primary Cardiologist (physician) and Advanced Practice Providers (APPs -  Physician Assistants and Nurse Practitioners) who all work together to provide you with the care you need, when you need it.  We recommend signing up for the patient portal called "MyChart".  Sign up information is provided on this After Visit Summary.  MyChart is used to connect with patients for Virtual Visits (Telemedicine).  Patients are able to view lab/test results, encounter notes, upcoming appointments, etc.  Non-urgent messages can be sent to your provider as well.   To learn more about what you can do with MyChart, go to [email or web address].    Your next appointment:   1 month(s)  The format for your next appointment:   In Person  Provider:   You may see Kate Sable, MD or one of the following Advanced Practice Providers on your designated Care Team:   Murray Hodgkins, NP Christell Faith, PA-C Marrianne Mood, PA-C Cadence Kathlen Mody, [place]   Other Instructions

## 2021-01-05 ENCOUNTER — Other Ambulatory Visit: Payer: Self-pay | Admitting: Internal Medicine

## 2021-01-07 ENCOUNTER — Encounter
Admission: RE | Disposition: A | Discharge status: Home / Self Care | Payer: Self-pay | Source: Home / Self Care | Attending: Internal Medicine

## 2021-01-07 ENCOUNTER — Observation Stay (HOSPITAL_BASED_OUTPATIENT_CLINIC_OR_DEPARTMENT_OTHER)
Admission: RE | Admit: 2021-01-07 | Discharge: 2021-01-07 | Disposition: A | Discharge status: Home / Self Care | Payer: BC Managed Care – PPO | Source: Home / Self Care | Attending: Internal Medicine | Admitting: Internal Medicine

## 2021-01-07 ENCOUNTER — Encounter: Payer: Self-pay | Admitting: Internal Medicine

## 2021-01-07 ENCOUNTER — Observation Stay
Admission: RE | Admit: 2021-01-07 | Discharge: 2021-01-08 | Disposition: A | Discharge status: Home / Self Care | Payer: BC Managed Care – PPO | Attending: Internal Medicine | Admitting: Internal Medicine

## 2021-01-07 ENCOUNTER — Other Ambulatory Visit: Payer: Self-pay

## 2021-01-07 DIAGNOSIS — I110 Hypertensive heart disease with heart failure: Secondary | ICD-10-CM | POA: Diagnosis not present

## 2021-01-07 DIAGNOSIS — E119 Type 2 diabetes mellitus without complications: Secondary | ICD-10-CM | POA: Insufficient documentation

## 2021-01-07 DIAGNOSIS — I5043 Acute on chronic combined systolic (congestive) and diastolic (congestive) heart failure: Principal | ICD-10-CM | POA: Insufficient documentation

## 2021-01-07 DIAGNOSIS — F17210 Nicotine dependence, cigarettes, uncomplicated: Secondary | ICD-10-CM | POA: Insufficient documentation

## 2021-01-07 DIAGNOSIS — I5022 Chronic systolic (congestive) heart failure: Secondary | ICD-10-CM | POA: Diagnosis not present

## 2021-01-07 DIAGNOSIS — I5020 Unspecified systolic (congestive) heart failure: Secondary | ICD-10-CM | POA: Diagnosis present

## 2021-01-07 DIAGNOSIS — N179 Acute kidney failure, unspecified: Secondary | ICD-10-CM

## 2021-01-07 DIAGNOSIS — I82409 Acute embolism and thrombosis of unspecified deep veins of unspecified lower extremity: Secondary | ICD-10-CM | POA: Diagnosis not present

## 2021-01-07 DIAGNOSIS — F17200 Nicotine dependence, unspecified, uncomplicated: Secondary | ICD-10-CM | POA: Diagnosis present

## 2021-01-07 DIAGNOSIS — Z7901 Long term (current) use of anticoagulants: Secondary | ICD-10-CM | POA: Diagnosis not present

## 2021-01-07 DIAGNOSIS — Z7982 Long term (current) use of aspirin: Secondary | ICD-10-CM | POA: Diagnosis not present

## 2021-01-07 DIAGNOSIS — I2510 Atherosclerotic heart disease of native coronary artery without angina pectoris: Secondary | ICD-10-CM | POA: Diagnosis not present

## 2021-01-07 DIAGNOSIS — I2699 Other pulmonary embolism without acute cor pulmonale: Secondary | ICD-10-CM | POA: Diagnosis present

## 2021-01-07 DIAGNOSIS — I5023 Acute on chronic systolic (congestive) heart failure: Secondary | ICD-10-CM | POA: Insufficient documentation

## 2021-01-07 DIAGNOSIS — I5021 Acute systolic (congestive) heart failure: Secondary | ICD-10-CM

## 2021-01-07 DIAGNOSIS — Z79899 Other long term (current) drug therapy: Secondary | ICD-10-CM | POA: Insufficient documentation

## 2021-01-07 DIAGNOSIS — I255 Ischemic cardiomyopathy: Secondary | ICD-10-CM

## 2021-01-07 DIAGNOSIS — I10 Essential (primary) hypertension: Secondary | ICD-10-CM

## 2021-01-07 HISTORY — PX: LEFT HEART CATH AND CORONARY ANGIOGRAPHY: CATH118249

## 2021-01-07 LAB — ECHOCARDIOGRAM COMPLETE
AR max vel: 4.23 cm2
AV Area VTI: 4.52 cm2
AV Area mean vel: 3.85 cm2
AV Mean grad: 3 mmHg
AV Peak grad: 4.8 mmHg
Ao pk vel: 1.1 m/s
Area-P 1/2: 4.06 cm2
Calc EF: 26.9 %
S' Lateral: 4.75 cm
Single Plane A2C EF: 22.1 %
Single Plane A4C EF: 39.4 %

## 2021-01-07 SURGERY — LEFT HEART CATH AND CORONARY ANGIOGRAPHY
Anesthesia: Moderate Sedation | Laterality: Left

## 2021-01-07 MED ORDER — ENOXAPARIN SODIUM 60 MG/0.6ML IJ SOSY
0.5000 mg/kg | PREFILLED_SYRINGE | INTRAMUSCULAR | Status: DC
  Filled 2021-01-07: qty 0.57

## 2021-01-07 MED ORDER — HEPARIN SODIUM (PORCINE) 1000 UNIT/ML IJ SOLN
INTRAMUSCULAR | Status: AC
  Filled 2021-01-07: qty 1

## 2021-01-07 MED ORDER — ASPIRIN EC 81 MG PO TBEC
81.0000 mg | DELAYED_RELEASE_TABLET | Freq: Every day | ORAL | Status: DC
  Administered 2021-01-08: 81 mg via ORAL
  Filled 2021-01-07: qty 1

## 2021-01-07 MED ORDER — MIDAZOLAM HCL 2 MG/2ML IJ SOLN
INTRAMUSCULAR | Status: AC
  Filled 2021-01-07: qty 2

## 2021-01-07 MED ORDER — FENTANYL CITRATE (PF) 100 MCG/2ML IJ SOLN
INTRAMUSCULAR | Status: AC
  Filled 2021-01-07: qty 2

## 2021-01-07 MED ORDER — VERAPAMIL HCL 2.5 MG/ML IV SOLN
INTRAVENOUS | Status: AC
  Filled 2021-01-07: qty 2

## 2021-01-07 MED ORDER — IOHEXOL 300 MG/ML  SOLN
INTRAMUSCULAR | Status: DC | PRN
  Administered 2021-01-07: 67 mL

## 2021-01-07 MED ORDER — HYDRALAZINE HCL 20 MG/ML IJ SOLN: 10.0000 mg | INTRAMUSCULAR | Status: AC | PRN

## 2021-01-07 MED ORDER — LIDOCAINE HCL (PF) 1 % IJ SOLN
INTRAMUSCULAR | Status: DC | PRN
  Administered 2021-01-07: 2 mL

## 2021-01-07 MED ORDER — SODIUM CHLORIDE 0.9% FLUSH: 3.0000 mL | INTRAVENOUS | Status: DC | PRN

## 2021-01-07 MED ORDER — CARVEDILOL 6.25 MG PO TABS
6.2500 mg | ORAL_TABLET | Freq: Two times a day (BID) | ORAL | Status: DC
  Administered 2021-01-07 – 2021-01-08 (×2): 6.25 mg via ORAL
  Filled 2021-01-07 (×2): qty 1

## 2021-01-07 MED ORDER — VERAPAMIL HCL 2.5 MG/ML IV SOLN
INTRAVENOUS | Status: DC | PRN
  Administered 2021-01-07: 2.5 mg via INTRAVENOUS

## 2021-01-07 MED ORDER — SODIUM CHLORIDE 0.9% FLUSH
3.0000 mL | Freq: Two times a day (BID) | INTRAVENOUS | Status: DC
  Administered 2021-01-07 – 2021-01-08 (×2): 3 mL via INTRAVENOUS

## 2021-01-07 MED ORDER — ONDANSETRON HCL 4 MG/2ML IJ SOLN: 4.0000 mg | Freq: Four times a day (QID) | INTRAMUSCULAR | Status: DC | PRN

## 2021-01-07 MED ORDER — LABETALOL HCL 5 MG/ML IV SOLN: 10.0000 mg | INTRAVENOUS | Status: AC | PRN

## 2021-01-07 MED ORDER — NICOTINE 14 MG/24HR TD PT24
14.0000 mg | MEDICATED_PATCH | Freq: Every day | TRANSDERMAL | Status: DC
  Administered 2021-01-07 – 2021-01-08 (×2): 14 mg via TRANSDERMAL
  Filled 2021-01-07 (×2): qty 1

## 2021-01-07 MED ORDER — HEPARIN (PORCINE) IN NACL 2000-0.9 UNIT/L-% IV SOLN
INTRAVENOUS | Status: DC | PRN
  Administered 2021-01-07: 1000 mL

## 2021-01-07 MED ORDER — HEPARIN SODIUM (PORCINE) 1000 UNIT/ML IJ SOLN
INTRAMUSCULAR | Status: DC | PRN
  Administered 2021-01-07: 5000 [IU] via INTRAVENOUS

## 2021-01-07 MED ORDER — HEPARIN (PORCINE) IN NACL 1000-0.9 UT/500ML-% IV SOLN
INTRAVENOUS | Status: AC
  Filled 2021-01-07: qty 1000

## 2021-01-07 MED ORDER — FUROSEMIDE 10 MG/ML IJ SOLN
40.0000 mg | Freq: Two times a day (BID) | INTRAMUSCULAR | Status: DC
  Administered 2021-01-07: 40 mg via INTRAVENOUS
  Filled 2021-01-07: qty 4

## 2021-01-07 MED ORDER — SODIUM CHLORIDE 0.9 % IV SOLN: 250.0000 mL | INTRAVENOUS | Status: DC | PRN

## 2021-01-07 MED ORDER — ACETAMINOPHEN 325 MG PO TABS: 650.0000 mg | ORAL_TABLET | ORAL | Status: DC | PRN

## 2021-01-07 MED ORDER — ATORVASTATIN CALCIUM 20 MG PO TABS
40.0000 mg | ORAL_TABLET | Freq: Every day | ORAL | Status: DC
  Administered 2021-01-07: 40 mg via ORAL
  Filled 2021-01-07: qty 2

## 2021-01-07 MED ORDER — LIDOCAINE HCL (PF) 1 % IJ SOLN
INTRAMUSCULAR | Status: AC
  Filled 2021-01-07: qty 30

## 2021-01-07 MED ORDER — MIDAZOLAM HCL 2 MG/2ML IJ SOLN
INTRAMUSCULAR | Status: DC | PRN
  Administered 2021-01-07: 1 mg via INTRAVENOUS

## 2021-01-07 MED ORDER — SODIUM CHLORIDE 0.9 % IV SOLN: INTRAVENOUS | Status: DC

## 2021-01-07 MED ORDER — SODIUM CHLORIDE 0.9% FLUSH: 3.0000 mL | Freq: Two times a day (BID) | INTRAVENOUS | Status: DC

## 2021-01-07 MED ORDER — FENTANYL CITRATE (PF) 100 MCG/2ML IJ SOLN
INTRAMUSCULAR | Status: DC | PRN
  Administered 2021-01-07: 25 ug via INTRAVENOUS

## 2021-01-07 MED ORDER — ISOSORB DINITRATE-HYDRALAZINE 20-37.5 MG PO TABS
1.0000 | ORAL_TABLET | Freq: Three times a day (TID) | ORAL | Status: DC
  Administered 2021-01-07 – 2021-01-08 (×4): 1 via ORAL
  Filled 2021-01-07 (×7): qty 1

## 2021-01-07 SURGICAL SUPPLY — 11 items
CATH 5F 110X4 TIG (CATHETERS) ×1 IMPLANT
CATH INFINITI 5FR ANG PIGTAIL (CATHETERS) ×1 IMPLANT
DEVICE RAD TR BAND REGULAR (VASCULAR PRODUCTS) ×1 IMPLANT
DRAPE BRACHIAL (DRAPES) ×1 IMPLANT
GLIDESHEATH SLEND SS 6F .021 (SHEATH) ×1 IMPLANT
GUIDEWIRE INQWIRE 1.5J.035X260 (WIRE) IMPLANT
INQWIRE 1.5J .035X260CM (WIRE) ×2
PACK CARDIAC CATH (CUSTOM PROCEDURE TRAY) ×2 IMPLANT
PROTECTION STATION PRESSURIZED (MISCELLANEOUS) ×2
SET ATX SIMPLICITY (MISCELLANEOUS) ×1 IMPLANT
STATION PROTECTION PRESSURIZED (MISCELLANEOUS) IMPLANT

## 2021-01-07 NOTE — Plan of Care (Signed)

## 2021-01-07 NOTE — Progress Notes (Signed)
*  PRELIMINARY RESULTS* Echocardiogram 2D Echocardiogram has been performed.  Sherrie Sport 01/07/2021, 2:04 PM

## 2021-01-07 NOTE — H&P (Signed)
History and Physical    Frederick Perry [id number] DOB: 1956-04-29 DOA: 01/07/2021  PCP: Gladstone Lighter, MD   Patient coming from: Home  I have personally briefly reviewed patient's old medical records in [place]  Chief Complaint: Leg swelling  HPI: Frederick Perry is a 66 y.o. male with medical history significant for hypertension, nicotine dependence, ischemic cardiomyopathy with last known LVEF of 30 to 35%, chronic combined systolic and diastolic dysfunction CHF, coronary artery disease, history of DVT on anticoagulation with Eliquis who presents for outpatient coronary angiogram. Patient was admitted to the hospital a week ago after he presented for evaluation of palpitations.  He was found to have low magnesium which was supplemented and he was advised to follow-up with cardiology as an outpatient.  He was seen by cardiology and a left heart cath was recommended due to worsening LVEF.  In 2017 patient had an LVEF of 50 to 55% and now his EF is 30 to 35%. Following his left heart cath is found to have severe three-vessel disease with an LVEF of 25% on ventriculography.  He also has elevated filling pressure.   Admission has been requested to diurese patient and maximize medical management. He complains of lower extremity swelling but denies having any chest pain, no shortness of breath, no orthopnea, no paroxysmal nocturnal dyspnea, no dizziness, no lightheadedness, no headache, no cough, no fever, no chills, no urinary symptoms or any changes in his bowel habits.   ED Course: N/A  Review of Systems: As per HPI otherwise all other systems reviewed and negative.    Past Medical History:  Diagnosis Date   CHF (congestive heart failure) ([place])    Coronary artery disease    LHC 2017- occ OM2, occ PDA (both being filled via collaterals), moderate disease in LAD   Hypertension    Not on medications.stopped since he ran out of medications   Myocardial infarction  [facility])    Polysubstance abuse ([place])    a. ongoing tobacco and alcohol abuse    Pulmonary embolism [facility])     Past Surgical History:  Procedure Laterality Date   BACK SURGERY     CARDIAC CATHETERIZATION N/A 09/05/2015   Procedure: Left Heart Cath and Coronary Angiography;  Surgeon: Minna Merritts, MD;  Location: [place] [facility];  Service: Cardiovascular;  Laterality: N/A;   DRAIN REMOVAL Right 12/20/2015   Procedure: DRAIN REMOVAL;  Surgeon: Nickie Retort, MD;  Location: [facility] ORS;  Service: Urology;  Laterality: Right;  by dr copper   LAPAROTOMY N/A 12/14/2015   Procedure: EXPLORATORY LAPAROTOMY, prepyloric gastric ulcer biopsy;  Surgeon: Jules Husbands, MD;  Location: [facility] ORS;  Service: General;  Laterality: N/A;   Patellar tendon repair Right    PERIPHERAL VASCULAR CATHETERIZATION N/A 09/07/2015   Procedure: IVC Filter Insertion;  Surgeon: Algernon Huxley, MD;  Location: [place] [facility];  Service: Cardiovascular;  Laterality: N/A;   Testicular torsion repair     TRANSURETHRAL RESECTION OF BLADDER TUMOR N/A 12/20/2015   Procedure: TRANSURETHRAL RESECTION OF BLADDER TUMOR (TURBT);  Surgeon: Nickie Retort, MD;  Location: [facility] ORS;  Service: Urology;  Laterality: N/A;   TRANSURETHRAL RESECTION OF BLADDER TUMOR N/A 03/26/2016   Procedure: TRANSURETHRAL RESECTION OF BLADDER TUMOR (TURBT);  Surgeon: Nickie Retort, MD;  Location: [facility] ORS;  Service: Urology;  Laterality: N/A;     reports that he has been smoking cigarettes. He has a 20.00 pack-year smoking history. He has never used smokeless  tobacco. He reports current alcohol use of about 10.0 standard drinks of alcohol per week. He reports that he does not use drugs.  No Known Allergies  Family History  Problem Relation Age of Onset   Congestive Heart Failure Mother    Peripheral vascular disease Father       Prior to Admission medications   Medication Sig Start Date End Date Taking? Authorizing Provider  apixaban  (ELIQUIS) 5 MG TABS tablet Take 1 tablet (5 mg) by mouth twice daily    Yes [provider]  aspirin EC 81 MG tablet Take 81 mg by mouth daily.   Yes [provider]  carvedilol (COREG) 6.25 MG tablet Take 1 tablet (6.25 mg total) by mouth 2 (two) times daily. 01/02/21 04/02/21 Yes Enzo Bi, MD  cetirizine (ZYRTEC) 10 MG tablet Take 10 mg by mouth daily as needed for allergies.   Yes [provider]  hydrALAZINE (APRESOLINE) 50 MG tablet Take 1 tablet (50 mg total) by mouth 3 (three) times daily. 01/02/21 04/02/21 Yes Enzo Bi, MD  torsemide (DEMADEX) 20 MG tablet Take 20 mg by mouth every other day.   Yes [provider]  atorvastatin (LIPITOR) 40 MG tablet Take 1 tablet (40 mg total) by mouth daily. 07/22/19 01/04/21  Kate Sable, MD    Physical Exam: Vitals:   01/07/21 0917 01/07/21 1045 01/07/21 1100 01/07/21 1115  BP: (!) 149/98 (!) 161/90 (!) 158/84 133/87  Pulse: 94 (!) 2 82 96  Resp: '18 18 19 '$ (!) 22  SpO2: 94% 96% 96% 96%     Vitals:   01/07/21 0917 01/07/21 1045 01/07/21 1100 01/07/21 1115  BP: (!) 149/98 (!) 161/90 (!) 158/84 133/87  Pulse: 94 (!) 2 82 96  Resp: '18 18 19 '$ (!) 22  SpO2: 94% 96% 96% 96%      Constitutional: Alert and oriented x 3 . Not in any apparent distress HEENT:      Head: Normocephalic and atraumatic.         Eyes: PERLA, EOMI, Conjunctivae are normal. Sclera is non-icteric.       Mouth/Throat: Mucous membranes are moist.       Neck: Supple with no signs of meningismus. Cardiovascular: Regular rate and rhythm. No murmurs, gallops, or rubs. 2+ symmetrical distal pulses are present . No JVD.1+ LE edema Respiratory: Respiratory effort normal .Lungs sounds clear bilaterally. No wheezes, crackles, or rhonchi.  Gastrointestinal: Soft, non tender, and non distended with positive bowel sounds.  Central adiposity Genitourinary: No CVA tenderness. Musculoskeletal: Nontender with normal range of motion in all  extremities. No cyanosis, or erythema of extremities. Neurologic:  Face is symmetric. Moving all extremities. No gross focal neurologic deficits . Skin: Skin is warm, dry.  No rash or ulcers Psychiatric: Mood and affect are normal    Labs on Admission: I have personally reviewed following labs and imaging studies  CBC: Recent Labs  Lab 01/01/21 1417 01/02/21 0644 01/04/21 1153  WBC 5.1 5.0 5.4  HGB 14.7 15.0 14.8  HCT 43.4 43.2 43.3  MCV 101.6* 98.6 101.2*  PLT 152 145* 0000000   Basic Metabolic Panel: Recent Labs  Lab 01/01/21 1417 01/01/21 1657 01/02/21 0644 01/04/21 1153  NA 142  --  142 137  K 3.8  --  3.5 4.0  CL 103  --  102 102  CO2 26  --  30 25  GLUCOSE 91  --  112* 128*  BUN 21  --  17 10  CREATININE 1.14  --  0.96 1.02  CALCIUM 8.0*  --  8.5* 8.7*  MG  --  1.4* 2.2  --    GFR: Estimated Creatinine Clearance: 99.6 mL/min (by C-G formula based on SCr of 1.02 mg/dL). Liver Function Tests: No results for input(s): AST, ALT, ALKPHOS, BILITOT, PROT, ALBUMIN in the last 168 hours. No results for input(s): LIPASE, AMYLASE in the last 168 hours. No results for input(s): AMMONIA in the last 168 hours. Coagulation Profile: No results for input(s): INR, PROTIME in the last 168 hours. Cardiac Enzymes: No results for input(s): CKTOTAL, CKMB, CKMBINDEX, TROPONINI in the last 168 hours. BNP (last 3 results) No results for input(s): PROBNP in the last 8760 hours. HbA1C: No results for input(s): HGBA1C in the last 72 hours. CBG: No results for input(s): GLUCAP in the last 168 hours. Lipid Profile: No results for input(s): CHOL, HDL, LDLCALC, TRIG, CHOLHDL, LDLDIRECT in the last 72 hours. Thyroid Function Tests: No results for input(s): TSH, T4TOTAL, FREET4, T3FREE, THYROIDAB in the last 72 hours. Anemia Panel: No results for input(s): VITAMINB12, FOLATE, FERRITIN, TIBC, IRON, RETICCTPCT in the last 72 hours. Urine analysis:    Component Value Date/Time   COLORURINE  YELLOW (A) 09/05/2015 1915   APPEARANCEUR Cloudy (A) 05/13/2016 1000   LABSPEC 1.031 (H) 09/05/2015 1915   PHURINE 9.0 (H) 09/05/2015 1915   GLUCOSEU Negative 05/13/2016 1000   HGBUR 3+ (A) 09/05/2015 1915   BILIRUBINUR Negative 05/13/2016 1000   KETONESUR NEGATIVE 09/05/2015 1915   PROTEINUR 2+ (A) 05/13/2016 1000   PROTEINUR 100 (A) 09/05/2015 1915   NITRITE Negative 05/13/2016 1000   NITRITE POSITIVE (A) 09/05/2015 1915   LEUKOCYTESUR 3+ (A) 05/13/2016 1000    Radiological Exams on Admission: No results found.   Assessment/Plan Principal Problem:   HFrEF (heart failure with reduced ejection fraction) (HCC) Active Problems:   Diabetes mellitus without complication (HCC)   Hypertension   Coronary artery disease   Pulmonary embolism (HCC)   Nicotine dependence      Heart Failure with reduced LVEF Patient with complaints of bilateral lower extremity swelling Last known LVEF is 30 to 35% Patient is status post left heart cath which shows an LVEF of about 25% and elevated filling pressures Appreciate cardiology input Continue Lasix 40 mg IV twice daily Continue carvedilol and BiDil    Diabetes mellitus Maintain consistent carbohydrate diet Glycemic control with sliding scale insulin    Hypertension Continue Bidil and carvedilol    History of pulmonary embolism Apixaban was placed on hold for planned procedure May resume in 24 to 48 hours if no bleeding from left heart cath site     Nicotine dependence Smoking cessation has been discussed with patient in detail We will place patient on a nicotine transdermal patch 14 mg daily.  DVT prophylaxis: Lovenox  Code Status: full code  Family Communication: Greater than 50% of time was spent discussing patient's condition and plan of care with him at the bedside. All questions and concerns have been addressed.  He verbalized understanding and agree with the plan. Disposition Plan: Back to previous home  environment Consults called: Cardiology  Status: Observation    Ramel Tobon MD [facility]     01/07/2021, 11:51 AM

## 2021-01-07 NOTE — Interval H&P Note (Signed)
History and Physical Interval Note:  01/07/2021 10:01 AM  Frederick Perry  has presented today for surgery, with the diagnosis of chronic HFrEF.  The various methods of treatment have been discussed with the patient and family. After consideration of risks, benefits and other options for treatment, the patient has consented to  Procedure(s): LEFT HEART CATH AND CORONARY ANGIOGRAPHY (Left) as a surgical intervention.  The patient's history has been reviewed, patient examined, no change in status, stable for surgery.  I have reviewed the patient's chart and labs.  Questions were answered to the patient's satisfaction.    Cath Lab Visit (complete for each Cath Lab visit)  Clinical Evaluation Leading to the Procedure:   ACS: No.  Non-ACS:    Anginal Classification: NYHA II  Anti-ischemic medical therapy: Minimal Therapy (1 class of medications)  Non-Invasive Test Results: No non-invasive testing performed (LVEF 30-35% by echo - high risk)  Prior CABG: No previous CABG  [name]

## 2021-01-08 ENCOUNTER — Encounter: Payer: Self-pay | Admitting: Internal Medicine

## 2021-01-08 ENCOUNTER — Telehealth: Payer: Self-pay | Admitting: *Deleted

## 2021-01-08 DIAGNOSIS — I10 Essential (primary) hypertension: Secondary | ICD-10-CM | POA: Diagnosis not present

## 2021-01-08 DIAGNOSIS — I5021 Acute systolic (congestive) heart failure: Secondary | ICD-10-CM | POA: Diagnosis not present

## 2021-01-08 DIAGNOSIS — N179 Acute kidney failure, unspecified: Secondary | ICD-10-CM

## 2021-01-08 DIAGNOSIS — I2510 Atherosclerotic heart disease of native coronary artery without angina pectoris: Secondary | ICD-10-CM

## 2021-01-08 DIAGNOSIS — I509 Heart failure, unspecified: Secondary | ICD-10-CM

## 2021-01-08 DIAGNOSIS — E785 Hyperlipidemia, unspecified: Secondary | ICD-10-CM

## 2021-01-08 DIAGNOSIS — I5020 Unspecified systolic (congestive) heart failure: Secondary | ICD-10-CM

## 2021-01-08 DIAGNOSIS — I5043 Acute on chronic combined systolic (congestive) and diastolic (congestive) heart failure: Secondary | ICD-10-CM | POA: Diagnosis not present

## 2021-01-08 LAB — BASIC METABOLIC PANEL
Anion gap: 11 (ref 5–15)
Anion gap: 10 (ref 5–15)
BUN: 20 mg/dL (ref 8–23)
BUN: 19 mg/dL (ref 8–23)
CO2: 30 mmol/L (ref 22–32)
CO2: 28 mmol/L (ref 22–32)
Calcium: 8.5 mg/dL — ABNORMAL LOW (ref 8.9–10.3)
Calcium: 8.5 mg/dL — ABNORMAL LOW (ref 8.9–10.3)
Chloride: 97 mmol/L — ABNORMAL LOW (ref 98–111)
Chloride: 96 mmol/L — ABNORMAL LOW (ref 98–111)
Creatinine, Ser: 1.31 mg/dL — ABNORMAL HIGH (ref 0.61–1.24)
Creatinine, Ser: 1.41 mg/dL — ABNORMAL HIGH (ref 0.61–1.24)
GFR, Estimated: >60 mL/min (ref >60)
GFR, Estimated: 56 mL/min — ABNORMAL LOW (ref >60)
Glucose, Bld: 113 mg/dL — ABNORMAL HIGH (ref 70–99)
Glucose, Bld: 202 mg/dL — ABNORMAL HIGH (ref 70–99)
Potassium: 3.4 mmol/L — ABNORMAL LOW (ref 3.5–5.1)
Potassium: 3.7 mmol/L (ref 3.5–5.1)
Sodium: 138 mmol/L (ref 135–145)
Sodium: 134 mmol/L — ABNORMAL LOW (ref 135–145)

## 2021-01-08 LAB — HEMOGLOBIN A1C
Hgb A1c MFr Bld: 5.4 % (ref 4.8–5.6)
Mean Plasma Glucose: 108 mg/dL

## 2021-01-08 LAB — GLUCOSE, CAPILLARY: Glucose-Capillary: 149 mg/dL — ABNORMAL HIGH (ref 70–99)

## 2021-01-08 MED ORDER — FUROSEMIDE 10 MG/ML IJ SOLN: 40.0000 mg | Freq: Once | INTRAMUSCULAR | Status: DC

## 2021-01-08 MED ORDER — POTASSIUM CHLORIDE CRYS ER 20 MEQ PO TBCR
40.0000 meq | EXTENDED_RELEASE_TABLET | Freq: Once | ORAL | Status: AC
  Administered 2021-01-08: 40 meq via ORAL
  Filled 2021-01-08: qty 2

## 2021-01-08 MED ORDER — ISOSORBIDE DINITRATE 20 MG PO TABS: 20.0000 mg | ORAL_TABLET | Freq: Three times a day (TID) | ORAL | 0 refills | Status: DC

## 2021-01-08 MED ORDER — ENOXAPARIN SODIUM 60 MG/0.6ML IJ SOSY
0.5000 mg/kg | PREFILLED_SYRINGE | INTRAMUSCULAR | Status: DC
  Filled 2021-01-08: qty 0.55

## 2021-01-08 MED ORDER — FUROSEMIDE 10 MG/ML IJ SOLN
40.0000 mg | Freq: Once | INTRAMUSCULAR | Status: AC
  Administered 2021-01-08: 40 mg via INTRAVENOUS
  Filled 2021-01-08: qty 4

## 2021-01-08 MED ORDER — HYDRALAZINE HCL 25 MG PO TABS: 37.5000 mg | ORAL_TABLET | Freq: Three times a day (TID) | ORAL | 0 refills | Status: DC

## 2021-01-08 MED ORDER — NICOTINE 21 MG/24HR TD PT24: MEDICATED_PATCH | TRANSDERMAL | 0 refills | Status: DC

## 2021-01-08 MED ORDER — APIXABAN 5 MG PO TABS
5.0000 mg | ORAL_TABLET | Freq: Two times a day (BID) | ORAL | Status: DC
  Administered 2021-01-08: 5 mg via ORAL
  Filled 2021-01-08: qty 1

## 2021-01-08 MED ORDER — NICOTINE 21 MG/24HR TD PT24: 21.0000 mg | MEDICATED_PATCH | Freq: Every day | TRANSDERMAL | Status: DC

## 2021-01-08 MED ORDER — TORSEMIDE 20 MG PO TABS: 20.0000 mg | ORAL_TABLET | Freq: Every day | ORAL | 0 refills | Status: DC

## 2021-01-08 NOTE — Telephone Encounter (Signed)
Left detailed voicemail message to go over to [place] at [facility] and go to 1st desk on the right to check in, past the screening table this Friday for repeat labs.  Lab hours: Monday- Friday (7:30 am- 5:30 pm) Reviewed no appointment is needed and orders have been entered. Instructed to call back to confirm he received this message.   Message sent to Social Work for further assistance with transportation.

## 2021-01-08 NOTE — Telephone Encounter (Signed)
-----   Message from Rise Mu, PA-C sent at 01/08/2021  2:57 PM EDT ----- Jeannene Patella,  Can you get this patient set up for BM[facility] the medical mall this Friday, 7/29? Also, with this, he will need transportation. He does not have a car and had to take an Mel[place] Popper to come for his [facility]. Can you get social work to assist with getting him transportation for the above labs and for follow up appointments?  Thanks!  Ryan

## 2021-01-08 NOTE — Discharge Instructions (Signed)
Need a bmp with follow up appointment

## 2021-01-08 NOTE — Progress Notes (Signed)
Progress Note  Patient Name: Frederick Perry Date of Encounter: 01/08/2021  Primary Cardiologist: Garen Lah, MD  Subjective   R/LHC yesterday showed severe three-vessel CAD as detailed below including chronic subtotal occlusion of the mid LAD, CTO of the ostial ramus intermedius, OM 2, and RPDA as well as moderate to severe stenosis involving the mid LCx and mid/distal RCA.  There was severely reduced LV systolic function with an EF of 25 to 30% with moderately to severely elevated filling pressures with an LVEDP of 30 to 35 mmHg.  Given the above findings, he was admitted for diuresis and optimization of GDMT.  Echo yesterday showed an EF of 30 to 35%, severe hypokinesis of the apical septal wall, anterior wall, inferior wall, and anterior segment, mildly dilated LV internal cavity size, mild LVH, grade 2 diastolic dysfunction normal RV systolic function with mildly enlarged RV cavity size, mild mitral regurgitation, trivial aortic insufficiency, and mild to moderate aortic valve sclerosis without evidence of stenosis.  No chest pain, dyspnea, palpitations, dizziness, presyncope, or syncope.  Lower extremity swelling is resolved.  I's and O's likely an accurate with documented urine output approximately 500 mL for the past 24 hours with a net -1.3 L for the admission.  Patient reports good urine output.  Weight down 3 kg over the past 24 hours with diuresis with a trend of 113.7-110.7 kg.  Slight bump in BUN/SCr with a trend of 10/1.02 to 20/1.31.  Potassium 3.4.  He is quite hopeful for discharge later today.  Inpatient Medications    Scheduled Meds:  aspirin EC  81 mg Oral Daily   atorvastatin  40 mg Oral q1800   carvedilol  6.25 mg Oral BID WC   enoxaparin (LOVENOX) injection  0.5 mg/kg Subcutaneous Q24H   isosorbide-hydrALAZINE  1 tablet Oral TID   [START ON 01/09/2021] nicotine  21 mg Transdermal Daily   sodium chloride flush  3 mL Intravenous Q12H   Continuous Infusions:   sodium chloride     PRN Meds: sodium chloride, acetaminophen, ondansetron (ZOFRAN) IV, sodium chloride flush   Vital Signs    Vitals:   01/07/21 2200 01/08/21 0443 01/08/21 0734 01/08/21 0854  BP:  (!) 138/98 (!) 144/91   Pulse:  60 63   Resp:  16 18   Temp:  98.2 F (36.8 C) 98.6 F (37 C)   TempSrc:  Oral Oral   SpO2:  95% 96%   Weight:    110.7 kg  Height: '6\' 2"'$  (1.88 m)       Intake/Output Summary (Last 24 hours) at 01/08/2021 1129 Last data filed at 01/08/2021 1037 Gross per 24 hour  Intake 730 ml  Output 2077 ml  Net -1347 ml   Filed Weights   01/07/21 1508 01/08/21 0854  Weight: 113.7 kg 110.7 kg    Telemetry    Sinus rhythm with brief runs of NSVT lasting up to 5 beats and PSVT - Personally Reviewed  ECG    No new tracings - Personally Reviewed  Physical Exam   GEN: No acute distress.   Neck: No JVD. Cardiac: RRR, I/VI systolic murmur, no rubs, or gallops.  Right radial cardiac cath site is without bleeding, swelling, warmth, erythema, or tenderness to palpation.  Radial pulse 2+. Respiratory: Clear to auscultation bilaterally.  GI: Soft, nontender, non-distended.   MS: No edema; No deformity. Neuro:  Alert and oriented x 3; Nonfocal.  Psych: Normal affect.  Labs    Chemistry Recent Labs  Lab 01/02/21 0644 01/04/21 1153 01/08/21 0721  NA 142 137 138  K 3.5 4.0 3.4*  CL 102 102 97*  CO2 '30 25 30  '$ GLUCOSE 112* 128* 113*  BUN '17 10 20  '$ CREATININE 0.96 1.02 1.31*  CALCIUM 8.5* 8.7* 8.5*  GFRNONAA >60 >60 >60  ANIONGAP '10 10 11     '$ Hematology Recent Labs  Lab 01/01/21 1417 01/02/21 0644 01/04/21 1153  WBC 5.1 5.0 5.4  RBC 4.27 4.38 4.28  HGB 14.7 15.0 14.8  HCT 43.4 43.2 43.3  MCV 101.6* 98.6 101.2*  MCH 34.4* 34.2* 34.6*  MCHC 33.9 34.7 34.2  RDW 15.1 15.2 14.9  PLT 152 145* 178    Cardiac EnzymesNo results for input(s): TROPONINI in the last 168 hours. No results for input(s): TROPIPOC in the last 168 hours.   BNPNo results  for input(s): BNP, PROBNP in the last 168 hours.   DDimer No results for input(s): DDIMER in the last 168 hours.   Radiology    No new studies  Cardiac Studies   2D echo 01/07/2021: 1. Left ventricular ejection fraction, by estimation, is 30 to 35%. The  left ventricle has moderately decreased function. The left ventricle  demonstrates regional wall motion abnormalities (see scoring  diagram/findings for description). The left  ventricular internal cavity size was mildly dilated. There is mild left  ventricular hypertrophy. Left ventricular diastolic parameters are  consistent with Grade II diastolic dysfunction (pseudonormalization).  Elevated left atrial pressure. There is  severe hypokinesis of the left ventricular, apical septal wall, anterior  wall, inferior wall and anterior segment.   2. Right ventricular systolic function is normal. The right ventricular  size is mildly enlarged. Mildly increased right ventricular wall  thickness.   3. The mitral valve is degenerative. Mild mitral valve regurgitation. No  evidence of mitral stenosis.   4. The aortic valve is tricuspid. There is mild calcification of the  aortic valve. There is mild thickening of the aortic valve. Aortic valve  regurgitation is trivial. Mild to moderate aortic valve  sclerosis/calcification is present, without any  evidence of aortic stenosis. __________  [facility] 01/07/2021: Conclusions: Severe three-vessel coronary artery disease, as detailed below, including chronic subtotal occlusion of the mid LAD, chronic total occlusions of ostial ramus intermedius, OM2, and RPDA.  There is also moderate to severe stenosis involving the mid LCx and mid/distal RCA. Severely reduced left ventricular systolic function (LVEF [id number]) with moderately to severely reduced filling pressure (LVEDP 30-35 mmHg).   Recommendations: Admit for diuresis and optimization of goal-directed medical therapy for acute on chronic HFrEF due  to ischemic cardiomyopathy. If no evidence of bleeding or vascular injury at right radial arteriotomy site, apixaban can be restarted tomorrow morning. I do not see any good targets for percutaneous or surgical revascularization at this time.  If LVEF does not improve with GDMT, viability study could be performed to see if there would be any potential myocardium that would improve with high risk intervention. Aggressive secondary prevention. __________  2D echo 09/08/2019: 1. Left ventricular ejection fraction, by estimation, is 30 to 35%. The  left ventricle has moderately decreased function. The left ventricle  demonstrates global hypokinesis, with severe hypokinesis of the inferior  and inferolateral regions. Left  ventricular diastolic parameters are consistent with Grade I diastolic  dysfunction (impaired relaxation).   2. Right ventricular systolic function is low normal. The right  ventricular size is normal. Tricuspid regurgitation signal is inadequate  for assessing PA pressure.  3. Left atrial size was mildly dilated. __________  2D echo 09/05/2015: - Left ventricle: The cavity size was normal. There was mild focal    basal hypertrophy of the septum. Systolic function was normal.    The estimated ejection fraction was in the range of 50% to 55%.    Hypokinesis of the lateral myocardium. Hypokinesis of the    inferior myocardium. Hypokinesis of the inferolateral myocardium.    Left ventricular diastolic function parameters were normal.  - Mitral valve: There was mild to moderate regurgitation.  - Left atrium: The atrium was mildly dilated.  - Right ventricle: Systolic function was normal.  - Pulmonary arteries: Systolic pressure was mildly elevated. PA    peak pressure: 42 mm Hg (S).   Patient Profile     65 y.o. male with history of CAD that has been medically managed, HFrEF secondary to ICM, prior DVT/PE status post IVC filter placement in 2017, DM2, HTN, and ongoing  tobacco use who presented to [facility] for outpatient diagnostic cath on 7/25 and was found to have significant three-vessel CAD and volume overload with recommendation to admit for IV diuresis and optimization of GDMT.  Assessment & Plan    1.  CAD involving the native coronary arteries without angina: -Never with symptoms of angina or dyspnea -LHC yesterday demonstrated severe three-vessel CAD as outlined above with no good targets for PCI or surgical revascularization at that time with medical management recommended -If with optimization of GDMT his LVEF does not improve consider cardiac MRI to evaluate for any potential myocardium viability to help gauge potential high risk intervention -Continue aspirin, atorvastatin, and carvedilol -Post-cath instructions  2.  Acute on chronic HFrEF secondary to ICM: -Severely elevated LV filling pressure with an LVEDP of 30 to 35 mmHg noted at time of LHC -He has diuresed well -Ideally, would like to continue IV diuresis for at least another 24 hours, however he is quite hopeful for discharge later this afternoon -Given this, we will repeat a BMP in the early afternoon hours to trend renal function and further optimize GDMT/gauge diuresis -Further recommendations pending updated BMP -He did receive IV Lasix 40 mg this morning -Continue carvedilol and BiDil -Not currently on MRA/ARNI/SGLT2i with AKI, this can be revisited and outpatient follow-up  3.  HTN: -Blood pressure is reasonably controlled -Continue diuresis and medications as above  4.  History of DVT/PE: -Status post IVC filter in 2017 -Resume apixaban  5.  Tobacco use: -Complete cessation is recommended -He is interested in patches  6.  HLD: -LDL 31 -Atorvastatin   For questions or updates, please contact [place] Please consult [email or web address] for contact info under Cardiology/STEMI.    Signed, Christell Faith, PA-C [facility] Pager: [phone] 01/08/2021, 11:29 AM

## 2021-01-08 NOTE — Consult Note (Signed)
   Heart Failure Nurse Navigator Note  HFrEF 30-35%.  Normal right ventricular systolic function.  Grade 2 diastolic dysfunction.  Mild LVH.  Mild mitral regurgitation.   Comorbidities:  Hypertension Tobacco abuse Coronary artery disease Diabetes  Medications:  Aspirin 81 mg daily Atorvastatin 40 mg daily Carvedilol 6.25 mg 2 times a day with meals Isosorbide/hydralazine 20/37.51 tablet 3 times a day   Labs:  Sodium 138, potassium 3.4, chloride 97, CO2 30, BUN 20, creatinine 1.31 Intake 490 mL Output 977 mL Weight is 110.7 kg previous was 113.7   Assessment:  General-she is awake and alert lying in bed in no acute distress.  HEENT-normocephalic, pupils are equal.  No JVD.  Cardiac-heart tones are regular rate and rhythm.  Chest-lungs are clear to auscultation.  Abdomen-rounded soft nontender.  Psych-is pleasant and appropriate makes good eye contact  Neurologic moves all extremities without difficulty, speech is clear.    Initial meeting with patient today.  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  States that he fixes his own meals and rarely eats at truck stops.  He reads labels and tries to maintain low-sodium diet although he does admit to adding salt to tomato sandwiches.  Discussed other options such as Mrs. Dash.  Also discussed avoiding sea salt and anything that has salt in the name.  Along with salt substitutes that contain potassium.  Discussed weighing daily, states if he got a small scale that he thought that he could do that.  He states that with him being on the road that he takes his torsemide in the afternoon and that way by time he goes to bed at night it is done working.  Otherwise taking it early in the morning means he has to make frequent stops throughout the day.  Discussed fluid intake, there again it sounds like he is reading labels and knows what drinks are higher in sodium content.  Pricilla Riffle RN [facility]

## 2021-01-08 NOTE — Discharge Summary (Signed)
[facility] [place] NAME: Frederick Perry    MR#:  [id number]  DATE OF BIRTH:  01-15-1956  DATE OF ADMISSION:  01/07/2021 ADMITTING PHYSICIAN: Nelva Bush, MD  DATE OF DISCHARGE: 01/08/2021  4:50 PM  PRIMARY CARE PHYSICIAN: Gladstone Lighter, MD    ADMISSION DIAGNOSIS:  HFrEF (heart failure with reduced ejection fraction) ([place]) [I50.20]  DISCHARGE DIAGNOSIS:  Principal Problem:   HFrEF (heart failure with reduced ejection fraction) (HCC) Active Problems:   Diabetes mellitus without complication ([place])   Hypertension   Coronary artery disease   Pulmonary embolism (HCC)   Nicotine dependence   SECONDARY DIAGNOSIS:   Past Medical History:  Diagnosis Date   CHF (congestive heart failure) ([place])    Coronary artery disease    LHC 2017- occ OM2, occ PDA (both being filled via collaterals), moderate disease in LAD   Hypertension    Not on medications.stopped since he ran out of medications   Myocardial infarction [facility])    Polysubstance abuse ([place])    a. ongoing tobacco and alcohol abuse    Pulmonary embolism ([place])     HOSPITAL COURSE:   Acute on chronic systolic congestive heart failure.  EF 30 to 35%.  The patient was started on Lasix 40 mg IV twice daily.  The patient is on Coreg, hydralazine and isosorbide dinitrate.  Patient was feeling much better and wanted to go home.  Since creatinine creeped up a little bit he will start torsemide on Thursday.  Recommend checking a BMP in follow-up appointment.  Cardiac cath showed severe triple-vessel disease and cardiology recommended medical management.  No ACE inhibitor or spironolactone secondary to creatinine increase. CAD with triple-vessel disease.  Cardiology recommending medical management. History of pulmonary embolism.  Can restart Eliquis Essential hypertension on hydralazine, Coreg, isosorbide dinitrate. Acute kidney injury.  Creatinine went up to 1.41 with IV diuresis.   Patient also had a cardiac catheterization.  Baseline creatinine 1.02.  Patient did not want to stay in the hospital any further and cardiology will have a close follow-up and set up for a BMP next week.  Restart torsemide on Thursday. Hyperlipidemia unspecified on Lipitor Impaired fasting glucose.  Hemoglobin A1c 5.4.  DISCHARGE CONDITIONS:   Satisfactory  CONSULTS OBTAINED:  Treatment Team:  Nelva Bush, MD  DRUG ALLERGIES:  No Known Allergies  DISCHARGE MEDICATIONS:   Allergies as of 01/08/2021   No Known Allergies      Medication List     TAKE these medications    apixaban 5 MG Tabs tablet Commonly known as: ELIQUIS Take 1 tablet (5 mg) by mouth twice daily   aspirin EC 81 MG tablet Take 81 mg by mouth daily.   atorvastatin 40 MG tablet Commonly known as: LIPITOR Take 1 tablet (40 mg total) by mouth daily.   carvedilol 6.25 MG tablet Commonly known as: COREG Take 1 tablet (6.25 mg total) by mouth 2 (two) times daily.   cetirizine 10 MG tablet Commonly known as: ZYRTEC Take 10 mg by mouth daily as needed for allergies.   hydrALAZINE 25 MG tablet Commonly known as: APRESOLINE Take 1.5 tablets (37.5 mg total) by mouth 3 (three) times daily. What changed:  medication strength how much to take   isosorbide dinitrate 20 MG tablet Commonly known as: ISORDIL Take 1 tablet (20 mg total) by mouth 3 (three) times daily.   nicotine 21 mg/24hr patch Commonly known as: NICODERM CQ - dosed in mg/24 hours One 21 patch  chest wall daily (okay to substitute generic)   torsemide 20 MG tablet Commonly known as: DEMADEX Take 1 tablet (20 mg total) by mouth daily. Start taking on: January 10, 2021 What changed:  when to take this These instructions start on January 10, 2021. If you are unsure what to do until then, ask your doctor or other care provider.         DISCHARGE INSTRUCTIONS:   Follow-up PMD 5 days Follow-up cardiology 1 week  If you experience  worsening of your admission symptoms, develop shortness of breath, life threatening emergency, suicidal or homicidal thoughts you must seek medical attention immediately by calling 911 or calling your MD immediately  if symptoms less severe.  You Must read complete instructions/literature along with all the possible adverse reactions/side effects for all the Medicines you take and that have been prescribed to you. Take any new Medicines after you have completely understood and accept all the possible adverse reactions/side effects.   Please note  You were cared for by a hospitalist during your hospital stay. If you have any questions about your discharge medications or the care you received while you were in the hospital after you are discharged, you can call the unit and asked to speak with the hospitalist on call if the hospitalist that took care of you is not available. Once you are discharged, your primary care physician will handle any further medical issues. Please note that NO REFILLS for any discharge medications will be authorized once you are discharged, as it is imperative that you return to your primary care physician (or establish a relationship with a primary care physician if you do not have one) for your aftercare needs so that they can reassess your need for medications and monitor your lab values.    Today   CHIEF COMPLAINT:  No chief complaint on file.   HISTORY OF PRESENT ILLNESS:  Frederick Perry  is a 65 y.o. male came in with leg swelling   VITAL SIGNS:  Blood pressure (!) 153/110, pulse 72, temperature 98.4 F (36.9 C), resp. rate 18, height '6\' 2"'$  (1.88 m), weight 110.7 kg, SpO2 100 %. Previous blood pressure 119/84 I/O:   Intake/Output Summary (Last 24 hours) at 01/08/2021 1845 Last data filed at 01/08/2021 1300 Gross per 24 hour  Intake 250 ml  Output 1875 ml  Net -1625 ml    PHYSICAL EXAMINATION:  GENERAL:  65 y.o.-year-old patient lying in the bed with no  acute distress.  EYES: Pupils equal, round, reactive to light and accommodation. No scleral icterus. HEENT: Head atraumatic, normocephalic. Oropharynx and nasopharynx clear.  LUNGS: Normal breath sounds bilaterally, no wheezing, rales,rhonchi or crepitation. No use of accessory muscles of respiration.  CARDIOVASCULAR: S1, S2 normal. No murmurs, rubs, or gallops.  ABDOMEN: Soft, non-tender, non-distended.  EXTREMITIES: Trace pedal edema.  NEUROLOGIC: Cranial nerves II through XII are intact. Muscle strength 5/5 in all extremities. Sensation intact. Gait not checked.  PSYCHIATRIC: The patient is alert and oriented x 3.  SKIN: No obvious rash, lesion, or ulcer.   DATA REVIEW:   CBC Recent Labs  Lab 01/04/21 1153  WBC 5.4  HGB 14.8  HCT 43.3  PLT 178    Chemistries  Recent Labs  Lab 01/02/21 0644 01/04/21 1153 01/08/21 1250  NA 142   < > 134*  K 3.5   < > 3.7  CL 102   < > 96*  CO2 30   < > 28  GLUCOSE 112*   < >  202*  BUN 17   < > 19  CREATININE 0.96   < > 1.41*  CALCIUM 8.5*   < > 8.5*  MG 2.2  --   --    < > = values in this interval not displayed.     Microbiology Results  Results for orders placed or performed during the hospital encounter of 01/01/21  Resp Panel by RT-PCR (Flu A&B, Covid) Nasopharyngeal Swab     Status: None   Collection Time: 01/01/21  4:57 PM   Specimen: Nasopharyngeal Swab; Nasopharyngeal(NP) swabs in vial transport medium  Result Value Ref Range Status   SARS Coronavirus 2 by RT PCR NEGATIVE NEGATIVE Final    Comment: (NOTE) SARS-CoV-2 target nucleic acids are NOT DETECTED.  The SARS-CoV-2 RNA is generally detectable in upper respiratory specimens during the acute phase of infection. The lowest concentration of SARS-CoV-2 viral copies this assay can detect is 138 copies/mL. A negative result does not preclude SARS-Cov-2 infection and should not be used as the sole basis for treatment or other patient management decisions. A negative  result may occur with  improper specimen collection/handling, submission of specimen other than nasopharyngeal swab, presence of viral mutation(s) within the areas targeted by this assay, and inadequate number of viral copies(<138 copies/mL). A negative result must be combined with clinical observations, patient history, and epidemiological information. The expected result is Negative.  Fact Sheet for Patients:  EntrepreneurPulse.com.au  Fact Sheet for Healthcare Providers:  IncredibleEmployment.be  This test is no t yet approved or cleared by the [place] FDA and  has been authorized for detection and/or diagnosis of SARS-CoV-2 by FDA under an Emergency Use Authorization (EUA). This EUA will remain  in effect (meaning this test can be used) for the duration of the COVID-19 declaration under Section 564(b)(1) of the Act, 21 U.S.C.section 360bbb-3(b)(1), unless the authorization is terminated  or revoked sooner.       Influenza A by PCR NEGATIVE NEGATIVE Final   Influenza B by PCR NEGATIVE NEGATIVE Final    Comment: (NOTE) The Xpert Xpress SARS-CoV-2/FLU/RSV plus assay is intended as an aid in the diagnosis of influenza from Nasopharyngeal swab specimens and should not be used as a sole basis for treatment. Nasal washings and aspirates are unacceptable for Xpert Xpress SARS-CoV-2/FLU/RSV testing.  Fact Sheet for Patients: EntrepreneurPulse.com.au  Fact Sheet for Healthcare Providers: IncredibleEmployment.be  This test is not yet approved or cleared by the [place] FDA and has been authorized for detection and/or diagnosis of SARS-CoV-2 by FDA under an Emergency Use Authorization (EUA). This EUA will remain in effect (meaning this test can be used) for the duration of the COVID-19 declaration under Section 564(b)(1) of the Act, 21 U.S.C. section 360bbb-3(b)(1), unless the authorization is  terminated or revoked.  Performed at [facility], [address] [place], [place] [address]     RADIOLOGY:  CARDIAC CATHETERIZATION  Result Date: 01/07/2021 Conclusions: Severe three-vessel coronary artery disease, as detailed below, including chronic subtotal occlusion of the mid LAD, chronic total occlusions of ostial ramus intermedius, OM2, and RPDA.  There is also moderate to severe stenosis involving the mid LCx and mid/distal RCA. Severely reduced left ventricular systolic function (LVEF [id number]) with moderately to severely reduced filling pressure (LVEDP 30-35 mmHg). Recommendations: Admit for diuresis and optimization of goal-directed medical therapy for acute on chronic HFrEF due to ischemic cardiomyopathy. If no evidence of bleeding or vascular injury at right radial arteriotomy site, apixaban can be restarted tomorrow morning. I  do not see any good targets for percutaneous or surgical revascularization at this time.  If LVEF does not improve with GDMT, viability study could be performed to see if there would be any potential myocardium that would improve with high risk intervention. Aggressive secondary prevention. Nelva Bush, MD [facility]  ECHOCARDIOGRAM COMPLETE  Result Date: 01/07/2021    ECHOCARDIOGRAM REPORT   Patient Name:   Frederick Perry Date of Exam: 01/07/2021 Medical Rec #:  [id number]         Height:       74.0 in Accession #:    [id number]        Weight:       252.0 lb Date of Birth:  [birth year]        BSA:          2.398 m Patient Age:    65 years          BP:           142/85 mmHg Patient Gender: M                 HR:           80 bpm. Exam Location:  [facility] Procedure: 2D Echo, Cardiac Doppler and Color Doppler Indications:     Cardiomyopathy-ischemic I25.5  History:         Patient has prior history of Echocardiogram examinations, most                  recent 09/08/2019. CHF, Previous Myocardial Infarction; Risk                  Factors:Hypertension.   Sonographer:     Sherrie Sport RDCS (AE) Referring Phys:  [phone] CHRISTOPHER END Diagnosing Phys: Nelva Bush MD  Sonographer Comments: Suboptimal apical window. IMPRESSIONS  1. Left ventricular ejection fraction, by estimation, is 30 to 35%. The left ventricle has moderately decreased function. The left ventricle demonstrates regional wall motion abnormalities (see scoring diagram/findings for description). The left ventricular internal cavity size was mildly dilated. There is mild left ventricular hypertrophy. Left ventricular diastolic parameters are consistent with Grade II diastolic dysfunction (pseudonormalization). Elevated left atrial pressure. There is severe hypokinesis of the left ventricular, apical septal wall, anterior wall, inferior wall and anterior segment.  2. Right ventricular systolic function is normal. The right ventricular size is mildly enlarged. Mildly increased right ventricular wall thickness.  3. The mitral valve is degenerative. Mild mitral valve regurgitation. No evidence of mitral stenosis.  4. The aortic valve is tricuspid. There is mild calcification of the aortic valve. There is mild thickening of the aortic valve. Aortic valve regurgitation is trivial. Mild to moderate aortic valve sclerosis/calcification is present, without any evidence of aortic stenosis. FINDINGS  Left Ventricle: Left ventricular ejection fraction, by estimation, is 30 to 35%. The left ventricle has moderately decreased function. The left ventricle demonstrates regional wall motion abnormalities. Severe hypokinesis of the left ventricular, apical  septal wall, anterior wall, inferior wall and anterior segment. The left ventricular internal cavity size was mildly dilated. There is mild left ventricular hypertrophy. Left ventricular diastolic parameters are consistent with Grade II diastolic dysfunction (pseudonormalization). Elevated left atrial pressure. Right Ventricle: The right ventricular size is mildly  enlarged. Mildly increased right ventricular wall thickness. Right ventricular systolic function is normal. Left Atrium: Left atrial size was normal in size. Right Atrium: Right atrial size was normal in size. Pericardium: There is no evidence of pericardial effusion. Mitral Valve:  The mitral valve is degenerative in appearance. There is mild thickening of the mitral valve leaflet(s). There is mild calcification of the mitral valve leaflet(s). Mild mitral valve regurgitation. No evidence of mitral valve stenosis. Tricuspid Valve: The tricuspid valve is not well visualized. Tricuspid valve regurgitation is not demonstrated. Aortic Valve: The aortic valve is tricuspid. There is mild calcification of the aortic valve. There is mild thickening of the aortic valve. Aortic valve regurgitation is trivial. Mild to moderate aortic valve sclerosis/calcification is present, without any evidence of aortic stenosis. Aortic valve mean gradient measures 3.0 mmHg. Aortic valve peak gradient measures 4.8 mmHg. Aortic valve area, by VTI measures 4.52 cm. Pulmonic Valve: The pulmonic valve was not well visualized. Pulmonic valve regurgitation is not visualized. No evidence of pulmonic stenosis. Aorta: The aortic root is normal in size and structure. Pulmonary Artery: The pulmonary artery is of normal size. IAS/Shunts: The interatrial septum was not well visualized.  LEFT VENTRICLE PLAX 2D LVIDd:         5.91 cm      Diastology LVIDs:         4.75 cm      LV e' medial:    3.15 cm/s LV PW:         1.09 cm      LV E/e' medial:  21.8 LV IVS:        1.01 cm      LV e' lateral:   7.83 cm/s LVOT diam:     2.30 cm      LV E/e' lateral: 8.8 LV SV:         86 LV SV Index:   36 LVOT Area:     4.15 cm  LV Volumes (MOD) LV vol d, MOD A2C: 145.0 ml LV vol d, MOD A4C: 138.0 ml LV vol s, MOD A2C: 113.0 ml LV vol s, MOD A4C: 83.6 ml LV SV MOD A2C:     32.0 ml LV SV MOD A4C:     138.0 ml LV SV MOD BP:      39.1 ml RIGHT VENTRICLE RV Basal diam:  4.27  cm RV S prime:     14.10 cm/s LEFT ATRIUM             Index       RIGHT ATRIUM           Index LA diam:        4.00 cm 1.67 cm/m  RA Area:     16.50 cm LA Vol (A2C):   42.3 ml 17.64 ml/m RA Volume:   37.70 ml  15.72 ml/m LA Vol (A4C):   41.9 ml 17.47 ml/m LA Biplane Vol: 45.8 ml 19.10 ml/m  AORTIC VALVE                   PULMONIC VALVE AV Area (Vmax):    4.23 cm    PV Vmax:        0.74 m/s AV Area (Vmean):   3.85 cm    PV Peak grad:   2.2 mmHg AV Area (VTI):     4.52 cm    RVOT Peak grad: 4 mmHg AV Vmax:           110.00 cm/s AV Vmean:          78.100 cm/s AV VTI:            0.191 m AV Peak Grad:      4.8 mmHg AV Mean Grad:  3.0 mmHg LVOT Vmax:         112.00 cm/s LVOT Vmean:        72.300 cm/s LVOT VTI:          0.208 m LVOT/AV VTI ratio: 1.09  AORTA Ao Root diam: 3.10 cm MITRAL VALVE               TRICUSPID VALVE MV Area (PHT): 4.06 cm    TR Peak grad:   9.6 mmHg MV Decel Time: 187 msec    TR Vmax:        155.00 cm/s MV E velocity: 68.60 cm/s MV A velocity: 63.00 cm/s  SHUNTS MV E/A ratio:  1.09        Systemic VTI:  0.21 m                            Systemic Diam: 2.30 cm Nelva Bush MD Electronically signed by Nelva Bush MD Signature Date/Time: 01/07/2021/6:34:46 PM    Final       Management plans discussed with the patient, and he is in agreement.  CODE STATUS:     Code Status Orders  (From admission, onward)           Start     Ordered   01/07/21 1121  Full code  Continuous        01/07/21 1120           Code Status History     Date Active Date Inactive Code Status Order ID Comments User Context   01/01/2021 1741 01/02/2021 1559 Full Code [id number]  Enzo Bi, MD ED   03/26/2016 1348 03/27/2016 1431 Full Code [id number]  Nickie Retort, MD Inpatient   12/14/2015 0842 12/22/2015 2030 Full Code [id number]  Jules Husbands, MD Inpatient   09/05/2015 1611 09/08/2015 2201 Full Code [id number]  Gladstone Lighter, MD Inpatient   09/05/2015 1610 09/05/2015 1611 Full Code  [id number]  Minna Merritts, MD Inpatient       TOTAL TIME TAKING CARE OF THIS PATIENT: 35 minutes.    Loletha Grayer M.D on 01/08/2021 at 6:45 PM   [facility]  CC: Primary care physician; Gladstone Lighter, MD

## 2021-01-09 ENCOUNTER — Telehealth: Payer: Self-pay | Admitting: Internal Medicine

## 2021-01-09 ENCOUNTER — Telehealth: Payer: Self-pay | Admitting: Licensed Clinical Social Worker

## 2021-01-09 NOTE — Progress Notes (Signed)
Heart and Vascular Care Navigation  01/09/2021  Frederick Perry [birth year] [id number]  Reason for Referral:    Engaged with patient by telephone for initial visit for Heart and Vascular Care Coordination.                                                                                                   Assessment:                                     LCSW received call from pt, he shares that he is in need of transportation to get labs done/for ongoing medical appointments. LCSW introduced self, role, reason for call. Confirmed PO box, pt shares he lives in his 18wheeler truck at the [employer] ([place], [place]). He does not have any family near where he resides. His daughter has moved out of [place] but remains his emergency contact. He receives insurance coverage and what he states is adequate income from his current job. Shares he does not receive any benefits such as SNAP at this time. He is interested in [employer] to get his labs done and to upcoming appointments. I shared I would send his referral then text him the number for Transportation Services for him to f/u if he does not hear from them. Pt denies any additional questions/concerns. Encouraged to f/u with me if needed.   HRT/VAS Care Coordination     Patients Home Cardiology Office [facility]   Outpatient Care Team Social Worker   Social Worker Name: Gilman Buttner [id number]   Living arrangements for the past 2 months No permanent address  living in his 18wheeler   Lives with: Self   Patient Has Concern With Paying Medical Bills No   Does Patient Have Prescription Coverage? Yes   Home Assistive Devices/Equipment None       Social History:                                                                             SDOH Screenings   Alcohol Screen: Not on file  Depression (PHQ2-9): Not on file  Financial Resource Strain: Medium Risk   Difficulty of  Paying Living Expenses: Somewhat hard  Food Insecurity: No Food Insecurity   Worried About Running Out of Food in the Last Year: Never true   Ran Out of Food in the Last Year: Never true  Housing: Medium Risk   Last Housing Risk Score: 1  Physical Activity: Not on file  Social Connections: Not on file  Stress: Not on file  Tobacco Use: High Risk   Smoking Tobacco Use: Every Day   Smokeless Tobacco Use: Never  Transportation Needs: Unmet Transportation  Needs   Lack of Transportation (Medical): Yes   Lack of Transportation (Non-Medical): Yes    SDOH Interventions: Food Insecurity:  Food Insecurity Interventions: Intervention Not Indicated  Housing Insecurity:  Housing Interventions: Other (Comment) (pt currently residing in [place] he uses for work)  Transport planner:   Transportation Interventions: [employer]     Other Care Navigation Interventions:     Provided Pharmacy assistance resources  Pt able to pick up medications, denies any current needs   Follow-up plan:   Pt enrollment securely sent to [employer] I sent pt a text with their number and a reminder to f/u with me as needed.

## 2021-01-09 NOTE — Telephone Encounter (Signed)
   JEN CADOTTE DOB: 17-Mar-1956 MRN: [id number]   RIDER WAIVER AND RELEASE OF LIABILITY  For purposes of improving physical access to our facilities, [facility] is pleased to partner with third parties to provide [facility] patients or other authorized individuals the option of convenient, on-demand ground transportation services (the Technical brewer") through use of the technology service that enables users to request on-demand ground transportation from independent third-party providers.  By opting to use and accept these [employer], I, the undersigned, hereby agree on behalf of myself, and on behalf of any minor child using the Government social research officer for whom I am the parent or legal guardian, as follows:  Government social research officer provided to me are provided by independent third-party transportation providers who are not [employer] or employees and who are unaffiliated with [employer]. [place] is neither a transportation carrier nor a common or public carrier. [facility] has no control over the quality or safety of the transportation that occurs as a result of the [employer]. [facility] cannot guarantee that any third-party transportation provider will complete any arranged transportation service. [facility] makes no representation, warranty, or guarantee regarding the reliability, timeliness, quality, safety, suitability, or availability of any of the Transport Services or that they will be error free. I fully understand that traveling by vehicle involves risks and dangers of serious bodily injury, including permanent disability, paralysis, and death. I agree, on behalf of myself and on behalf of any minor child using the Transport Services for whom I am the parent or legal guardian, that the entire risk arising out of my use of the [employer] remains solely with me, to the maximum extent permitted under applicable law. The [employer] are provided  "as is" and "as available." [facility] disclaims all representations and warranties, express, implied or statutory, not expressly set out in these terms, including the implied warranties of merchantability and fitness for a particular purpose. I hereby waive and release [place], its agents, employees, officers, directors, representatives, insurers, attorneys, assigns, successors, subsidiaries, and affiliates from any and all past, present, or future claims, demands, liabilities, actions, causes of action, or suits of any kind directly or indirectly arising from acceptance and use of the [employer]. I further waive and release [facility] and its affiliates from all present and future liability and responsibility for any injury or death to persons or damages to property caused by or related to the use of the [employer]. I have read this Waiver and Release of Liability, and I understand the terms used in it and their legal significance. This Waiver is freely and voluntarily given with the understanding that my right (as well as the right of any minor child for whom I am the parent or legal guardian using the [employer]) to legal recourse against [place] in connection with the [employer] is knowingly surrendered in return for use of these services.   I attest that I read the consent document to Charlotta Newton, gave Mr. Jardon the opportunity to ask questions and answered the questions asked (if any). I affirm that Charlotta Newton then provided consent for he's participation in this program.     Drucie Ip

## 2021-01-10 ENCOUNTER — Telehealth: Payer: Self-pay

## 2021-01-10 NOTE — Telephone Encounter (Signed)
Called patient to schedule a follow up following recent hospital admission. Patient declined follow up appt at this time.

## 2021-01-10 NOTE — Telephone Encounter (Signed)
Spoke with patient and he confirmed he would go tomorrow for repeat labs. Also advised that someone should reach out to him for assistance with transportation. He stated that has been done already. Instructed him to please call back if any further questions. He verbalized understanding with no further questions at this time.

## 2021-01-11 ENCOUNTER — Other Ambulatory Visit
Admission: RE | Admit: 2021-01-11 | Discharge: 2021-01-11 | Disposition: A | Discharge status: Home / Self Care | Payer: BC Managed Care – PPO | Attending: Physician Assistant | Admitting: Physician Assistant

## 2021-01-11 DIAGNOSIS — I2510 Atherosclerotic heart disease of native coronary artery without angina pectoris: Secondary | ICD-10-CM | POA: Diagnosis present

## 2021-01-11 DIAGNOSIS — I5020 Unspecified systolic (congestive) heart failure: Secondary | ICD-10-CM | POA: Diagnosis present

## 2021-01-11 DIAGNOSIS — I509 Heart failure, unspecified: Secondary | ICD-10-CM

## 2021-01-11 DIAGNOSIS — N179 Acute kidney failure, unspecified: Secondary | ICD-10-CM | POA: Diagnosis present

## 2021-01-11 LAB — BASIC METABOLIC PANEL WITH GFR
Anion gap: 12 (ref 5–15)
BUN: 16 mg/dL (ref 8–23)
CO2: 28 mmol/L (ref 22–32)
Calcium: 9.1 mg/dL (ref 8.9–10.3)
Chloride: 96 mmol/L — ABNORMAL LOW (ref 98–111)
Creatinine, Ser: 1.35 mg/dL — ABNORMAL HIGH (ref 0.61–1.24)
GFR, Estimated: 59 mL/min — ABNORMAL LOW
Glucose, Bld: 134 mg/dL — ABNORMAL HIGH (ref 70–99)
Potassium: 4.1 mmol/L (ref 3.5–5.1)
Sodium: 136 mmol/L (ref 135–145)

## 2021-01-17 ENCOUNTER — Ambulatory Visit: Payer: BC Managed Care – PPO | Admitting: Nurse Practitioner

## 2021-02-06 ENCOUNTER — Other Ambulatory Visit: Payer: Self-pay

## 2021-02-06 ENCOUNTER — Encounter: Payer: Self-pay | Admitting: Nurse Practitioner

## 2021-02-06 ENCOUNTER — Ambulatory Visit (INDEPENDENT_AMBULATORY_CARE_PROVIDER_SITE_OTHER): Payer: BC Managed Care – PPO | Admitting: Nurse Practitioner

## 2021-02-06 VITALS — BP 140/74 | HR 122 | Ht 74.0 in | Wt 245.0 lb

## 2021-02-06 DIAGNOSIS — I2510 Atherosclerotic heart disease of native coronary artery without angina pectoris: Secondary | ICD-10-CM

## 2021-02-06 DIAGNOSIS — N182 Chronic kidney disease, stage 2 (mild): Secondary | ICD-10-CM

## 2021-02-06 DIAGNOSIS — I5020 Unspecified systolic (congestive) heart failure: Secondary | ICD-10-CM | POA: Diagnosis not present

## 2021-02-06 DIAGNOSIS — I491 Atrial premature depolarization: Secondary | ICD-10-CM

## 2021-02-06 DIAGNOSIS — I10 Essential (primary) hypertension: Secondary | ICD-10-CM | POA: Diagnosis not present

## 2021-02-06 DIAGNOSIS — I255 Ischemic cardiomyopathy: Secondary | ICD-10-CM | POA: Diagnosis not present

## 2021-02-06 DIAGNOSIS — Z86718 Personal history of other venous thrombosis and embolism: Secondary | ICD-10-CM

## 2021-02-06 DIAGNOSIS — Z720 Tobacco use: Secondary | ICD-10-CM

## 2021-02-06 DIAGNOSIS — E785 Hyperlipidemia, unspecified: Secondary | ICD-10-CM

## 2021-02-06 MED ORDER — ISOSORBIDE DINITRATE 20 MG PO TABS: 20.0000 mg | ORAL_TABLET | Freq: Three times a day (TID) | ORAL | 1 refills | Status: DC

## 2021-02-06 MED ORDER — HYDRALAZINE HCL 25 MG PO TABS: 37.5000 mg | ORAL_TABLET | Freq: Three times a day (TID) | ORAL | 1 refills | Status: DC

## 2021-02-06 NOTE — Patient Instructions (Signed)
Medication Instructions:  - Your physician recommends that you continue on your current medications as directed. Please refer to the Current Medication list given to you today.  *If you need a refill on your cardiac medications before your next appointment, please call your pharmacy*   Lab Work: - Your physician recommends that you have lab work today: BMP  If you have labs (blood work) drawn today and your tests are completely normal, you will receive your results only by: MyChart Message (if you have MyChart) OR A paper copy in the mail If you have any lab test that is abnormal or we need to change your treatment, we will call you to review the results.   Testing/Procedures: - none ordered   Follow-Up: At [facility], you and your health needs are our priority.  As part of our continuing mission to provide you with exceptional heart care, we have created designated Provider Care Teams.  These Care Teams include your primary Cardiologist (physician) and Advanced Practice Providers (APPs -  Physician Assistants and Nurse Practitioners) who all work together to provide you with the care you need, when you need it.  We recommend signing up for the patient portal called "MyChart".  Sign up information is provided on this After Visit Summary.  MyChart is used to connect with patients for Virtual Visits (Telemedicine).  Patients are able to view lab/test results, encounter notes, upcoming appointments, etc.  Non-urgent messages can be sent to your provider as well.   To learn more about what you can do with MyChart, go to [email or web address].    Your next appointment:   4-6 week(s)  The format for your next appointment:   In Person  Provider:   You may see Kate Sable, MD or one of the following Advanced Practice Providers on your designated Care Team:   Murray Hodgkins, NP Christell Faith, PA-C    Other Instructions N/a

## 2021-02-06 NOTE — Progress Notes (Signed)
Office Visit    Patient Name: Frederick Perry Date of Encounter: 02/06/2021  Primary Care Provider:  Gladstone Lighter, MD Primary Cardiologist:  Kate Sable, MD  Chief Complaint    65 year old male with a history of CAD, HFrEF, ischemic cardiomyopathy, hypertension, diabetes, tobacco abuse, alcohol abuse, CKD II-III, bladder cancer and DVT/PE status post IVC filter in 2017 (on Eliquis since recurrent DVT in 2021), who presents for follow-up of heart failure and CAD.  Past Medical History    Past Medical History:  Diagnosis Date   Bladder cancer ([place])    a. 12/2015 s/p resection.   CKD (chronic kidney disease), stage II    Coronary artery disease    a. 2017 Cath: occ OM2, occ PDA (both being filled via collats), mod dzs in LAD->med rx; b. 12/2020 Cath: LM nl, LAD 70p, 84m- dLAD fills via R->L collats. RI 100 CTO - fills via collats from OM1. LCX 742m. OM2 100 CTO. RCA 7058m5d. RPDA 100 - fills via collats from OM3. EF 30-35%->med rx.   HFrEF (heart failure with reduced ejection fraction) ([facility]  a. 08/2015 Echo: EF 50-55%; b. 08/2019 Echo: EF 30-35%; c. 12/2020 Echo: EF 30-35%, GrII DD. Sev apical septal, ant, inf HK. Nl RV fxn. Mild MR. Triv AI. Mild-mod Ao sclerosis.   History of DVT (deep vein thrombosis)    a. 08/2015 - s/p IVC filter; b. 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   Hyperlipidemia LDL goal <70    Hypertension    Not on medications.stopped since he ran out of medications   IDA (iron deficiency anemia)    Ischemic cardiomyopathy    a. 08/2015 Echo: EF 50-55%; b. 08/2019 Echo: EF 30-35%; c. 12/2020 Echo: EF 30-35%.   [facility]  Perforated bowel ([facility]  a. 11/2015 s/p surgical repair.   Polysubstance abuse ([facility]  a. ongoing tobacco and alcohol abuse    Pulmonary embolism ([facility]  Type II diabetes mellitus ([facility]  Past Surgical History:  Procedure Laterality Date   BACK SURGERY     CARDIAC CATHETERIZATION N/A 09/05/2015    Procedure: Left Heart Cath and Coronary Angiography;  Surgeon: TimMinna MerrittsD;  [facility];  Service: Cardiovascular;  Laterality: N/A;   DRAIN REMOVAL Right 12/20/2015   Procedure: DRAIN REMOVAL;  Surgeon: BriNickie RetortD;  Location: [facility];  Service: Urology;  Laterality: Right;  by dr copper   LAPAROTOMY N/A 12/14/2015   Procedure: EXPLORATORY LAPAROTOMY, prepyloric gastric ulcer biopsy;  Surgeon: DieJules HusbandsD;  Location: [facility];  Service: General;  Laterality: N/A;   LEFT HEART CATH AND CORONARY ANGIOGRAPHY Left 01/07/2021   Procedure: LEFT HEART CATH AND CORONARY ANGIOGRAPHY;  Surgeon: EndNelva BushD;  [facility];  Service: Cardiovascular;  Laterality: Left;   Patellar tendon repair Right    PERIPHERAL VASCULAR CATHETERIZATION N/A 09/07/2015   Procedure: IVC Filter Insertion;  Surgeon: JasAlgernon HuxleyD;  [facility];  Service: Cardiovascular;  Laterality: N/A;   Testicular torsion repair     TRANSURETHRAL RESECTION OF BLADDER TUMOR N/A 12/20/2015   Procedure: TRANSURETHRAL RESECTION OF BLADDER TUMOR (TURBT);  Surgeon: BriNickie RetortD;  Location: [facility];  Service: Urology;  Laterality: N/A;   TRANSURETHRAL RESECTION OF BLADDER TUMOR N/A 03/26/2016   Procedure: TRANSURETHRAL RESECTION OF BLADDER TUMOR (TURBT);  Surgeon: BriNickie RetortD;  Location:  [facility];  Service: Urology;  Laterality: N/A;    Allergies  No Known Allergies  History of Present Illness    65 year old male with above complex past medical history including CAD, HFrEF, ischemic cardiomyopathy, hypertension, hyperlipidemia, tobacco and alcohol abuse, CKD II-III, and prior DVT/PE status post IVC filter in 2017.  He has been on Eliquis since recurrent DVT and 2021.  Cardiac history dates back to 2017, when he was evaluated for chest pain and found to have a hemoglobin of 4.9.  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  He required admission and multiple transfusions.  His troponin was elevated in the setting of anemia.  He was also found to have a right superficial femoral to common femoral DVT with bilateral distal branch pulmonary emboli.  An IVC filter was placed.  Echo showed EF of 50 to 55%.  Diagnostic catheterization revealed moderate LAD disease, and occluded proximal OM2, and an occluded RPDA with collateral filling.  Medical therapy was advised and the patient was subsequently lost to follow-up until January 2021, when he presented with worsening lower extremity swelling.  He was found to have right lower extremity DVT and was placed on Eliquis.  Subsequent echocardiogram in March 2021 showed an EF of 30 to 35%, and it was felt he would need repeat right left heart cardiac catheterization.  Patient was again lost to follow-up until earlier this summer, when he was seen in the emergency department with palpitations and found to have frequent PACs.  His troponin was elevated at 131 and he was advised to resume his beta-blocker, which she had stopped.  He was seen back in clinic July 22 and arrangements were made for right left heart cardiac catheterization which was performed July 25 revealing severe multivessel coronary artery disease including a 99% mid to distal LAD stenosis which fills via right to left collaterals, occluded ramus intermedius (filled via collaterals from OM1), moderate mid to distal circumflex disease, total occlusion of the OM 2, and severe diffuse disease throughout the RCA with chronic total occlusion of the RPDA, which filled via collaterals from the OM 3.  EF was 30 to 35%.  There were no targets for intervention and continued medical therapy was recommended.  He was noted to have runs of PSVT and nonsustained VT (versus SVT with aberrancy), which were asymptomatic.  Eliquis was restarted prior to discharge.  Since discharge, he notes that he has been feeling well.  He is not  routinely weighing himself.  He denies chest pain or dyspnea.  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  He denies palpitations, PND, orthopnea, dizziness, syncope, edema, or early satiety.  His heart rate is elevated today at 122 bpm with frequent PACs and occasional PVCs.  He notes that he ran out of his carvedilol and has not taken it in a day or 2.  He will pick up a refill today.  Home Medications    Current Outpatient Medications  Medication Sig Dispense Refill   apixaban (ELIQUIS) 5 MG TABS tablet Take 1 tablet (5 mg) by mouth twice daily      aspirin EC 81 MG tablet Take 81 mg by mouth daily.     atorvastatin (LIPITOR) 40 MG tablet Take 1 tablet (40 mg total) by mouth daily. 90 tablet 3   carvedilol (COREG) 6.25 MG tablet Take 1 tablet (6.25 mg total) by mouth 2 (two) times daily. [place]  tablet 0   cetirizine (ZYRTEC) 10 MG tablet Take 10 mg by mouth daily as needed for allergies.     hydrALAZINE (APRESOLINE) 25 MG tablet Take 1.5 tablets (37.5 mg total) by mouth 3 (three) times daily. 135 tablet 0   isosorbide dinitrate (ISORDIL) 20 MG tablet Take 1 tablet (20 mg total) by mouth 3 (three) times daily. 90 tablet 0   nicotine (NICODERM CQ - DOSED IN MG/24 HOURS) 21 mg/24hr patch One 21 patch chest wall daily (okay to substitute generic) 28 patch 0   torsemide (DEMADEX) 20 MG tablet Take 1 tablet (20 mg total) by mouth daily. 30 tablet 0   No current facility-administered medications for this visit.     Review of Systems    He has chronic mild ankle edema.  This is dependent in nature and resolves overnight.  He denies chest pain, palpitations, dyspnea, pnd, orthopnea, n, v, dizziness, syncope, weight gain, or early satiety.  All other systems reviewed and are otherwise negative except as noted above.  Physical Exam    VS:  BP 140/74 (BP Location: Left Arm, Patient Position: Sitting, Cuff Size: Large)   Pulse (!) 122   Ht '6\' 2"'$  (1.88 m)   Wt 245  lb (111.1 kg)   SpO2 96%   BMI 31.46 kg/m  , BMI Body mass index is 31.46 kg/m.     GEN: Well nourished, well developed, in no acute distress. HEENT: normal. Neck: Supple, no JVD, carotid bruits, or masses. Cardiac: RRR, no murmurs, rubs, or gallops. No clubbing, cyanosis, 1+ bilateral ankle edema.  Radials/PT 2+ and equal bilaterally.  Respiratory:  Respirations regular and unlabored, clear to auscultation bilaterally. GI: Soft, nontender, nondistended, BS + x 4. MS: no deformity or atrophy. Skin: warm and dry, no rash. Neuro:  Strength and sensation are intact. Psych: Normal affect.  Accessory Clinical Findings    ECG personally reviewed by me today -sinus tachycardia, 122, frequent PACs.  Baseline artifact.  Inferior and anterolateral infarcts - no acute changes.  Rhythm strip performed and confirms sinus rhythm/sinus tachycardia with frequent PACs and occasional PVCs.  Lab Results  Component Value Date   WBC 5.4 01/04/2021   HGB 14.8 01/04/2021   HCT 43.3 01/04/2021   MCV 101.2 (H) 01/04/2021   PLT 178 01/04/2021   Lab Results  Component Value Date   CREATININE 1.35 (H) 01/11/2021   BUN 16 01/11/2021   NA 136 01/11/2021   K 4.1 01/11/2021   CL 96 (L) 01/11/2021   CO2 28 01/11/2021   Lab Results  Component Value Date   ALT 16 02/04/2018   AST 21 02/04/2018   ALKPHOS 76 02/04/2018   BILITOT 1.2 02/04/2018   Lab Results  Component Value Date   CHOL 189 07/21/2019   HDL 55 07/21/2019   LDLCALC 118 (H) 07/21/2019   TRIG 82 07/21/2019   CHOLHDL 3.4 07/21/2019    Lab Results  Component Value Date   HGBA1C 5.4 01/04/2021    Assessment & Plan    1.  Chronic heart failure with reduced ejection fraction/ischemic cardiomyopathy: Patient with a history of LV dysfunction dating back at least to March 2021, at which time EF was 30 to 35%.  He deferred further ischemic evaluation until recently.  Most recent echo in July again shows an EF of 30 to 35%, grade 2  diastolic dysfunction, and severe apical septal, anterior, and inferior hypokinesis.  Recent diagnostic catheterization with severe multivessel coronary artery disease and collateral flow,  with recommendation for medical management.  He denies chest pain or dyspnea.  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  His weight has been stable per our scale and with the exception of lower extremity edema, he appears euvolemic on exam.  He has been off of carvedilol for a day or more and in that setting, he is tachycardic with frequent PACs and occasional PVCs.  He has to go to the pharmacy today to pick up his carvedilol.  I encouraged him to do so.  I will follow-up a basic metabolic panel today and if renal function is stable, will likely plan to initiate Entresto therapy.  He remains on beta-blocker, hydralazine, and nitrate.  He is also taking torsemide 20 mg daily.  He will require close follow-up for titration of medications in future addition of spironolactone and SGLT2 inhibitor if tolerated.  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  2.  Coronary artery disease: Status post recent catheterization revealing significant multivessel disease with right to left and left to right collaterals.  No targets for intervention.  He has not been having any chest pain and denies dyspnea on exertion.  He remains on aspirin, statin, beta-blocker, and nitrate therapy.  3.  Essential hypertension: Blood pressure elevated today though he notes that he has been out of carvedilol.  He has to pick this up at the pharmacy today.  Follow-up basic metabolic panel today and if renal function stable, will look to add Entresto.  4.  Hyperlipidemia: His LDL was 118 back in February 2021.  He is now on statin and notes compliance.  He is not fasting today.  We will need to follow this up at a future appointment.  5.  Stage II-III chronic kidney disease:  Follow-up basic metabolic panel today.  6.  Tobacco abuse: Smoking roughly 1 pack/day.  Complete cessation advised.  7.  Alcohol abuse: Patient says he is not drinking.  8.  History of DVT/PE: On chronic Eliquis.  9.  History of anemia: H&H stable in July.  10.  Diet-controlled diabetes: A1c was 5.4 in July.  11.  Premature atrial contractions with tachycardia: In the setting of noncompliance with beta-blocker.  Asymptomatic.  He will carvedilol today.  12.  Disposition: Follow-up basic metabolic panel today.  Follow-up in clinic in 4 to 6 weeks or sooner if necessary.  Murray Hodgkins, NP 02/06/2021, 10:53 AM

## 2021-02-07 LAB — BASIC METABOLIC PANEL
BUN/Creatinine Ratio: 10 (ref 10–24)
BUN: 12 mg/dL (ref 8–27)
CO2: 25 mmol/L (ref 20–29)
Calcium: 9.2 mg/dL (ref 8.6–10.2)
Chloride: 96 mmol/L (ref 96–106)
Creatinine, Ser: 1.18 mg/dL (ref 0.76–1.27)
Glucose: 125 mg/dL — ABNORMAL HIGH (ref 65–99)
Potassium: 3.4 mmol/L — ABNORMAL LOW (ref 3.5–5.2)
Sodium: 139 mmol/L (ref 134–144)
eGFR: 69 mL/min/{1.73_m2} (ref >59)

## 2021-02-11 ENCOUNTER — Telehealth: Payer: Self-pay | Admitting: *Deleted

## 2021-02-11 NOTE — Telephone Encounter (Signed)
-----   Message from Theora Gianotti, NP sent at 02/07/2021  7:19 AM EDT ----- Kidney function is normal.  His potassium is low @ 3.4.  In order to improve his heart failure med regiment, I'd like to add spironolactone '25mg'$  daily.  This may help his potassium as well.  F/u bmet in 1-2 wks.

## 2021-02-11 NOTE — Telephone Encounter (Signed)
Spoke to pt, notified of lab results and provider's recc.  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  Pt unable to pick up Rx at this time until he returns home (~02/25/21). Pt asks that we do not send Rx until closer to his return d/t [facility] and has to call to have resent.  Notified pt I will make Ignacia Bayley, NP nurse aware re sending Rx later time and arranging repeat BMET.

## 2021-02-14 ENCOUNTER — Ambulatory Visit: Payer: BC Managed Care – PPO | Admitting: Nurse Practitioner

## 2021-03-04 NOTE — Telephone Encounter (Signed)
Left voicemail message (03/04/21) to call back for review.   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 This was reviewed with him at last appointment. Patient requested that he wanted [place] to wait to order medication and labs for when he returns. In previous call I did remind him that after starting medication we then wanted those repeat labs to see how they look after medication. Provided our number to call prior to him getting back in town.

## 2021-03-04 NOTE — Telephone Encounter (Signed)
Patient returning call.

## 2021-03-04 NOTE — Telephone Encounter (Signed)
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 He inquired about this in more detail and reviewed provider documentation listed below. He then states that he does not want to start medication or have labs done due to being out of state. He does have upcoming appointment with Dr. Garen Lah on 03/18/2021 at 08:20 am and states he is going to keep that and can discuss any changes needed at that time. 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

## 2021-03-15 NOTE — Telephone Encounter (Signed)
Spoke with patient and reviewed that once he moves and determines which pharmacy he is going to use then they can call and get those prescriptions transferred for him. He verbalized understanding and was agreeable with this plan because he reports sending it to wrong pharmacy before. No further needs at this time.

## 2021-03-15 NOTE — Telephone Encounter (Signed)
Frederick Perry is calling about the medication he called in regards to. He states he is in [place] and would like a callback to setup a transfer to the pharmacy he is located at so he can pick it up. Please advise.

## 2021-03-18 ENCOUNTER — Ambulatory Visit: Payer: BC Managed Care – PPO | Admitting: Cardiology

## 2021-04-03 ENCOUNTER — Other Ambulatory Visit: Payer: Self-pay | Admitting: Internal Medicine

## 2021-04-03 MED ORDER — CARVEDILOL 6.25 MG PO TABS: 6.2500 mg | ORAL_TABLET | Freq: Two times a day (BID) | ORAL | 0 refills | Status: DC

## 2021-04-03 NOTE — Telephone Encounter (Signed)
Please reschedule F/U appointment for refills. Patient cancelled last schedule office visit. Thank you!

## 2021-04-03 NOTE — Telephone Encounter (Signed)
Patient states he is out of medication needs refill. Appt is scheduled for 04/22/21

## 2021-04-03 NOTE — Telephone Encounter (Signed)
*  STAT* If patient is at the pharmacy, call can be transferred to refill team.   1. Which medications need to be refilled? (please list name of each medication and dose if known) carvedilol (COREG) 6.25 MG 1 tablet 2 times daily   2. Which pharmacy/location (including street and city if local pharmacy) is medication to [facility] sent to[facility] [place]   3. Do they need a 30 day or 90 day supply? 90 day

## 2021-04-03 NOTE — Telephone Encounter (Signed)
Attempted to reschedule.  

## 2021-04-03 NOTE — Telephone Encounter (Signed)
PATIENT calling to check status of refill after scheduling a fu for November

## 2021-04-04 ENCOUNTER — Other Ambulatory Visit: Payer: Self-pay

## 2021-04-04 MED ORDER — APIXABAN 5 MG PO TABS: 5.0000 mg | ORAL_TABLET | Freq: Two times a day (BID) | ORAL | 5 refills | Status: DC

## 2021-04-04 NOTE — Telephone Encounter (Signed)
Prescription refill request for Eliquis received. Indication: DVT/PE Last office visit: 02/06/21 C Burge PA-C Scr: 1.18 on 02/06/21 Age: 65 Weight: 111.1kg  Based on above findings Eliquis 5mg  twice daily is the appropriate dose.  Refill approved.

## 2021-04-04 NOTE — Telephone Encounter (Signed)
*  STAT* If patient is at the pharmacy, call can be transferred to refill team.   1. Which medications need to be refilled? (please list name of each medication and dose if known) Eliquis  2. Which pharmacy/location (including street and city if local pharmacy) is medication to be sent to? [facility] [place]  3. Do they need a 30 day or 90 day supply? [place]

## 2021-04-04 NOTE — Telephone Encounter (Signed)
Rx sent to pharmacy yesterday. Further refills to be authorized at upcoming appointment in November. Patient informed.

## 2021-04-20 ENCOUNTER — Other Ambulatory Visit: Payer: Self-pay | Admitting: Nurse Practitioner

## 2021-04-22 ENCOUNTER — Other Ambulatory Visit: Payer: Self-pay

## 2021-04-22 ENCOUNTER — Ambulatory Visit: Payer: BC Managed Care – PPO | Admitting: Medical

## 2021-04-22 NOTE — Telephone Encounter (Signed)
Please contact pt for future appointment. Pt due for 4-6 wk f/u.

## 2021-04-22 NOTE — Telephone Encounter (Signed)
Appt for today cancelled . Will need fu  .

## 2021-05-08 ENCOUNTER — Other Ambulatory Visit: Payer: Self-pay | Admitting: *Deleted

## 2021-05-08 MED ORDER — CARVEDILOL 6.25 MG PO TABS: 6.2500 mg | ORAL_TABLET | Freq: Two times a day (BID) | ORAL | 0 refills | Status: DC

## 2021-05-22 ENCOUNTER — Other Ambulatory Visit: Payer: Self-pay | Admitting: *Deleted

## 2021-05-23 MED ORDER — HYDRALAZINE HCL 25 MG PO TABS: 37.5000 mg | ORAL_TABLET | Freq: Three times a day (TID) | ORAL | 0 refills | Status: DC

## 2021-05-23 NOTE — Telephone Encounter (Signed)
Patient not at home - unable to schedule at this time

## 2021-06-11 ENCOUNTER — Other Ambulatory Visit: Payer: Self-pay

## 2021-06-11 MED ORDER — CARVEDILOL 6.25 MG PO TABS: 6.2500 mg | ORAL_TABLET | Freq: Two times a day (BID) | ORAL | 0 refills | Status: DC

## 2021-07-30 ENCOUNTER — Other Ambulatory Visit: Payer: Self-pay

## 2021-08-02 ENCOUNTER — Telehealth: Payer: Self-pay | Admitting: Pharmacy Technician

## 2021-08-02 NOTE — Telephone Encounter (Signed)
Patient failed to provide 2023 proof of income.  No additional medication assistance will be provided by [facility] without the required proof of income documentation.  Patient notified by letter.  [facility]    [address], [place]  [id number]  July 19, 2021    Dear Frederick Perry:  This is to inform you that you are no longer eligible to receive medication assistance at [facility].  The reason(s) are:    _____Your total gross monthly household income exceeds 300% of the Federal Poverty Level.   _____Tangible assets (savings, checking, stocks/bonds, pension, retirement, etc.) exceeds our limit  _____You are eligible to receive benefits from [facility], [facility] or HIV Medication              Assistance Program _____You are eligible to receive benefits from a Medicare Part D plan _____You have prescription insurance  _____You are not an [facility] resident __X__Failure to provide all requested documentation (proof of income for 2023, and/or Patient Intake Application, DOH Attestation, Contract, etc).    Medication assistance will resume once all requested documentation has been returned to our clinic.  If you have questions, please contact our clinic at [phone].    Thank you,  [facility]

## 2021-10-08 NOTE — Telephone Encounter (Signed)
Attempted to schedule.  LMOV to call office.  ° °

## 2021-10-14 NOTE — Telephone Encounter (Signed)
Attempted to schedule. Lmov  

## 2022-01-21 NOTE — Progress Notes (Unsigned)
Cardiology Office Note:    Date:  01/21/2022   ID:  Charlotta Newton, DOB 1955/12/16, MRN [id number]  PCP:  Gladstone Lighter, MD  [facility] Cardiologist:  Kate Sable, MD  [facility] Electrophysiologist:  None   Referring MD: Gladstone Lighter, MD   Chief Complaint: 1 year follow-up  History of Present Illness:    Frederick Perry is a 66 y.o. male with a hx of CAD, HFrEF, ischemic cardiomyopathy, hypertension, diabetes, tobacco abuse, alcohol abuse, CKD II-III, bladder cancer and DVT/PE status post IVC filter in 2017 (on Eliquis since recurrent DVT in 2021), who presents for follow-up of heart failure and CAD.  Past Medical History:  Diagnosis Date   Bladder cancer ([place])    a. 12/2015 s/p resection.   CKD (chronic kidney disease), stage II    Coronary artery disease    a. 2017 Cath: occ OM2, occ PDA (both being filled via collats), mod dzs in LAD->med rx; b. 12/2020 Cath: LM nl, LAD 70p, 60m- dLAD fills via R->L collats. RI 100 CTO - fills via collats from OM1. LCX 715m. OM2 100 CTO. RCA 7054m5d. RPDA 100 - fills via collats from OM3. EF 30-35%->med rx.   HFrEF (heart failure with reduced ejection fraction) ([place]  a. 08/2015 Echo: EF 50-55%; b. 08/2019 Echo: EF 30-35%; c. 12/2020 Echo: EF 30-35%, GrII DD. Sev apical septal, ant, inf HK. Nl RV fxn. Mild MR. Triv AI. Mild-mod Ao sclerosis.   History of DVT (deep vein thrombosis)    a. 08/2015 - s/p IVC filter; b. 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   Hyperlipidemia LDL goal <70    Hypertension    Not on medications.stopped since he ran out of medications   IDA (iron deficiency anemia)    Ischemic cardiomyopathy    a. 08/2015 Echo: EF 50-55%; b. 08/2019 Echo: EF 30-35%; c. 12/2020 Echo: EF 30-35%.   [facility]  Perforated bowel ([place]  a. 11/2015 s/p surgical repair.   Polysubstance abuse ([place]  a. ongoing tobacco and alcohol abuse    Pulmonary embolism ([place]  Type II diabetes  mellitus ([place]   Past Surgical History:  Procedure Laterality Date   BACK SURGERY     CARDIAC CATHETERIZATION N/A 09/05/2015   Procedure: Left Heart Cath and Coronary Angiography;  Surgeon: TimMinna MerrittsD;  [facility];  Service: Cardiovascular;  Laterality: N/A;   DRAIN REMOVAL Right 12/20/2015   Procedure: DRAIN REMOVAL;  Surgeon: BriNickie RetortD;  Location: [facility];  Service: Urology;  Laterality: Right;  by dr copper   LAPAROTOMY N/A 12/14/2015   Procedure: EXPLORATORY LAPAROTOMY, prepyloric gastric ulcer biopsy;  Surgeon: DieJules HusbandsD;  Location: [facility];  Service: General;  Laterality: N/A;   LEFT HEART CATH AND CORONARY ANGIOGRAPHY Left 01/07/2021   Procedure: LEFT HEART CATH AND CORONARY ANGIOGRAPHY;  Surgeon: EndNelva BushD;  [facility];  Service: Cardiovascular;  Laterality: Left;   Patellar tendon repair Right    PERIPHERAL VASCULAR CATHETERIZATION N/A 09/07/2015   Procedure: IVC Filter Insertion;  Surgeon: JasAlgernon HuxleyD;  [facility];  Service: Cardiovascular;  Laterality: N/A;   Testicular torsion repair     TRANSURETHRAL RESECTION OF BLADDER TUMOR N/A 12/20/2015   Procedure: TRANSURETHRAL RESECTION OF BLADDER TUMOR (TURBT);  Surgeon: BriNickie RetortD;  Location: [facility];  Service: Urology;  Laterality:  N/A;   TRANSURETHRAL RESECTION OF BLADDER TUMOR N/A 03/26/2016   Procedure: TRANSURETHRAL RESECTION OF BLADDER TUMOR (TURBT);  Surgeon: Nickie Retort, MD;  Location: [facility];  Service: Urology;  Laterality: N/A;    Current Medications: No outpatient medications have been marked as taking for the 01/22/22 encounter (Appointment) with Kathlen Mody, Arek Spadafore H, PA-C.     Allergies:   Patient has no known allergies.   Social History   Socioeconomic History   Marital status: Divorced    Spouse name: Not on file   Number of children: Not on file   Years of education: Not on file   Highest  education level: Not on file  Occupational History   Not on file  Tobacco Use   Smoking status: Every Day    Packs/day: 0.50    Years: 40.00    Total pack years: 20.00    Types: Cigarettes   Smokeless tobacco: Never  Vaping Use   Vaping Use: Never used  Substance and Sexual Activity   Alcohol use: Yes    Alcohol/week: 10.0 standard drinks of alcohol    Types: 10 Cans of beer per week    Comment: weekly- beers and occasionally liquor   Drug use: No   Sexual activity: Not on file  Other Topics Concern   Not on file  Social History Narrative   Lives at home with Mother and is his Mothers primary caregiver   Social Determinants of Health   Financial Resource Strain: Medium Risk (01/09/2021)   Overall Financial Resource Strain (CARDIA)    Difficulty of Paying Living Expenses: Somewhat hard  Food Insecurity: No Food Insecurity (01/09/2021)   Hunger Vital Sign    Worried About Running Out of Food in the Last Year: Never true    [place] in the Last Year: Never true  Transportation Needs: Unmet Transportation Needs (01/09/2021)   PRAPARE - Transportation    Lack of Transportation (Medical): Yes    Lack of Transportation (Non-Medical): Yes  Physical Activity: Unknown (02/04/2018)   Exercise Vital Sign    Days of Exercise per Week: Patient refused    Minutes of Exercise per Session: Patient refused  Stress: Not on file  Social Connections: Unknown (02/04/2018)   Social Connection and Isolation Panel [NHANES]    Frequency of Communication with Friends and Family: Patient refused    Frequency of Social Gatherings with Friends and Family: Patient refused    Attends Religious Services: Patient refused    Marine scientist or Organizations: Patient refused    Attends Archivist Meetings: Patient refused    Marital Status: Patient refused     Family History: The patient's ***family history includes Congestive Heart Failure in his mother; Peripheral vascular  disease in his father.  ROS:   Please see the history of present illness.    *** All other systems reviewed and are negative.  EKGs/Labs/Other Studies Reviewed:    The following studies were reviewed today: ***  EKG:  EKG is *** ordered today.  The ekg ordered today demonstrates ***  Recent Labs: 02/06/2021: BUN 12; Creatinine, Ser 1.18; Potassium 3.4; Sodium 139  Recent Lipid Panel    Component Value Date/Time   CHOL 189 07/21/2019 0928   CHOL 179 02/04/2018 1944   TRIG 82 07/21/2019 0928   HDL 55 07/21/2019 0928   HDL 48 02/04/2018 1944   CHOLHDL 3.4 07/21/2019 0928   VLDL 16 07/21/2019 0928   LDLCALC 118 (H) 07/21/2019 7989  LDLCALC 112 (H) 02/04/2018 1944     Risk Assessment/Calculations:   {Does this patient have ATRIAL FIBRILLATION?:[phone]}   Physical Exam:    VS:  There were no vitals taken for this visit.    Wt Readings from Last 3 Encounters:  02/06/21 245 lb (111.1 kg)  01/08/21 244 lb 1.6 oz (110.7 kg)  01/04/21 252 lb (114.3 kg)     GEN: *** Well nourished, well developed in no acute distress HEENT: Normal NECK: No JVD; No carotid bruits LYMPHATICS: No lymphadenopathy CARDIAC: ***RRR, no murmurs, rubs, gallops RESPIRATORY:  Clear to auscultation without rales, wheezing or rhonchi  ABDOMEN: Soft, non-tender, non-distended MUSCULOSKELETAL:  No edema; No deformity  SKIN: Warm and dry NEUROLOGIC:  Alert and oriented x 3 PSYCHIATRIC:  Normal affect   ASSESSMENT:    No diagnosis found. PLAN:    In order of problems listed above:  ***  Disposition: Follow up {follow up:15908} with ***   Shared Decision Making/Informed Consent   {Are you ordering a CV Procedure (e.g. stress test, cath, DCCV, TEE, etc)?   Press F2        :[id number]}    Signed, Fahad Cisse Ninfa Meeker, PA-C  01/21/2022 9:18 PM    [facility]

## 2022-01-22 ENCOUNTER — Encounter: Payer: Self-pay | Admitting: Medical

## 2022-01-22 ENCOUNTER — Ambulatory Visit (INDEPENDENT_AMBULATORY_CARE_PROVIDER_SITE_OTHER): Payer: BC Managed Care – PPO | Admitting: Medical

## 2022-01-22 VITALS — BP 120/70 | HR 67 | Ht 74.0 in | Wt 250.2 lb

## 2022-01-22 DIAGNOSIS — I428 Other cardiomyopathies: Secondary | ICD-10-CM | POA: Diagnosis not present

## 2022-01-22 DIAGNOSIS — I2510 Atherosclerotic heart disease of native coronary artery without angina pectoris: Secondary | ICD-10-CM | POA: Diagnosis not present

## 2022-01-22 DIAGNOSIS — Z86718 Personal history of other venous thrombosis and embolism: Secondary | ICD-10-CM

## 2022-01-22 DIAGNOSIS — N182 Chronic kidney disease, stage 2 (mild): Secondary | ICD-10-CM

## 2022-01-22 DIAGNOSIS — I5020 Unspecified systolic (congestive) heart failure: Secondary | ICD-10-CM

## 2022-01-22 DIAGNOSIS — I10 Essential (primary) hypertension: Secondary | ICD-10-CM | POA: Diagnosis not present

## 2022-01-22 DIAGNOSIS — E782 Mixed hyperlipidemia: Secondary | ICD-10-CM

## 2022-01-22 DIAGNOSIS — Z720 Tobacco use: Secondary | ICD-10-CM

## 2022-01-22 NOTE — Patient Instructions (Signed)
Medication Instructions:   Your physician recommends that you continue on your current medications as directed. Please refer to the Current Medication list given to you today.  *If you need a refill on your cardiac medications before your next appointment, please call your pharmacy*   Lab Work: None ordered  If you have labs (blood work) drawn today and your tests are completely normal, you will receive your results only by: [place] (if you have MyChart) OR A paper copy in the mail If you have any lab test that is abnormal or we need to change your treatment, we will call you to review the results.   Testing/Procedures:  Your physician has requested that you have an echocardiogram. Echocardiography is a painless test that uses sound waves to create images of your heart. It provides your doctor with information about the size and shape of your heart and how well your heart's chambers and valves are working. This procedure takes approximately one hour. There are no restrictions for this procedure.    Follow-Up: At [facility], you and your health needs are our priority.  As part of our continuing mission to provide you with exceptional heart care, we have created designated Provider Care Teams.  These Care Teams include your primary Cardiologist (physician) and Advanced Practice Providers (APPs -  Physician Assistants and Nurse Practitioners) who all work together to provide you with the care you need, when you need it.  We recommend signing up for the patient portal called "MyChart".  Sign up information is provided on this After Visit Summary.  MyChart is used to connect with patients for Virtual Visits (Telemedicine).  Patients are able to view lab/test results, encounter notes, upcoming appointments, etc.  Non-urgent messages can be sent to your provider as well.   To learn more about what you can do with MyChart, go to [email or web address].    Your next appointment:   1  year(s)  The format for your next appointment:   In Person  Provider:   You may see Kate Sable, MD or one of the following Advanced Practice Providers on your designated Care Team:   Murray Hodgkins, NP Christell Faith, PA-C Cadence Kathlen Mody, [place]    Important Information About [place]

## 2022-02-25 ENCOUNTER — Ambulatory Visit: Payer: BC Managed Care – PPO

## 2022-11-13 ENCOUNTER — Encounter: Payer: Self-pay | Admitting: Medical

## 2022-12-08 ENCOUNTER — Telehealth (HOSPITAL_BASED_OUTPATIENT_CLINIC_OR_DEPARTMENT_OTHER): Payer: Self-pay | Admitting: Licensed Clinical Social Worker

## 2022-12-08 NOTE — Telephone Encounter (Signed)
H&V Care Navigation CSW Progress Note  Clinical Social Worker contacted patient by phone to f/u on missed call from this weekend. Have not assisted pt since 2022. He had left a lengthy voicemail this weekend but think it may have been unintentional as just a bunch of background tv noise. Was able to reach him this morning at [phone]. Re-introduced self, role, reason for call. He confirms he has a new phone and was setting it up. I noted that he has turned 66, no Medicare on file. Inquired if he had enrolled, he states he has not. Discussed contacting local [employer] program to speak with a counselor to enroll in benefits. LCSW texted him that number for [facility]. Encouraged him to let our team know if there is anything else we can do for him. LCSW notes multiple medications expired, will route to refill team.   Patient is participating in a Managed Medicaid Plan:  No, BCBS commercial plan  SDOH Screenings   Food Insecurity: No Food Insecurity (01/09/2021)  Housing: Medium Risk (01/09/2021)  Transportation Needs: Unmet Transportation Needs (01/09/2021)  Financial Resource Strain: Medium Risk (01/09/2021)  Physical Activity: Unknown (02/04/2018)  Social Connections: Unknown (02/04/2018)  Tobacco Use: High Risk (01/22/2022)   Octavio Graves, MSW, LCSW Clinical Social Worker II [facility] Heart/Vascular Care Navigation  [phone]- work cell phone (preferred) [phone]- desk phone

## 2022-12-09 ENCOUNTER — Other Ambulatory Visit: Payer: Self-pay

## 2022-12-09 MED ORDER — CARVEDILOL 6.25 MG PO TABS: 6.2500 mg | ORAL_TABLET | Freq: Two times a day (BID) | ORAL | 0 refills | Status: DC

## 2022-12-09 MED ORDER — ATORVASTATIN CALCIUM 40 MG PO TABS: 40.0000 mg | ORAL_TABLET | Freq: Every day | ORAL | 3 refills | Status: DC

## 2022-12-09 MED ORDER — HYDRALAZINE HCL 25 MG PO TABS: 37.5000 mg | ORAL_TABLET | Freq: Three times a day (TID) | ORAL | 0 refills | Status: DC

## 2022-12-09 NOTE — Telephone Encounter (Signed)
Refills sent for all expired cardiac medications.

## 2022-12-22 ENCOUNTER — Emergency Department: Payer: Medicare Other

## 2022-12-22 ENCOUNTER — Inpatient Hospital Stay (HOSPITAL_COMMUNITY)
Admit: 2022-12-22 | Discharge: 2022-12-22 | Disposition: A | Discharge status: Home / Self Care | Payer: Medicare Other | Attending: Internal Medicine | Admitting: Internal Medicine

## 2022-12-22 ENCOUNTER — Inpatient Hospital Stay
Admission: EM | Admit: 2022-12-22 | Discharge: 2023-01-02 | DRG: 291 | Disposition: A | Discharge status: Home / Self Care | Payer: Medicare Other | Source: Ambulatory Visit | Attending: Hospitalist | Admitting: Hospitalist

## 2022-12-22 ENCOUNTER — Other Ambulatory Visit: Payer: Self-pay

## 2022-12-22 DIAGNOSIS — E669 Obesity, unspecified: Secondary | ICD-10-CM | POA: Diagnosis present

## 2022-12-22 DIAGNOSIS — I2510 Atherosclerotic heart disease of native coronary artery without angina pectoris: Secondary | ICD-10-CM | POA: Diagnosis present

## 2022-12-22 DIAGNOSIS — E785 Hyperlipidemia, unspecified: Secondary | ICD-10-CM | POA: Diagnosis present

## 2022-12-22 DIAGNOSIS — F17210 Nicotine dependence, cigarettes, uncomplicated: Secondary | ICD-10-CM | POA: Diagnosis present

## 2022-12-22 DIAGNOSIS — I5023 Acute on chronic systolic (congestive) heart failure: Secondary | ICD-10-CM | POA: Diagnosis present

## 2022-12-22 DIAGNOSIS — R0609 Other forms of dyspnea: Secondary | ICD-10-CM | POA: Diagnosis not present

## 2022-12-22 DIAGNOSIS — R319 Hematuria, unspecified: Secondary | ICD-10-CM | POA: Diagnosis not present

## 2022-12-22 DIAGNOSIS — Z5986 Financial insecurity: Secondary | ICD-10-CM

## 2022-12-22 DIAGNOSIS — N182 Chronic kidney disease, stage 2 (mild): Secondary | ICD-10-CM | POA: Diagnosis present

## 2022-12-22 DIAGNOSIS — E1122 Type 2 diabetes mellitus with diabetic chronic kidney disease: Secondary | ICD-10-CM | POA: Diagnosis present

## 2022-12-22 DIAGNOSIS — Z8249 Family history of ischemic heart disease and other diseases of the circulatory system: Secondary | ICD-10-CM

## 2022-12-22 DIAGNOSIS — E873 Alkalosis: Secondary | ICD-10-CM | POA: Diagnosis not present

## 2022-12-22 DIAGNOSIS — Z7982 Long term (current) use of aspirin: Secondary | ICD-10-CM

## 2022-12-22 DIAGNOSIS — D509 Iron deficiency anemia, unspecified: Secondary | ICD-10-CM | POA: Diagnosis present

## 2022-12-22 DIAGNOSIS — I130 Hypertensive heart and chronic kidney disease with heart failure and stage 1 through stage 4 chronic kidney disease, or unspecified chronic kidney disease: Secondary | ICD-10-CM | POA: Diagnosis present

## 2022-12-22 DIAGNOSIS — F1010 Alcohol abuse, uncomplicated: Secondary | ICD-10-CM | POA: Diagnosis present

## 2022-12-22 DIAGNOSIS — I341 Nonrheumatic mitral (valve) prolapse: Secondary | ICD-10-CM | POA: Diagnosis present

## 2022-12-22 DIAGNOSIS — I2582 Chronic total occlusion of coronary artery: Secondary | ICD-10-CM | POA: Diagnosis present

## 2022-12-22 DIAGNOSIS — N179 Acute kidney failure, unspecified: Secondary | ICD-10-CM | POA: Diagnosis present

## 2022-12-22 DIAGNOSIS — I5033 Acute on chronic diastolic (congestive) heart failure: Secondary | ICD-10-CM | POA: Diagnosis present

## 2022-12-22 DIAGNOSIS — Z6836 Body mass index (BMI) 36.0-36.9, adult: Secondary | ICD-10-CM | POA: Diagnosis not present

## 2022-12-22 DIAGNOSIS — J9602 Acute respiratory failure with hypercapnia: Secondary | ICD-10-CM | POA: Diagnosis present

## 2022-12-22 DIAGNOSIS — Z86711 Personal history of pulmonary embolism: Secondary | ICD-10-CM

## 2022-12-22 DIAGNOSIS — Z8551 Personal history of malignant neoplasm of bladder: Secondary | ICD-10-CM

## 2022-12-22 DIAGNOSIS — Z95828 Presence of other vascular implants and grafts: Secondary | ICD-10-CM | POA: Diagnosis not present

## 2022-12-22 DIAGNOSIS — E869 Volume depletion, unspecified: Secondary | ICD-10-CM | POA: Diagnosis not present

## 2022-12-22 DIAGNOSIS — J9811 Atelectasis: Secondary | ICD-10-CM | POA: Diagnosis present

## 2022-12-22 DIAGNOSIS — R7989 Other specified abnormal findings of blood chemistry: Secondary | ICD-10-CM | POA: Insufficient documentation

## 2022-12-22 DIAGNOSIS — J9601 Acute respiratory failure with hypoxia: Secondary | ICD-10-CM | POA: Diagnosis present

## 2022-12-22 DIAGNOSIS — I471 Supraventricular tachycardia, unspecified: Secondary | ICD-10-CM | POA: Diagnosis present

## 2022-12-22 DIAGNOSIS — E876 Hypokalemia: Secondary | ICD-10-CM | POA: Diagnosis present

## 2022-12-22 DIAGNOSIS — Z87442 Personal history of urinary calculi: Secondary | ICD-10-CM

## 2022-12-22 DIAGNOSIS — I4891 Unspecified atrial fibrillation: Secondary | ICD-10-CM | POA: Diagnosis not present

## 2022-12-22 DIAGNOSIS — J90 Pleural effusion, not elsewhere classified: Secondary | ICD-10-CM | POA: Diagnosis present

## 2022-12-22 DIAGNOSIS — I493 Ventricular premature depolarization: Secondary | ICD-10-CM | POA: Diagnosis present

## 2022-12-22 DIAGNOSIS — I2489 Other forms of acute ischemic heart disease: Secondary | ICD-10-CM | POA: Diagnosis present

## 2022-12-22 DIAGNOSIS — I10 Essential (primary) hypertension: Secondary | ICD-10-CM | POA: Diagnosis not present

## 2022-12-22 DIAGNOSIS — I25118 Atherosclerotic heart disease of native coronary artery with other forms of angina pectoris: Secondary | ICD-10-CM | POA: Diagnosis not present

## 2022-12-22 DIAGNOSIS — Z7901 Long term (current) use of anticoagulants: Secondary | ICD-10-CM

## 2022-12-22 DIAGNOSIS — I509 Heart failure, unspecified: Secondary | ICD-10-CM | POA: Diagnosis not present

## 2022-12-22 DIAGNOSIS — I255 Ischemic cardiomyopathy: Secondary | ICD-10-CM | POA: Diagnosis present

## 2022-12-22 DIAGNOSIS — Z79899 Other long term (current) drug therapy: Secondary | ICD-10-CM

## 2022-12-22 DIAGNOSIS — G9341 Metabolic encephalopathy: Secondary | ICD-10-CM | POA: Diagnosis not present

## 2022-12-22 DIAGNOSIS — I252 Old myocardial infarction: Secondary | ICD-10-CM

## 2022-12-22 DIAGNOSIS — Z8711 Personal history of peptic ulcer disease: Secondary | ICD-10-CM

## 2022-12-22 DIAGNOSIS — M7989 Other specified soft tissue disorders: Secondary | ICD-10-CM | POA: Diagnosis present

## 2022-12-22 DIAGNOSIS — Z86718 Personal history of other venous thrombosis and embolism: Secondary | ICD-10-CM

## 2022-12-22 DIAGNOSIS — R001 Bradycardia, unspecified: Secondary | ICD-10-CM | POA: Diagnosis not present

## 2022-12-22 DIAGNOSIS — I5043 Acute on chronic combined systolic (congestive) and diastolic (congestive) heart failure: Secondary | ICD-10-CM | POA: Diagnosis not present

## 2022-12-22 DIAGNOSIS — R262 Difficulty in walking, not elsewhere classified: Secondary | ICD-10-CM | POA: Diagnosis present

## 2022-12-22 DIAGNOSIS — I5021 Acute systolic (congestive) heart failure: Secondary | ICD-10-CM | POA: Diagnosis not present

## 2022-12-22 DIAGNOSIS — I5020 Unspecified systolic (congestive) heart failure: Secondary | ICD-10-CM | POA: Diagnosis present

## 2022-12-22 DIAGNOSIS — I2699 Other pulmonary embolism without acute cor pulmonale: Secondary | ICD-10-CM | POA: Diagnosis present

## 2022-12-22 DIAGNOSIS — R008 Other abnormalities of heart beat: Secondary | ICD-10-CM | POA: Diagnosis present

## 2022-12-22 LAB — CBC
HCT: 45.8 % (ref 39.0–52.0)
Hemoglobin: 14.3 g/dL (ref 13.0–17.0)
MCH: 31.3 pg (ref 26.0–34.0)
MCHC: 31.2 g/dL (ref 30.0–36.0)
MCV: 100.2 fL — ABNORMAL HIGH (ref 80.0–100.0)
Platelets: 274 10*3/uL (ref 150–400)
RBC: 4.57 MIL/uL (ref 4.22–5.81)
RDW: 17.2 % — ABNORMAL HIGH (ref 11.5–15.5)
WBC: 5.6 10*3/uL (ref 4.0–10.5)
nRBC: 0.4 % — ABNORMAL HIGH (ref 0.0–0.2)

## 2022-12-22 LAB — BASIC METABOLIC PANEL
Anion gap: 12 (ref 5–15)
BUN: 34 mg/dL — ABNORMAL HIGH (ref 8–23)
CO2: 26 mmol/L (ref 22–32)
Calcium: 8.7 mg/dL — ABNORMAL LOW (ref 8.9–10.3)
Chloride: 97 mmol/L — ABNORMAL LOW (ref 98–111)
Creatinine, Ser: 2.47 mg/dL — ABNORMAL HIGH (ref 0.61–1.24)
GFR, Estimated: 28 mL/min — ABNORMAL LOW (ref >60)
Glucose, Bld: 104 mg/dL — ABNORMAL HIGH (ref 70–99)
Potassium: 4.8 mmol/L (ref 3.5–5.1)
Sodium: 135 mmol/L (ref 135–145)

## 2022-12-22 LAB — D-DIMER, QUANTITATIVE: D-Dimer, Quant: 6.14 ug/mL-FEU — ABNORMAL HIGH (ref 0.00–0.50)

## 2022-12-22 LAB — APTT: aPTT: 35 seconds (ref 24–36)

## 2022-12-22 LAB — TROPONIN I (HIGH SENSITIVITY)
Troponin I (High Sensitivity): 201 ng/L (ref <18)
Troponin I (High Sensitivity): 270 ng/L (ref <18)

## 2022-12-22 LAB — ECHOCARDIOGRAM COMPLETE: Weight: 4483.27 oz

## 2022-12-22 LAB — HEPARIN LEVEL (UNFRACTIONATED): Heparin Unfractionated: >1.1 IU/mL — ABNORMAL HIGH (ref 0.30–0.70)

## 2022-12-22 LAB — BRAIN NATRIURETIC PEPTIDE: B Natriuretic Peptide: 2212.1 pg/mL — ABNORMAL HIGH (ref 0.0–100.0)

## 2022-12-22 MED ORDER — ONDANSETRON HCL 4 MG/2ML IJ SOLN: 4.0000 mg | Freq: Four times a day (QID) | INTRAMUSCULAR | Status: AC | PRN

## 2022-12-22 MED ORDER — ASPIRIN 81 MG PO TBEC
81.0000 mg | DELAYED_RELEASE_TABLET | Freq: Every day | ORAL | Status: DC
  Administered 2022-12-23 – 2023-01-02 (×11): 81 mg via ORAL
  Filled 2022-12-22 (×11): qty 1

## 2022-12-22 MED ORDER — POTASSIUM CHLORIDE CRYS ER 20 MEQ PO TBCR
40.0000 meq | EXTENDED_RELEASE_TABLET | Freq: Once | ORAL | Status: AC
  Administered 2022-12-22: 40 meq via ORAL
  Filled 2022-12-22: qty 2

## 2022-12-22 MED ORDER — ATORVASTATIN CALCIUM 20 MG PO TABS
40.0000 mg | ORAL_TABLET | Freq: Every day | ORAL | Status: DC
  Administered 2022-12-23 – 2023-01-01 (×10): 40 mg via ORAL
  Filled 2022-12-22 (×10): qty 2

## 2022-12-22 MED ORDER — FUROSEMIDE 10 MG/ML IJ SOLN
40.0000 mg | Freq: Two times a day (BID) | INTRAMUSCULAR | Status: DC
  Administered 2022-12-23: 40 mg via INTRAVENOUS
  Filled 2022-12-22: qty 4

## 2022-12-22 MED ORDER — PERFLUTREN LIPID MICROSPHERE
1.0000 mL | INTRAVENOUS | Status: AC | PRN
  Administered 2022-12-22: 2 mL via INTRAVENOUS

## 2022-12-22 MED ORDER — ONDANSETRON HCL 4 MG PO TABS: 4.0000 mg | ORAL_TABLET | Freq: Four times a day (QID) | ORAL | Status: AC | PRN

## 2022-12-22 MED ORDER — HEPARIN (PORCINE) 25000 UT/250ML-% IV SOLN
1500.0000 [IU]/h | INTRAVENOUS | Status: DC
  Administered 2022-12-22: 1500 [IU]/h via INTRAVENOUS
  Administered 2022-12-23 – 2022-12-24 (×2): 1100 [IU]/h via INTRAVENOUS
  Administered 2022-12-25 (×2): 1300 [IU]/h via INTRAVENOUS
  Administered 2022-12-26: 1500 [IU]/h via INTRAVENOUS
  Filled 2022-12-22 (×5): qty 250

## 2022-12-22 MED ORDER — FUROSEMIDE 10 MG/ML IJ SOLN
40.0000 mg | Freq: Once | INTRAMUSCULAR | Status: AC
  Administered 2022-12-22: 40 mg via INTRAVENOUS
  Filled 2022-12-22: qty 4

## 2022-12-22 MED ORDER — ASPIRIN 325 MG PO TABS
325.0000 mg | ORAL_TABLET | Freq: Every day | ORAL | Status: DC
  Administered 2022-12-22: 325 mg via ORAL
  Filled 2022-12-22: qty 1

## 2022-12-22 MED ORDER — SENNOSIDES-DOCUSATE SODIUM 8.6-50 MG PO TABS: 1.0000 | ORAL_TABLET | Freq: Every evening | ORAL | Status: DC | PRN

## 2022-12-22 MED ORDER — ACETAMINOPHEN 325 MG PO TABS
650.0000 mg | ORAL_TABLET | Freq: Four times a day (QID) | ORAL | Status: AC | PRN
  Administered 2022-12-26: 650 mg via ORAL
  Filled 2022-12-22: qty 2

## 2022-12-22 MED ORDER — HYDRALAZINE HCL 20 MG/ML IJ SOLN: 5.0000 mg | Freq: Four times a day (QID) | INTRAMUSCULAR | Status: AC | PRN

## 2022-12-22 MED ORDER — ACETAMINOPHEN 650 MG RE SUPP: 650.0000 mg | Freq: Four times a day (QID) | RECTAL | Status: AC | PRN

## 2022-12-22 MED ORDER — HYDRALAZINE HCL 25 MG PO TABS
37.5000 mg | ORAL_TABLET | Freq: Three times a day (TID) | ORAL | Status: DC
  Administered 2022-12-23 – 2023-01-02 (×30): 37.5 mg via ORAL
  Filled 2022-12-22 (×16): qty 2
  Filled 2022-12-22: qty 1.5
  Filled 2022-12-22 (×8): qty 2
  Filled 2022-12-22: qty 1.5
  Filled 2022-12-22 (×6): qty 2

## 2022-12-22 NOTE — Assessment & Plan Note (Addendum)
With uptrending troponin and reassuring the patient denies chest pain, chest discomfort/pressure and shortness of breath Continue to monitor troponin to ensure downtrending Echo ordered Cardiology not consulted at this time pending complete echo Heparin per pharmacy has been ordered

## 2022-12-22 NOTE — Assessment & Plan Note (Signed)
-   Atorvastatin 40 mg nightly resumed 

## 2022-12-22 NOTE — Assessment & Plan Note (Signed)
Atorvastatin, aspirin resumed on admission

## 2022-12-22 NOTE — Consult Note (Signed)
ANTICOAGULATION CONSULT NOTE - Initial Consult  Pharmacy Consult for heparin infusion Indication: chest pain/ACS, apixaban PTA for DVT/PE  No Known Allergies  Patient Measurements: Height: 6' 1.5" (186.7 cm) Weight: 127.1 kg (280 lb 3.3 oz) IBW/kg (Calculated) : 81.06 Heparin Dosing Weight: 109.1 kg  Vital Signs: Temp: 98 F (36.7 C) (07/08 1538) BP: 135/75 (07/08 1730) Pulse Rate: 80 (07/08 1730)  Labs: Recent Labs    12/22/22 1620  HGB 14.3  HCT 45.8  PLT 274  CREATININE 2.47*  TROPONINIHS 201*    Estimated Creatinine Clearance: 42 mL/min (A) (by C-G formula based on SCr of 2.47 mg/dL (H)).   Medical History: Past Medical History:  Diagnosis Date   Bladder cancer (HCC)    a. 12/2015 s/p resection.   CKD (chronic kidney disease), stage II    Coronary artery disease    a. 2017 Cath: occ OM2, occ PDA (both being filled via collats), mod dzs in LAD->med rx; b. 12/2020 Cath: LM nl, LAD 70p, 98m - dLAD fills via R->L collats. RI 100 CTO - fills via collats from OM1. LCX 20m/d. OM2 100 CTO. RCA 65m, 85d. RPDA 100 - fills via collats from OM3. EF 30-35%->med rx.   HFrEF (heart failure with reduced ejection fraction) (HCC)    a. 08/2015 Echo: EF 50-55%; b. 08/2019 Echo: EF 30-35%; c. 12/2020 Echo: EF 30-35%, GrII DD. Sev apical septal, ant, inf HK. Nl RV fxn. Mild MR. Triv AI. Mild-mod Ao sclerosis.   History of DVT (deep vein thrombosis)    a. 08/2015 - s/p IVC filter; b. 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   Hyperlipidemia LDL goal <70    Hypertension    Not on medications.stopped since he ran out of medications   IDA (iron deficiency anemia)    Ischemic cardiomyopathy    a. 08/2015 Echo: EF 50-55%; b. 08/2019 Echo: EF 30-35%; c. 12/2020 Echo: EF 30-35%.   Myocardial infarction [facility])    Perforated bowel (HCC)    a. 11/2015 s/p surgical repair.   Polysubstance abuse (HCC)    a. ongoing tobacco and alcohol abuse    Pulmonary embolism (HCC)    Type II diabetes mellitus  (HCC)     Medications:  PTA: apixaban 5 mg BID - last dose: 7/8 AM per patient  Assessment: 67 y.o. male  with history of ischemic cardiomyopathy, CHF, HTN, DVT/PE s/p IVC filter on Eliquis presenting to the emergency department from clinic for evaluation of hypoxia and BLLE edema. Initial troponin I 201. Pharmacy has been consulted to initiate and manage IV heparin therapy.    Baseline: Hgb 14.3, Plt 274; HL > 1.10, aPTT 35  Goal of Therapy:  Heparin level 0.3-0.7 units/ml aPTT 66-102 seconds Monitor platelets by anticoagulation protocol: Yes   Plan:  Start heparin infusion at 1500 units/hr Check aPTT level in 6 hours given recent DOAC exposure Transition to HL once therapeutic and correlating with aPTT Continue to monitor H&H and platelets  Sharen Hones, PharmD, BCPS Clinical Pharmacist   12/22/2022,7:27 PM

## 2022-12-22 NOTE — Assessment & Plan Note (Addendum)
Patient endorses that his IVC filter from 2017 has never been removed Patient endorses compliance with home Eliquis, last Eliquis dose was 12/22/2022 in the AM VQ scan has been ordered given markedly elevated D-dimer and chest x-ray was not significant for volume overload CT of the chest for PE was not ordered due to acute kidney injury Continue heparin per pharmacy

## 2022-12-22 NOTE — Assessment & Plan Note (Signed)
Appears to be in acute exacerbation, admitted to telemetry cardiac, complete echo ordered Strict I's and O's Status post furosemide 40 mg IV one-time dose per EDP Ordered furosemide 40 mg, twice daily on 12/23/2022

## 2022-12-22 NOTE — Assessment & Plan Note (Addendum)
Etiology workup in progress, differentials include heart failure exacerbation however given chest x-ray were unremarkable, and markedly elevated D-dimer, PE cannot be excluded at this time VQ scan has been ordered Bilateral ultrasound of the lower extremity to assess for DVT Complete echo ordered Admit to telemetry cardiac, inpatient

## 2022-12-22 NOTE — Assessment & Plan Note (Signed)
Hydralazine 37.5 mg p.o. 3 times daily resumed Hydralazine 5 mg IV every 6 hours.  For SBP greater than 175, 4 days ordered

## 2022-12-22 NOTE — Assessment & Plan Note (Signed)
This meets criteria for morbid obesity based on the presence of 1 or more chronic comorbidities. Patient has hypertension and BMI of 36.5. This complicates overall care and prognosis.

## 2022-12-22 NOTE — ED Triage Notes (Signed)
Pt comes from [facility] with c/o low O2 and increased leg swelling. 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 Pt states he has noticed swelling in legs and abdomen. Pt states he has had some congestion as well. Pt states increased weight gain.   Pt was in 70 per KC and placed on 3L.   Pt RA in triage is 88% RA. Pt placed back on 2L

## 2022-12-22 NOTE — Hospital Course (Addendum)
Mr. Rupinder Therrell is a 67 year old male with heart failure reduced ejection fraction, morbid obesity, atrial fibrillation status post IVC filter placement in 2017 with recurrent DVT this patient is on Eliquis, hyperlipidemia, hypertension, who presents to the emergency department for chief concerns of swelling of bilateral lower extremities.  Vitals in the ED showed temperature of 98, respiration rate of 22, heart rate of 100, improved to 83, blood pressure 134/95, SpO2 of 88% on room air.  Serum sodium is 135, potassium 4.8, chloride 97, bicarb 26, BUN of 34, serum creatinine of 2.47, nonfasting blood glucose 104, EGFR 28, WBC 5.6, hemoglobin 14.3, platelets of 274.  BNP was elevated at 2212.1.  High sensitive troponin was elevated to 1.  D-dimer was elevated at 6.14.    Chest x-ray 2 view: No acute pulmonary abnormality.  Unchanged cardiomegaly.  ED treatment: Aspirin 325 mg p.o. one-time dose, Lasix 40 mg IV one-time dose, potassium chloride 40 mill equivalent p.o. one-time dose.

## 2022-12-22 NOTE — ED Provider Triage Note (Signed)
Emergency Medicine Provider Triage Evaluation Note  Frederick Perry, a 67 y.o. male  was evaluated in triage.  Pt complains of fluid retention, weight loss, and BLE leg swelling.  Patient with a history of NSTEMI, CHF, HLD, HTN, presents to the ED for evaluation of the symptoms.  He would endorse swelling of the legs as well as the abdomen.  He has also been found to be short of breath with room air sats less than 80%, he presented to [facility].  Patient been placed on 2 L of O2 here in triage with sats in the low 90s.  Patient denies any significant chest pain or abdominal discomfort.  Denies any fevers, chills, or sweats.  He endorses taking his Lasix as directed, but denies any significant urination.  Review of Systems  Positive: Fluid retention, shortness of breath, BLE edema  Negative: FCS  Physical Exam  BP (!) 134/95   Pulse 100   Temp 98 F (36.7 C)   Resp (!) 22   SpO2 (!) 88%  Gen:   Awake, no distress   Resp:  Normal effort. decreased lower lung sounds MSK:   Moves extremities without difficulty  Other:    Medical Decision Making  Medically screening exam initiated at 3:39 PM.  Appropriate orders placed.  Fabienne Bruns was informed that the remainder of the evaluation will be completed by another provider, this initial triage assessment does not replace that evaluation, and the importance of remaining in the ED until their evaluation is complete.  Patient to the ED for evaluation of shortness of breath, and fluid retention.   Lissa Hoard, PA-C 12/22/22 1549

## 2022-12-22 NOTE — ED Provider Notes (Signed)
[facility] Provider Note    Event Date/Time   First MD Initiated Contact with Patient 12/22/22 1601     (approximate)   History   Shortness of Breath   HPI  Frederick Perry is a 67 y.o. 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  Reports that over the past several days he has noticed significant worsening swelling in his lower extremities.  He denies any significant chest pain or shortness of breath, reports that he has difficulty ambulating due to the heaviness in his legs.  This does not feel like when he has previously had a DVT/PE.  I did review his outpatient primary care note.  He presented there with a 30 pound weight gain over the last year and a half.  Over the past few weeks had noticed increased swelling and was found to have a sat of 70% on room air without visible distress.  In the setting of this, he was directed to the ER.      Physical Exam   Triage Vital Signs: ED Triage Vitals  Enc Vitals Group     BP 12/22/22 1538 (!) 134/95     Pulse Rate 12/22/22 1538 100     Resp 12/22/22 1538 (!) 22     Temp 12/22/22 1538 98 F (36.7 C)     Temp src --      SpO2 12/22/22 1536 (!) 88 %     Weight --      Height --      Head Circumference --      Peak Flow --      Pain Score 12/22/22 1536 0     Pain Loc --      Pain Edu? --      Excl. in GC? --     Most recent vital signs: Vitals:   12/22/22 1730 12/22/22 2042  BP: 135/75   Pulse: 80   Resp: 13   Temp:  (!) 97.5 F (36.4 C)  SpO2: 92%      General: Awake, interactive  CV:  Regular rate, extend pitting edema of the bilateral lower extremities, symmetric without overlying erythema, warmth Resp:  Lungs somewhat diminished bilaterally, respirations not significantly labored  Abd:  Soft, nondistended, nontender Neuro:  Symmetric facial  movement, fluid speech   ED Results / Procedures / Treatments   Labs (all labs ordered are listed, but only abnormal results are displayed) Labs Reviewed  BASIC METABOLIC PANEL - Abnormal; Notable for the following components:      Result Value   Chloride 97 (*)    Glucose, Bld 104 (*)    BUN 34 (*)    Creatinine, Ser 2.47 (*)    Calcium 8.7 (*)    GFR, Estimated 28 (*)    All other components within normal limits  CBC - Abnormal; Notable for the following components:   MCV 100.2 (*)    RDW 17.2 (*)    nRBC 0.4 (*)    All other components within normal limits  BRAIN NATRIURETIC PEPTIDE - Abnormal; Notable for the following components:   B Natriuretic Peptide 2,212.1 (*)    All other components within normal limits  D-DIMER, QUANTITATIVE (NOT AT [facility]) - Abnormal; Notable for the following components:   D-Dimer, Quant 6.14 (*)    All other components within normal limits  HEPARIN LEVEL (  UNFRACTIONATED) - Abnormal; Notable for the following components:   Heparin Unfractionated >1.10 (*)    All other components within normal limits  TROPONIN I (HIGH SENSITIVITY) - Abnormal; Notable for the following components:   Troponin I (High Sensitivity) 201 (*)    All other components within normal limits  TROPONIN I (HIGH SENSITIVITY) - Abnormal; Notable for the following components:   Troponin I (High Sensitivity) 270 (*)    All other components within normal limits  APTT  BASIC METABOLIC PANEL  CBC  APTT  TROPONIN I (HIGH SENSITIVITY)     EKG EKG independently reviewed interpreted by myself (ER attending) demonstrates:    RADIOLOGY Imaging independently reviewed and interpreted by myself demonstrates:    PROCEDURES:  Critical Care performed: Yes, see critical care procedure note(s)  CRITICAL CARE Performed by: Trinna Post   Total critical care time: 30 minutes  Critical care time was exclusive of separately billable procedures and treating other patients.  Critical  care was necessary to treat or prevent imminent or life-threatening deterioration.  Critical care was time spent personally by me on the following activities: development of treatment plan with patient and/or surrogate as well as nursing, discussions with consultants, evaluation of patient's response to treatment, examination of patient, obtaining history from patient or surrogate, ordering and performing treatments and interventions, ordering and review of laboratory studies, ordering and review of radiographic studies, pulse oximetry and re-evaluation of patient's condition.   Procedures   MEDICATIONS ORDERED IN ED: Medications  acetaminophen (TYLENOL) tablet 650 mg (has no administration in time range)    Or  acetaminophen (TYLENOL) suppository 650 mg (has no administration in time range)  ondansetron (ZOFRAN) tablet 4 mg (has no administration in time range)    Or  ondansetron (ZOFRAN) injection 4 mg (has no administration in time range)  senna-docusate (Senokot-S) tablet 1 tablet (has no administration in time range)  heparin ADULT infusion 100 units/mL (25000 units/273mL) (1,500 Units/hr Intravenous New Bag/Given 12/22/22 2041)  atorvastatin (LIPITOR) tablet 40 mg (has no administration in time range)  aspirin EC tablet 81 mg (has no administration in time range)  hydrALAZINE (APRESOLINE) tablet 37.5 mg (has no administration in time range)  hydrALAZINE (APRESOLINE) injection 5 mg (has no administration in time range)  furosemide (LASIX) injection 40 mg (has no administration in time range)  furosemide (LASIX) injection 40 mg (40 mg Intravenous Given 12/22/22 1805)  potassium chloride SA (KLOR-CON M) CR tablet 40 mEq (40 mEq Oral Given 12/22/22 1805)     IMPRESSION / MDM / ASSESSMENT AND PLAN / ED COURSE  I reviewed the triage vital signs and the nursing notes.  Differential diagnosis includes, but is not limited to, CHF exacerbation, ACS, pulmonary embolism, pneumonia, renal  disease  Patient's presentation is most consistent with acute presentation with potential threat to life or bodily function.  67 year old male presenting to the emergency department for evaluation of lower extremity edema and hypoxia noted at outpatient office.  His clinical history is most suggestive of CHF exacerbation, though he does report he has been compliant with his torsemide.  CBC without severe derangement. BMP does demonstrate significant renal injury with a creatinine of 2.47, previously 1.18.  Troponin elevated at 201, BNP elevated at 2200.  D-dimer pending.  Ultimately anticipate admission given new oxygen requirement.  Clinical Course as of 12/22/22 2342  Mon Dec 22, 2022  1747 D-Dimer, Quant(!): 6.14 Patient noted to have significantly elevated D-dimer at 6.14.  However, he does also have an  AKI with a new GFR of 28.  Will hold off on CTA of the chest, discussed VQ scan with hospitalist team.  Do think he at least has a component of volume overload and do think his creatinine actually benefit with diuresis, dose of IV Lasix ordered.  Will reach out to hospitalist team. [NR]    Clinical Course User Index [NR] Trinna Post, MD   Case discussed with Dr. Sedalia Muta.  She will evaluate the patient for anticipated admission.   FINAL CLINICAL IMPRESSION(S) / ED DIAGNOSES   Final diagnoses:  Acute on chronic congestive heart failure, unspecified heart failure type (HCC)  Elevated d-dimer     Rx / DC Orders   ED Discharge Orders     None        Note:  This document was prepared using Dragon voice recognition software and may include unintentional dictation errors.   Trinna Post, MD 12/22/22 [phone]

## 2022-12-22 NOTE — H&P (Signed)
History and Physical   Frederick Perry [name]:[id number] DOB: 08/20/1955 DOA: 12/22/2022  PCP: Enid Baas, MD  Outpatient Specialists: Dr. Yvonne Kendall, cardiology Patient coming from: Outpatient [facility] clinic  I have personally briefly reviewed patient's old medical records in [facility] EMR.  Chief Concern: Bilateral leg swelling and weight gain  HPI: [name] is a 67 year old male with heart failure reduced ejection fraction, morbid obesity, atrial fibrillation status post IVC filter placement in 2017 with recurrent DVT this patient is on Eliquis, hyperlipidemia, hypertension, who presents to the emergency department for chief concerns of swelling of bilateral lower extremities.  Vitals in the ED showed temperature of 98, respiration rate of 22, heart rate of 100, improved to 83, blood pressure 134/95, SpO2 of 88% on room air.  Serum sodium is 135, potassium 4.8, chloride 97, bicarb 26, BUN of 34, serum creatinine of 2.47, nonfasting blood glucose 104, EGFR 28, WBC 5.6, hemoglobin 14.3, platelets of 274.  BNP was elevated at 2212.1.  High sensitive troponin was elevated to 1.  D-dimer was elevated at 6.14.    Chest x-ray 2 view: No acute pulmonary abnormality.  Unchanged cardiomegaly.  ED treatment: Aspirin 325 mg p.o. one-time dose, Lasix 40 mg IV one-time dose, potassium chloride 40 mill equivalent p.o. one-time dose. --------------------------- At bedside, patient was able to tell me his name, age, current location, current calendar year.  He reports swelling of bilateral lower extremities for approximately one week. He reports he has gained (unintentionally) 40 pounds since his last check at PCP and today. He denies chest pain, shortness of breath, dysuria, hematuria, changes to urinary habits, diarrhea, abdominal pain, trauma to his person.   He was not able to tell me how much water he drinks per day. He denies fever, chills, shortness of breath with  exertion and with laying down.   Social history: He denies tobacco use. He endorses drinking beer, three bottles of 24 oz per week. He works as a [employer].   ROS: Constitutional: no weight change, no fever ENT/Mouth: no sore throat, no rhinorrhea Eyes: no eye pain, no vision changes Cardiovascular: no chest pain, no dyspnea,  no edema, no palpitations Respiratory: no cough, no sputum, no wheezing Gastrointestinal: no nausea, no vomiting, no diarrhea, no constipation Genitourinary: no urinary incontinence, no dysuria, no hematuria Musculoskeletal: no arthralgias, no myalgias Skin: no skin lesions, no pruritus, Neuro: + weakness, no loss of consciousness, no syncope Psych: no anxiety, no depression, + decrease appetite Heme/Lymph: no bruising, no bleeding  ED Course: Discussed with emergency medicine provider, patient requiring hospitalization for chief concerns of heart failure exacerbation.  Assessment/Plan  Principal Problem:   Acute hypoxemic respiratory failure (HCC) Active Problems:   Hyperlipidemia   HFrEF (heart failure with reduced ejection fraction) (HCC)   Essential hypertension   Coronary artery disease   Pulmonary embolism (HCC)   Morbid obesity (HCC)   Elevated troponin   Assessment and Plan:  * Acute hypoxemic respiratory failure (HCC) Etiology workup in progress, differentials include heart failure exacerbation however given chest x-ray were unremarkable, and markedly elevated D-dimer, PE cannot be excluded at this time VQ scan has been ordered Bilateral ultrasound of the lower extremity to assess for DVT Complete echo ordered Admit to telemetry cardiac, inpatient  Elevated troponin With uptrending troponin and reassuring the patient denies chest pain, chest discomfort/pressure and shortness of breath Continue to monitor troponin to ensure downtrending Echo ordered Cardiology not consulted at this time pending complete echo Heparin per  pharmacy has  been ordered  Morbid obesity (HCC) This meets criteria for morbid obesity based on the presence of 1 or more chronic comorbidities. Patient has hypertension and BMI of 36.5. This complicates overall care and prognosis.  Pulmonary embolism (HCC) Patient endorses that his IVC filter from 2017 has never been removed Patient endorses compliance with home Eliquis, last Eliquis dose was 12/22/2022 in the AM VQ scan has been ordered given markedly elevated D-dimer and chest x-ray was not significant for volume overload CT of the chest for PE was not ordered due to acute kidney injury Continue heparin per pharmacy  Coronary artery disease Atorvastatin, aspirin resumed on admission  Essential hypertension Hydralazine 37.5 mg p.o. 3 times daily resumed Hydralazine 5 mg IV every 6 hours.  For SBP greater than 175, 4 days ordered  HFrEF (heart failure with reduced ejection fraction) (HCC) Appears to be in acute exacerbation, admitted to telemetry cardiac, complete echo ordered Strict I's and O's Status post furosemide 40 mg IV one-time dose per EDP Ordered furosemide 40 mg, twice daily on 12/23/2022  Hyperlipidemia Atorvastatin 40 mg nightly resumed  Pending med reconciliation, a.m. team to complete medical reconciliation  Chart reviewed.   DVT prophylaxis: heparin per pharmacy  Code Status: full code Diet: heart healthy Family Communication: none at this time Disposition Plan: pending clinical course Consults called: none at this time, pending echo and V/Q scan Admission status: telemetry cardiac, inpatient  Past Medical History:  Diagnosis Date   Bladder cancer (HCC)    a. 12/2015 s/p resection.   CKD (chronic kidney disease), stage II    Coronary artery disease    a. 2017 Cath: occ OM2, occ PDA (both being filled via collats), mod dzs in LAD->med rx; b. 12/2020 Cath: LM nl, LAD 70p, 55m - dLAD fills via R->L collats. RI 100 CTO - fills via collats from OM1. LCX 62m/d. OM2 100 CTO. RCA  31m, 85d. RPDA 100 - fills via collats from OM3. EF 30-35%->med rx.   HFrEF (heart failure with reduced ejection fraction) (HCC)    a. 08/2015 Echo: EF 50-55%; b. 08/2019 Echo: EF 30-35%; c. 12/2020 Echo: EF 30-35%, GrII DD. Sev apical septal, ant, inf HK. Nl RV fxn. Mild MR. Triv AI. Mild-mod Ao sclerosis.   History of DVT (deep vein thrombosis)    a. 08/2015 - s/p IVC filter; b. 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   Hyperlipidemia LDL goal <70    Hypertension    Not on medications.stopped since he ran out of medications   IDA (iron deficiency anemia)    Ischemic cardiomyopathy    a. 08/2015 Echo: EF 50-55%; b. 08/2019 Echo: EF 30-35%; c. 12/2020 Echo: EF 30-35%.   Myocardial infarction [facility])    Perforated bowel (HCC)    a. 11/2015 s/p surgical repair.   Polysubstance abuse (HCC)    a. ongoing tobacco and alcohol abuse    Pulmonary embolism (HCC)    Type II diabetes mellitus (HCC)    Past Surgical History:  Procedure Laterality Date   BACK SURGERY     CARDIAC CATHETERIZATION N/A 09/05/2015   Procedure: Left Heart Cath and Coronary Angiography;  Surgeon: Antonieta Iba, MD;  Location: [facility] INVASIVE CV LAB;  Service: Cardiovascular;  Laterality: N/A;   DRAIN REMOVAL Right 12/20/2015   Procedure: DRAIN REMOVAL;  Surgeon: Hildred Laser, MD;  Location: [facility] ORS;  Service: Urology;  Laterality: Right;  by dr copper   LAPAROTOMY N/A 12/14/2015   Procedure: EXPLORATORY LAPAROTOMY, prepyloric  gastric ulcer biopsy;  Surgeon: Leafy Ro, MD;  Location: [facility] ORS;  Service: General;  Laterality: N/A;   LEFT HEART CATH AND CORONARY ANGIOGRAPHY Left 01/07/2021   Procedure: LEFT HEART CATH AND CORONARY ANGIOGRAPHY;  Surgeon: Yvonne Kendall, MD;  Location: [facility] INVASIVE CV LAB;  Service: Cardiovascular;  Laterality: Left;   Patellar tendon repair Right    PERIPHERAL VASCULAR CATHETERIZATION N/A 09/07/2015   Procedure: IVC Filter Insertion;  Surgeon: Annice Needy, MD;  Location: [facility] INVASIVE CV  LAB;  Service: Cardiovascular;  Laterality: N/A;   Testicular torsion repair     TRANSURETHRAL RESECTION OF BLADDER TUMOR N/A 12/20/2015   Procedure: TRANSURETHRAL RESECTION OF BLADDER TUMOR (TURBT);  Surgeon: Hildred Laser, MD;  Location: [facility] ORS;  Service: Urology;  Laterality: N/A;   TRANSURETHRAL RESECTION OF BLADDER TUMOR N/A 03/26/2016   Procedure: TRANSURETHRAL RESECTION OF BLADDER TUMOR (TURBT);  Surgeon: Hildred Laser, MD;  Location: [facility] ORS;  Service: Urology;  Laterality: N/A;   Social History:  reports that he has been smoking cigarettes. He has a 20.00 pack-year smoking history. He has never used smokeless tobacco. He reports current alcohol use of about 10.0 standard drinks of alcohol per week. He reports that he does not use drugs.  No Known Allergies Family History  Problem Relation Age of Onset   Congestive Heart Failure Mother    Peripheral vascular disease Father    Family history: Family history reviewed and not pertinent.  Prior to Admission medications   Medication Sig Start Date End Date Taking? Authorizing Provider  apixaban (ELIQUIS) 5 MG TABS tablet Take 1 tablet (5 mg total) by mouth 2 (two) times daily. 04/04/21   Debbe Odea, MD  aspirin EC 81 MG tablet Take 81 mg by mouth daily.    [provider]  atorvastatin (LIPITOR) 40 MG tablet Take 1 tablet (40 mg total) by mouth daily. 12/09/22 03/09/23  Antonieta Iba, MD  carvedilol (COREG) 6.25 MG tablet Take 1 tablet (6.25 mg total) by mouth 2 (two) times daily. 12/09/22 03/09/23  Antonieta Iba, MD  cetirizine (ZYRTEC) 10 MG tablet Take 10 mg by mouth daily as needed for allergies.    [provider]  hydrALAZINE (APRESOLINE) 25 MG tablet Take 1.5 tablets (37.5 mg total) by mouth 3 (three) times daily. 12/09/22 03/09/23  Antonieta Iba, MD  isosorbide dinitrate (ISORDIL) 20 MG tablet TAKE 1 TABLET BY MOUTH THREE TIMES A DAY 04/26/21   Creig Hines, NP  nicotine  (NICODERM CQ - DOSED IN MG/24 HOURS) 21 mg/24hr patch One 21 patch chest wall daily (okay to substitute generic) Patient not taking: Reported on 01/22/2022 01/08/21   Alford Highland, MD  torsemide (DEMADEX) 20 MG tablet Take 1 tablet (20 mg total) by mouth daily. 01/10/21   Alford Highland, MD   Physical Exam: Vitals:   12/22/22 1700 12/22/22 1730 12/22/22 1900 12/22/22 2042  BP: (!) 128/90 135/75    Pulse: 65 80    Resp: 19 13    Temp:    (!) 97.5 F (36.4 C)  TempSrc:    Oral  SpO2: 92% 92%    Weight:   127.1 kg   Height:   6' 1.5" (1.867 m)    Constitutional: appears age-appropriate, frail Eyes: PERRL, lids and conjunctivae normal ENMT: Mucous membranes are moist. Posterior pharynx clear of any exudate or lesions. Age-appropriate dentition. Hearing appropriate Neck: normal, supple, no masses, no thyromegaly Respiratory: clear to auscultation bilaterally, no wheezing, no  crackles. Normal respiratory effort. No accessory muscle use.  Cardiovascular: Regular rate and rhythm, no murmurs / rubs / gallops.  Bilateral lower extremity edema. 2+ pedal pulses. No carotid bruits.  Abdomen: Morbidly obese abdomen, no tenderness, no masses palpated, no hepatosplenomegaly. Bowel sounds positive.  Musculoskeletal: no clubbing / cyanosis. No joint deformity upper and lower extremities. Good ROM, no contractures, no atrophy. Normal muscle tone.  Skin: no rashes, lesions, ulcers. No induration Neurologic: Sensation intact. Strength 5/5 in all 4.  Psychiatric: Normal judgment and insight. Alert and oriented x 3.  EKG: independently reviewed, showing sinus rhythm with rate of 77, QTc 466  Chest x-ray on Admission: I personally reviewed and I agree with radiologist reading as below.  DG Chest 2 View  Result Date: 12/22/2022 CLINICAL DATA:  Shortness of breath EXAM: CHEST - 2 VIEW COMPARISON:  Chest x-ray January 01, 2021 FINDINGS: Unchanged cardiomegaly and mediastinal contours. No focal pulmonary  opacity. No pleural effusion or pneumothorax. The visualized upper abdomen is unremarkable. No acute osseous abnormality. IMPRESSION: 1. No acute pulmonary abnormality. 2. Unchanged cardiomegaly. Electronically Signed   By: Jacob Moores M.D.   On: 12/22/2022 16:50    Labs on Admission: I have personally reviewed following labs  CBC: Recent Labs  Lab 12/22/22 1620  WBC 5.6  HGB 14.3  HCT 45.8  MCV 100.2*  PLT 274   Basic Metabolic Panel: Recent Labs  Lab 12/22/22 1620  NA 135  K 4.8  CL 97*  CO2 26  GLUCOSE 104*  BUN 34*  CREATININE 2.47*  CALCIUM 8.7*   GFR: Estimated Creatinine Clearance: 42 mL/min (A) (by C-G formula based on SCr of 2.47 mg/dL (H)).  Urine analysis:    Component Value Date/Time   COLORURINE YELLOW (A) 09/05/2015 1915   APPEARANCEUR Cloudy (A) 05/13/2016 1000   LABSPEC 1.031 (H) 09/05/2015 1915   PHURINE 9.0 (H) 09/05/2015 1915   GLUCOSEU Negative 05/13/2016 1000   HGBUR 3+ (A) 09/05/2015 1915   BILIRUBINUR Negative 05/13/2016 1000   KETONESUR NEGATIVE 09/05/2015 1915   PROTEINUR 2+ (A) 05/13/2016 1000   PROTEINUR 100 (A) 09/05/2015 1915   NITRITE Negative 05/13/2016 1000   NITRITE POSITIVE (A) 09/05/2015 1915   LEUKOCYTESUR 3+ (A) 05/13/2016 1000   CRITICAL CARE Performed by: Dr. Sedalia Muta  Total critical care time: 35 minutes  Critical care time was exclusive of separately billable procedures and treating other patients.  Critical care was necessary to treat or prevent imminent or life-threatening deterioration.  Critical care was time spent personally by me on the following activities: development of treatment plan with patient and/or surrogate as well as nursing, discussions with consultants, evaluation of patient's response to treatment, examination of patient, obtaining history from patient or surrogate, ordering and performing treatments and interventions, ordering and review of laboratory studies, ordering and review of radiographic  studies, pulse oximetry and re-evaluation of patient's condition.  This document was prepared using Dragon Voice Recognition software and may include unintentional dictation errors.  Dr. Sedalia Muta [facility]  If 7PM-7AM, please contact overnight-coverage provider If 7AM-7PM, please contact day attending provider [email or web address]  12/22/2022, 10:15 PM

## 2022-12-23 ENCOUNTER — Encounter: Payer: Self-pay | Admitting: Internal Medicine

## 2022-12-23 ENCOUNTER — Inpatient Hospital Stay: Payer: Medicare Other

## 2022-12-23 DIAGNOSIS — J9601 Acute respiratory failure with hypoxia: Secondary | ICD-10-CM | POA: Diagnosis not present

## 2022-12-23 LAB — CBC
HCT: 47.8 % (ref 39.0–52.0)
Hemoglobin: 14.3 g/dL (ref 13.0–17.0)
MCH: 31 pg (ref 26.0–34.0)
MCHC: 29.9 g/dL — ABNORMAL LOW (ref 30.0–36.0)
MCV: 103.5 fL — ABNORMAL HIGH (ref 80.0–100.0)
Platelets: 239 10*3/uL (ref 150–400)
RBC: 4.62 MIL/uL (ref 4.22–5.81)
RDW: 17.2 % — ABNORMAL HIGH (ref 11.5–15.5)
WBC: 7.7 10*3/uL (ref 4.0–10.5)
nRBC: 0.4 % — ABNORMAL HIGH (ref 0.0–0.2)

## 2022-12-23 LAB — URINALYSIS, ROUTINE W REFLEX MICROSCOPIC
Bilirubin Urine: NEGATIVE
Glucose, UA: NEGATIVE mg/dL
Ketones, ur: NEGATIVE mg/dL
Nitrite: NEGATIVE
Protein, ur: 100 mg/dL — AB
RBC / HPF: >50 RBC/hpf (ref 0–5)
Specific Gravity, Urine: 1.006 (ref 1.005–1.030)
pH: 6 (ref 5.0–8.0)

## 2022-12-23 LAB — ECHOCARDIOGRAM COMPLETE
AR max vel: 3.3 cm2
AV Area VTI: 2.97 cm2
AV Area mean vel: 3.04 cm2
AV Mean grad: 5.5 mmHg
AV Peak grad: 10.1 mmHg
Ao pk vel: 1.59 m/s
Area-P 1/2: 3.62 cm2
Calc EF: 58 %
Height: 73.504 in
S' Lateral: 4 cm
Single Plane A2C EF: 65.8 %
Single Plane A4C EF: 47.4 %

## 2022-12-23 LAB — APTT
aPTT: 132 seconds — ABNORMAL HIGH (ref 24–36)
aPTT: 114 seconds — ABNORMAL HIGH (ref 24–36)
aPTT: 84 seconds — ABNORMAL HIGH (ref 24–36)

## 2022-12-23 LAB — BASIC METABOLIC PANEL
Anion gap: 7 (ref 5–15)
BUN: 39 mg/dL — ABNORMAL HIGH (ref 8–23)
CO2: 30 mmol/L (ref 22–32)
Calcium: 8.5 mg/dL — ABNORMAL LOW (ref 8.9–10.3)
Chloride: 98 mmol/L (ref 98–111)
Creatinine, Ser: 2.45 mg/dL — ABNORMAL HIGH (ref 0.61–1.24)
GFR, Estimated: 28 mL/min — ABNORMAL LOW (ref >60)
Glucose, Bld: 138 mg/dL — ABNORMAL HIGH (ref 70–99)
Potassium: 5.1 mmol/L (ref 3.5–5.1)
Sodium: 135 mmol/L (ref 135–145)

## 2022-12-23 LAB — TROPONIN I (HIGH SENSITIVITY): Troponin I (High Sensitivity): 273 ng/L (ref <18)

## 2022-12-23 MED ORDER — FUROSEMIDE 10 MG/ML IJ SOLN
8.0000 mg/h | INTRAVENOUS | Status: DC
  Administered 2022-12-23: 5 mg/h via INTRAVENOUS
  Administered 2022-12-25 – 2022-12-27 (×2): 8 mg/h via INTRAVENOUS
  Filled 2022-12-23 (×4): qty 20

## 2022-12-23 MED ORDER — TECHNETIUM TO 99M ALBUMIN AGGREGATED
4.2500 | Freq: Once | INTRAVENOUS | Status: AC | PRN
  Administered 2022-12-23: 4.25 via INTRAVENOUS

## 2022-12-23 NOTE — Progress Notes (Signed)
   12/23/22 1936  Assess: MEWS Score  Temp 98.5 F (36.9 C)  BP (!) 213/198  MAP (mmHg) 204  Pulse Rate 72  Resp 20  SpO2 100 %  O2 Device Nasal Cannula  O2 Flow Rate (L/min) 5 L/min  Assess: MEWS Score  MEWS Temp 0  MEWS Systolic 2  MEWS Pulse 0  MEWS RR 0  MEWS LOC 0  MEWS Score 2  MEWS Score Color Yellow  Assess: if the MEWS score is Yellow or Red  Were vital signs taken at a resting state? Yes  Focused Assessment No change from prior assessment  Does the patient meet 2 or more of the SIRS criteria? No  MEWS guidelines implemented  No, vital signs rechecked  Assess: SIRS CRITERIA  SIRS Temperature  0  SIRS Pulse 0  SIRS Respirations  0  SIRS WBC 0  SIRS Score Sum  0   Vitals rechecked, wnl

## 2022-12-23 NOTE — Consult Note (Signed)
ANTICOAGULATION CONSULT NOTE Pharmacy Consult for heparin infusion Indication: chest pain/ACS, apixaban PTA for DVT/PE  No Known Allergies  Patient Measurements: Height: 6' 1.5" (186.7 cm) Weight: 127.1 kg (280 lb 3.3 oz) IBW/kg (Calculated) : 81.06 Heparin Dosing Weight: 109.1 kg  Vital Signs: Temp: 97.5 F (36.4 C) (07/09 1543) BP: 129/86 (07/09 1543) Pulse Rate: 64 (07/09 1543)  Labs: Recent Labs    12/22/22 1620 12/22/22 1811 12/22/22 1934 12/22/22 1934 12/23/22 0129 12/23/22 0243 12/23/22 1147 12/23/22 1823  HGB 14.3  --   --   --   --  14.3  --   --   HCT 45.8  --   --   --   --  47.8  --   --   PLT 274  --   --   --   --  239  --   --   APTT  --   --  35   < >  --  132* 114* 84*  HEPARINUNFRC  --   --  >1.10*  --   --   --   --   --   CREATININE 2.47*  --   --   --  2.45*  --   --   --   TROPONINIHS 201* 270*  --   --  273*  --   --   --    < > = values in this interval not displayed.     Estimated Creatinine Clearance: 42.3 mL/min (A) (by C-G formula based on SCr of 2.45 mg/dL (H)).   Medical History: Past Medical History:  Diagnosis Date   Bladder cancer (HCC)    a. 12/2015 s/p resection.   CKD (chronic kidney disease), stage II    Coronary artery disease    a. 2017 Cath: occ OM2, occ PDA (both being filled via collats), mod dzs in LAD->med rx; b. 12/2020 Cath: LM nl, LAD 70p, 70m - dLAD fills via R->L collats. RI 100 CTO - fills via collats from OM1. LCX 29m/d. OM2 100 CTO. RCA 44m, 85d. RPDA 100 - fills via collats from OM3. EF 30-35%->med rx.   HFrEF (heart failure with reduced ejection fraction) (HCC)    a. 08/2015 Echo: EF 50-55%; b. 08/2019 Echo: EF 30-35%; c. 12/2020 Echo: EF 30-35%, GrII DD. Sev apical septal, ant, inf HK. Nl RV fxn. Mild MR. Triv AI. Mild-mod Ao sclerosis.   History of DVT (deep vein thrombosis)    a. 08/2015 - s/p IVC filter; b. 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   Hyperlipidemia LDL goal <70    Hypertension    Not on  medications.stopped since he ran out of medications   IDA (iron deficiency anemia)    Ischemic cardiomyopathy    a. 08/2015 Echo: EF 50-55%; b. 08/2019 Echo: EF 30-35%; c. 12/2020 Echo: EF 30-35%.   Myocardial infarction [facility])    Perforated bowel (HCC)    a. 11/2015 s/p surgical repair.   Polysubstance abuse (HCC)    a. ongoing tobacco and alcohol abuse    Pulmonary embolism (HCC)    Type II diabetes mellitus (HCC)     Medications:  PTA: apixaban 5 mg BID - last dose: 7/8 AM per patient  Assessment: 67 y.o. male  with history of ischemic cardiomyopathy, CHF, HTN, DVT/PE s/p IVC filter on Eliquis presenting to the emergency department from clinic for evaluation of hypoxia and BLLE edema. Initial troponin I 201. Pharmacy has been consulted to initiate and manage IV heparin therapy.  Baseline: Hgb 14.3, Plt 274; HL > 1.10, aPTT 35  Goal of Therapy:  Heparin level 0.3-0.7 units/ml aPTT 66-102 seconds Monitor platelets by anticoagulation protocol: Yes   07/08 0243 aPTT 132, supratherapeutic 07/08 1147 aPTT 114, supratherapeutic  07/08 1823 aPTT 84, therapeutic   Plan:  Heparin therapeutic x 1  Continue heparin infusion at 1100 units/hr Recheck aPTT level in 6 hours to confirm Transition to HL once therapeutic and correlating with aPTT Continue to monitor H&H and platelets  Sharen Hones, PharmD, BCPS Clinical Pharmacist   12/23/2022 7:06 PM

## 2022-12-23 NOTE — ED Notes (Signed)
Spoke with Nuclear Medicine about pt coming to them today.

## 2022-12-23 NOTE — ED Notes (Signed)
Pt voided at this time very dark, tea colored urine. Physician updated at this time and order placed for urinalysis. Will continue to monitor.

## 2022-12-23 NOTE — ED Notes (Signed)
Pt to nuclear medicine.

## 2022-12-23 NOTE — ED Notes (Signed)
Pt resting in bed at this time with no s/sx of acute distress noted. Remains on 5L [place] for O2 support. Will continue to monitor.

## 2022-12-23 NOTE — Consult Note (Signed)
ANTICOAGULATION CONSULT NOTE Pharmacy Consult for heparin infusion Indication: chest pain/ACS, apixaban PTA for DVT/PE  No Known Allergies  Patient Measurements: Height: 6' 1.5" (186.7 cm) Weight: 127.1 kg (280 lb 3.3 oz) IBW/kg (Calculated) : 81.06 Heparin Dosing Weight: 109.1 kg  Vital Signs: Temp: 98.3 F (36.8 C) (07/09 0311) Temp Source: Oral (07/09 0311) BP: 110/70 (07/09 0200) Pulse Rate: 63 (07/09 0200)  Labs: Recent Labs    12/22/22 1620 12/22/22 1811 12/22/22 1934 12/23/22 0129 12/23/22 0243  HGB 14.3  --   --   --  14.3  HCT 45.8  --   --   --  47.8  PLT 274  --   --   --  239  APTT  --   --  35  --  132*  HEPARINUNFRC  --   --  >1.10*  --   --   CREATININE 2.47*  --   --  2.45*  --   TROPONINIHS 201* 270*  --  273*  --      Estimated Creatinine Clearance: 42.3 mL/min (A) (by C-G formula based on SCr of 2.45 mg/dL (H)).   Medical History: Past Medical History:  Diagnosis Date   Bladder cancer (HCC)    a. 12/2015 s/p resection.   CKD (chronic kidney disease), stage II    Coronary artery disease    a. 2017 Cath: occ OM2, occ PDA (both being filled via collats), mod dzs in LAD->med rx; b. 12/2020 Cath: LM nl, LAD 70p, 80m - dLAD fills via R->L collats. RI 100 CTO - fills via collats from OM1. LCX 69m/d. OM2 100 CTO. RCA 74m, 85d. RPDA 100 - fills via collats from OM3. EF 30-35%->med rx.   HFrEF (heart failure with reduced ejection fraction) (HCC)    a. 08/2015 Echo: EF 50-55%; b. 08/2019 Echo: EF 30-35%; c. 12/2020 Echo: EF 30-35%, GrII DD. Sev apical septal, ant, inf HK. Nl RV fxn. Mild MR. Triv AI. Mild-mod Ao sclerosis.   History of DVT (deep vein thrombosis)    a. 08/2015 - s/p IVC filter; b. 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   Hyperlipidemia LDL goal <70    Hypertension    Not on medications.stopped since he ran out of medications   IDA (iron deficiency anemia)    Ischemic cardiomyopathy    a. 08/2015 Echo: EF 50-55%; b. 08/2019 Echo: EF 30-35%;  c. 12/2020 Echo: EF 30-35%.   Myocardial infarction [facility])    Perforated bowel (HCC)    a. 11/2015 s/p surgical repair.   Polysubstance abuse (HCC)    a. ongoing tobacco and alcohol abuse    Pulmonary embolism (HCC)    Type II diabetes mellitus (HCC)     Medications:  PTA: apixaban 5 mg BID - last dose: 7/8 AM per patient  Assessment: 67 y.o. male  with history of ischemic cardiomyopathy, CHF, HTN, DVT/PE s/p IVC filter on Eliquis presenting to the emergency department from clinic for evaluation of hypoxia and BLLE edema. Initial troponin I 201. Pharmacy has been consulted to initiate and manage IV heparin therapy.    Baseline: Hgb 14.3, Plt 274; HL > 1.10, aPTT 35  Goal of Therapy:  Heparin level 0.3-0.7 units/ml aPTT 66-102 seconds Monitor platelets by anticoagulation protocol: Yes   07/08 0243 aPTT 132, supratherapeutic  Plan:  Decrease heparin infusion to 1300 units/hr Recheck aPTT level in 6 hours after rate change Transition to HL once therapeutic and correlating with aPTT Continue to monitor H&H and platelets  Harrold Donath  S. Rosalyn Gess, PharmD, [facility] 12/23/2022 3:35 AM

## 2022-12-23 NOTE — Progress Notes (Signed)
Progress Note   Patient: Frederick Perry [name]:[id number] DOB: Feb 09, 1956 DOA: 12/22/2022     1 DOS: the patient was seen and examined on 12/23/2022    Subjective:  Patient seen and examined at bedside this morning He denied worsening nausea vomiting chest pain cough or urinary complaints Currently on heparin drip Troponin slightly   Brief hospital course: From HPI: "Mr. Asher Rajaram is a 67 year old male with heart failure reduced ejection fraction, morbid obesity, atrial fibrillation status post IVC filter placement in 2017 with recurrent DVT this patient is on Eliquis, hyperlipidemia, hypertension, who presents to the emergency department for chief concerns of swelling of bilateral lower extremities. Vitals in the ED showed temperature of 98, respiration rate of 22, heart rate of 100, improved to 83, blood pressure 134/95, SpO2 of 88% on room air.  BNP was elevated at 2212.1.  High sensitive troponin was elevated 201--270--273.  D-dimer was elevated at 6.14.  VQ scan requested pending"    Assessment and Plan: Acute hypoxemic respiratory failure (HCC) Etiology workup in progress, differentials include heart failure exacerbation however given chest x-ray were unremarkable, and markedly elevated D-dimer, PE cannot be excluded at this time Unable to obtain CTA of the chest as renal function does not allow VQ scan performed back pending Lower extremity Doppler did not show DVT Follow-up on echocardiogram    Elevated troponin With uptrending troponin and reassuring the patient denies chest pain, chest discomfort/pressure and shortness of breath Continue to monitor troponin to ensure downtrending Continue telemetry monitoring Echo ordered I discussed the case with Dr. Okey Dupre cardiologist We have agreed to follow-up on the VQ scan and if results comes back negative to let them know and they plan to see the patient in the morning. Cardiology also agrees that if anything happens overnight  to let them know. Continue heparin drip until PE is ruled out    Acute kidney injury likely secondary to underlying congestive heart failure exacerbation Patient presented with creatinine 2.47 however last creatinine was 1.11-year ago Nephrology have been consulted Renally dose all drugs Monitor renal function closely  Morbid obesity (HCC) This meets criteria for morbid obesity based on the presence of 1 or more chronic comorbidities. Patient has hypertension and BMI of 36.5. This complicates overall care and prognosis.   History of pulmonary embolism (HCC) Patient endorses that his IVC filter from 2017 has never been removed Patient endorses compliance with home Eliquis, VQ scan has been ordered given markedly elevated D-dimer  CT of the chest for PE was not ordered due to acute kidney injury Continue heparin per pharmacy VQ scan done today however results pending Doppler of lower extremity did not show any evidence of DVT  Coronary artery disease Continue atorvastatin, aspirin resumed on admission   Essential hypertension Hydralazine 37.5 mg p.o. 3 times daily resumed Hydralazine 5 mg IV every 6 hours.  For SBP greater than 175, 4 days ordered   HFrEF (heart failure with reduced ejection fraction) (HCC) Appears to be in acute exacerbation, BNP significantly elevated 2212 Follow-up on echocardiogram Continue Lasix Status post furosemide 40 mg IV one-time dose per EDP Monitor daily weight   Hyperlipidemia Continue atorvastatin 40 mg nightly resumed      DVT prophylaxis: heparin per pharmacy   Code Status: full code  Diet: heart healthy  Family Communication: none at this time  Disposition Plan: pending clinical course   Physical Exam:  Constitutional: Appears chronically ill Eyes: PERRL, lids and conjunctivae normal ENNT: Mucous membranes are moist.  Posterior pharynx clear of any exudate or lesions. Age-appropriate dentition. Hearing appropriate Neck: normal,  supple, no masses, no thyromegaly Respiratory: clear to auscultation bilaterally, no wheezing, no crackles. Normal respiratory effort. No accessory muscle use.  Cardiovascular: Regular rate and rhythm Bilateral lower extremity pitting edema Abdomen: Morbidly obese abdomen, no tenderness, no masses palpated, no hepatosplenomegaly. Bowel sounds positive.  Musculoskeletal: no clubbing / cyanosis. No joint deformity upper and lower extremities. Good ROM, no contractures, no atrophy. Normal muscle tone.  Skin: no rashes, lesions, ulcers. No induration Neurologic: Sensation intact. Strength 5/5 in all 4.  Psychiatric: Normal judgment and insight. Alert and oriented x 3.  Data Reviewed:I reviewed patient's imaging, lab results, vitals, documentation by consulting physician as well as nursing staff    Time spent: 60 minutes discussing patient goals of care with cardiologist Dr. Okey Dupre as well as nephrologist reviewing patient's chart including imaging and lab results as well as taking care of patient   Vitals:   12/23/22 1100 12/23/22 1145 12/23/22 1200 12/23/22 1359  BP: 114/69 122/85 129/85   Pulse: 64 65 64   Resp: 17 (!) 25 19 15   Temp:      TempSrc:      SpO2: 93% 96% 97%   Weight:      Height:         Author: Loyce Dys, MD 12/23/2022 3:00 PM  For on call review [email or web address].

## 2022-12-23 NOTE — Consult Note (Signed)
[facility] CONSULT NOTE    Date: 12/23/2022                  Patient Name:  Frederick Perry  MRN: [id number]  DOB: [birth year]  Age / Sex: 67 y.o., male         PCP: Enid Baas, MD                 Service Requesting Consult: Hospitalist                 Reason for Consult: Acute kidney injury, chronic kidney disease stage II            History of Present Illness: Patient is a 67 y.o. male with a PMHx of chronic diastolic heart failure, morbid obesity, atrial fibrillation, history of IVC placement, history of recurrent DVT, hyperlipidemia, hypertension, who was admitted to [facility] on 12/22/2022 for evaluation of increasing lower extremity edema.   He states that over the past several days he had increasing lower extremity edema.  He has had increased salty food intake as well as increased fluid intake.  He states that he was eating potato chips at home.  We are asked to see him for evaluation management of acute kidney injury.  Upon admission creatinine was 2.47 with an EGFR 28.  Previously his baseline creatinine was 1.18.  He had significantly elevated BNP of 2212.  He has been started on Lasix injections.   Medications: Outpatient medications: Medications Prior to Admission  Medication Sig Dispense Refill Last Dose   apixaban (ELIQUIS) 5 MG TABS tablet Take 1 tablet (5 mg total) by mouth 2 (two) times daily. 60 tablet 5 12/22/2022 at 0700   aspirin EC 81 MG tablet Take 81 mg by mouth daily.   12/22/2022 at 0700   atorvastatin (LIPITOR) 40 MG tablet Take 1 tablet (40 mg total) by mouth daily. 90 tablet 3 12/22/2022   carvedilol (COREG) 6.25 MG tablet Take 1 tablet (6.25 mg total) by mouth 2 (two) times daily. 180 tablet 0 12/22/2022   cetirizine (ZYRTEC) 10 MG tablet Take 10 mg by mouth daily as needed for allergies.   prn   hydrALAZINE (APRESOLINE) 25 MG tablet Take 1.5 tablets (37.5 mg total) by mouth 3 (three) times daily. 135 tablet 0 12/22/2022   isosorbide  dinitrate (ISORDIL) 20 MG tablet TAKE 1 TABLET BY MOUTH THREE TIMES A DAY 270 tablet 2 12/22/2022   torsemide (DEMADEX) 20 MG tablet Take 1 tablet (20 mg total) by mouth daily. 30 tablet 0 12/22/2022   nicotine (NICODERM CQ - DOSED IN MG/24 HOURS) 21 mg/24hr patch One 21 patch chest wall daily (okay to substitute generic) (Patient not taking: Reported on 01/22/2022) 28 patch 0     Current medications: Current Facility-Administered Medications  Medication Dose Route Frequency Provider Last Rate Last Admin   acetaminophen (TYLENOL) tablet 650 mg  650 mg Oral Q6H PRN Cox, Amy N, DO       Or   acetaminophen (TYLENOL) suppository 650 mg  650 mg Rectal Q6H PRN Cox, Amy N, DO       aspirin EC tablet 81 mg  81 mg Oral Daily Cox, Amy N, DO   81 mg at 12/23/22 0910   atorvastatin (LIPITOR) tablet 40 mg  40 mg Oral QHS Cox, Amy N, DO       furosemide (LASIX) injection 40 mg  40 mg Intravenous BID Cox, Amy N, DO   40 mg at 12/23/22  4132   heparin ADULT infusion 100 units/mL (25000 units/23mL)  1,100 Units/hr Intravenous Continuous Barrie Folk, RPH 11 mL/hr at 12/23/22 1550 1,100 Units/hr at 12/23/22 1550   hydrALAZINE (APRESOLINE) injection 5 mg  5 mg Intravenous Q6H PRN Cox, Amy N, DO       hydrALAZINE (APRESOLINE) tablet 37.5 mg  37.5 mg Oral TID Cox, Amy N, DO   37.5 mg at 12/23/22 1547   ondansetron (ZOFRAN) tablet 4 mg  4 mg Oral Q6H PRN Cox, Amy N, DO       Or   ondansetron (ZOFRAN) injection 4 mg  4 mg Intravenous Q6H PRN Cox, Amy N, DO       senna-docusate (Senokot-S) tablet 1 tablet  1 tablet Oral QHS PRN Cox, Amy N, DO          Allergies: No Known Allergies    Past Medical History: Past Medical History:  Diagnosis Date   Bladder cancer (HCC)    a. 12/2015 s/p resection.   CKD (chronic kidney disease), stage II    Coronary artery disease    a. 2017 Cath: occ OM2, occ PDA (both being filled via collats), mod dzs in LAD->med rx; b. 12/2020 Cath: LM nl, LAD 70p, 6m - dLAD fills via  R->L collats. RI 100 CTO - fills via collats from OM1. LCX 4m/d. OM2 100 CTO. RCA 76m, 85d. RPDA 100 - fills via collats from OM3. EF 30-35%->med rx.   HFrEF (heart failure with reduced ejection fraction) (HCC)    a. 08/2015 Echo: EF 50-55%; b. 08/2019 Echo: EF 30-35%; c. 12/2020 Echo: EF 30-35%, GrII DD. Sev apical septal, ant, inf HK. Nl RV fxn. Mild MR. Triv AI. Mild-mod Ao sclerosis.   History of DVT (deep vein thrombosis)    a. 08/2015 - s/p IVC filter; b. 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   Hyperlipidemia LDL goal <70    Hypertension    Not on medications.stopped since he ran out of medications   IDA (iron deficiency anemia)    Ischemic cardiomyopathy    a. 08/2015 Echo: EF 50-55%; b. 08/2019 Echo: EF 30-35%; c. 12/2020 Echo: EF 30-35%.   Myocardial infarction [facility])    Perforated bowel (HCC)    a. 11/2015 s/p surgical repair.   Polysubstance abuse (HCC)    a. ongoing tobacco and alcohol abuse    Pulmonary embolism (HCC)    Type II diabetes mellitus (HCC)      Past Surgical History: Past Surgical History:  Procedure Laterality Date   BACK SURGERY     CARDIAC CATHETERIZATION N/A 09/05/2015   Procedure: Left Heart Cath and Coronary Angiography;  Surgeon: Antonieta Iba, MD;  Location: [facility] INVASIVE CV LAB;  Service: Cardiovascular;  Laterality: N/A;   DRAIN REMOVAL Right 12/20/2015   Procedure: DRAIN REMOVAL;  Surgeon: Hildred Laser, MD;  Location: [facility] ORS;  Service: Urology;  Laterality: Right;  by dr copper   LAPAROTOMY N/A 12/14/2015   Procedure: EXPLORATORY LAPAROTOMY, prepyloric gastric ulcer biopsy;  Surgeon: Leafy Ro, MD;  Location: [facility] ORS;  Service: General;  Laterality: N/A;   LEFT HEART CATH AND CORONARY ANGIOGRAPHY Left 01/07/2021   Procedure: LEFT HEART CATH AND CORONARY ANGIOGRAPHY;  Surgeon: Yvonne Kendall, MD;  Location: [facility] INVASIVE CV LAB;  Service: Cardiovascular;  Laterality: Left;   Patellar tendon repair Right    PERIPHERAL VASCULAR  CATHETERIZATION N/A 09/07/2015   Procedure: IVC Filter Insertion;  Surgeon: Annice Needy, MD;  Location: [facility] INVASIVE CV LAB;  Service: Cardiovascular;  Laterality: N/A;   Testicular torsion repair     TRANSURETHRAL RESECTION OF BLADDER TUMOR N/A 12/20/2015   Procedure: TRANSURETHRAL RESECTION OF BLADDER TUMOR (TURBT);  Surgeon: Hildred Laser, MD;  Location: [facility] ORS;  Service: Urology;  Laterality: N/A;   TRANSURETHRAL RESECTION OF BLADDER TUMOR N/A 03/26/2016   Procedure: TRANSURETHRAL RESECTION OF BLADDER TUMOR (TURBT);  Surgeon: Hildred Laser, MD;  Location: [facility] ORS;  Service: Urology;  Laterality: N/A;     Family History: Family History  Problem Relation Age of Onset   Congestive Heart Failure Mother    Peripheral vascular disease Father      Social History: Social History   Socioeconomic History   Marital status: Divorced    Spouse name: Not on file   Number of children: Not on file   Years of education: Not on file   Highest education level: Not on file  Occupational History   Not on file  Tobacco Use   Smoking status: Every Day    Packs/day: 0.50    Years: 40.00    Additional pack years: 0.00    Total pack years: 20.00    Types: Cigarettes   Smokeless tobacco: Never  Vaping Use   Vaping Use: Never used  Substance and Sexual Activity   Alcohol use: Yes    Alcohol/week: 10.0 standard drinks of alcohol    Types: 10 Cans of beer per week    Comment: weekly- beers and occasionally liquor   Drug use: No   Sexual activity: Not on file  Other Topics Concern   Not on file  Social History Narrative   Lives at home with Mother and is his Mothers primary caregiver   Social Determinants of Health   Financial Resource Strain: Medium Risk (01/09/2021)   Overall Financial Resource Strain (CARDIA)    Difficulty of Paying Living Expenses: Somewhat hard  Food Insecurity: No Food Insecurity (12/23/2022)   Hunger Vital Sign    Worried About Running Out of Food in the  Last Year: Never true    Ran Out of Food in the Last Year: Never true  Transportation Needs: No Transportation Needs (12/23/2022)   PRAPARE - Administrator, Civil Service (Medical): No    Lack of Transportation (Non-Medical): No  Physical Activity: Unknown (02/04/2018)   Exercise Vital Sign    Days of Exercise per Week: Patient declined    Minutes of Exercise per Session: Patient declined  Stress: Not on file  Social Connections: Unknown (02/04/2018)   Social Connection and Isolation Panel [NHANES]    Frequency of Communication with Friends and Family: Patient declined    Frequency of Social Gatherings with Friends and Family: Patient declined    Attends Religious Services: Patient declined    Database administrator or Organizations: Patient declined    Attends Banker Meetings: Patient declined    Marital Status: Patient declined  Intimate Partner Violence: Not At Risk (12/23/2022)   Humiliation, Afraid, Rape, and Kick questionnaire    Fear of Current or Ex-Partner: No    Emotionally Abused: No    Physically Abused: No    Sexually Abused: No     Review of Systems: Review of Systems  Constitutional:  Positive for malaise/fatigue. Negative for chills and fever.  HENT:  Negative for congestion, hearing loss and tinnitus.   Eyes:  Negative for blurred vision and double vision.  Respiratory:  Negative for cough, sputum production and shortness of breath.  Cardiovascular:  Positive for leg swelling. Negative for chest pain, palpitations and orthopnea.  Gastrointestinal:  Negative for diarrhea, nausea and vomiting.  Genitourinary:  Negative for dysuria, frequency and urgency.  Musculoskeletal:  Negative for myalgias.  Skin:  Negative for itching and rash.  Neurological:  Negative for dizziness and focal weakness.  Endo/Heme/Allergies:  Negative for polydipsia. Does not bruise/bleed easily.  Psychiatric/Behavioral:  The patient is not nervous/anxious.       Vital Signs: Blood pressure 129/86, pulse 64, temperature (!) 97.5 F (36.4 C), resp. rate 18, height 6' 1.5" (1.867 m), weight 127.1 kg, SpO2 98 %.  Weight trends: Filed Weights   12/22/22 1900  Weight: 127.1 kg     Physical Exam: General: No acute distress  Head: Normocephalic, atraumatic. Moist oral mucosal membranes  Eyes: Anicteric  Neck: Supple  Lungs:  Clear to auscultation, normal effort  Heart: S1S2 no rubs  Abdomen:  Soft, nontender, bowel sounds present  Extremities: 2+ peripheral edema.  Neurologic: Awake, alert, following commands  Skin: No acute rash  Access: No hemodialysis access    Lab results: Basic Metabolic Panel: Recent Labs  Lab 12/22/22 1620 12/23/22 0129  NA 135 135  K 4.8 5.1  CL 97* 98  CO2 26 30  GLUCOSE 104* 138*  BUN 34* 39*  CREATININE 2.47* 2.45*  CALCIUM 8.7* 8.5*    Liver Function Tests: No results for input(s): "AST", "ALT", "ALKPHOS", "BILITOT", "PROT", "ALBUMIN" in the last 168 hours. No results for input(s): "LIPASE", "AMYLASE" in the last 168 hours. No results for input(s): "AMMONIA" in the last 168 hours.  CBC: Recent Labs  Lab 12/22/22 1620 12/23/22 0243  WBC 5.6 7.7  HGB 14.3 14.3  HCT 45.8 47.8  MCV 100.2* 103.5*  PLT 274 239    Cardiac Enzymes: No results for input(s): "CKTOTAL", "CKMB", "CKMBINDEX", "TROPONINI" in the last 168 hours.  BNP: Invalid input(s): "POCBNP"  CBG: No results for input(s): "GLUCAP" in the last 168 hours.  Microbiology: Results for orders placed or performed during the hospital encounter of 01/01/21  Resp Panel by RT-PCR (Flu A&B, Covid) Nasopharyngeal Swab     Status: None   Collection Time: 01/01/21  4:57 PM   Specimen: Nasopharyngeal Swab; Nasopharyngeal(NP) swabs in vial transport medium  Result Value Ref Range Status   SARS Coronavirus 2 by RT PCR NEGATIVE NEGATIVE Final    Comment: (NOTE) SARS-CoV-2 target nucleic acids are NOT DETECTED.  The SARS-CoV-2 RNA is  generally detectable in upper respiratory specimens during the acute phase of infection. The lowest concentration of SARS-CoV-2 viral copies this assay can detect is 138 copies/mL. A negative result does not preclude SARS-Cov-2 infection and should not be used as the sole basis for treatment or other patient management decisions. A negative result may occur with  improper specimen collection/handling, submission of specimen other than nasopharyngeal swab, presence of viral mutation(s) within the areas targeted by this assay, and inadequate number of viral copies(<138 copies/mL). A negative result must be combined with clinical observations, patient history, and epidemiological information. The expected result is Negative.  Fact Sheet for Patients:  BloggerCourse.com  Fact Sheet for Healthcare Providers:  SeriousBroker.it  This test is no t yet approved or cleared by the [place] FDA and  has been authorized for detection and/or diagnosis of SARS-CoV-2 by FDA under an Emergency Use Authorization (EUA). This EUA will remain  in effect (meaning this test can be used) for the duration of the COVID-19 declaration under Section 564(b)(1) of  the Act, 21 U.S.C.section 360bbb-3(b)(1), unless the authorization is terminated  or revoked sooner.       Influenza A by PCR NEGATIVE NEGATIVE Final   Influenza B by PCR NEGATIVE NEGATIVE Final    Comment: (NOTE) The Xpert Xpress SARS-CoV-2/FLU/RSV plus assay is intended as an aid in the diagnosis of influenza from Nasopharyngeal swab specimens and should not be used as a sole basis for treatment. Nasal washings and aspirates are unacceptable for Xpert Xpress SARS-CoV-2/FLU/RSV testing.  Fact Sheet for Patients: BloggerCourse.com  Fact Sheet for Healthcare Providers: SeriousBroker.it  This test is not yet approved or cleared by the [place] FDA and has been authorized for detection and/or diagnosis of SARS-CoV-2 by FDA under an Emergency Use Authorization (EUA). This EUA will remain in effect (meaning this test can be used) for the duration of the COVID-19 declaration under Section 564(b)(1) of the Act, 21 U.S.C. section 360bbb-3(b)(1), unless the authorization is terminated or revoked.  Performed at [facility], [address] [place], [place] [address]     Coagulation Studies: No results for input(s): "LABPROT", "INR" in the last 72 hours.  Urinalysis: Recent Labs    12/23/22 0650  COLORURINE AMBER*  LABSPEC 1.006  PHURINE 6.0  GLUCOSEU NEGATIVE  HGBUR MODERATE*  BILIRUBINUR NEGATIVE  KETONESUR NEGATIVE  PROTEINUR 100*  NITRITE NEGATIVE  LEUKOCYTESUR SMALL*      Imaging: US RENAL  Result Date: 12/23/2022 CLINICAL DATA:  Acute kidney failure EXAM: RENAL / URINARY TRACT ULTRASOUND COMPLETE COMPARISON:  CT angiogram abdomen and pelvis December 14, 2015 FINDINGS: Right Kidney: Renal measurements: 10.7 x 5.6 x 5.8 cm = volume: 182.0 mL. Echogenicity within normal limits. No mass or hydronephrosis visualized. Left Kidney: Renal measurements: 11.3 x 6.3 x 5.3 cm = volume: 198.1 mL. Echogenicity within normal limits. No hydronephrosis. There is an exophytic benign cyst originating from the mid-pole, measuring 3.4 x 2.3 x 3.1 cm. Bladder: Appears normal for degree of bladder distention. Other: None. IMPRESSION: No acute findings. Electronically Signed   By: Jacob Moores M.D.   On: 12/23/2022 12:21   ECHOCARDIOGRAM COMPLETE  Result Date: 12/23/2022    ECHOCARDIOGRAM REPORT   Patient Name:   Frederick Perry Date of Exam: 12/22/2022 Medical Rec #:  [id number]         Height:       73.5 in Accession #:    [id number]        Weight:       280.2 lb Date of Birth:  Aug 03, 1955        BSA:          2.496 m Patient Age:    66 years          BP:           122/75 mmHg Patient Gender: M                 HR:            69 bpm. Exam Location:  [facility] Procedure: 2D Echo, Cardiac Doppler, Color Doppler and Intracardiac            Opacification Agent Indications:     R06.00 Dyspnea  History:         Patient has prior history of Echocardiogram examinations, most                  recent 01/07/2021. CAD and Previous Myocardial Infarction; Risk  Factors:Diabetes, Hypertension and Dyslipidemia. Ischemic                  cardiomyopathy. Pulmonary embolism. Chronic kidney disease.  Sonographer:     Daphine Deutscher RDCS Referring Phys:  [id number] AMY N COX Diagnosing Phys: Yvonne Kendall MD IMPRESSIONS  1. Left ventricular ejection fraction, by estimation, is 50 to 55%. The left ventricle has low normal function. The left ventricle demonstrates regional wall motion abnormalities (see scoring diagram/findings for description). Left ventricular diastolic  parameters are consistent with Grade II diastolic dysfunction (pseudonormalization). Elevated left atrial pressure.  2. Right ventricular systolic function is mildly reduced. The right ventricular size is mildly enlarged. There is mildly elevated pulmonary artery systolic pressure.  3. Left atrial size was mildly dilated.  4. Right atrial size was mildly dilated.  5. The mitral valve is abnormal. Mild mitral valve regurgitation. No evidence of mitral stenosis. There is mild late systolic prolapse of posterior leaflet of the mitral valve. Cannot exclude a ruptured chord.  6. The aortic valve is tricuspid. There is moderate calcification of the aortic valve. There is moderate thickening of the aortic valve. Aortic valve regurgitation is not visualized. Aortic valve sclerosis/calcification is present, without any evidence of aortic stenosis.  7. The inferior vena cava is dilated in size with <50% respiratory variability, suggesting right atrial pressure of 15 mmHg. FINDINGS  Left Ventricle: Left ventricular ejection fraction, by estimation, is 50 to 55%. The left ventricle has  low normal function. The left ventricle demonstrates regional wall motion abnormalities. Definity contrast agent was given IV to delineate the left ventricular endocardial borders. The left ventricular internal cavity size was normal in size. There is no left ventricular hypertrophy. Left ventricular diastolic parameters are consistent with Grade II diastolic dysfunction (pseudonormalization). Elevated left atrial pressure.  LV Wall Scoring: The basal inferolateral segment, apical lateral segment, apical anterior segment, and apex are hypokinetic. The anterior wall, antero-lateral wall, entire septum, entire inferior wall, and mid inferolateral segment are normal. Right Ventricle: The right ventricular size is mildly enlarged. No increase in right ventricular wall thickness. Right ventricular systolic function is mildly reduced. There is mildly elevated pulmonary artery systolic pressure. The tricuspid regurgitant  velocity is 2.41 m/s, and with an assumed right atrial pressure of 15 mmHg, the estimated right ventricular systolic pressure is 38.2 mmHg. Left Atrium: Left atrial size was mildly dilated. Right Atrium: Right atrial size was mildly dilated. Pericardium: The pericardium was not well visualized. Mitral Valve: The mitral valve is abnormal. There is mild late systolic prolapse of posterior leaflet of the mitral valve. There is mild thickening of the mitral valve leaflet(s). Mild mitral valve regurgitation. No evidence of mitral valve stenosis. Tricuspid Valve: The tricuspid valve is normal in structure. Tricuspid valve regurgitation is mild. Aortic Valve: The aortic valve is tricuspid. There is moderate calcification of the aortic valve. There is moderate thickening of the aortic valve. Aortic valve regurgitation is not visualized. Aortic valve sclerosis/calcification is present, without any  evidence of aortic stenosis. Aortic valve mean gradient measures 5.5 mmHg. Aortic valve peak gradient measures 10.1  mmHg. Aortic valve area, by VTI measures 2.97 cm. Pulmonic Valve: The pulmonic valve was normal in structure. Pulmonic valve regurgitation is not visualized. No evidence of pulmonic stenosis. Aorta: The aortic root and ascending aorta are structurally normal, with no evidence of dilitation. Pulmonary Artery: The pulmonary artery is of normal size. Venous: The inferior vena cava is dilated in size with less than 50% respiratory variability,  suggesting right atrial pressure of 15 mmHg. IAS/Shunts: The interatrial septum was not well visualized.  LEFT VENTRICLE PLAX 2D LVIDd:         5.40 cm      Diastology LVIDs:         4.00 cm      LV e' medial:    4.75 cm/s LV PW:         1.00 cm      LV E/e' medial:  18.8 LV IVS:        1.00 cm      LV e' lateral:   7.47 cm/s LVOT diam:     2.20 cm      LV E/e' lateral: 12.0 LV SV:         95 LV SV Index:   38 LVOT Area:     3.80 cm  LV Volumes (MOD) LV vol d, MOD A2C: 118.3 ml LV vol d, MOD A4C: 122.7 ml LV vol s, MOD A2C: 40.5 ml LV vol s, MOD A4C: 64.6 ml LV SV MOD A2C:     77.8 ml LV SV MOD A4C:     122.7 ml LV SV MOD BP:      70.5 ml RIGHT VENTRICLE             IVC RV Basal diam:  4.60 cm     IVC diam: 2.80 cm RV S prime:     10.10 cm/s TAPSE (M-mode): 1.9 cm LEFT ATRIUM              Index        RIGHT ATRIUM           Index LA diam:        5.20 cm  2.08 cm/m   RA Area:     28.00 cm LA Vol (A2C):   109.0 ml 43.67 ml/m  RA Volume:   100.00 ml 40.07 ml/m LA Vol (A4C):   66.2 ml  26.52 ml/m LA Biplane Vol: 87.2 ml  34.94 ml/m  AORTIC VALVE AV Area (Vmax):    3.30 cm AV Area (Vmean):   3.04 cm AV Area (VTI):     2.97 cm AV Vmax:           159.13 cm/s AV Vmean:          110.579 cm/s AV VTI:            0.319 m AV Peak Grad:      10.1 mmHg AV Mean Grad:      5.5 mmHg LVOT Vmax:         138.00 cm/s LVOT Vmean:        88.350 cm/s LVOT VTI:          0.250 m LVOT/AV VTI ratio: 0.78  AORTA Ao Root diam: 3.20 cm Ao Asc diam:  3.40 cm MITRAL VALVE               TRICUSPID VALVE  MV Area (PHT): 3.62 cm    TR Peak grad:   23.2 mmHg MV Decel Time: 210 msec    TR Vmax:        241.00 cm/s MV E velocity: 89.50 cm/s MV A velocity: 76.55 cm/s  SHUNTS MV E/A ratio:  1.17        Systemic VTI:  0.25 m  Systemic Diam: 2.20 cm Yvonne Kendall MD Electronically signed by Yvonne Kendall MD Signature Date/Time: 12/23/2022/10:30:45 AM    Final    US Venous Img Lower Bilateral (DVT)  Result Date: 12/23/2022 CLINICAL DATA:  Leg swelling EXAM: BILATERAL LOWER EXTREMITY VENOUS DOPPLER ULTRASOUND TECHNIQUE: Gray-scale sonography with compression, as well as color and duplex ultrasound, were performed to evaluate the deep venous system(s) from the level of the common femoral vein through the popliteal and proximal calf veins. COMPARISON:  07/12/2019 FINDINGS: VENOUS Normal compressibility of the common femoral, superficial femoral, and popliteal veins, as well as the visualized calf veins. Visualized portions of profunda femoral vein and great saphenous vein unremarkable. No filling defects to suggest DVT on grayscale or color Doppler imaging. Doppler waveforms show normal direction of venous flow, normal respiratory plasticity and response to augmentation. OTHER None. Limitations: none IMPRESSION: Negative. Electronically Signed   By: Charlett Nose M.D.   On: 12/23/2022 01:35   DG Chest 2 View  Result Date: 12/22/2022 CLINICAL DATA:  Shortness of breath EXAM: CHEST - 2 VIEW COMPARISON:  Chest x-ray January 01, 2021 FINDINGS: Unchanged cardiomegaly and mediastinal contours. No focal pulmonary opacity. No pleural effusion or pneumothorax. The visualized upper abdomen is unremarkable. No acute osseous abnormality. IMPRESSION: 1. No acute pulmonary abnormality. 2. Unchanged cardiomegaly. Electronically Signed   By: Jacob Moores M.D.   On: 12/22/2022 16:50     Assessment & Plan: Pt is a 67 y.o. male with a PMHx of chronic diastolic heart failure, morbid obesity, atrial  fibrillation, history of IVC placement, history of recurrent DVT, hyperlipidemia, hypertension, who was admitted to [facility] on 12/22/2022 for evaluation of increasing lower extremity edema.   1.  Acute kidney injury/chronic kidney disease stage II baseline creatinine 1.18 with EGFR 69.  Suspect that the patient's acute kidney injury is related to cardiorenal syndrome.  Renal ultrasound negative for hydronephrosis.  Okay to continue diuretic therapy to alleviate heart failure symptoms.  2.  Acute on chronic diastolic heart failure.  Significantly elevated BNP of 2212.  Patient started on Lasix injections.  Switch the patient over to Lasix drip at 5 mg/h.  3.  Thank for consultation.

## 2022-12-23 NOTE — Progress Notes (Signed)
Transition of Care [facility]) - Inpatient Brief Assessment   Patient Details  Name: Frederick Perry MRN: [id number] Date of Birth: Nov 08, 1955  Transition of Care [facility]) CM/SW Contact:    Darolyn Rua, LCSW Phone Number: 12/23/2022, 8:51 AM   Clinical Narrative:  Patient presents to ED from [facility] after seeing PCP for physical, patient has swelling in lower extremities. Patient with hx of CHF, IVC filter placed 2017. Patient is followed by Heart and Vascular care navigator.  Patient currently requiring 5L O2 in ED with no home O2 at baseline, TOC will follow for discharge planning needs.    Transition of Care Asessment: Insurance and Status: Insurance coverage has been reviewed Patient has primary care physician: Yes Home environment has been reviewed: from home Prior level of function:: independent Prior/Current Home Services: No current home services Social Determinants of Health Reivew: SDOH reviewed no interventions necessary Readmission risk has been reviewed: Yes Transition of care needs: no transition of care needs at this time

## 2022-12-23 NOTE — Consult Note (Signed)
ANTICOAGULATION CONSULT NOTE Pharmacy Consult for heparin infusion Indication: chest pain/ACS, apixaban PTA for DVT/PE  No Known Allergies  Patient Measurements: Height: 6' 1.5" (186.7 cm) Weight: 127.1 kg (280 lb 3.3 oz) IBW/kg (Calculated) : 81.06 Heparin Dosing Weight: 109.1 kg  Vital Signs: Temp: 97.6 F (36.4 C) (07/09 0659) Temp Source: Oral (07/09 0659) BP: 122/85 (07/09 1145) Pulse Rate: 65 (07/09 1145)  Labs: Recent Labs    12/22/22 1620 12/22/22 1811 12/22/22 1934 12/23/22 0129 12/23/22 0243 12/23/22 1147  HGB 14.3  --   --   --  14.3  --   HCT 45.8  --   --   --  47.8  --   PLT 274  --   --   --  239  --   APTT  --   --  35  --  132* 114*  HEPARINUNFRC  --   --  >1.10*  --   --   --   CREATININE 2.47*  --   --  2.45*  --   --   TROPONINIHS 201* 270*  --  273*  --   --      Estimated Creatinine Clearance: 42.3 mL/min (A) (by C-G formula based on SCr of 2.45 mg/dL (H)).   Medical History: Past Medical History:  Diagnosis Date   Bladder cancer (HCC)    a. 12/2015 s/p resection.   CKD (chronic kidney disease), stage II    Coronary artery disease    a. 2017 Cath: occ OM2, occ PDA (both being filled via collats), mod dzs in LAD->med rx; b. 12/2020 Cath: LM nl, LAD 70p, 26m - dLAD fills via R->L collats. RI 100 CTO - fills via collats from OM1. LCX 51m/d. OM2 100 CTO. RCA 88m, 85d. RPDA 100 - fills via collats from OM3. EF 30-35%->med rx.   HFrEF (heart failure with reduced ejection fraction) (HCC)    a. 08/2015 Echo: EF 50-55%; b. 08/2019 Echo: EF 30-35%; c. 12/2020 Echo: EF 30-35%, GrII DD. Sev apical septal, ant, inf HK. Nl RV fxn. Mild MR. Triv AI. Mild-mod Ao sclerosis.   History of DVT (deep vein thrombosis)    a. 08/2015 - s/p IVC filter; b. 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   Hyperlipidemia LDL goal <70    Hypertension    Not on medications.stopped since he ran out of medications   IDA (iron deficiency anemia)    Ischemic cardiomyopathy    a.  08/2015 Echo: EF 50-55%; b. 08/2019 Echo: EF 30-35%; c. 12/2020 Echo: EF 30-35%.   Myocardial infarction [facility])    Perforated bowel (HCC)    a. 11/2015 s/p surgical repair.   Polysubstance abuse (HCC)    a. ongoing tobacco and alcohol abuse    Pulmonary embolism (HCC)    Type II diabetes mellitus (HCC)     Medications:  PTA: apixaban 5 mg BID - last dose: 7/8 AM per patient  Assessment: 67 y.o. male  with history of ischemic cardiomyopathy, CHF, HTN, DVT/PE s/p IVC filter on Eliquis presenting to the emergency department from clinic for evaluation of hypoxia and BLLE edema. Initial troponin I 201. Pharmacy has been consulted to initiate and manage IV heparin therapy.    Baseline: Hgb 14.3, Plt 274; HL > 1.10, aPTT 35  Goal of Therapy:  Heparin level 0.3-0.7 units/ml aPTT 66-102 seconds Monitor platelets by anticoagulation protocol: Yes   07/08 0243 aPTT 132, supratherapeutic 07/08 1147 aPTT 114, supratherapeutic   Plan:  Decrease heparin infusion to 1100  units/hr Recheck aPTT level in 6 hours after rate change Transition to HL once therapeutic and correlating with aPTT Continue to monitor H&H and platelets  Barrie Folk, PharmD 12/23/2022 12:14 PM

## 2022-12-24 DIAGNOSIS — R7989 Other specified abnormal findings of blood chemistry: Secondary | ICD-10-CM

## 2022-12-24 DIAGNOSIS — J9601 Acute respiratory failure with hypoxia: Secondary | ICD-10-CM

## 2022-12-24 DIAGNOSIS — N179 Acute kidney failure, unspecified: Secondary | ICD-10-CM | POA: Diagnosis not present

## 2022-12-24 DIAGNOSIS — I25118 Atherosclerotic heart disease of native coronary artery with other forms of angina pectoris: Secondary | ICD-10-CM | POA: Diagnosis not present

## 2022-12-24 DIAGNOSIS — I5021 Acute systolic (congestive) heart failure: Secondary | ICD-10-CM | POA: Diagnosis not present

## 2022-12-24 DIAGNOSIS — I10 Essential (primary) hypertension: Secondary | ICD-10-CM

## 2022-12-24 DIAGNOSIS — I509 Heart failure, unspecified: Secondary | ICD-10-CM

## 2022-12-24 DIAGNOSIS — Z86711 Personal history of pulmonary embolism: Secondary | ICD-10-CM

## 2022-12-24 LAB — CBC
HCT: 46.3 % (ref 39.0–52.0)
Hemoglobin: 14.1 g/dL (ref 13.0–17.0)
MCH: 32 pg (ref 26.0–34.0)
MCHC: 30.5 g/dL (ref 30.0–36.0)
MCV: 105 fL — ABNORMAL HIGH (ref 80.0–100.0)
Platelets: 210 10*3/uL (ref 150–400)
RBC: 4.41 MIL/uL (ref 4.22–5.81)
RDW: 17 % — ABNORMAL HIGH (ref 11.5–15.5)
WBC: 8.1 10*3/uL (ref 4.0–10.5)
nRBC: 0.4 % — ABNORMAL HIGH (ref 0.0–0.2)

## 2022-12-24 LAB — BASIC METABOLIC PANEL
Anion gap: 11 (ref 5–15)
BUN: 41 mg/dL — ABNORMAL HIGH (ref 8–23)
CO2: 28 mmol/L (ref 22–32)
Calcium: 8.7 mg/dL — ABNORMAL LOW (ref 8.9–10.3)
Chloride: 98 mmol/L (ref 98–111)
Creatinine, Ser: 1.91 mg/dL — ABNORMAL HIGH (ref 0.61–1.24)
GFR, Estimated: 38 mL/min — ABNORMAL LOW (ref >60)
Glucose, Bld: 112 mg/dL — ABNORMAL HIGH (ref 70–99)
Potassium: 5 mmol/L (ref 3.5–5.1)
Sodium: 137 mmol/L (ref 135–145)

## 2022-12-24 LAB — APTT: aPTT: 73 seconds — ABNORMAL HIGH (ref 24–36)

## 2022-12-24 LAB — HEPARIN LEVEL (UNFRACTIONATED): Heparin Unfractionated: >1.1 IU/mL — ABNORMAL HIGH (ref 0.30–0.70)

## 2022-12-24 MED ORDER — ISOSORBIDE MONONITRATE ER 30 MG PO TB24
30.0000 mg | ORAL_TABLET | Freq: Every day | ORAL | Status: DC
  Administered 2022-12-24 – 2023-01-02 (×10): 30 mg via ORAL
  Filled 2022-12-24 (×10): qty 1

## 2022-12-24 NOTE — Consult Note (Signed)
Cardiology Consultation   Patient ID: Frederick Perry MRN: [id number]; DOB: 01-15-1956  Admit date: 12/22/2022 Date of Consult: 12/24/2022  PCP:  Enid Baas, MD   [facility] Providers Cardiologist:  Debbe Odea, MD        Patient Profile:   Frederick Perry is a 67 y.o. male with a hx of coronary artery disease, HFrEF, ischemic cardiomyopathy, hypertension, diabetes, tobacco abuse, alcohol abuse, CKD stage II-III, bladder cancer, DVT/PE status post IVC filter in 2017 (on apixaban since recurrent DVT in 2021), who is being seen 12/24/2022 for the evaluation of shortness of breath and elevated troponins at the request of Dr Meriam Sprague.  History of Present Illness:   Frederick Perry is a cardiac issue 2017 his evaluated for chest pain and found to have hemoglobin 4.9.  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  He required admission and multiple blood transfusions.  His troponin was elevated in the setting of anemia.  He was also found to have a right superficial femoral to common femoral DVT with bilateral distal branch pulmonary emboli.  An IVC filter was placed.  Echocardiogram revealed LVEF 50-55%.  Diagnostic catheterization revealed moderate LAD disease and occluded proximal OM 2 and occluded RPDA with collateral filling.  Medical therapy was advised and the patient was subsequently lost to follow-up in January 2021 when he presented with worsening lower extremity edema.  He was found to have right lower extremity DVT was placed on apixaban.  Subsequent echocardiogram in March 2021 revealed LVEF 30-25% and he was found to be with a repeat right and left heart catheterization.  Patient was again lost to follow-up until earlier this summer when he was seen in the emergency department with palpitations found to have frequent PACs.  High-sensitivity troponin was elevated at 131 and he was advised to resume his beta-blocker which he had stopped.  He was since  seen in clinic in July 2022 and arrangements were made for right/left heart catheterization which was performed on July 25 revealing severe multivessel coronary artery disease including 99% mid to distal LAD stenosis which fills with right to left collaterals, occluded ramus intermittent filled via collaterals from OM1, moderate mid to distal circumflex disease, total occlusion of the OM 2, and severe diffuse disease throughout the RCA with chronic total occlusion of the RPDA which is filled via collaterals from the OM 3.  EF is 30-25%.  There were no targets for intervention and continued medical therapy was recommended.  He was noted to have runs of PSVT in 9 sustained VT versus SVT with aberrancy which were asymptomatic.  Eliquis was restarted prior to discharge.  He was last seen in clinic 01/22/2022 remaining DOT clearance.  He had stated that he had had clearance in the past by past negative telephone encounters did not support his claims.  It was explained LVEF needs to be greater than 40%.  Patient became very frustrated during this visit he was willing to undergo a repeat echocardiogram at that time.  Unfortunately due to clearance could not be offered and he did not complete the echocardiogram.  Presented to the [facility] emergency department from PCP office on 12/22/22 with a low oxygen saturation and increasing leg swelling.  He also noticed swelling in his legs and abdomen and had some nasal congestion.  Noted increased weight gain.  He reports over the last several days that he noticed significant worsening swelling in his lower extremities.  Denies any significant  chest pain or shortness of breath.  Reported he has difficulty ambulating due to heaviness in his legs.  Does not feel like he previously had when he had a DVT.  There was approximately 30 pound weight gain over the last year and a half.  Initial vital signs: Blood pressure 134/95, pulse 100, respirations 20, temperature 98, oxygen saturation  of 88% on room air  Pertinent labs: Chloride 97, blood glucose 104, BUN 34, serum creatinine 2.57 calcium of 8.7, BNP 2212, D-dimer 6.14, high-sensitivity troponin 241 270  Imaging: Chest x-ray revealed no acute pulmonary abnormality and unchanged cardiomegaly; venous duplex of the bilateral lower extremities negative for DVT; renal ultrasound revealed no acute findings; VQ scan revealed moderate-sized areas of decreased tracer uptake in the posterior mid and lower lung fields, more so on the left side.  No discrete wedge-shaped perfusion defects are seen.  Study is inconclusive to evaluate for PE.  If clinically warranted follow-up CT pulmonary angiogram may be considered  Medications administered in the emergency department: Heparin bolus and heparin infusion, potassium chloride 40 mill equivalents, furosemide 40 mg IVP, aspirin 325 mg  Cardiology was consulted for shortness of breath and elevated troponins   Past Medical History:  Diagnosis Date   Bladder cancer (HCC)    a. 12/2015 s/p resection.   CKD (chronic kidney disease), stage II    Coronary artery disease    a. 2017 Cath: occ OM2, occ PDA (both being filled via collats), mod dzs in LAD->med rx; b. 12/2020 Cath: LM nl, LAD 70p, 75m - dLAD fills via R->L collats. RI 100 CTO - fills via collats from OM1. LCX 82m/d. OM2 100 CTO. RCA 69m, 85d. RPDA 100 - fills via collats from OM3. EF 30-35%->med rx.   HFrEF (heart failure with reduced ejection fraction) (HCC)    a. 08/2015 Echo: EF 50-55%; b. 08/2019 Echo: EF 30-35%; c. 12/2020 Echo: EF 30-35%, GrII DD. Sev apical septal, ant, inf HK. Nl RV fxn. Mild MR. Triv AI. Mild-mod Ao sclerosis.   History of DVT (deep vein thrombosis)    a. 08/2015 - s/p IVC filter; b. 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   Hyperlipidemia LDL goal <70    Hypertension    Not on medications.stopped since he ran out of medications   IDA (iron deficiency anemia)    Ischemic cardiomyopathy    a. 08/2015 Echo: EF  50-55%; b. 08/2019 Echo: EF 30-35%; c. 12/2020 Echo: EF 30-35%.   Myocardial infarction [facility])    Perforated bowel (HCC)    a. 11/2015 s/p surgical repair.   Polysubstance abuse (HCC)    a. ongoing tobacco and alcohol abuse    Pulmonary embolism (HCC)    Type II diabetes mellitus (HCC)     Past Surgical History:  Procedure Laterality Date   BACK SURGERY     CARDIAC CATHETERIZATION N/A 09/05/2015   Procedure: Left Heart Cath and Coronary Angiography;  Surgeon: Antonieta Iba, MD;  Location: [facility] INVASIVE CV LAB;  Service: Cardiovascular;  Laterality: N/A;   DRAIN REMOVAL Right 12/20/2015   Procedure: DRAIN REMOVAL;  Surgeon: Hildred Laser, MD;  Location: [facility] ORS;  Service: Urology;  Laterality: Right;  by dr copper   LAPAROTOMY N/A 12/14/2015   Procedure: EXPLORATORY LAPAROTOMY, prepyloric gastric ulcer biopsy;  Surgeon: Leafy Ro, MD;  Location: [facility] ORS;  Service: General;  Laterality: N/A;   LEFT HEART CATH AND CORONARY ANGIOGRAPHY Left 01/07/2021   Procedure: LEFT HEART CATH AND CORONARY ANGIOGRAPHY;  Surgeon:  End, Cristal Deer, MD;  Location: [facility] INVASIVE CV LAB;  Service: Cardiovascular;  Laterality: Left;   Patellar tendon repair Right    PERIPHERAL VASCULAR CATHETERIZATION N/A 09/07/2015   Procedure: IVC Filter Insertion;  Surgeon: Annice Needy, MD;  Location: [facility] INVASIVE CV LAB;  Service: Cardiovascular;  Laterality: N/A;   Testicular torsion repair     TRANSURETHRAL RESECTION OF BLADDER TUMOR N/A 12/20/2015   Procedure: TRANSURETHRAL RESECTION OF BLADDER TUMOR (TURBT);  Surgeon: Hildred Laser, MD;  Location: [facility] ORS;  Service: Urology;  Laterality: N/A;   TRANSURETHRAL RESECTION OF BLADDER TUMOR N/A 03/26/2016   Procedure: TRANSURETHRAL RESECTION OF BLADDER TUMOR (TURBT);  Surgeon: Hildred Laser, MD;  Location: [facility] ORS;  Service: Urology;  Laterality: N/A;     Home Medications:  Prior to Admission medications   Medication Sig Start Date End Date Taking?  Authorizing Provider  apixaban (ELIQUIS) 5 MG TABS tablet Take 1 tablet (5 mg total) by mouth 2 (two) times daily. 04/04/21  Yes Debbe Odea, MD  aspirin EC 81 MG tablet Take 81 mg by mouth daily.   Yes [provider]  atorvastatin (LIPITOR) 40 MG tablet Take 1 tablet (40 mg total) by mouth daily. 12/09/22 03/09/23 Yes Gollan, Tollie Pizza, MD  carvedilol (COREG) 6.25 MG tablet Take 1 tablet (6.25 mg total) by mouth 2 (two) times daily. 12/09/22 03/09/23 Yes Gollan, Tollie Pizza, MD  cetirizine (ZYRTEC) 10 MG tablet Take 10 mg by mouth daily as needed for allergies.   Yes [provider]  hydrALAZINE (APRESOLINE) 25 MG tablet Take 1.5 tablets (37.5 mg total) by mouth 3 (three) times daily. 12/09/22 03/09/23 Yes Gollan, Tollie Pizza, MD  isosorbide dinitrate (ISORDIL) 20 MG tablet TAKE 1 TABLET BY MOUTH THREE TIMES A DAY 04/26/21  Yes Creig Hines, NP  torsemide (DEMADEX) 20 MG tablet Take 1 tablet (20 mg total) by mouth daily. 01/10/21  Yes Wieting, Richard, MD  nicotine (NICODERM CQ - DOSED IN MG/24 HOURS) 21 mg/24hr patch One 21 patch chest wall daily (okay to substitute generic) Patient not taking: Reported on 01/22/2022 01/08/21   Alford Highland, MD    Inpatient Medications: Scheduled Meds:  aspirin EC  81 mg Oral Daily   atorvastatin  40 mg Oral QHS   hydrALAZINE  37.5 mg Oral TID   Continuous Infusions:  furosemide (LASIX) 200 mg in dextrose 5 % 100 mL (2 mg/mL) infusion 8 mg/hr (12/24/22 0958)   heparin 1,100 Units/hr (12/23/22 1550)   PRN Meds: acetaminophen **OR** acetaminophen, hydrALAZINE, ondansetron **OR** ondansetron (ZOFRAN) IV, senna-docusate  Allergies:   No Known Allergies  Social History:   Social History   Socioeconomic History   Marital status: Divorced    Spouse name: Not on file   Number of children: Not on file   Years of education: Not on file   Highest education level: Not on file  Occupational History   Not on file  Tobacco Use    Smoking status: Every Day    Packs/day: 0.50    Years: 40.00    Additional pack years: 0.00    Total pack years: 20.00    Types: Cigarettes   Smokeless tobacco: Never  Vaping Use   Vaping Use: Never used  Substance and Sexual Activity   Alcohol use: Yes    Alcohol/week: 10.0 standard drinks of alcohol    Types: 10 Cans of beer per week    Comment: weekly- beers and occasionally liquor   Drug use: No  Sexual activity: Not on file  Other Topics Concern   Not on file  Social History Narrative   Lives at home with Mother and is his Mothers primary caregiver   Social Determinants of Health   Financial Resource Strain: Medium Risk (01/09/2021)   Overall Financial Resource Strain (CARDIA)    Difficulty of Paying Living Expenses: Somewhat hard  Food Insecurity: No Food Insecurity (12/23/2022)   Hunger Vital Sign    Worried About Running Out of Food in the Last Year: Never true    Ran Out of Food in the Last Year: Never true  Transportation Needs: No Transportation Needs (12/23/2022)   PRAPARE - Administrator, Civil Service (Medical): No    Lack of Transportation (Non-Medical): No  Physical Activity: Unknown (02/04/2018)   Exercise Vital Sign    Days of Exercise per Week: Patient declined    Minutes of Exercise per Session: Patient declined  Stress: Not on file  Social Connections: Unknown (02/04/2018)   Social Connection and Isolation Panel [NHANES]    Frequency of Communication with Friends and Family: Patient declined    Frequency of Social Gatherings with Friends and Family: Patient declined    Attends Religious Services: Patient declined    Database administrator or Organizations: Patient declined    Attends Banker Meetings: Patient declined    Marital Status: Patient declined  Intimate Partner Violence: Not At Risk (12/23/2022)   Humiliation, Afraid, Rape, and Kick questionnaire    Fear of Current or Ex-Partner: No    Emotionally Abused: No     Physically Abused: No    Sexually Abused: No    Family History:    Family History  Problem Relation Age of Onset   Congestive Heart Failure Mother    Peripheral vascular disease Father      ROS:  Please see the history of present illness.  Review of Systems  Constitutional:  Positive for malaise/fatigue.  Respiratory:  Positive for cough and shortness of breath.   Cardiovascular:  Positive for leg swelling.  Gastrointestinal:  Positive for abdominal pain.    All other ROS reviewed and negative.     Physical Exam/Data:   Vitals:   12/23/22 2028 12/23/22 2322 12/24/22 0429 12/24/22 0837  BP: 130/61 136/85 139/78 115/86  Pulse: 67 74 76 92  Resp: 19 20 20 20   Temp: 99.1 F (37.3 C) 99 F (37.2 C) 99.6 F (37.6 C) 98.3 F (36.8 C)  TempSrc:      SpO2: 94% 96% 96% 93%  Weight:      Height:        Intake/Output Summary (Last 24 hours) at 12/24/2022 1158 Last data filed at 12/24/2022 0348 Gross per 24 hour  Intake 643.01 ml  Output 650 ml  Net -6.99 ml      12/22/2022    7:00 PM 01/22/2022    2:53 PM 02/06/2021   10:41 AM  Last 3 Weights  Weight (lbs) 280 lb 3.3 oz 250 lb 4 oz 245 lb  Weight (kg) 127.1 kg 113.513 kg 111.131 kg     Body mass index is 36.46 kg/m.  General:  Well nourished, well developed, in no acute distress HEENT: normal Neck: no JVD Vascular: No carotid bruits; Distal pulses 2+ bilaterally Cardiac:  normal S1, S2; RRR; no murmur  Lungs:  clear to auscultation bilaterally, no wheezing, rhonchi or rales  Abd: soft, nontender, distended, BS x 4 quadrants no hepatomegaly  Ext: 2-3+ to mid  thigh edema Musculoskeletal:  No deformities, BUE and BLE strength normal and equal Skin: warm and dry  Neuro:  CNs 2-12 intact, no focal abnormalities noted Psych:  Normal affect   EKG:  The EKG was personally reviewed and demonstrates: Sinus rhythm with PVCs with ST depression T wave inversion in diffuse leads Telemetry:  Telemetry was personally reviewed and  demonstrates: Sinus rhythm with PACs rates in the 70s with ST depression and T wave inversions  Relevant CV Studies: Echo 01/07/21  1. Left ventricular ejection fraction, by estimation, is 30 to 35%. The  left ventricle has moderately decreased function. The left ventricle  demonstrates regional wall motion abnormalities (see scoring  diagram/findings for description). The left  ventricular internal cavity size was mildly dilated. There is mild left  ventricular hypertrophy. Left ventricular diastolic parameters are  consistent with Grade II diastolic dysfunction (pseudonormalization).  Elevated left atrial pressure. There is  severe hypokinesis of the left ventricular, apical septal wall, anterior  wall, inferior wall and anterior segment.   2. Right ventricular systolic function is normal. The right ventricular  size is mildly enlarged. Mildly increased right ventricular wall  thickness.   3. The mitral valve is degenerative. Mild mitral valve regurgitation. No  evidence of mitral stenosis.   4. The aortic valve is tricuspid. There is mild calcification of the  aortic valve. There is mild thickening of the aortic valve. Aortic valve  regurgitation is trivial. Mild to moderate aortic valve  sclerosis/calcification is present, without any  evidence of aortic stenosis.    Cardiac cath 12/2020 Conclusions: Severe three-vessel coronary artery disease, as detailed below, including chronic subtotal occlusion of the mid LAD, chronic total occlusions of ostial ramus intermedius, OM2, and RPDA.  There is also moderate to severe stenosis involving the mid LCx and mid/distal RCA. Severely reduced left ventricular systolic function (LVEF 25-35%) with moderately to severely reduced filling pressure (LVEDP 30-35 mmHg).   Recommendations: Admit for diuresis and optimization of goal-directed medical therapy for acute on chronic HFrEF due to ischemic cardiomyopathy. If no evidence of bleeding or  vascular injury at right radial arteriotomy site, apixaban can be restarted tomorrow morning. I do not see any good targets for percutaneous or surgical revascularization at this time.  If LVEF does not improve with GDMT, viability study could be performed to see if there would be any potential myocardium that would improve with high risk intervention. Aggressive secondary prevention.   Yvonne Kendall, MD [facility]   Coronary Diagrams   Diagnostic Dominance: Right        Echo 08/2019 1. Left ventricular ejection fraction, by estimation, is 30 to 35%. The  left ventricle has moderately decreased function. The left ventricle  demonstrates global hypokinesis, with severe hypokinesis of the inferior  and inferolateral regions. Left  ventricular diastolic parameters are consistent with Grade I diastolic  dysfunction (impaired relaxation).   2. Right ventricular systolic function is low normal. The right  ventricular size is normal. Tricuspid regurgitation signal is inadequate  for assessing PA pressure.   3. Left atrial size was mildly dilated.   Laboratory Data:  High Sensitivity Troponin:   Recent Labs  Lab 12/22/22 1620 12/22/22 1811 12/23/22 0129  TROPONINIHS 201* 270* 273*     Chemistry Recent Labs  Lab 12/22/22 1620 12/23/22 0129 12/24/22 0443  NA 135 135 137  K 4.8 5.1 5.0  CL 97* 98 98  CO2 26 30 28   GLUCOSE 104* 138* 112*  BUN 34* 39* 41*  CREATININE 2.47* 2.45* 1.91*  CALCIUM 8.7* 8.5* 8.7*  GFRNONAA 28* 28* 38*  ANIONGAP 12 7 11     No results for input(s): "PROT", "ALBUMIN", "AST", "ALT", "ALKPHOS", "BILITOT" in the last 168 hours. Lipids No results for input(s): "CHOL", "TRIG", "HDL", "LABVLDL", "LDLCALC", "CHOLHDL" in the last 168 hours.  Hematology Recent Labs  Lab 12/22/22 1620 12/23/22 0243 12/24/22 0443  WBC 5.6 7.7 8.1  RBC 4.57 4.62 4.41  HGB 14.3 14.3 14.1  HCT 45.8 47.8 46.3  MCV 100.2* 103.5* 105.0*  MCH 31.3 31.0 32.0  MCHC  31.2 29.9* 30.5  RDW 17.2* 17.2* 17.0*  PLT 274 239 210   Thyroid No results for input(s): "TSH", "FREET4" in the last 168 hours.  BNP Recent Labs  Lab 12/22/22 1620  BNP 2,212.1*    DDimer  Recent Labs  Lab 12/22/22 1659  DDIMER 6.14*     Radiology/Studies:  NM Pulmonary Perfusion  Result Date: 12/23/2022 CLINICAL DATA:  Hypoxia, elevated D-dimer, clinical suspicion for PE EXAM: NUCLEAR MEDICINE PERFUSION LUNG SCAN TECHNIQUE: Perfusion images were obtained in multiple projections after intravenous injection of radiopharmaceutical. Ventilation scans intentionally deferred if perfusion scan and chest x-ray adequate for interpretation during COVID 19 epidemic. RADIOPHARMACEUTICALS:  4.25 mCi Tc-52m MAA IV COMPARISON:  Chest radiographs none on 12/22/2022 FINDINGS: There are no discrete wedge-shaped perfusion defects. There are moderate-sized nonsegmental foci off decreased tracer uptake in the posterior mid and lower lung fields, more so on the left side. Evaluation is limited without ventilation images. There are no focal infiltrates in the lung fields in the areas of decreased perfusion. IMPRESSION: There are moderate sized areas of decreased tracer uptake in the posterior mid and lower lung fields, more so on the left side. No discrete wedge-shaped perfusion defects are seen.Study is inconclusive to evaluate for PE. If clinically warranted, follow-up CT pulmonary angiogram may be considered. Electronically Signed   By: Ernie Avena M.D.   On: 12/23/2022 17:43   US RENAL  Result Date: 12/23/2022 CLINICAL DATA:  Acute kidney failure EXAM: RENAL / URINARY TRACT ULTRASOUND COMPLETE COMPARISON:  CT angiogram abdomen and pelvis December 14, 2015 FINDINGS: Right Kidney: Renal measurements: 10.7 x 5.6 x 5.8 cm = volume: 182.0 mL. Echogenicity within normal limits. No mass or hydronephrosis visualized. Left Kidney: Renal measurements: 11.3 x 6.3 x 5.3 cm = volume: 198.1 mL. Echogenicity within  normal limits. No hydronephrosis. There is an exophytic benign cyst originating from the mid-pole, measuring 3.4 x 2.3 x 3.1 cm. Bladder: Appears normal for degree of bladder distention. Other: None. IMPRESSION: No acute findings. Electronically Signed   By: Jacob Moores M.D.   On: 12/23/2022 12:21   ECHOCARDIOGRAM COMPLETE  Result Date: 12/23/2022    ECHOCARDIOGRAM REPORT   Patient Name:   JA OHMAN Date of Exam: 12/22/2022 Medical Rec #:  [id number]         Height:       73.5 in Accession #:    [id number]        Weight:       280.2 lb Date of Birth:  May 27, 1956        BSA:          2.496 m Patient Age:    66 years          BP:           122/75 mmHg Patient Gender: M                 HR:  69 bpm. Exam Location:  [facility] Procedure: 2D Echo, Cardiac Doppler, Color Doppler and Intracardiac            Opacification Agent Indications:     R06.00 Dyspnea  History:         Patient has prior history of Echocardiogram examinations, most                  recent 01/07/2021. CAD and Previous Myocardial Infarction; Risk                  Factors:Diabetes, Hypertension and Dyslipidemia. Ischemic                  cardiomyopathy. Pulmonary embolism. Chronic kidney disease.  Sonographer:     Daphine Deutscher RDCS Referring Phys:  [id number] AMY N [facility] Diagnosing Phys: Yvonne Kendall MD IMPRESSIONS  1. Left ventricular ejection fraction, by estimation, is 50 to 55%. The left ventricle has low normal function. The left ventricle demonstrates regional wall motion abnormalities (see scoring diagram/findings for description). Left ventricular diastolic  parameters are consistent with Grade II diastolic dysfunction (pseudonormalization). Elevated left atrial pressure.  2. Right ventricular systolic function is mildly reduced. The right ventricular size is mildly enlarged. There is mildly elevated pulmonary artery systolic pressure.  3. Left atrial size was mildly dilated.  4. Right atrial size was mildly dilated.  5.  The mitral valve is abnormal. Mild mitral valve regurgitation. No evidence of mitral stenosis. There is mild late systolic prolapse of posterior leaflet of the mitral valve. Cannot exclude a ruptured chord.  6. The aortic valve is tricuspid. There is moderate calcification of the aortic valve. There is moderate thickening of the aortic valve. Aortic valve regurgitation is not visualized. Aortic valve sclerosis/calcification is present, without any evidence of aortic stenosis.  7. The inferior vena cava is dilated in size with <50% respiratory variability, suggesting right atrial pressure of 15 mmHg. FINDINGS  Left Ventricle: Left ventricular ejection fraction, by estimation, is 50 to 55%. The left ventricle has low normal function. The left ventricle demonstrates regional wall motion abnormalities. Definity contrast agent was given IV to delineate the left ventricular endocardial borders. The left ventricular internal cavity size was normal in size. There is no left ventricular hypertrophy. Left ventricular diastolic parameters are consistent with Grade II diastolic dysfunction (pseudonormalization). Elevated left atrial pressure.  LV Wall Scoring: The basal inferolateral segment, apical lateral segment, apical anterior segment, and apex are hypokinetic. The anterior wall, antero-lateral wall, entire septum, entire inferior wall, and mid inferolateral segment are normal. Right Ventricle: The right ventricular size is mildly enlarged. No increase in right ventricular wall thickness. Right ventricular systolic function is mildly reduced. There is mildly elevated pulmonary artery systolic pressure. The tricuspid regurgitant  velocity is 2.41 m/s, and with an assumed right atrial pressure of 15 mmHg, the estimated right ventricular systolic pressure is 38.2 mmHg. Left Atrium: Left atrial size was mildly dilated. Right Atrium: Right atrial size was mildly dilated. Pericardium: The pericardium was not well visualized.  Mitral Valve: The mitral valve is abnormal. There is mild late systolic prolapse of posterior leaflet of the mitral valve. There is mild thickening of the mitral valve leaflet(s). Mild mitral valve regurgitation. No evidence of mitral valve stenosis. Tricuspid Valve: The tricuspid valve is normal in structure. Tricuspid valve regurgitation is mild. Aortic Valve: The aortic valve is tricuspid. There is moderate calcification of the aortic valve. There is moderate thickening of the aortic valve. Aortic valve regurgitation is  not visualized. Aortic valve sclerosis/calcification is present, without any  evidence of aortic stenosis. Aortic valve mean gradient measures 5.5 mmHg. Aortic valve peak gradient measures 10.1 mmHg. Aortic valve area, by VTI measures 2.97 cm. Pulmonic Valve: The pulmonic valve was normal in structure. Pulmonic valve regurgitation is not visualized. No evidence of pulmonic stenosis. Aorta: The aortic root and ascending aorta are structurally normal, with no evidence of dilitation. Pulmonary Artery: The pulmonary artery is of normal size. Venous: The inferior vena cava is dilated in size with less than 50% respiratory variability, suggesting right atrial pressure of 15 mmHg. IAS/Shunts: The interatrial septum was not well visualized.  LEFT VENTRICLE PLAX 2D LVIDd:         5.40 cm      Diastology LVIDs:         4.00 cm      LV e' medial:    4.75 cm/s LV PW:         1.00 cm      LV E/e' medial:  18.8 LV IVS:        1.00 cm      LV e' lateral:   7.47 cm/s LVOT diam:     2.20 cm      LV E/e' lateral: 12.0 LV SV:         95 LV SV Index:   38 LVOT Area:     3.80 cm  LV Volumes (MOD) LV vol d, MOD A2C: 118.3 ml LV vol d, MOD A4C: 122.7 ml LV vol s, MOD A2C: 40.5 ml LV vol s, MOD A4C: 64.6 ml LV SV MOD A2C:     77.8 ml LV SV MOD A4C:     122.7 ml LV SV MOD BP:      70.5 ml RIGHT VENTRICLE             IVC RV Basal diam:  4.60 cm     IVC diam: 2.80 cm RV S prime:     10.10 cm/s TAPSE (M-mode): 1.9 cm LEFT  ATRIUM              Index        RIGHT ATRIUM           Index LA diam:        5.20 cm  2.08 cm/m   RA Area:     28.00 cm LA Vol (A2C):   109.0 ml 43.67 ml/m  RA Volume:   100.00 ml 40.07 ml/m LA Vol (A4C):   66.2 ml  26.52 ml/m LA Biplane Vol: 87.2 ml  34.94 ml/m  AORTIC VALVE AV Area (Vmax):    3.30 cm AV Area (Vmean):   3.04 cm AV Area (VTI):     2.97 cm AV Vmax:           159.13 cm/s AV Vmean:          110.579 cm/s AV VTI:            0.319 m AV Peak Grad:      10.1 mmHg AV Mean Grad:      5.5 mmHg LVOT Vmax:         138.00 cm/s LVOT Vmean:        88.350 cm/s LVOT VTI:          0.250 m LVOT/AV VTI ratio: 0.78  AORTA Ao Root diam: 3.20 cm Ao Asc diam:  3.40 cm MITRAL VALVE  TRICUSPID VALVE MV Area (PHT): 3.62 cm    TR Peak grad:   23.2 mmHg MV Decel Time: 210 msec    TR Vmax:        241.00 cm/s MV E velocity: 89.50 cm/s MV A velocity: 76.55 cm/s  SHUNTS MV E/A ratio:  1.17        Systemic VTI:  0.25 m                            Systemic Diam: 2.20 cm Yvonne Kendall MD Electronically signed by Yvonne Kendall MD Signature Date/Time: 12/23/2022/10:30:45 AM    Final    US Venous Img Lower Bilateral (DVT)  Result Date: 12/23/2022 CLINICAL DATA:  Leg swelling EXAM: BILATERAL LOWER EXTREMITY VENOUS DOPPLER ULTRASOUND TECHNIQUE: Gray-scale sonography with compression, as well as color and duplex ultrasound, were performed to evaluate the deep venous system(s) from the level of the common femoral vein through the popliteal and proximal calf veins. COMPARISON:  07/12/2019 FINDINGS: VENOUS Normal compressibility of the common femoral, superficial femoral, and popliteal veins, as well as the visualized calf veins. Visualized portions of profunda femoral vein and great saphenous vein unremarkable. No filling defects to suggest DVT on grayscale or color Doppler imaging. Doppler waveforms show normal direction of venous flow, normal respiratory plasticity and response to augmentation. OTHER None.  Limitations: none IMPRESSION: Negative. Electronically Signed   By: Charlett Nose M.D.   On: 12/23/2022 01:35   DG Chest 2 View  Result Date: 12/22/2022 CLINICAL DATA:  Shortness of breath EXAM: CHEST - 2 VIEW COMPARISON:  Chest x-ray January 01, 2021 FINDINGS: Unchanged cardiomegaly and mediastinal contours. No focal pulmonary opacity. No pleural effusion or pneumothorax. The visualized upper abdomen is unremarkable. No acute osseous abnormality. IMPRESSION: 1. No acute pulmonary abnormality. 2. Unchanged cardiomegaly. Electronically Signed   By: Jacob Moores M.D.   On: 12/22/2022 16:50     Assessment and Plan:   HFrEF -Presented with worsening shortness of breath, leg edema, anasarca, and 30 pound plus weight gain -BNP 2212 -On Lasix infusion increased to 8 mg/h from 5 mg per - -557 cc output, question accurate I's and O's -Echocardiogram with LVEF 50-55%, G2 DD, mild mitral valve regurgitation -Heart failure education -Slowly reincorporate home medication regimen of carvedilol 6.25 mg twice daily, Bidil, and adjustments of torsemide once off furosemide drip -Previous changes in escalation of GDMT limited by affordability -Daily weights, I's and O's, low-sodium diet  Acute hypoxic respiratory failure -Continued with shortness of breath -Oxygen saturations are maintained on oxygen via nasal cannula -Bilateral ultrasound of the lower extremities negative for DVT -VQ scan inconclusive with markedly elevated D-dimer -Chest x-ray was unremarkable -Continued management by IM  Acute kidney injury -Serum creatinine 1.91 -Improvement from yesterday 2.45 -Continues to improve with diuresis likely component of cardiorenal -Renal ultrasound negative -Continued on furosemide infusion -Followed by nephrology as well -Daily BMP -Monitor urine output -Monitor/trend/replete electrolytes as needed -Avoid nephrotoxic agents were able  Coronary artery disease with elevated high-sensitivity  troponins -Currently remains chest pain-free -Last heart catheterization in 12/2020 advised medical treatment with aggressive secondary prevention -Continue on aspirin 81 mg daily -Continue on statin atorvastatin 40 mg daily -Commend restarting Imdur -No ischemic changes noted on telemetry -EKG as needed for pain or changes  Essential hypertension -Blood pressure 115/86 -Continued on hydralazine 37.5 mg 3 times daily -Recommended restarting Imdur 20 mg 3 times daily of the combination of the 2 make BiDil  for his heart failure -Currently also remains on Lasix drip that has been increased from 5 mg to 8 mg/h -Vital signs per unit protocol  Hyperlipidemia -Continued on atorvastatin 40 mg  History of PE/DVT -History of IVC filter from 2017 that has never been removed -Continued on apixaban, endorses compliance, last dose 12/22/2022 -D-dimer 6.14 -VQ scan inconclusive -CT of the chest for PE not ordered due to acute kidney injury -Continued on heparin infusion and lieu of apixaban  Morbid obesity -BMI 36.46 -Complicates overall care and prognosis   Risk Assessment/Risk Scores:        New York Heart Association (NYHA) Functional Class NYHA Class III        For questions or updates, please contact [facility] Please consult [email or web address] for contact info under    Signed, Anthony Tamburo, NP  12/24/2022 11:58 AM

## 2022-12-24 NOTE — Progress Notes (Signed)
Patient has urinated in the bed. Attempted to change patients bedding and gown. Patient is refusing. Patient states it is RN fault that he has urinated on himself. Patient has been resting most of the shift. Patient yelled at nurse 2117 stating that staff waited to long to give medications. Patient was asleep before 2100. Patient is disoriented to the facts that have occurred throughout the shift.

## 2022-12-24 NOTE — Progress Notes (Signed)
[facility]  ROUNDING NOTE   Subjective:   Patient seen laying in bed Alert, mild agitation noted Patient states he did not rest well overnight  Furosemide drip in place Remains on 5 L nasal cannula Lower extremity edema present  Objective:  Vital signs in last 24 hours:  Temp:  [97.5 F (36.4 C)-99.6 F (37.6 C)] 97.6 F (36.4 C) (07/10 1249) Pulse Rate:  [64-92] 85 (07/10 1249) Resp:  [15-20] 20 (07/10 1249) BP: (115-213)/(61-198) 137/88 (07/10 1249) SpO2:  [91 %-100 %] 91 % (07/10 1249)  Weight change:  Filed Weights   12/22/22 1900  Weight: 127.1 kg    Intake/Output: I/O last 3 completed shifts: In: 643 [P.O.:240; I.V.:403] Out: 1200 [Urine:1200]   Intake/Output this shift:  No intake/output data recorded.  Physical Exam: General: NAD  Head: Normocephalic, atraumatic. Moist oral mucosal membranes  Eyes: Anicteric  Lungs:  Clear to auscultation, NCO 2  Heart: Regular rate and rhythm  Abdomen:  Soft, nontender, obese  Extremities: 2+ peripheral edema.  Neurologic: Nonfocal, moving all four extremities  Skin: No lesions  Access: None    Basic Metabolic Panel: Recent Labs  Lab 12/22/22 1620 12/23/22 0129 12/24/22 0443  NA 135 135 137  K 4.8 5.1 5.0  CL 97* 98 98  CO2 26 30 28   GLUCOSE 104* 138* 112*  BUN 34* 39* 41*  CREATININE 2.47* 2.45* 1.91*  CALCIUM 8.7* 8.5* 8.7*    Liver Function Tests: No results for input(s): "AST", "ALT", "ALKPHOS", "BILITOT", "PROT", "ALBUMIN" in the last 168 hours. No results for input(s): "LIPASE", "AMYLASE" in the last 168 hours. No results for input(s): "AMMONIA" in the last 168 hours.  CBC: Recent Labs  Lab 12/22/22 1620 12/23/22 0243 12/24/22 0443  WBC 5.6 7.7 8.1  HGB 14.3 14.3 14.1  HCT 45.8 47.8 46.3  MCV 100.2* 103.5* 105.0*  PLT 274 239 210    Cardiac Enzymes: No results for input(s): "CKTOTAL", "CKMB", "CKMBINDEX", "TROPONINI" in the last 168 hours.  BNP: Invalid input(s):  "POCBNP"  CBG: No results for input(s): "GLUCAP" in the last 168 hours.  Microbiology: Results for orders placed or performed during the hospital encounter of 01/01/21  Resp Panel by RT-PCR (Flu A&B, Covid) Nasopharyngeal Swab     Status: None   Collection Time: 01/01/21  4:57 PM   Specimen: Nasopharyngeal Swab; Nasopharyngeal(NP) swabs in vial transport medium  Result Value Ref Range Status   SARS Coronavirus 2 by RT PCR NEGATIVE NEGATIVE Final    Comment: (NOTE) SARS-CoV-2 target nucleic acids are NOT DETECTED.  The SARS-CoV-2 RNA is generally detectable in upper respiratory specimens during the acute phase of infection. The lowest concentration of SARS-CoV-2 viral copies this assay can detect is 138 copies/mL. A negative result does not preclude SARS-Cov-2 infection and should not be used as the sole basis for treatment or other patient management decisions. A negative result may occur with  improper specimen collection/handling, submission of specimen other than nasopharyngeal swab, presence of viral mutation(s) within the areas targeted by this assay, and inadequate number of viral copies(<138 copies/mL). A negative result must be combined with clinical observations, patient history, and epidemiological information. The expected result is Negative.  Fact Sheet for Patients:  BloggerCourse.com  Fact Sheet for Healthcare Providers:  SeriousBroker.it  This test is no t yet approved or cleared by the [place] FDA and  has been authorized for detection and/or diagnosis of SARS-CoV-2 by FDA under an Emergency Use Authorization (EUA). This EUA will  remain  in effect (meaning this test can be used) for the duration of the COVID-19 declaration under Section 564(b)(1) of the Act, 21 U.S.C.section 360bbb-3(b)(1), unless the authorization is terminated  or revoked sooner.       Influenza A by PCR NEGATIVE NEGATIVE Final    Influenza B by PCR NEGATIVE NEGATIVE Final    Comment: (NOTE) The Xpert Xpress SARS-CoV-2/FLU/RSV plus assay is intended as an aid in the diagnosis of influenza from Nasopharyngeal swab specimens and should not be used as a sole basis for treatment. Nasal washings and aspirates are unacceptable for Xpert Xpress SARS-CoV-2/FLU/RSV testing.  Fact Sheet for Patients: BloggerCourse.com  Fact Sheet for Healthcare Providers: SeriousBroker.it  This test is not yet approved or cleared by the [place] FDA and has been authorized for detection and/or diagnosis of SARS-CoV-2 by FDA under an Emergency Use Authorization (EUA). This EUA will remain in effect (meaning this test can be used) for the duration of the COVID-19 declaration under Section 564(b)(1) of the Act, 21 U.S.C. section 360bbb-3(b)(1), unless the authorization is terminated or revoked.  Performed at [facility], [address] [place], [place] [address]     Coagulation Studies: No results for input(s): "LABPROT", "INR" in the last 72 hours.  Urinalysis: Recent Labs    12/23/22 0650  COLORURINE AMBER*  LABSPEC 1.006  PHURINE 6.0  GLUCOSEU NEGATIVE  HGBUR MODERATE*  BILIRUBINUR NEGATIVE  KETONESUR NEGATIVE  PROTEINUR 100*  NITRITE NEGATIVE  LEUKOCYTESUR SMALL*      Imaging: NM Pulmonary Perfusion  Result Date: 12/23/2022 CLINICAL DATA:  Hypoxia, elevated D-dimer, clinical suspicion for PE EXAM: NUCLEAR MEDICINE PERFUSION LUNG SCAN TECHNIQUE: Perfusion images were obtained in multiple projections after intravenous injection of radiopharmaceutical. Ventilation scans intentionally deferred if perfusion scan and chest x-ray adequate for interpretation during COVID 19 epidemic. RADIOPHARMACEUTICALS:  4.25 mCi Tc-26m MAA IV COMPARISON:  Chest radiographs none on 12/22/2022 FINDINGS: There are no discrete wedge-shaped perfusion defects. There are  moderate-sized nonsegmental foci off decreased tracer uptake in the posterior mid and lower lung fields, more so on the left side. Evaluation is limited without ventilation images. There are no focal infiltrates in the lung fields in the areas of decreased perfusion. IMPRESSION: There are moderate sized areas of decreased tracer uptake in the posterior mid and lower lung fields, more so on the left side. No discrete wedge-shaped perfusion defects are seen.Study is inconclusive to evaluate for PE. If clinically warranted, follow-up CT pulmonary angiogram may be considered. Electronically Signed   By: Ernie Avena M.D.   On: 12/23/2022 17:43   US RENAL  Result Date: 12/23/2022 CLINICAL DATA:  Acute kidney failure EXAM: RENAL / URINARY TRACT ULTRASOUND COMPLETE COMPARISON:  CT angiogram abdomen and pelvis December 14, 2015 FINDINGS: Right Kidney: Renal measurements: 10.7 x 5.6 x 5.8 cm = volume: 182.0 mL. Echogenicity within normal limits. No mass or hydronephrosis visualized. Left Kidney: Renal measurements: 11.3 x 6.3 x 5.3 cm = volume: 198.1 mL. Echogenicity within normal limits. No hydronephrosis. There is an exophytic benign cyst originating from the mid-pole, measuring 3.4 x 2.3 x 3.1 cm. Bladder: Appears normal for degree of bladder distention. Other: None. IMPRESSION: No acute findings. Electronically Signed   By: Jacob Moores M.D.   On: 12/23/2022 12:21   ECHOCARDIOGRAM COMPLETE  Result Date: 12/23/2022    ECHOCARDIOGRAM REPORT   Patient Name:   Frederick Perry Date of Exam: 12/22/2022 Medical Rec #:  [id number]  Height:       73.5 in Accession #:    [id number]        Weight:       280.2 lb Date of Birth:  29-Dec-1955        BSA:          2.496 m Patient Age:    66 years          BP:           122/75 mmHg Patient Gender: M                 HR:           69 bpm. Exam Location:  [facility] Procedure: 2D Echo, Cardiac Doppler, Color Doppler and Intracardiac            Opacification Agent Indications:      R06.00 Dyspnea  History:         Patient has prior history of Echocardiogram examinations, most                  recent 01/07/2021. CAD and Previous Myocardial Infarction; Risk                  Factors:Diabetes, Hypertension and Dyslipidemia. Ischemic                  cardiomyopathy. Pulmonary embolism. Chronic kidney disease.  Sonographer:     Daphine Deutscher RDCS Referring Phys:  [id number] AMY N [facility] Diagnosing Phys: Yvonne Kendall MD IMPRESSIONS  1. Left ventricular ejection fraction, by estimation, is 50 to 55%. The left ventricle has low normal function. The left ventricle demonstrates regional wall motion abnormalities (see scoring diagram/findings for description). Left ventricular diastolic  parameters are consistent with Grade II diastolic dysfunction (pseudonormalization). Elevated left atrial pressure.  2. Right ventricular systolic function is mildly reduced. The right ventricular size is mildly enlarged. There is mildly elevated pulmonary artery systolic pressure.  3. Left atrial size was mildly dilated.  4. Right atrial size was mildly dilated.  5. The mitral valve is abnormal. Mild mitral valve regurgitation. No evidence of mitral stenosis. There is mild late systolic prolapse of posterior leaflet of the mitral valve. Cannot exclude a ruptured chord.  6. The aortic valve is tricuspid. There is moderate calcification of the aortic valve. There is moderate thickening of the aortic valve. Aortic valve regurgitation is not visualized. Aortic valve sclerosis/calcification is present, without any evidence of aortic stenosis.  7. The inferior vena cava is dilated in size with <50% respiratory variability, suggesting right atrial pressure of 15 mmHg. FINDINGS  Left Ventricle: Left ventricular ejection fraction, by estimation, is 50 to 55%. The left ventricle has low normal function. The left ventricle demonstrates regional wall motion abnormalities. Definity contrast agent was given IV to delineate the  left ventricular endocardial borders. The left ventricular internal cavity size was normal in size. There is no left ventricular hypertrophy. Left ventricular diastolic parameters are consistent with Grade II diastolic dysfunction (pseudonormalization). Elevated left atrial pressure.  LV Wall Scoring: The basal inferolateral segment, apical lateral segment, apical anterior segment, and apex are hypokinetic. The anterior wall, antero-lateral wall, entire septum, entire inferior wall, and mid inferolateral segment are normal. Right Ventricle: The right ventricular size is mildly enlarged. No increase in right ventricular wall thickness. Right ventricular systolic function is mildly reduced. There is mildly elevated pulmonary artery systolic pressure. The tricuspid regurgitant  velocity is 2.41 m/s, and with an assumed right atrial pressure of  15 mmHg, the estimated right ventricular systolic pressure is 38.2 mmHg. Left Atrium: Left atrial size was mildly dilated. Right Atrium: Right atrial size was mildly dilated. Pericardium: The pericardium was not well visualized. Mitral Valve: The mitral valve is abnormal. There is mild late systolic prolapse of posterior leaflet of the mitral valve. There is mild thickening of the mitral valve leaflet(s). Mild mitral valve regurgitation. No evidence of mitral valve stenosis. Tricuspid Valve: The tricuspid valve is normal in structure. Tricuspid valve regurgitation is mild. Aortic Valve: The aortic valve is tricuspid. There is moderate calcification of the aortic valve. There is moderate thickening of the aortic valve. Aortic valve regurgitation is not visualized. Aortic valve sclerosis/calcification is present, without any  evidence of aortic stenosis. Aortic valve mean gradient measures 5.5 mmHg. Aortic valve peak gradient measures 10.1 mmHg. Aortic valve area, by VTI measures 2.97 cm. Pulmonic Valve: The pulmonic valve was normal in structure. Pulmonic valve regurgitation is  not visualized. No evidence of pulmonic stenosis. Aorta: The aortic root and ascending aorta are structurally normal, with no evidence of dilitation. Pulmonary Artery: The pulmonary artery is of normal size. Venous: The inferior vena cava is dilated in size with less than 50% respiratory variability, suggesting right atrial pressure of 15 mmHg. IAS/Shunts: The interatrial septum was not well visualized.  LEFT VENTRICLE PLAX 2D LVIDd:         5.40 cm      Diastology LVIDs:         4.00 cm      LV e' medial:    4.75 cm/s LV PW:         1.00 cm      LV E/e' medial:  18.8 LV IVS:        1.00 cm      LV e' lateral:   7.47 cm/s LVOT diam:     2.20 cm      LV E/e' lateral: 12.0 LV SV:         95 LV SV Index:   38 LVOT Area:     3.80 cm  LV Volumes (MOD) LV vol d, MOD A2C: 118.3 ml LV vol d, MOD A4C: 122.7 ml LV vol s, MOD A2C: 40.5 ml LV vol s, MOD A4C: 64.6 ml LV SV MOD A2C:     77.8 ml LV SV MOD A4C:     122.7 ml LV SV MOD BP:      70.5 ml RIGHT VENTRICLE             IVC RV Basal diam:  4.60 cm     IVC diam: 2.80 cm RV S prime:     10.10 cm/s TAPSE (M-mode): 1.9 cm LEFT ATRIUM              Index        RIGHT ATRIUM           Index LA diam:        5.20 cm  2.08 cm/m   RA Area:     28.00 cm LA Vol (A2C):   109.0 ml 43.67 ml/m  RA Volume:   100.00 ml 40.07 ml/m LA Vol (A4C):   66.2 ml  26.52 ml/m LA Biplane Vol: 87.2 ml  34.94 ml/m  AORTIC VALVE AV Area (Vmax):    3.30 cm AV Area (Vmean):   3.04 cm AV Area (VTI):     2.97 cm AV Vmax:           159.13 cm/s  AV Vmean:          110.579 cm/s AV VTI:            0.319 m AV Peak Grad:      10.1 mmHg AV Mean Grad:      5.5 mmHg LVOT Vmax:         138.00 cm/s LVOT Vmean:        88.350 cm/s LVOT VTI:          0.250 m LVOT/AV VTI ratio: 0.78  AORTA Ao Root diam: 3.20 cm Ao Asc diam:  3.40 cm MITRAL VALVE               TRICUSPID VALVE MV Area (PHT): 3.62 cm    TR Peak grad:   23.2 mmHg MV Decel Time: 210 msec    TR Vmax:        241.00 cm/s MV E velocity: 89.50 cm/s MV A  velocity: 76.55 cm/s  SHUNTS MV E/A ratio:  1.17        Systemic VTI:  0.25 m                            Systemic Diam: 2.20 cm Yvonne Kendall MD Electronically signed by Yvonne Kendall MD Signature Date/Time: 12/23/2022/10:30:45 AM    Final    US Venous Img Lower Bilateral (DVT)  Result Date: 12/23/2022 CLINICAL DATA:  Leg swelling EXAM: BILATERAL LOWER EXTREMITY VENOUS DOPPLER ULTRASOUND TECHNIQUE: Gray-scale sonography with compression, as well as color and duplex ultrasound, were performed to evaluate the deep venous system(s) from the level of the common femoral vein through the popliteal and proximal calf veins. COMPARISON:  07/12/2019 FINDINGS: VENOUS Normal compressibility of the common femoral, superficial femoral, and popliteal veins, as well as the visualized calf veins. Visualized portions of profunda femoral vein and great saphenous vein unremarkable. No filling defects to suggest DVT on grayscale or color Doppler imaging. Doppler waveforms show normal direction of venous flow, normal respiratory plasticity and response to augmentation. OTHER None. Limitations: none IMPRESSION: Negative. Electronically Signed   By: Charlett Nose M.D.   On: 12/23/2022 01:35   DG Chest 2 View  Result Date: 12/22/2022 CLINICAL DATA:  Shortness of breath EXAM: CHEST - 2 VIEW COMPARISON:  Chest x-ray January 01, 2021 FINDINGS: Unchanged cardiomegaly and mediastinal contours. No focal pulmonary opacity. No pleural effusion or pneumothorax. The visualized upper abdomen is unremarkable. No acute osseous abnormality. IMPRESSION: 1. No acute pulmonary abnormality. 2. Unchanged cardiomegaly. Electronically Signed   By: Jacob Moores M.D.   On: 12/22/2022 16:50     Medications:    furosemide (LASIX) 200 mg in dextrose 5 % 100 mL (2 mg/mL) infusion 8 mg/hr (12/24/22 0958)   heparin 1,100 Units/hr (12/23/22 1550)    aspirin EC  81 mg Oral Daily   atorvastatin  40 mg Oral QHS   hydrALAZINE  37.5 mg Oral TID    acetaminophen **OR** acetaminophen, hydrALAZINE, ondansetron **OR** ondansetron (ZOFRAN) IV, senna-docusate  Assessment/ Plan:  Mr. EMERIL STILLE is a 67 y.o.  male with a PMHx of chronic diastolic heart failure, morbid obesity, atrial fibrillation, history of IVC placement, history of recurrent DVT, hyperlipidemia, hypertension, who was admitted to [facility] on 12/22/2022 for Elevated d-dimer [R79.89] Acute hypoxemic respiratory failure (HCC) [J96.01] Acute on chronic congestive heart failure, unspecified heart failure type (HCC) [I50.9]   Acute kidney injury/chronic kidney disease stage II baseline creatinine 1.18 with EGFR 69. Suspect  that the patient's acute kidney injury is related to cardiorenal syndrome. Renal ultrasound negative for hydronephrosis.   Creatinine has shown some improvement today.  Urine output of 1.2 L recorded in previous 24 hours.  Will continue to monitor renal function during diuretic therapy.  Lab Results  Component Value Date   CREATININE 1.91 (H) 12/24/2022   CREATININE 2.45 (H) 12/23/2022   CREATININE 2.47 (H) 12/22/2022    Intake/Output Summary (Last 24 hours) at 12/24/2022 1254 Last data filed at 12/24/2022 0348 Gross per 24 hour  Intake 643.01 ml  Output 650 ml  Net -6.99 ml    2.  Acute on chronic diastolic heart failure.  Significantly elevated BNP of 2212.  Patient started on Lasix injections.  Adequate results noted with furosemide drip.  Will continue for now.    LOS: 2 Wendee Beavers 7/10/202412:54 PM

## 2022-12-24 NOTE — Progress Notes (Signed)
Progress Note   Patient: Frederick Perry:[id number] DOB: 05/22/1956 DOA: 12/22/2022     2 DOS: the patient was seen and examined on 12/24/2022    Brief hospital course: From HPI: "Mr. Darel Ricketts is a 67 year old male with heart failure reduced ejection fraction, morbid obesity, atrial fibrillation status post IVC filter placement in 2017 with recurrent DVT this patient is on Eliquis, hyperlipidemia, hypertension, who presents to the emergency department for chief concerns of swelling of bilateral lower extremities. Vitals in the ED showed temperature of 98, respiration rate of 22, heart rate of 100, improved to 83, blood pressure 134/95, SpO2 of 88% on room air.  BNP was elevated at 2212.1.  High sensitive troponin was elevated 201--270--273.  D-dimer was elevated at 6.14.  VQ scan requested pending"   Further hospital course and management as outlined below.    Assessment and Plan: Acute hypoxemic respiratory failure (HCC) Etiology workup in progress, differentials include heart failure exacerbation however given chest x-ray were unremarkable, and markedly elevated D-dimer, PE cannot be excluded at this time Unable to obtain CTA of the chest as renal function does not allow VQ scan performed back pending Lower extremity Doppler negative for DVTs --Supplement O2 to maintain spO2 > 90%, wean as tolerated  Acute on Chronic HFrEF (heart failure with reduced ejection fraction) (HCC) Appears to be in acute decompensation on admission Echocardiogram 12/22/22 - EF 50-55%, grade II DD, + LV WMA's, mild MR, late systolic MV prolapse BNP significantly elevated 2212 --Cardiology and Nephrology consulted --Continue Lasix drip, increased 5 >> 8 mg/hr this AM --Monitor daily weights, strict I/O's --Monitor renal function, electrolytes   Elevated troponin - suspect demand ischemia in setting of decompensated CHF No chest pain. Cardiology following Telemetry monitoring VQ scan  inconclusive for PE --Will consider CTA chest if/when renal function improves --On heparin drip - continue  Acute kidney injury - improving Likely secondary to cardiorenal syndrome in setting of CHF decompensation Patient presented with creatinine 2.47 however last creatinine was 1.11-year ago Cr 2.45 >> 1.91 today --Nephrology  consulted --Renally dose meds, avoid nephrotoxins --Monitor BMP  History of pulmonary embolism (HCC) Patient endorses that his IVC filter from 2017 has never been removed Patient endorses compliance with home Eliquis, VQ scan has been ordered given markedly elevated D-dimer  CTA chest contraindicated due to AKI VQ scan was inconclusive Doppler of lower extremity did not show any evidence of DVT --Continue heparin per pharmacy --Eliquis on hold for now  Coronary artery disease Continue atorvastatin, aspirin resumed on admission   Essential hypertension Hydralazine 37.5 mg PO TID Hydralazine 5 mg IV PRN   Hyperlipidemia Continue atorvastatin   Morbid obesity (HCC) Body mass index is 36.46 kg/m. Complicates overall care and prognosis.   Recommend lifestyle modifications including physical activity and diet for weight loss and overall long-term health.     Subjective . Interval History Pt drowsy but awake when seen this AM.  Minimally conversant.  Denies acute complaints including chest pain, shortness of breath, dizziness or lightheadedness.  RN reported pt seems confused today, at times not oriented to place or town.       DVT prophylaxis: heparin per pharmacy   Code Status: full code  Diet: heart healthy    Physical Exam:  General exam: awake, drowsy, no acute distress HEENT: atraumatic, clear conjunctiva, anicteric sclera, moist mucus membranes, hearing grossly normal  Respiratory system: CTAB diminished bases, no wheezes or rhonchi, normal respiratory effort. Cardiovascular system: normal S1/S2, RRR,  no pedal edema.    Gastrointestinal system: soft, NT, ND, no HSM felt, +bowel sounds. Central nervous system: no gross focal neurologic deficits, normal speech Extremities: no edema, normal tone Skin: dry, intact, normal temperature, normal color, No rashes, lesions or ulcers Psychiatry: normal mood, congruent affect,     Data Reviewed: Notable labs & studies ---  glucose 112, BUN 41, Cr 1.91 improved, Ca 8.7.  elevated aPTT & heparin level  VQ scan -- inconclusive for PE   Family Communication: none at this time  Disposition Plan: pending clinical course   Time spent: 46 minutes    Author: Pennie Banter, DO 12/24/2022 1:48 PM  For on call review [email or web address].

## 2022-12-24 NOTE — Consult Note (Signed)
ANTICOAGULATION CONSULT NOTE Pharmacy Consult for heparin infusion Indication: chest pain/ACS, apixaban PTA for DVT/PE  No Known Allergies  Patient Measurements: Height: 6' 1.5" (186.7 cm) Weight: 127.1 kg (280 lb 3.3 oz) IBW/kg (Calculated) : 81.06 Heparin Dosing Weight: 109.1 kg  Vital Signs: Temp: 99 F (37.2 C) (07/09 2322) BP: 136/85 (07/09 2322) Pulse Rate: 74 (07/09 2322)  Labs: Recent Labs    12/22/22 1620 12/22/22 1811 12/22/22 1934 12/22/22 1934 12/23/22 0129 12/23/22 0243 12/23/22 1147 12/23/22 1823 12/24/22 0115  HGB 14.3  --   --   --   --  14.3  --   --   --   HCT 45.8  --   --   --   --  47.8  --   --   --   PLT 274  --   --   --   --  239  --   --   --   APTT  --   --  35   < >  --  132* 114* 84* 73*  HEPARINUNFRC  --   --  >1.10*  --   --   --   --   --  >1.10*  CREATININE 2.47*  --   --   --  2.45*  --   --   --   --   TROPONINIHS 201* 270*  --   --  273*  --   --   --   --    < > = values in this interval not displayed.     Estimated Creatinine Clearance: 42.3 mL/min (A) (by C-G formula based on SCr of 2.45 mg/dL (H)).   Medical History: Past Medical History:  Diagnosis Date   Bladder cancer (HCC)    a. 12/2015 s/p resection.   CKD (chronic kidney disease), stage II    Coronary artery disease    a. 2017 Cath: occ OM2, occ PDA (both being filled via collats), mod dzs in LAD->med rx; b. 12/2020 Cath: LM nl, LAD 70p, 42m - dLAD fills via R->L collats. RI 100 CTO - fills via collats from OM1. LCX 60m/d. OM2 100 CTO. RCA 84m, 85d. RPDA 100 - fills via collats from OM3. EF 30-35%->med rx.   HFrEF (heart failure with reduced ejection fraction) (HCC)    a. 08/2015 Echo: EF 50-55%; b. 08/2019 Echo: EF 30-35%; c. 12/2020 Echo: EF 30-35%, GrII DD. Sev apical septal, ant, inf HK. Nl RV fxn. Mild MR. Triv AI. Mild-mod Ao sclerosis.   History of DVT (deep vein thrombosis)    a. 08/2015 - s/p IVC filter; b. 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    Hyperlipidemia LDL goal <70    Hypertension    Not on medications.stopped since he ran out of medications   IDA (iron deficiency anemia)    Ischemic cardiomyopathy    a. 08/2015 Echo: EF 50-55%; b. 08/2019 Echo: EF 30-35%; c. 12/2020 Echo: EF 30-35%.   Myocardial infarction [facility])    Perforated bowel (HCC)    a. 11/2015 s/p surgical repair.   Polysubstance abuse (HCC)    a. ongoing tobacco and alcohol abuse    Pulmonary embolism (HCC)    Type II diabetes mellitus (HCC)     Medications:  PTA: apixaban 5 mg BID - last dose: 7/8 AM per patient  Assessment: 67 y.o. male  with history of ischemic cardiomyopathy, CHF, HTN, DVT/PE s/p IVC filter on Eliquis presenting to the emergency department from clinic for evaluation of hypoxia  and BLLE edema. Initial troponin I 201. Pharmacy has been consulted to initiate and manage IV heparin therapy.    Baseline: Hgb 14.3, Plt 274; HL > 1.10, aPTT 35  Goal of Therapy:  Heparin level 0.3-0.7 units/ml aPTT 66-102 seconds Monitor platelets by anticoagulation protocol: Yes   07/08 0243 aPTT 132, supratherapeutic 07/08 1147 aPTT 114, supratherapeutic  07/08 1823 aPTT 84, therapeutic 07/09 0115 aPTT 73, therapeutic x 2 / HL > 1.10  Plan:  Continue heparin infusion at 1100 units/hr Recheck aPTT & HL daily w/ AM labs while therapeutic  Transition to HL once therapeutic and correlating with aPTT Continue to monitor H&H and platelets  Otelia Sergeant, PharmD, [facility] 12/24/2022 2:19 AM

## 2022-12-24 NOTE — Discharge Instructions (Signed)

## 2022-12-25 DIAGNOSIS — I5033 Acute on chronic diastolic (congestive) heart failure: Secondary | ICD-10-CM | POA: Diagnosis not present

## 2022-12-25 DIAGNOSIS — J9601 Acute respiratory failure with hypoxia: Secondary | ICD-10-CM | POA: Diagnosis not present

## 2022-12-25 DIAGNOSIS — I509 Heart failure, unspecified: Secondary | ICD-10-CM | POA: Diagnosis not present

## 2022-12-25 LAB — AMMONIA: Ammonia: 37 umol/L — ABNORMAL HIGH (ref 9–35)

## 2022-12-25 LAB — BLOOD GAS, VENOUS
Acid-Base Excess: 13.1 mmol/L — ABNORMAL HIGH (ref 0.0–2.0)
Bicarbonate: 43.3 mmol/L — ABNORMAL HIGH (ref 20.0–28.0)
O2 Saturation: 83.8 %
Patient temperature: 37
pCO2, Ven: 86 mmHg (ref 44–60)
pH, Ven: 7.31 (ref 7.25–7.43)
pO2, Ven: 51 mmHg — ABNORMAL HIGH (ref 32–45)

## 2022-12-25 LAB — RENAL FUNCTION PANEL
Albumin: 3.4 g/dL — ABNORMAL LOW (ref 3.5–5.0)
Anion gap: 9 (ref 5–15)
BUN: 41 mg/dL — ABNORMAL HIGH (ref 8–23)
CO2: 34 mmol/L — ABNORMAL HIGH (ref 22–32)
Calcium: 8.8 mg/dL — ABNORMAL LOW (ref 8.9–10.3)
Chloride: 96 mmol/L — ABNORMAL LOW (ref 98–111)
Creatinine, Ser: 1.46 mg/dL — ABNORMAL HIGH (ref 0.61–1.24)
GFR, Estimated: 53 mL/min — ABNORMAL LOW (ref >60)
Glucose, Bld: 128 mg/dL — ABNORMAL HIGH (ref 70–99)
Phosphorus: 3.8 mg/dL (ref 2.5–4.6)
Potassium: 4.5 mmol/L (ref 3.5–5.1)
Sodium: 139 mmol/L (ref 135–145)

## 2022-12-25 LAB — CBC
HCT: 45.7 % (ref 39.0–52.0)
Hemoglobin: 13.5 g/dL (ref 13.0–17.0)
MCH: 30.7 pg (ref 26.0–34.0)
MCHC: 29.5 g/dL — ABNORMAL LOW (ref 30.0–36.0)
MCV: 103.9 fL — ABNORMAL HIGH (ref 80.0–100.0)
Platelets: 194 10*3/uL (ref 150–400)
RBC: 4.4 MIL/uL (ref 4.22–5.81)
RDW: 16.9 % — ABNORMAL HIGH (ref 11.5–15.5)
WBC: 9.1 10*3/uL (ref 4.0–10.5)
nRBC: 0.3 % — ABNORMAL HIGH (ref 0.0–0.2)

## 2022-12-25 LAB — HEPARIN LEVEL (UNFRACTIONATED): Heparin Unfractionated: >1.1 IU/mL — ABNORMAL HIGH (ref 0.30–0.70)

## 2022-12-25 LAB — APTT
aPTT: 54 seconds — ABNORMAL HIGH (ref 24–36)
aPTT: 66 seconds — ABNORMAL HIGH (ref 24–36)

## 2022-12-25 LAB — TSH: TSH: 1.358 u[IU]/mL (ref 0.350–4.500)

## 2022-12-25 MED ORDER — DM-GUAIFENESIN ER 30-600 MG PO TB12
1.0000 | ORAL_TABLET | Freq: Two times a day (BID) | ORAL | Status: DC
  Administered 2022-12-25 – 2023-01-02 (×16): 1 via ORAL
  Filled 2022-12-25 (×17): qty 1

## 2022-12-25 MED ORDER — METOLAZONE 2.5 MG PO TABS
2.5000 mg | ORAL_TABLET | Freq: Once | ORAL | Status: AC
  Administered 2022-12-25: 2.5 mg via ORAL
  Filled 2022-12-25: qty 1

## 2022-12-25 MED ORDER — HEPARIN BOLUS VIA INFUSION
1600.0000 [IU] | Freq: Once | INTRAVENOUS | Status: AC
  Administered 2022-12-25: 1600 [IU] via INTRAVENOUS
  Filled 2022-12-25: qty 1600

## 2022-12-25 NOTE — Evaluation (Signed)
Occupational Therapy Evaluation Patient Details Name: Frederick Perry MRN: [id number] DOB: 1955/11/03 Today's Date: 12/25/2022   History of Present Illness 67 year old male with heart failure reduced ejection fraction, morbid obesity, atrial fibrillation status post IVC filter placement in 2017 with recurrent DVT this patient is on Eliquis, hyperlipidemia, hypertension, who presents to the emergency department for chief concerns of swelling of bilateral lower extremities.   Clinical Impression   Patient presenting with decreased Ind in self care,balance, functional mobility/transfers, endurance, and safety awareness. 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 He endorses living in bed of truck even when not on a job.  Patient currently functioning at mod A of 2 for bed mobility and standing. Pt was able to take several side steps along EOB with +2 assistance for safety. Pt impulsive during session and needing cuing for safety awareness. Pt follows 1 step commands with increased time to process. Pt on 4Ls O2 via [place] and decreased to 88% after standing but quickly recovers. Pt washes face while seated EOB with set up A. Patient will benefit from acute OT to increase overall independence in the areas of ADLs, functional mobility, and safety awareness in order to safely discharge.     Recommendations for follow up therapy are one component of a multi-disciplinary discharge planning process, led by the attending physician.  Recommendations may be updated based on patient status, additional functional criteria and insurance authorization.   Assistance Recommended at Discharge Intermittent Supervision/Assistance  Patient can return home with the following Two people to help with walking and/or transfers;A lot of help with bathing/dressing/bathroom;Assistance with cooking/housework;Assist for transportation;Direct supervision/assist for financial management;Direct supervision/assist for  medications management;Help with stairs or ramp for entrance    Functional Status Assessment  Patient has had a recent decline in their functional status and demonstrates the ability to make significant improvements in function in a reasonable and predictable amount of time.  Equipment Recommendations  Other (comment) (defer to next venue of care)       Precautions / Restrictions Precautions Precautions: Fall      Mobility Bed Mobility Overal bed mobility: Needs Assistance Bed Mobility: Supine to Sit, Sit to Supine     Supine to sit: Mod assist, +2 for physical assistance Sit to supine: Mod assist, +2 for physical assistance        Transfers Overall transfer level: Needs assistance Equipment used: Rolling walker (2 wheels) Transfers: Sit to/from Stand Sit to Stand: +2 physical assistance, Mod assist                  Balance Overall balance assessment: Needs assistance Sitting-balance support: Feet supported, Single extremity supported Sitting balance-Leahy Scale: Good     Standing balance support: Reliant on assistive device for balance, During functional activity, Bilateral upper extremity supported Standing balance-Leahy Scale: Poor                             ADL either performed or assessed with clinical judgement   ADL Overall ADL's : Needs assistance/impaired     Grooming: Wash/dry hands;Wash/dry face;Sitting;Supervision/safety;Set up                   Toilet Transfer: Moderate assistance;Rolling walker (2 wheels);+2 for physical assistance Toilet Transfer Details (indicate cue type and reason): simulated                 Vision Patient Visual Report: No change from baseline  Pertinent Vitals/Pain Pain Assessment Pain Assessment: Faces Faces Pain Scale: Hurts a little bit Pain Location: generalized Pain Descriptors / Indicators: Discomfort Pain Intervention(s): Monitored during session, Repositioned      Hand Dominance     Extremity/Trunk Assessment Upper Extremity Assessment Upper Extremity Assessment: Generalized weakness   Lower Extremity Assessment Lower Extremity Assessment: Generalized weakness       Communication Communication Communication: No difficulties   Cognition Arousal/Alertness: Awake/alert, Lethargic Behavior During Therapy: Flat affect, Impulsive Overall Cognitive Status: Impaired/Different from baseline                                 General Comments: Pt needing increased time to process and decreased safety awareness. Follows 1 step commands.                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      Prior Functioning/Environment Prior Level of Function : Independent/Modified Independent;Working/employed;Driving                        OT Problem List: Decreased strength;Decreased activity tolerance;Decreased safety awareness;Impaired balance (sitting and/or standing);Decreased knowledge of use of DME or AE;Decreased cognition      OT Treatment/Interventions: Self-care/ADL training;Therapeutic activities;Therapeutic exercise;Energy conservation;DME and/or AE instruction;Patient/family education;Balance training;Cognitive remediation/compensation    OT Goals(Current goals can be found in the care plan section) Acute Rehab OT Goals Patient Stated Goal: to get stronger and return to PLOF OT Goal Formulation: With patient Time For Goal Achievement: 01/08/23 Potential to Achieve Goals: Fair ADL Goals Pt Will Perform Grooming: standing;with min guard assist Pt Will Perform Lower Body Dressing: with min guard assist;sit to/from stand Pt Will Transfer to Toilet: with min  guard assist;ambulating Pt Will Perform Toileting - Clothing Manipulation and hygiene: with min guard assist;sit to/from stand  OT Frequency: Min 1X/week    Co-evaluation PT/OT/SLP Co-Evaluation/Treatment: Yes Reason for Co-Treatment: To address functional/ADL transfers;Complexity of the patient's impairments (multi-system involvement);For patient/therapist safety PT goals addressed during session: Mobility/safety with mobility OT goals addressed during session: ADL's and self-care      AM-PAC OT "6 Clicks" Daily Activity     Outcome Measure Help from another person eating meals?: None Help from another person taking care of personal grooming?: None Help from another person toileting, which includes using toliet, bedpan, or urinal?: A Lot Help from another person bathing (including washing, rinsing, drying)?: A Lot Help from another person to put on and taking off regular upper body clothing?: A Lot Help from another person to put on and taking off regular lower body clothing?: A Lot 6 Click Score: 16   End of Session Equipment Utilized During Treatment: Rolling walker (2 wheels) Nurse Communication: Mobility status  Activity Tolerance: Patient tolerated treatment well Patient left: in bed;with call bell/phone within reach;with bed alarm set  OT Visit Diagnosis: Unsteadiness on feet (R26.81);Muscle weakness (generalized) (M62.81)                Time: 8119-1478 OT Time Calculation (min): 19 min Charges:  OT  General Charges $OT Visit: 1 Visit OT Evaluation $OT Eval Moderate Complexity: 1 [address] [place], OTR/L , CBIS ascom [phone]  12/25/22, 11:22 AM

## 2022-12-25 NOTE — TOC Initial Note (Signed)
Transition of Care [facility]) - Initial/Assessment Note    Patient Details  Name: Frederick Perry MRN: [id number] Date of Birth: 08-29-1955  Transition of Care [facility]) CM/SW Contact:    Margarito Liner, LCSW Phone Number: 12/25/2022, 1:09 PM  Clinical Narrative:    CSW met with patient. No supports at bedside. CSW introduced role and explained that therapy recommendations would be discussed. Patient is unsure if he wants SNF. He would like to discuss with his son. CSW will follow up tomorrow. Gave CMS scores for facilities within 25 miles of [place]. No further concerns. CSW encouraged patient to contact CSW as needed. CSW will continue to follow patient for support and facilitate discharge once medically stable.              Expected Discharge Plan:  (TBD) Barriers to Discharge: Continued Medical Work up   Patient Goals and CMS Choice   CMS Medicare.gov Compare Post Acute Care list provided to:: Patient        Expected Discharge Plan and Services     Post Acute Care Choice:  (TBD) Living arrangements for the past 2 months:  (Truck)                                      Prior Living Arrangements/Services Living arrangements for the past 2 months:  (Truck) Lives with:: Self Patient language and need for interpreter reviewed:: Yes Do you feel safe going back to the place where you live?: Yes      Need for Family Participation in Patient Care: Yes (Comment)     Criminal Activity/Legal Involvement Pertinent to Current Situation/Hospitalization: No - Comment as needed  Activities of Daily Living Home Assistive Devices/Equipment: Eyeglasses ADL Screening (condition at time of admission) Patient's cognitive ability adequate to safely complete daily activities?: Yes Is the patient deaf or have difficulty hearing?: No Does the patient have difficulty seeing, even when wearing glasses/contacts?: No Does the patient have difficulty concentrating, remembering, or making  decisions?: No Patient able to express need for assistance with ADLs?: Yes Does the patient have difficulty dressing or bathing?: No Independently performs ADLs?: Yes (appropriate for developmental age) Does the patient have difficulty walking or climbing stairs?: No Weakness of Legs: None Weakness of Arms/Hands: None  Permission Sought/Granted                  Emotional Assessment Appearance:: Appears stated age Attitude/Demeanor/Rapport: Engaged Affect (typically observed): Calm, Flat Orientation: : Oriented to Self, Oriented to Place, Oriented to  Time, Oriented to Situation Alcohol / Substance Use: Not Applicable Psych Involvement: No (comment)  Admission diagnosis:  Elevated d-dimer [R79.89] Acute hypoxemic respiratory failure (HCC) [J96.01] Acute on chronic congestive heart failure, unspecified heart failure type [facility]) [I50.9] Patient Active Problem List   Diagnosis Date Noted   Acute on chronic congestive heart failure (HCC) 12/24/2022   Elevated d-dimer 12/24/2022   Acute hypoxemic respiratory failure (HCC) 12/22/2022   Morbid obesity (HCC) 12/22/2022   Elevated troponin 12/22/2022   Acute systolic CHF (congestive heart failure) (HCC)    AKI (acute kidney injury) (HCC)    Chronic HFrEF (heart failure with reduced ejection fraction) (HCC) 01/07/2021   HFrEF (heart failure with reduced ejection fraction) (HCC) 01/07/2021   Essential hypertension    Coronary artery disease    Pulmonary embolism (HCC)    Nicotine dependence    Palpitation 01/01/2021   Diabetes mellitus  without complication (HCC) 02/04/2018   Bladder cancer (HCC) 03/26/2016   Perforated bowel (HCC) 12/14/2015   Perforation bowel (HCC)    Iron deficiency anemia due to chronic blood loss 09/07/2015   Weight loss    NSTEMI (non-ST elevated myocardial infarction) (HCC) 09/05/2015   Absolute anemia    Ischemic chest pain (HCC)    Uncontrolled type 2 diabetes mellitus with circulatory disorder     Hyperlipidemia    PCP:  Enid Baas, MD Pharmacy:   [phone] [place], [place] - [address] [place] [address] Phone: [phone] Fax: [phone]     Social Determinants of Health (SDOH) Social History: SDOH Screenings   Food Insecurity: No Food Insecurity (12/23/2022)  Housing: Low Risk  (12/23/2022)  Transportation Needs: No Transportation Needs (12/23/2022)  Utilities: Not At Risk (12/23/2022)  Financial Resource Strain: Low Risk  (12/22/2022)   Received from [facility]  Physical Activity: Unknown (02/04/2018)  Social Connections: Unknown (02/04/2018)  Tobacco Use: High Risk (12/23/2022)   SDOH Interventions:     Readmission Risk Interventions     No data to display

## 2022-12-25 NOTE — Consult Note (Addendum)
ANTICOAGULATION CONSULT NOTE  Pharmacy Consult for Heparin Infusion Indication: chest pain/ACS, apixaban PTA for DVT/PE  Patient Measurements: Height: 6' 1.5" (186.7 cm) Weight: 127.1 kg (280 lb 3.3 oz) IBW/kg (Calculated) : 81.06 Heparin Dosing Weight: 109.1 kg  Labs: Recent Labs    12/22/22 1934 12/22/22 1934 12/23/22 0129 12/23/22 0243 12/23/22 1147 12/24/22 0115 12/24/22 0443 12/25/22 0602 12/25/22 1810  HGB  --    < >  --  14.3  --   --  14.1 13.5  --   HCT  --   --   --  47.8  --   --  46.3 45.7  --   PLT  --   --   --  239  --   --  210 194  --   APTT 35  --   --  132*   < > 73*  --  54* 66*  HEPARINUNFRC >1.10*  --   --   --   --  >1.10*  --  >1.10*  --   CREATININE  --   --  2.45*  --   --   --  1.91* 1.46*  --   TROPONINIHS  --   --  273*  --   --   --   --   --   --    < > = values in this interval not displayed.    Estimated Creatinine Clearance: 71 mL/min (A) (by C-G formula based on SCr of 1.46 mg/dL (H)).   Medical History: Past Medical History:  Diagnosis Date   Bladder cancer (HCC)    a. 12/2015 s/p resection.   CKD (chronic kidney disease), stage II    Coronary artery disease    a. 2017 Cath: occ OM2, occ PDA (both being filled via collats), mod dzs in LAD->med rx; b. 12/2020 Cath: LM nl, LAD 70p, 71m - dLAD fills via R->L collats. RI 100 CTO - fills via collats from OM1. LCX 45m/d. OM2 100 CTO. RCA 14m, 85d. RPDA 100 - fills via collats from OM3. EF 30-35%->med rx.   HFrEF (heart failure with reduced ejection fraction) (HCC)    a. 08/2015 Echo: EF 50-55%; b. 08/2019 Echo: EF 30-35%; c. 12/2020 Echo: EF 30-35%, GrII DD. Sev apical septal, ant, inf HK. Nl RV fxn. Mild MR. Triv AI. Mild-mod Ao sclerosis.   History of DVT (deep vein thrombosis)    a. 08/2015 - s/p IVC filter; b. 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   Hyperlipidemia LDL goal <70    Hypertension    Not on medications.stopped since he ran out of medications   IDA (iron deficiency  anemia)    Ischemic cardiomyopathy    a. 08/2015 Echo: EF 50-55%; b. 08/2019 Echo: EF 30-35%; c. 12/2020 Echo: EF 30-35%.   Myocardial infarction [facility])    Perforated bowel (HCC)    a. 11/2015 s/p surgical repair.   Polysubstance abuse (HCC)    a. ongoing tobacco and alcohol abuse    Pulmonary embolism (HCC)    Type II diabetes mellitus (HCC)     Medications:  PTA: apixaban 5 mg BID - last dose: 7/8 AM per patient  Assessment: 67 y.o. male  with history of ischemic cardiomyopathy, CHF, HTN, DVT/PE s/p IVC filter on Eliquis presenting to the emergency department from clinic for evaluation of hypoxia and BLLE edema. Initial troponin I 201. Pharmacy has been consulted to initiate and manage IV heparin therapy.    07/08 0243 aPTT 132, supratherapeutic 07/08 1147  aPTT 114, supratherapeutic  07/08 1823 aPTT 84, therapeutic 07/09 0115 aPTT 73, therapeutic x 2 / HL > 1.10 07/11 0602 aPTT 54, HL > 1.1 07/11 1810 aPTT 66, therapeutic x 1  Goal of Therapy:  Heparin level 0.3-0.7 units/ml aPTT 66-102 seconds Monitor platelets by anticoagulation protocol: Yes   Plan:  --aPTT is therapeutic x 1 at lower limit of goal range --Will maintain heparin infusion at current rate of 1300 units/hr --Re-check confirmatory aPTT in 6 hours and re-check heparin level tomorrow AM Follow aPTT until correlation established with heparin level and then switch over to heparin level monitoring only --Daily CBC per protocol while on IV heparin  Tressie Ellis 12/25/2022 6:46 PM

## 2022-12-25 NOTE — Care Management Important Message (Signed)
Important Message  Patient Details  Name: Frederick Perry MRN: [id number] Date of Birth: 08-Jan-1956   Medicare Important Message Given:  N/A - LOS <3 / Initial given by admissions     Johnell Comings 12/25/2022, 12:43 PM

## 2022-12-25 NOTE — Consult Note (Signed)
ANTICOAGULATION CONSULT NOTE Pharmacy Consult for heparin infusion Indication: chest pain/ACS, apixaban PTA for DVT/PE  No Known Allergies  Patient Measurements: Height: 6' 1.5" (186.7 cm) Weight: 127.1 kg (280 lb 3.3 oz) IBW/kg (Calculated) : 81.06 Heparin Dosing Weight: 109.1 kg  Vital Signs: Temp: 98.4 F (36.9 C) (07/11 0805) BP: 145/99 (07/11 0805) Pulse Rate: 88 (07/11 0805)  Labs: Recent Labs    12/22/22 1620 12/22/22 1620 12/22/22 1811 12/22/22 1934 12/23/22 0129 12/23/22 0243 12/23/22 1147 12/23/22 1823 12/24/22 0115 12/24/22 0443 12/25/22 0602  HGB 14.3  --   --   --   --  14.3  --   --   --  14.1 13.5  HCT 45.8  --   --   --   --  47.8  --   --   --  46.3 45.7  PLT 274  --   --   --   --  239  --   --   --  210 194  APTT  --    < >  --  35  --  132*   < > 84* 73*  --  54*  HEPARINUNFRC  --   --   --  >1.10*  --   --   --   --  >1.10*  --  >1.10*  CREATININE 2.47*  --   --   --  2.45*  --   --   --   --  1.91* 1.46*  TROPONINIHS 201*  --  270*  --  273*  --   --   --   --   --   --    < > = values in this interval not displayed.    Estimated Creatinine Clearance: 71 mL/min (A) (by C-G formula based on SCr of 1.46 mg/dL (H)).   Medical History: Past Medical History:  Diagnosis Date   Bladder cancer (HCC)    a. 12/2015 s/p resection.   CKD (chronic kidney disease), stage II    Coronary artery disease    a. 2017 Cath: occ OM2, occ PDA (both being filled via collats), mod dzs in LAD->med rx; b. 12/2020 Cath: LM nl, LAD 70p, 31m - dLAD fills via R->L collats. RI 100 CTO - fills via collats from OM1. LCX 16m/d. OM2 100 CTO. RCA 60m, 85d. RPDA 100 - fills via collats from OM3. EF 30-35%->med rx.   HFrEF (heart failure with reduced ejection fraction) (HCC)    a. 08/2015 Echo: EF 50-55%; b. 08/2019 Echo: EF 30-35%; c. 12/2020 Echo: EF 30-35%, GrII DD. Sev apical septal, ant, inf HK. Nl RV fxn. Mild MR. Triv AI. Mild-mod Ao sclerosis.   History of DVT (deep vein  thrombosis)    a. 08/2015 - s/p IVC filter; b. 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   Hyperlipidemia LDL goal <70    Hypertension    Not on medications.stopped since he ran out of medications   IDA (iron deficiency anemia)    Ischemic cardiomyopathy    a. 08/2015 Echo: EF 50-55%; b. 08/2019 Echo: EF 30-35%; c. 12/2020 Echo: EF 30-35%.   Myocardial infarction [facility])    Perforated bowel (HCC)    a. 11/2015 s/p surgical repair.   Polysubstance abuse (HCC)    a. ongoing tobacco and alcohol abuse    Pulmonary embolism (HCC)    Type II diabetes mellitus (HCC)     Medications:  PTA: apixaban 5 mg BID - last dose: 7/8 AM per patient  Assessment:  67 y.o. male  with history of ischemic cardiomyopathy, CHF, HTN, DVT/PE s/p IVC filter on Eliquis presenting to the emergency department from clinic for evaluation of hypoxia and BLLE edema. Initial troponin I 201. Pharmacy has been consulted to initiate and manage IV heparin therapy.    07/08 0243 aPTT 132, supratherapeutic 07/08 1147 aPTT 114, supratherapeutic  07/08 1823 aPTT 84, therapeutic 07/09 0115 aPTT 73, therapeutic x 2 / HL > 1.10 07/11 0602 aPTT 54, HL > 1.1    Goal of Therapy:  Heparin level 0.3-0.7 units/ml aPTT 66-102 seconds Monitor platelets by anticoagulation protocol: Yes    Plan:  aPTT is subtherapeutic. Will give heparin bolus of 1600 units x 1 and increase heparin infusion to 1300 units/hr. Recheck heparin level in 6 hours. CBC daily while on heparin.   Paschal Dopp, PharmD 12/25/2022 8:22 AM

## 2022-12-25 NOTE — Progress Notes (Addendum)
Rounding Note    Patient Name: Frederick Perry Date of Encounter: 12/25/2022  [facility] Cardiologist: Debbe Odea, MD   Subjective   Lethargic this morning, reports he did not sleep as well Minimally conversant but arousable Breakfast tray in front of him, " I can eat" On Lasix infusion at 8 mg/h -1.5 L past 24 hours Still with massive leg edema, abdominal distention  Inpatient Medications    Scheduled Meds:  aspirin EC  81 mg Oral Daily   atorvastatin  40 mg Oral QHS   heparin  1,600 Units Intravenous Once   hydrALAZINE  37.5 mg Oral TID   isosorbide mononitrate  30 mg Oral Daily   metolazone  2.5 mg Oral Once   Continuous Infusions:  furosemide (LASIX) 200 mg in dextrose 5 % 100 mL (2 mg/mL) infusion 8 mg/hr (12/24/22 0958)   heparin 1,100 Units/hr (12/24/22 1449)   PRN Meds: acetaminophen **OR** acetaminophen, hydrALAZINE, ondansetron **OR** ondansetron (ZOFRAN) IV, senna-docusate   Vital Signs    Vitals:   12/24/22 1718 12/24/22 2150 12/25/22 0333 12/25/22 0805  BP: (!) 157/74 120/85 (!) 145/75 (!) 145/99  Pulse: 80 82 76 88  Resp: 18 18 20  (!) 24  Temp: 98.5 F (36.9 C) 98.2 F (36.8 C) 98.4 F (36.9 C) 98.4 F (36.9 C)  TempSrc:      SpO2: 95% 92% 98% 92%  Weight:      Height:        Intake/Output Summary (Last 24 hours) at 12/25/2022 1100 Last data filed at 12/25/2022 0703 Gross per 24 hour  Intake 960 ml  Output 2300 ml  Net -1340 ml      12/22/2022    7:00 PM 01/22/2022    2:53 PM 02/06/2021   10:41 AM  Last 3 Weights  Weight (lbs) 280 lb 3.3 oz 250 lb 4 oz 245 lb  Weight (kg) 127.1 kg 113.513 kg 111.131 kg      Telemetry    Normal sinus rhythm- Personally Reviewed  ECG     - Personally Reviewed  Physical Exam   GEN: Lethargic Neck: Unable to estimate JVD Cardiac: RRR, no murmurs, rubs, or gallops.,  2+ pitting lower extremity edema Respiratory: Clear to auscultation bilaterally. GI: Soft, nontender, mildly  distended  MS: No edema; No deformity. Neuro:  Nonfocal  Psych: Normal affect   Labs    High Sensitivity Troponin:   Recent Labs  Lab 12/22/22 1620 12/22/22 1811 12/23/22 0129  TROPONINIHS 201* 270* 273*     Chemistry Recent Labs  Lab 12/23/22 0129 12/24/22 0443 12/25/22 0602  NA 135 137 139  K 5.1 5.0 4.5  CL 98 98 96*  CO2 30 28 34*  GLUCOSE 138* 112* 128*  BUN 39* 41* 41*  CREATININE 2.45* 1.91* 1.46*  CALCIUM 8.5* 8.7* 8.8*  ALBUMIN  --   --  3.4*  GFRNONAA 28* 38* 53*  ANIONGAP 7 11 9     Lipids No results for input(s): "CHOL", "TRIG", "HDL", "LABVLDL", "LDLCALC", "CHOLHDL" in the last 168 hours.  Hematology Recent Labs  Lab 12/23/22 0243 12/24/22 0443 12/25/22 0602  WBC 7.7 8.1 9.1  RBC 4.62 4.41 4.40  HGB 14.3 14.1 13.5  HCT 47.8 46.3 45.7  MCV 103.5* 105.0* 103.9*  MCH 31.0 32.0 30.7  MCHC 29.9* 30.5 29.5*  RDW 17.2* 17.0* 16.9*  PLT 239 210 194   Thyroid No results for input(s): "TSH", "FREET4" in the last 168 hours.  BNP Recent Labs  Lab 12/22/22 1620  BNP 2,212.1*    DDimer  Recent Labs  Lab 12/22/22 1659  DDIMER 6.14*     Radiology    NM Pulmonary Perfusion  Result Date: 12/23/2022 CLINICAL DATA:  Hypoxia, elevated D-dimer, clinical suspicion for PE EXAM: NUCLEAR MEDICINE PERFUSION LUNG SCAN TECHNIQUE: Perfusion images were obtained in multiple projections after intravenous injection of radiopharmaceutical. Ventilation scans intentionally deferred if perfusion scan and chest x-ray adequate for interpretation during COVID 19 epidemic. RADIOPHARMACEUTICALS:  4.25 mCi Tc-74m MAA IV COMPARISON:  Chest radiographs none on 12/22/2022 FINDINGS: There are no discrete wedge-shaped perfusion defects. There are moderate-sized nonsegmental foci off decreased tracer uptake in the posterior mid and lower lung fields, more so on the left side. Evaluation is limited without ventilation images. There are no focal infiltrates in the lung fields in the areas  of decreased perfusion. IMPRESSION: There are moderate sized areas of decreased tracer uptake in the posterior mid and lower lung fields, more so on the left side. No discrete wedge-shaped perfusion defects are seen.Study is inconclusive to evaluate for PE. If clinically warranted, follow-up CT pulmonary angiogram may be considered. Electronically Signed   By: Ernie Avena M.D.   On: 12/23/2022 17:43   US RENAL  Result Date: 12/23/2022 CLINICAL DATA:  Acute kidney failure EXAM: RENAL / URINARY TRACT ULTRASOUND COMPLETE COMPARISON:  CT angiogram abdomen and pelvis December 14, 2015 FINDINGS: Right Kidney: Renal measurements: 10.7 x 5.6 x 5.8 cm = volume: 182.0 mL. Echogenicity within normal limits. No mass or hydronephrosis visualized. Left Kidney: Renal measurements: 11.3 x 6.3 x 5.3 cm = volume: 198.1 mL. Echogenicity within normal limits. No hydronephrosis. There is an exophytic benign cyst originating from the mid-pole, measuring 3.4 x 2.3 x 3.1 cm. Bladder: Appears normal for degree of bladder distention. Other: None. IMPRESSION: No acute findings. Electronically Signed   By: Jacob Moores M.D.   On: 12/23/2022 12:21    Cardiac Studies  Echo   1. Left ventricular ejection fraction, by estimation, is 50 to 55%. The  left ventricle has low normal function. The left ventricle demonstrates  regional wall motion abnormalities (see scoring diagram/findings for  description). Left ventricular diastolic   parameters are consistent with Grade II diastolic dysfunction  (pseudonormalization). Elevated left atrial pressure.   2. Right ventricular systolic function is mildly reduced. The right  ventricular size is mildly enlarged. There is mildly elevated pulmonary  artery systolic pressure.   3. Left atrial size was mildly dilated.   4. Right atrial size was mildly dilated.   5. The mitral valve is abnormal. Mild mitral valve regurgitation. No  evidence of mitral stenosis. There is mild late  systolic prolapse of  posterior leaflet of the mitral valve. Cannot exclude a ruptured chord.   6. The aortic valve is tricuspid. There is moderate calcification of the  aortic valve. There is moderate thickening of the aortic valve. Aortic  valve regurgitation is not visualized. Aortic valve  sclerosis/calcification is present, without any evidence  of aortic stenosis.   7. The inferior vena cava is dilated in size with <50% respiratory  variability, suggesting right atrial pressure of 15 mmHg.    Patient Profile     Mr. Garin Mata is a 67 year old male with history of coronary artery disease, cardiomyopathy, hypertension, diabetes, smoking, alcohol abuse, chronic kidney disease, DVT/PE prior IVC filter 2017 on Eliquis for recurrent DVT 2021, presenting with worsening leg swelling, hypoxia, weight gain   Assessment & Plan   Acute  systolic and diastolic CHF Causing acute hypoxic respiratory failure, elevated D-dimer, VQ scan indeterminate, no DVT on lower extremity venous Doppler Ejection fraction 50 to 55%, dilated IVC consistent with CHF, hypervolemia Clinical exam consistent with anasarca with massive leg edema, has 30 pound weight gain, BNP 2200 -Remains on Lasix infusion, dose increased from 5 up to 8 mg/h -1.5 L negative, renal function improving Creatinine on arrival 2.47 now down to 1.46 with diuresis consistent with cardiorenal syndrome Yesterday started CHF education, more lethargic today -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 -On hydralazine 3 times daily, Imdur 30 ACE, ARB, ARNI on hold in setting of renal dysfunction -Will give dose of metolazone 2.5 x 1   Acute renal failure Creatinine 1.18 in August 2022 Presenting creatinine 2.47 with slow improvement with diuresis Suspect cardiorenal syndrome Plan to continue Lasix infusion, will give dose of metolazone 2.5 x 1  clinical features consistent with hypervolemia   Coronary artery disease with  elevated troponin Likely supply/demand mismatch in the setting of CHF Prior ischemic workup July 2022 heart catheterization medical management recommended -Previously denied symptoms concerning for angina -EKG concerning for T wave abnormality inferior, lateral leads -Continue aspirin, statin, isosorbide -Consider outpatient ischemic workup   Essential hypertension -On hydralazine, isosorbide mononitrate 30 Unclear if he will take hydralazine 3 times daily Carvedilol on hold in the setting of acute CHF   History of DVT, PE IVC filter 2017 On Eliquis for recurrent DVT Elevated D-dimer 6 Ultrasound legs negative for DVT VQ scan indeterminate On heparin infusion with plan to restart Eliquis at time of discharge  Lethargy Less arousable this morning compared to yesterday, less conversant Appears to be waking up a little bit more with conversation, about to start his breakfast If lethargy persists, may need to check ABG, consider CPAP   Total encounter time more than 35 minutes  Greater than 50% was spent in counseling and coordination of care with the patient   For questions or updates, please contact [facility] Please consult [email or web address] for contact info under        Signed, Julien Nordmann, MD  12/25/2022, 11:00 AM

## 2022-12-25 NOTE — Plan of Care (Signed)

## 2022-12-25 NOTE — Progress Notes (Signed)
Progress Note   Patient: Frederick Perry:[id number] DOB: 1956-01-31 DOA: 12/22/2022     3 DOS: the patient was seen and examined on 12/25/2022    Brief hospital course: From HPI: "[name] is a 67 year old male with heart failure reduced ejection fraction, morbid obesity, atrial fibrillation status post IVC filter placement in 2017 with recurrent DVT this patient is on Eliquis, hyperlipidemia, hypertension, who presents to the emergency department for chief concerns of swelling of bilateral lower extremities. Vitals in the ED showed temperature of 98, respiration rate of 22, heart rate of 100, improved to 83, blood pressure 134/95, SpO2 of 88% on room air.  BNP was elevated at 2212.1.  High sensitive troponin was elevated 201--270--273.  D-dimer was elevated at 6.14.  VQ scan requested pending"   Further hospital course and management as outlined below.    Assessment and Plan: Acute respiratory failure with hypoxia and hypercapnia Etiology workup in progress, differentials include heart failure exacerbation however given chest x-ray were unremarkable, and markedly elevated D-dimer, PE cannot be excluded at this time Unable to obtain CTA of the chest as renal function does not allow VQ scan performed back pending Lower extremity Doppler negative for DVTs 7/11 - VBG obtained due to lethargy - pCO2 88 --Supplement O2 to maintain spO2 > 90%, wean as tolerated --start BiPAP  Acute on Chronic HFrEF (heart failure with reduced ejection fraction) (HCC) Appears to be in acute decompensation on admission Echocardiogram 12/22/22 - EF 50-55%, grade II DD, + LV WMA's, mild MR, late systolic MV prolapse BNP significantly elevated 2212 --Cardiology and Nephrology consulted --Continue Lasix drip, currently at 8 mg/hr  --Monitor daily weights, strict I/O's --Monitor renal function, electrolytes  Acute metabolic encephalopathy - due to hypercapnic respiratory failure --BiPAP for  ventilation --Monitor blood gas --Delirium precautions   Elevated troponin - suspect demand ischemia in setting of decompensated CHF No chest pain. Cardiology following Telemetry monitoring VQ scan inconclusive for PE --Will consider CTA chest if/when renal function improves --On heparin drip - continue  Acute kidney injury - improving Likely secondary to cardiorenal syndrome in setting of CHF decompensation Patient presented with creatinine 2.47 however last creatinine was 1.11-year ago Cr 2.45 >> 1.91 >> 1.46 today --Nephrology  consulted --Renally dose meds, avoid nephrotoxins --Monitor BMP  History of pulmonary embolism (HCC) Patient endorses that his IVC filter from 2017 has never been removed Patient endorses compliance with home Eliquis, VQ scan has been ordered given markedly elevated D-dimer  CTA chest contraindicated due to AKI VQ scan was inconclusive Doppler of lower extremity did not show any evidence of DVT --Continue heparin per pharmacy --Eliquis on hold for now  Coronary artery disease Continue atorvastatin, aspirin resumed on admission   Essential hypertension Hydralazine 37.5 mg PO TID Hydralazine 5 mg IV PRN   Hyperlipidemia Continue atorvastatin   Morbid obesity (HCC) Body mass index is 36.46 kg/m. Complicates overall care and prognosis.   Recommend lifestyle modifications including physical activity and diet for weight loss and overall long-term health.     Subjective . Interval History Pt lethargic when seen this AM.  RN also reported pt was very weak when attempting to stand earlier this AM.  Pt briefly opens eyes but cannot stay awake.  No acute complaints or acute events reported.     DVT prophylaxis: heparin per pharmacy   Code Status: full code  Diet: heart healthy    Physical Exam:  General exam: lethargic, responds briefly but cannot stay awake,  no acute distress HEENT: moist mucus membranes, hearing grossly normal   Respiratory system: CTAB but generally diminished, no wheezes or rhonchi, mildly increased respiratory effort with accessory muscle use. Cardiovascular system: normal S1/S2, RRR,  no pedal edema.   Gastrointestinal system: soft, NT, ND Central nervous system: exam limited by lethargy, grossly non-focal, moves all extremities Extremities: no edema, normal tone Skin: dry, intact, normal temperature Psychiatry: normal mood, congruent affect    Data Reviewed: Notable labs & studies ---  Cl 96, bicarb 34, glucose 128, BUN 41, Cr 1.91 >> 1.46 improving, Ca 8.8, albumin 3.4 Ammonia 37 minimally elevated VBG - pH 7.31 / pCO2 86/ pO2 51  VQ scan 7/10 -- inconclusive for PE   Family Communication: none at this time  Disposition Plan: pending clinical course   Time spent: 48 minutes    Author: Pennie Banter, DO 12/25/2022 2:18 PM  For on call review [email or web address].

## 2022-12-25 NOTE — Evaluation (Signed)
Physical Therapy Evaluation Patient Details Name: Frederick Perry MRN: [id number] DOB: 04-26-1956 Today's Date: 12/25/2022  History of Present Illness  Patient is a 67 year old male with heart failure reduced ejection fraction, morbid obesity, atrial fibrillation status post IVC filter placement in 2017 with recurrent DVT this patient is on Eliquis, hyperlipidemia, hypertension, who presents to the emergency department for chief concerns of swelling of bilateral lower extremities.   Clinical Impression  Patient is groggy but agreeable to PT evaluation. He reports he is independent at baseline and lives alone, full time in his 37 wheeler truck. He is a [employer].  Today, the patient is slow to respond at times with following single step commands. He required +2 person assistance with bed mobility and transfers. He was able to stand but is unsteady and has limited standing tolerance and generalized weakness. Sp02 down to 88% briefly with activity on 4 L02 and increased to the low 90's with rest break and cues for breathing. The patient is not at his baseline level of functional independence. Recommend PT follow up to maximize independence and decrease caregiver burden.       Assistance Recommended at Discharge Frequent or constant Supervision/Assistance  If plan is discharge home, recommend the following:  Can travel by private vehicle  A lot of help with walking and/or transfers;A lot of help with bathing/dressing/bathroom;Assistance with cooking/housework;Help with stairs or ramp for entrance;Assist for transportation   No    Equipment Recommendations Rolling walker (2 wheels)  Recommendations for Other Services       Functional Status Assessment Patient has had a recent decline in their functional status and demonstrates the ability to make significant improvements in function in a reasonable and predictable amount of time.     Precautions / Restrictions Precautions Precautions:  Fall Restrictions Weight Bearing Restrictions: No      Mobility  Bed Mobility Overal bed mobility: Needs Assistance Bed Mobility: Supine to Sit, Sit to Supine     Supine to sit: Mod assist, +2 for physical assistance Sit to supine: Mod assist, +2 for physical assistance   General bed mobility comments: verbal cues for technique    Transfers Overall transfer level: Needs assistance Equipment used: Rolling walker (2 wheels) Transfers: Sit to/from Stand Sit to Stand: Mod assist, +2 physical assistance, From elevated surface           General transfer comment: verbal cues for hand placement for safety    Ambulation/Gait             Pre-gait activities: patient is able to take one step forward and several side steps to the left using rolling walker with +2 person assistance. activity tolerance limited for progression of ambulation away from the bed. patient could benefit from a chair follow for future ambulation attempts    Stairs            Wheelchair Mobility     Tilt Bed    Modified Rankin (Stroke Patients Only)       Balance Overall balance assessment: Needs assistance Sitting-balance support: Feet supported, Single extremity supported Sitting balance-Leahy Scale: Good     Standing balance support: Reliant on assistive device for balance, During functional activity, Bilateral upper extremity supported Standing balance-Leahy Scale: Poor Standing balance comment: external support required with mild unsteadines with dynamic activity                             Pertinent Vitals/Pain Pain  Assessment Pain Assessment: Faces Faces Pain Scale: Hurts a little bit    Home Living Family/patient expects to be discharged to::  (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    Prior Function  Prior Level of Function : Independent/Modified Independent;Working/employed;Driving                     Hand Dominance        Extremity/Trunk Assessment   Upper Extremity Assessment Upper Extremity Assessment: Generalized weakness    Lower Extremity Assessment Lower Extremity Assessment: Generalized weakness (edema noted throughout BLE)       Communication   Communication: No difficulties  Cognition Arousal/Alertness: Awake/alert, Lethargic Behavior During Therapy: Flat affect, Impulsive Overall Cognitive Status: Impaired/Different from baseline                                 General Comments: increased time for command following with improvement during mobility        General Comments General comments (skin integrity, edema, etc.): Sp02 down to 88-89 on 4 L02 initially with mobility with increase to low 90's with cues for breathing techniques    Exercises     Assessment/Plan    PT Assessment Patient needs continued PT services  PT Problem List Decreased strength;Decreased activity tolerance;Decreased balance;Decreased mobility;Decreased safety awareness;Decreased knowledge of use of DME       PT Treatment Interventions DME instruction;Gait training;Stair training;Functional mobility training;Therapeutic activities;Therapeutic exercise;Balance training;Neuromuscular re-education;Cognitive remediation;Patient/family education    PT Goals (Current goals can be found in the Care Plan section)  Acute Rehab PT Goals Patient Stated Goal: to return to prior level of independence PT Goal Formulation: With patient Time For Goal Achievement: 01/08/23 Potential to Achieve Goals: Fair    Frequency Min 1X/week     Co-evaluation PT/OT/SLP Co-Evaluation/Treatment: Yes Reason for Co-Treatment: To address functional/ADL transfers;Complexity of the patient's impairments (multi-system involvement);For patient/therapist safety PT goals addressed during  session: Mobility/safety with mobility OT goals addressed during session: ADL's and self-care       AM-PAC PT "6 Clicks" Mobility  Outcome Measure Help needed turning from your back to your side while in a flat bed without using bedrails?: A Lot Help needed moving from lying on your back to sitting on the side of a flat bed without using bedrails?: Total Help needed moving to and from a bed to a chair (including a wheelchair)?: Total Help needed standing up from a chair using your arms (e.g., wheelchair or bedside chair)?: Total Help needed to walk in hospital room?: Total Help needed climbing 3-5 steps with a railing? : Total 6 Click Score: 7    End of Session   Activity Tolerance: Patient limited by fatigue Patient left: in bed;with call bell/phone within reach;with bed alarm set Nurse Communication: Mobility status PT Visit Diagnosis: Muscle weakness (generalized) (M62.81);Unsteadiness on feet (R26.81)    Time: 4034-7425 PT Time Calculation (min) (ACUTE ONLY): 20 min   Charges:   PT Evaluation $PT Eval Low Complexity: 1 Low   PT General Charges $$ ACUTE PT VISIT: 1 Visit         Donna Bernard, PT, MPT   Ina Homes 12/25/2022, 11:59 AM

## 2022-12-25 NOTE — Progress Notes (Signed)
[facility]  ROUNDING NOTE   Subjective:   Patient resting in bed Alert States he feels well  3L [place]  Furosemide drip increased to 8mg /hr  Objective:  Vital signs in last 24 hours:  Temp:  [97.6 F (36.4 C)-98.5 F (36.9 C)] 98.4 F (36.9 C) (07/11 0805) Pulse Rate:  [76-88] 88 (07/11 0805) Resp:  [18-24] 24 (07/11 0805) BP: (120-157)/(74-99) 145/99 (07/11 0805) SpO2:  [91 %-98 %] 92 % (07/11 0805)  Weight change:  Filed Weights   12/22/22 1900  Weight: 127.1 kg    Intake/Output: I/O last 3 completed shifts: In: 1103.4 [P.O.:960; I.V.:143.4] Out: 2550 [Urine:2550]   Intake/Output this shift:  Total I/O In: -  Out: 400 [Urine:400]  Physical Exam: General: NAD  Head: Normocephalic, atraumatic. Moist oral mucosal membranes  Eyes: Anicteric  Lungs:  Clear to auscultation, [place] O2  Heart: Regular rate and rhythm  Abdomen:  Soft, nontender, obese  Extremities: 2+ peripheral edema.  Neurologic: Nonfocal, moving all four extremities  Skin: No lesions  Access: None    Basic Metabolic Panel: Recent Labs  Lab 12/22/22 1620 12/23/22 0129 12/24/22 0443 12/25/22 0602  NA 135 135 137 139  K 4.8 5.1 5.0 4.5  CL 97* 98 98 96*  CO2 26 30 28  34*  GLUCOSE 104* 138* 112* 128*  BUN 34* 39* 41* 41*  CREATININE 2.47* 2.45* 1.91* 1.46*  CALCIUM 8.7* 8.5* 8.7* 8.8*  PHOS  --   --   --  3.8    Liver Function Tests: Recent Labs  Lab 12/25/22 0602  ALBUMIN 3.4*   No results for input(s): "LIPASE", "AMYLASE" in the last 168 hours. No results for input(s): "AMMONIA" in the last 168 hours.  CBC: Recent Labs  Lab 12/22/22 1620 12/23/22 0243 12/24/22 0443 12/25/22 0602  WBC 5.6 7.7 8.1 9.1  HGB 14.3 14.3 14.1 13.5  HCT 45.8 47.8 46.3 45.7  MCV 100.2* 103.5* 105.0* 103.9*  PLT 274 239 210 194    Cardiac Enzymes: No results for input(s): "CKTOTAL", "CKMB", "CKMBINDEX", "TROPONINI" in the last 168 hours.  BNP: Invalid input(s):  "POCBNP"  CBG: No results for input(s): "GLUCAP" in the last 168 hours.  Microbiology: Results for orders placed or performed during the hospital encounter of 01/01/21  Resp Panel by RT-PCR (Flu A&B, Covid) Nasopharyngeal Swab     Status: None   Collection Time: 01/01/21  4:57 PM   Specimen: Nasopharyngeal Swab; Nasopharyngeal(NP) swabs in vial transport medium  Result Value Ref Range Status   SARS Coronavirus 2 by RT PCR NEGATIVE NEGATIVE Final    Comment: (NOTE) SARS-CoV-2 target nucleic acids are NOT DETECTED.  The SARS-CoV-2 RNA is generally detectable in upper respiratory specimens during the acute phase of infection. The lowest concentration of SARS-CoV-2 viral copies this assay can detect is 138 copies/mL. A negative result does not preclude SARS-Cov-2 infection and should not be used as the sole basis for treatment or other patient management decisions. A negative result may occur with  improper specimen collection/handling, submission of specimen other than nasopharyngeal swab, presence of viral mutation(s) within the areas targeted by this assay, and inadequate number of viral copies(<138 copies/mL). A negative result must be combined with clinical observations, patient history, and epidemiological information. The expected result is Negative.  Fact Sheet for Patients:  BloggerCourse.com  Fact Sheet for Healthcare Providers:  SeriousBroker.it  This test is no t yet approved or cleared by the [place] FDA and  has been authorized for detection  and/or diagnosis of SARS-CoV-2 by FDA under an Emergency Use Authorization (EUA). This EUA will remain  in effect (meaning this test can be used) for the duration of the COVID-19 declaration under Section 564(b)(1) of the Act, 21 U.S.C.section 360bbb-3(b)(1), unless the authorization is terminated  or revoked sooner.       Influenza A by PCR NEGATIVE NEGATIVE Final    Influenza B by PCR NEGATIVE NEGATIVE Final    Comment: (NOTE) The Xpert Xpress SARS-CoV-2/FLU/RSV plus assay is intended as an aid in the diagnosis of influenza from Nasopharyngeal swab specimens and should not be used as a sole basis for treatment. Nasal washings and aspirates are unacceptable for Xpert Xpress SARS-CoV-2/FLU/RSV testing.  Fact Sheet for Patients: BloggerCourse.com  Fact Sheet for Healthcare Providers: SeriousBroker.it  This test is not yet approved or cleared by the [place] FDA and has been authorized for detection and/or diagnosis of SARS-CoV-2 by FDA under an Emergency Use Authorization (EUA). This EUA will remain in effect (meaning this test can be used) for the duration of the COVID-19 declaration under Section 564(b)(1) of the Act, 21 U.S.C. section 360bbb-3(b)(1), unless the authorization is terminated or revoked.  Performed at [facility], [address] [place], [place] [address]     Coagulation Studies: No results for input(s): "LABPROT", "INR" in the last 72 hours.  Urinalysis: Recent Labs    12/23/22 0650  COLORURINE AMBER*  LABSPEC 1.006  PHURINE 6.0  GLUCOSEU NEGATIVE  HGBUR MODERATE*  BILIRUBINUR NEGATIVE  KETONESUR NEGATIVE  PROTEINUR 100*  NITRITE NEGATIVE  LEUKOCYTESUR SMALL*      Imaging: NM Pulmonary Perfusion  Result Date: 12/23/2022 CLINICAL DATA:  Hypoxia, elevated D-dimer, clinical suspicion for PE EXAM: NUCLEAR MEDICINE PERFUSION LUNG SCAN TECHNIQUE: Perfusion images were obtained in multiple projections after intravenous injection of radiopharmaceutical. Ventilation scans intentionally deferred if perfusion scan and chest x-ray adequate for interpretation during COVID 19 epidemic. RADIOPHARMACEUTICALS:  4.25 mCi Tc-23m MAA IV COMPARISON:  Chest radiographs none on 12/22/2022 FINDINGS: There are no discrete wedge-shaped perfusion defects. There are  moderate-sized nonsegmental foci off decreased tracer uptake in the posterior mid and lower lung fields, more so on the left side. Evaluation is limited without ventilation images. There are no focal infiltrates in the lung fields in the areas of decreased perfusion. IMPRESSION: There are moderate sized areas of decreased tracer uptake in the posterior mid and lower lung fields, more so on the left side. No discrete wedge-shaped perfusion defects are seen.Study is inconclusive to evaluate for PE. If clinically warranted, follow-up CT pulmonary angiogram may be considered. Electronically Signed   By: Ernie Avena M.D.   On: 12/23/2022 17:43   US RENAL  Result Date: 12/23/2022 CLINICAL DATA:  Acute kidney failure EXAM: RENAL / URINARY TRACT ULTRASOUND COMPLETE COMPARISON:  CT angiogram abdomen and pelvis December 14, 2015 FINDINGS: Right Kidney: Renal measurements: 10.7 x 5.6 x 5.8 cm = volume: 182.0 mL. Echogenicity within normal limits. No mass or hydronephrosis visualized. Left Kidney: Renal measurements: 11.3 x 6.3 x 5.3 cm = volume: 198.1 mL. Echogenicity within normal limits. No hydronephrosis. There is an exophytic benign cyst originating from the mid-pole, measuring 3.4 x 2.3 x 3.1 cm. Bladder: Appears normal for degree of bladder distention. Other: None. IMPRESSION: No acute findings. Electronically Signed   By: Jacob Moores M.D.   On: 12/23/2022 12:21     Medications:    furosemide (LASIX) 200 mg in dextrose 5 % 100 mL (2 mg/mL) infusion 8 mg/hr (  12/24/22 0958)   heparin 1,100 Units/hr (12/24/22 1449)    aspirin EC  81 mg Oral Daily   atorvastatin  40 mg Oral QHS   heparin  1,600 Units Intravenous Once   hydrALAZINE  37.5 mg Oral TID   isosorbide mononitrate  30 mg Oral Daily   acetaminophen **OR** acetaminophen, hydrALAZINE, ondansetron **OR** ondansetron (ZOFRAN) IV, senna-docusate  Assessment/ Plan:  Mr. MATAN STEEN is a 67 y.o.  male with a PMHx of chronic diastolic heart  failure, morbid obesity, atrial fibrillation, history of IVC placement, history of recurrent DVT, hyperlipidemia, hypertension, who was admitted to [facility] on 12/22/2022 for Elevated d-dimer [R79.89] Acute hypoxemic respiratory failure (HCC) [J96.01] Acute on chronic congestive heart failure, unspecified heart failure type (HCC) [I50.9]   Acute kidney injury/chronic kidney disease stage II baseline creatinine 1.18 with EGFR 69. Suspect that the patient's acute kidney injury is related to cardiorenal syndrome. Renal ultrasound negative for hydronephrosis.   Creatinine continues to improve. Adequate UOP with furosemide drip.   Lab Results  Component Value Date   CREATININE 1.46 (H) 12/25/2022   CREATININE 1.91 (H) 12/24/2022   CREATININE 2.45 (H) 12/23/2022    Intake/Output Summary (Last 24 hours) at 12/25/2022 1139 Last data filed at 12/25/2022 0703 Gross per 24 hour  Intake 960 ml  Output 2300 ml  Net -1340 ml    2.  Acute on chronic diastolic heart failure.  Significantly elevated BNP of 2212.  Patient started on Lasix injections.  Furosemide drip increased to 8mg /hr yesterday per Cardiology.     LOS: 3 Jori Frerichs 7/11/202411:39 AM

## 2022-12-26 ENCOUNTER — Other Ambulatory Visit: Payer: Self-pay

## 2022-12-26 ENCOUNTER — Inpatient Hospital Stay: Payer: Medicare Other

## 2022-12-26 DIAGNOSIS — R001 Bradycardia, unspecified: Secondary | ICD-10-CM

## 2022-12-26 DIAGNOSIS — I5023 Acute on chronic systolic (congestive) heart failure: Secondary | ICD-10-CM | POA: Diagnosis not present

## 2022-12-26 DIAGNOSIS — I493 Ventricular premature depolarization: Secondary | ICD-10-CM

## 2022-12-26 DIAGNOSIS — J9601 Acute respiratory failure with hypoxia: Secondary | ICD-10-CM | POA: Diagnosis not present

## 2022-12-26 LAB — BLOOD GAS, VENOUS
Acid-Base Excess: 21.4 mmol/L — ABNORMAL HIGH (ref 0.0–2.0)
Bicarbonate: 48.8 mmol/L — ABNORMAL HIGH (ref 20.0–28.0)
O2 Saturation: 96.8 %
Patient temperature: 37
pCO2, Ven: 64 mmHg — ABNORMAL HIGH (ref 44–60)
pH, Ven: 7.49 — ABNORMAL HIGH (ref 7.25–7.43)
pO2, Ven: 72 mmHg — ABNORMAL HIGH (ref 32–45)

## 2022-12-26 LAB — CBC
HCT: 41.3 % (ref 39.0–52.0)
Hemoglobin: 13.2 g/dL (ref 13.0–17.0)
MCH: 31.2 pg (ref 26.0–34.0)
MCHC: 32 g/dL (ref 30.0–36.0)
MCV: 97.6 fL (ref 80.0–100.0)
Platelets: 188 10*3/uL (ref 150–400)
RBC: 4.23 MIL/uL (ref 4.22–5.81)
RDW: 16.7 % — ABNORMAL HIGH (ref 11.5–15.5)
WBC: 10.3 10*3/uL (ref 4.0–10.5)
nRBC: 0 % (ref 0.0–0.2)

## 2022-12-26 LAB — BASIC METABOLIC PANEL
Anion gap: 10 (ref 5–15)
BUN: 36 mg/dL — ABNORMAL HIGH (ref 8–23)
CO2: 39 mmol/L — ABNORMAL HIGH (ref 22–32)
Calcium: 9.1 mg/dL (ref 8.9–10.3)
Chloride: 92 mmol/L — ABNORMAL LOW (ref 98–111)
Creatinine, Ser: 1.24 mg/dL (ref 0.61–1.24)
GFR, Estimated: >60 mL/min (ref >60)
Glucose, Bld: 98 mg/dL (ref 70–99)
Potassium: 3.4 mmol/L — ABNORMAL LOW (ref 3.5–5.1)
Sodium: 141 mmol/L (ref 135–145)

## 2022-12-26 LAB — APTT
aPTT: 60 seconds — ABNORMAL HIGH (ref 24–36)
aPTT: 73 seconds — ABNORMAL HIGH (ref 24–36)

## 2022-12-26 LAB — HEPARIN LEVEL (UNFRACTIONATED): Heparin Unfractionated: 1.04 IU/mL — ABNORMAL HIGH (ref 0.30–0.70)

## 2022-12-26 MED ORDER — METOPROLOL TARTRATE 5 MG/5ML IV SOLN
5.0000 mg | INTRAVENOUS | Status: DC | PRN
  Administered 2022-12-26 – 2022-12-27 (×5): 5 mg via INTRAVENOUS
  Filled 2022-12-26 (×5): qty 5

## 2022-12-26 MED ORDER — POTASSIUM CHLORIDE CRYS ER 20 MEQ PO TBCR
40.0000 meq | EXTENDED_RELEASE_TABLET | Freq: Once | ORAL | Status: AC
  Administered 2022-12-26: 40 meq via ORAL
  Filled 2022-12-26: qty 2

## 2022-12-26 MED ORDER — DILTIAZEM HCL 25 MG/5ML IV SOLN
5.0000 mg | Freq: Once | INTRAVENOUS | Status: AC
  Administered 2022-12-26: 5 mg via INTRAVENOUS
  Filled 2022-12-26: qty 5

## 2022-12-26 MED ORDER — IOHEXOL 350 MG/ML SOLN
75.0000 mL | Freq: Once | INTRAVENOUS | Status: AC | PRN
  Administered 2022-12-26: 75 mL via INTRAVENOUS

## 2022-12-26 MED ORDER — HEPARIN BOLUS VIA INFUSION
1600.0000 [IU] | Freq: Once | INTRAVENOUS | Status: AC
  Administered 2022-12-26: 1600 [IU] via INTRAVENOUS
  Filled 2022-12-26: qty 1600

## 2022-12-26 MED ORDER — APIXABAN 5 MG PO TABS
5.0000 mg | ORAL_TABLET | Freq: Two times a day (BID) | ORAL | Status: DC
  Administered 2022-12-26 – 2022-12-31 (×11): 5 mg via ORAL
  Filled 2022-12-26 (×11): qty 1

## 2022-12-26 NOTE — TOC Progression Note (Signed)
Transition of Care [facility]) - Progression Note    Patient Details  Name: Frederick Perry MRN: [id number] Date of Birth: January 17, 1956  Transition of Care [facility]) CM/SW Contact  Margarito Liner, LCSW Phone Number: 12/26/2022, 11:37 AM  Clinical Narrative:   Per PT, patient still needs to talk to son about recommendations.  Expected Discharge Plan:  (TBD) Barriers to Discharge: Continued Medical Work up  Expected Discharge Plan and Services     Post Acute Care Choice:  (TBD) Living arrangements for the past 2 months:  (Truck)                                       Social Determinants of Health (SDOH) Interventions SDOH Screenings   Food Insecurity: No Food Insecurity (12/23/2022)  Housing: Low Risk  (12/23/2022)  Transportation Needs: No Transportation Needs (12/23/2022)  Utilities: Not At Risk (12/23/2022)  Financial Resource Strain: Low Risk  (12/22/2022)   [facility] from [facility], [facility]  Physical Activity: Unknown (02/04/2018)  Social Connections: Unknown (02/04/2018)  Tobacco Use: High Risk (12/23/2022)    Readmission Risk Interventions     No data to display

## 2022-12-26 NOTE — Progress Notes (Deleted)
Patient has heart rate between 120' - 140's, MD made aware of IV lopressor 5mg  every 4 hours PRN ordered and given.

## 2022-12-26 NOTE — Progress Notes (Signed)
Patient's heart rate has been in between 120's -140's, MD made aware of, IV Lopressor 5 mg every 4 hours PRN was ordered and given.

## 2022-12-26 NOTE — Consult Note (Signed)
ANTICOAGULATION CONSULT NOTE  Pharmacy Consult for Heparin Infusion Indication: chest pain/ACS, apixaban PTA for DVT/PE  Patient Measurements: Height: 6' 1.5" (186.7 cm) Weight: 127.1 kg (280 lb 3.3 oz) IBW/kg (Calculated) : 81.06 Heparin Dosing Weight: 109.1 kg  Labs: Recent Labs    12/24/22 0115 12/24/22 0443 12/24/22 0443 12/25/22 0602 12/25/22 1810 12/25/22 2355 12/26/22 0448 12/26/22 0711  HGB  --  14.1   < > 13.5  --   --  13.2  --   HCT  --  46.3  --  45.7  --   --  41.3  --   PLT  --  210  --  194  --   --  188  --   APTT 73*  --   --  54* 66* 60*  --  73*  HEPARINUNFRC >1.10*  --   --  >1.10*  --   --   --  1.04*  CREATININE  --  1.91*  --  1.46*  --   --  1.24  --    < > = values in this interval not displayed.    Estimated Creatinine Clearance: 83.6 mL/min (by C-G formula based on SCr of 1.24 mg/dL).   Medical History: Past Medical History:  Diagnosis Date   Bladder cancer (HCC)    a. 12/2015 s/p resection.   CKD (chronic kidney disease), stage II    Coronary artery disease    a. 2017 Cath: occ OM2, occ PDA (both being filled via collats), mod dzs in LAD->med rx; b. 12/2020 Cath: LM nl, LAD 70p, 3m - dLAD fills via R->L collats. RI 100 CTO - fills via collats from OM1. LCX 31m/d. OM2 100 CTO. RCA 83m, 85d. RPDA 100 - fills via collats from OM3. EF 30-35%->med rx.   HFrEF (heart failure with reduced ejection fraction) (HCC)    a. 08/2015 Echo: EF 50-55%; b. 08/2019 Echo: EF 30-35%; c. 12/2020 Echo: EF 30-35%, GrII DD. Sev apical septal, ant, inf HK. Nl RV fxn. Mild MR. Triv AI. Mild-mod Ao sclerosis.   History of DVT (deep vein thrombosis)    a. 08/2015 - s/p IVC filter; b. 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   Hyperlipidemia LDL goal <70    Hypertension    Not on medications.stopped since he ran out of medications   IDA (iron deficiency anemia)    Ischemic cardiomyopathy    a. 08/2015 Echo: EF 50-55%; b. 08/2019 Echo: EF 30-35%; c. 12/2020 Echo: EF  30-35%.   Myocardial infarction [facility])    Perforated bowel (HCC)    a. 11/2015 s/p surgical repair.   Polysubstance abuse (HCC)    a. ongoing tobacco and alcohol abuse    Pulmonary embolism (HCC)    Type II diabetes mellitus (HCC)     Medications:  PTA: apixaban 5 mg BID - last dose: 7/8 AM per patient  Assessment: 67 y.o. male  with history of ischemic cardiomyopathy, CHF, HTN, DVT/PE s/p IVC filter on Eliquis presenting to the emergency department from clinic for evaluation of hypoxia and BLLE edema. Initial troponin I 201. Pharmacy has been consulted to initiate and manage IV heparin therapy.    07/08 0243 aPTT 132, supratherapeutic 07/08 1147 aPTT 114, supratherapeutic  07/08 1823 aPTT 84, therapeutic 07/09 0115 aPTT 73, therapeutic x 2 / HL > 1.10 07/11 0602 aPTT 54, HL > 1.1 07/11 1810 aPTT 66, therapeutic x 1 07/11 2355 aPTT 60, subtherapeutic 07/12 0711 aPTT 73, HL 1.04   Goal of Therapy:  Heparin level 0.3-0.7 units/ml aPTT 66-102 seconds Monitor platelets by anticoagulation protocol: Yes   Plan:  aPTT is therapeutic. Will continue heparin infusion at 1500 units/hr. Recheck aPTT in 6 hours. Heparin level and CBC with AM labs. Switch to heparin level monitor once aPTT and heparin level correlate.     Paschal Dopp, PharmD,  12/26/2022 7:43 AM

## 2022-12-26 NOTE — Progress Notes (Signed)
Physical Therapy Treatment Patient Details Name: Frederick Perry MRN: [id number] DOB: [birth year] Today's Date: 12/26/2022   History of Present Illness Patient is a 67 year old male with heart failure reduced ejection fraction, morbid obesity, atrial fibrillation status post IVC filter placement in 2017 with recurrent DVT this patient is on Eliquis, hyperlipidemia, hypertension, who presents to the emergency department for chief concerns of swelling of bilateral lower extremities.    PT Comments  Pt seen for PT tx with pt asleep but easily awakened & agreeable. Pt is able to complete bed mobility with supervision with HOB elevated, use of bed rails, cuing for technique. Pt progresses to STS from EOB with RW & CGA, step pivot bed>recliner with RW & CGA. Pt is limited by elevated HR (max HR 166 bpm, pt asymptomatic) - MD aware, ordered CT for pt, cleared pt for participation with PT during session. Recommend ongoing PT services to progress gait as able when HR improved.      Assistance Recommended at Discharge Intermittent Supervision/Assistance  If plan is discharge home, recommend the following:  Can travel by private vehicle    Assistance with cooking/housework;Help with stairs or ramp for entrance;Assist for transportation;A little help with walking and/or transfers;A little help with bathing/dressing/bathroom   No  Equipment Recommendations  Rolling walker (2 wheels)    Recommendations for Other Services       Precautions / Restrictions Precautions Precautions: Fall Precaution Comments: watch HR Restrictions Weight Bearing Restrictions: No     Mobility  Bed Mobility Overal bed mobility: Needs Assistance Bed Mobility: Supine to Sit     Supine to sit: Supervision, HOB elevated (use of bed rails, cuing for technique)          Transfers Overall transfer level: Needs assistance Equipment used: Rolling walker (2 wheels) Transfers: Sit to/from Stand, Bed to  chair/wheelchair/BSC Sit to Stand: Min guard   Step pivot transfers: Min guard       General transfer comment: education re: hand placement during STS transfers with RW    Ambulation/Gait Ambulation/Gait assistance:  (deferred 2/2 elevated HR)                 Stairs             Wheelchair Mobility     Tilt Bed    Modified Rankin (Stroke Patients Only)       Balance Overall balance assessment: Needs assistance Sitting-balance support: Feet supported, No upper extremity supported Sitting balance-Leahy Scale: Good     Standing balance support: Bilateral upper extremity supported, During functional activity, Reliant on assistive device for balance Standing balance-Leahy Scale: Fair                              Cognition Arousal/Alertness: Awake/alert Behavior During Therapy: WFL for tasks assessed/performed Overall Cognitive Status: Within Functional Limits for tasks assessed                                 General Comments: follows simple commands throughout session        Exercises      General Comments General comments (skin integrity, edema, etc.): Pt on 2L/min via nasal cannula throughout session, max HR 166 bpm, pt asymptomatic      Pertinent Vitals/Pain Pain Assessment Pain Assessment: Faces Faces Pain Scale: Hurts a little bit Pain Location: back, shoulders Pain Descriptors / Indicators: Discomfort  Pain Intervention(s): Repositioned    Home Living                          Prior Function            PT Goals (current goals can now be found in the care plan section) Acute Rehab PT Goals Patient Stated Goal: to return to prior level of independence PT Goal Formulation: With patient Time For Goal Achievement: 01/08/23 Potential to Achieve Goals: Good Progress towards PT goals: Progressing toward goals    Frequency    Min 1X/week      PT Plan Current plan remains appropriate     Co-evaluation              AM-PAC PT "6 Clicks" Mobility   Outcome Measure  Help needed turning from your back to your side while in a flat bed without using bedrails?: A Little Help needed moving from lying on your back to sitting on the side of a flat bed without using bedrails?: A Little Help needed moving to and from a bed to a chair (including a wheelchair)?: A Little Help needed standing up from a chair using your arms (e.g., wheelchair or bedside chair)?: A Little Help needed to walk in hospital room?: A Little Help needed climbing 3-5 steps with a railing? : A Lot 6 Click Score: 17    End of Session Equipment Utilized During Treatment: Oxygen Activity Tolerance: Treatment limited secondary to medical complications (Comment) (limited 2/2 elevated HR) Patient left: in chair;with chair alarm set;with call bell/phone within reach Nurse Communication: Mobility status PT Visit Diagnosis: Muscle weakness (generalized) (M62.81);Unsteadiness on feet (R26.81);Difficulty in walking, not elsewhere classified (R26.2)     Time: 1610-9604 PT Time Calculation (min) (ACUTE ONLY): 21 min  Charges:    $Therapeutic Activity: 8-22 mins PT General Charges $$ ACUTE PT VISIT: 1 Visit                     Aleda Grana, PT, DPT 12/26/22, 10:54 AM   Sandi Mariscal 12/26/2022, 10:52 AM

## 2022-12-26 NOTE — Progress Notes (Signed)
Progress Note   Patient: Frederick Perry JXB:[id number] DOB: Feb 19, 1956 DOA: 12/22/2022     4 DOS: the patient was seen and examined on 12/26/2022    Brief hospital course: From HPI: "[name] is a 67 year old male with heart failure reduced ejection fraction, morbid obesity, atrial fibrillation status post IVC filter placement in 2017 with recurrent DVT this patient is on Eliquis, hyperlipidemia, hypertension, who presents to the emergency department for chief concerns of swelling of bilateral lower extremities. Vitals in the ED showed temperature of 98, respiration rate of 22, heart rate of 100, improved to 83, blood pressure 134/95, SpO2 of 88% on room air.  BNP was elevated at 2212.1.  High sensitive troponin was elevated 201--270--273.  D-dimer was elevated at 6.14.  VQ scan requested pending"   Further hospital course and management as outlined below.    Assessment and Plan:  Acute respiratory failure with hypoxia and hypercapnia Etiology workup in progress, differentials include heart failure exacerbation however given chest x-ray were unremarkable, and markedly elevated D-dimer, PE cannot be excluded at this time Unable to obtain CTA of the chest on admission as renal function does not allow VQ scan performed inconclusive Lower extremity Doppler negative for DVTs CTA chest 7/12 negative for PE 7/11 - VBG obtained due to lethargy - pCO2 88 - bipap ordered by pt refused it 7/12 - mentation improved, pCO2 improved without bipap --Supplement O2 to maintain spO2 > 90%, wean as tolerated  Acute on Chronic HFrEF (heart failure with reduced ejection fraction) (HCC) Appears to be in acute decompensation on admission Echocardiogram 12/22/22 - EF 50-55%, grade II DD, + LV WMA's, mild MR, late systolic MV prolapse BNP significantly elevated 2212 Undergoing diuresis with Lasix drip Net IO Since Admission: -9,546.99 mL [12/26/22 1612]  --Cardiology and Nephrology  consulted --Continue Lasix drip, currently at 8 mg/hr  --Given metolazone also on 7/11 --Monitor daily weights, strict I/O's --Monitor renal function, electrolytes --Continue hydralazine, Imdur --Holding Coreg   Sinus tachycardia - HR 120's to as high as 140 overnight and this AM.   PE ruled out.  No clear evidence of infection to suggest sepsis. Has moderate R pleural effusion and compressive atelectasis, likely contributing. ?if getting intravascularly dry with diuresis at this point, on exam still seems volume overloaded --Determine & address underlying cause --Coreg is on hold due to acute CHF --PRN IV metoprolol  Acute metabolic encephalopathy - due to hypercapnic respiratory failure --BiPAP for ventilation --Monitor blood gas --Delirium precautions   Elevated troponin due to demand ischemia in setting of decompensated CHF No chest pain. Cardiology following Telemetry monitoring VQ scan inconclusive for PE CTA negative for PE  Acute kidney injury - improving Due to cardiorenal syndrome in setting of CHF decompensation Patient presented with creatinine 2.47 however last creatinine was 1.11-year ago Cr 2.45 >> 1.91 >> 1.46 >> 1.24 today --Nephrology  consulted --Renally dose meds, avoid nephrotoxins --Monitor BMP  History of pulmonary embolism (HCC) Patient endorses that his IVC filter from 2017 has never been removed Patient endorses compliance with home Eliquis, VQ scan was inconclusive CTA chest 7/12 negative for PE Doppler of lower extremity did not show any evidence of DVT --Transition from heparin drip >> [name]  Coronary artery disease Continue atorvastatin, aspirin resumed on admission   Essential hypertension Hydralazine 37.5 mg PO TID Hydralazine 5 mg IV PRN Coreg held due to acute CHF   Hyperlipidemia Continue atorvastatin   Obesity Body mass index is 36.46 kg/m. Complicates overall care  and prognosis.   Recommend lifestyle modifications  including physical activity and diet for weight loss and overall long-term health.     Subjective . Interval History Pt seated edge of bed working with PT when seen this AM.  Reports feeling okay, just having an intermittent cough.  Denies fever, chills, congestion, sore throat, chest pain at rest or with deep inspirations, denies shortness of breath.       DVT prophylaxis: heparin per pharmacy   Code Status: full code  Diet: heart healthy    Physical Exam:  General exam: awake & alert, no acute distress, working with PT HEENT: moist mucus membranes, hearing grossly normal  Respiratory system: CTAB, diminished right base, no wheezes or rhonchi, normal respiratory effort Cardiovascular system: normal S1/S2, tachycardic, regular rhythm,  no pedal edema.   Gastrointestinal system: soft, NT, ND Central nervous system: A&Ox3, normal speech, grossly non-focal exam Extremities: no edema, normal tone Skin: dry, intact, normal temperature Psychiatry: normal mood, congruent affect    Data Reviewed: Notable labs & studies ---  K 3.4, Cl 92, bicarb 29, BUN 36, Cr improved 1.24.    VBG 7/11 - pH 7.31 / pCO2 86/ pO2 51 VBG 7/12 - pH 7.49 / pCO2 64 / pO2 72  VQ scan 7/10 -- inconclusive for PE  CTA chest 7/12 -- negative for PE.  Moderate R pleural effusion with compressive atelectasis   Family Communication: none   Disposition Plan: pending clinical course.   SNF recommended   Time spent: 48 minutes    Author: Pennie Banter, DO 12/26/2022 4:08 PM  For on call review [email or web address].

## 2022-12-26 NOTE — Progress Notes (Signed)
Rounding Note    Patient Name: Frederick Perry Date of Encounter: 12/26/2022  [facility] Cardiologist: Debbe Odea, MD   Subjective   Patient seen on rounds. Sitting in the recliner without complaints. Denies any chest pain or shortness of breath. Remains on furosemide infusion at 8 mg/hr. -6.5 L output in the last 24 hours.  Inpatient Medications    Scheduled Meds:  apixaban  5 mg Oral BID   aspirin EC  81 mg Oral Daily   atorvastatin  40 mg Oral QHS   dextromethorphan-guaiFENesin  1 tablet Oral BID   hydrALAZINE  37.5 mg Oral TID   isosorbide mononitrate  30 mg Oral Daily   Continuous Infusions:  furosemide (LASIX) 200 mg in dextrose 5 % 100 mL (2 mg/mL) infusion 8 mg/hr (12/25/22 2211)   PRN Meds: acetaminophen **OR** acetaminophen, hydrALAZINE, metoprolol tartrate, ondansetron **OR** ondansetron (ZOFRAN) IV, senna-docusate   Vital Signs    Vitals:   12/25/22 2309 12/26/22 0451 12/26/22 0733 12/26/22 1121  BP: (!) 144/78 133/83 128/80 118/86  Pulse: 62 (!) 123 (!) 140 (!) 131  Resp: 20 18 18 18   Temp: 98.5 F (36.9 C) 99 F (37.2 C) 98.2 F (36.8 C) 98.3 F (36.8 C)  TempSrc:    Oral  SpO2: 92% 95% 94% 95%  Weight:      Height:        Intake/Output Summary (Last 24 hours) at 12/26/2022 1441 Last data filed at 12/26/2022 1431 Gross per 24 hour  Intake 120 ml  Output 5775 ml  Net -5655 ml      12/22/2022    7:00 PM 01/22/2022    2:53 PM 02/06/2021   10:41 AM  Last 3 Weights  Weight (lbs) 280 lb 3.3 oz 250 lb 4 oz 245 lb  Weight (kg) 127.1 kg 113.513 kg 111.131 kg      Telemetry    Sinus noted on telemetry - Personally Reviewed  ECG    No new tracings - Personally Reviewed  Physical Exam   GEN: No acute distress.   Neck: No JVD Cardiac: RRR, no murmurs, rubs, or gallops.  Respiratory: Clear to auscultation bilaterally.Respirations are un labored on 2L of O2 via  GI: Soft, nontender, non-distended  MS: 2+ pitting edema; No  deformity.Compression stockings on bilateral Neuro:  Nonfocal  Psych: Normal affect   Labs    High Sensitivity Troponin:   Recent Labs  Lab 12/22/22 1620 12/22/22 1811 12/23/22 0129  TROPONINIHS 201* 270* 273*     Chemistry Recent Labs  Lab 12/24/22 0443 12/25/22 0602 12/26/22 0448  NA 137 139 141  K 5.0 4.5 3.4*  CL 98 96* 92*  CO2 28 34* 39*  GLUCOSE 112* 128* 98  BUN 41* 41* 36*  CREATININE 1.91* 1.46* 1.24  CALCIUM 8.7* 8.8* 9.1  ALBUMIN  --  3.4*  --   GFRNONAA 38* 53* >60  ANIONGAP 11 9 10     Lipids No results for input(s): "CHOL", "TRIG", "HDL", "LABVLDL", "LDLCALC", "CHOLHDL" in the last 168 hours.  Hematology Recent Labs  Lab 12/24/22 0443 12/25/22 0602 12/26/22 0448  WBC 8.1 9.1 10.3  RBC 4.41 4.40 4.23  HGB 14.1 13.5 13.2  HCT 46.3 45.7 41.3  MCV 105.0* 103.9* 97.6  MCH 32.0 30.7 31.2  MCHC 30.5 29.5* 32.0  RDW 17.0* 16.9* 16.7*  PLT 210 194 188   Thyroid  Recent Labs  Lab 12/25/22 1338  TSH 1.358    BNP Recent Labs  Lab 12/22/22 1620  BNP 2,212.1*    DDimer  Recent Labs  Lab 12/22/22 1659  DDIMER 6.14*     Radiology    CT Angio Chest Pulmonary Embolism (PE) W or WO Contrast  Result Date: 12/26/2022 CLINICAL DATA:  Pulmonary embolism suspected, high probability. EXAM: CT ANGIOGRAPHY CHEST WITH CONTRAST TECHNIQUE: Multidetector CT imaging of the chest was performed using the standard protocol during bolus administration of intravenous contrast. Multiplanar CT image reconstructions and MIPs were obtained to evaluate the vascular anatomy. RADIATION DOSE REDUCTION: This exam was performed according to the departmental dose-optimization program which includes automated exposure control, adjustment of the mA and/or kV according to patient size and/or use of iterative reconstruction technique. CONTRAST:  75mL OMNIPAQUE IOHEXOL 350 MG/ML SOLN COMPARISON:  V/Q scan 12/23/2022. CT angio of the abdomen and pelvis 12/14/2015 FINDINGS:  Cardiovascular: The heart is enlarged. Atherosclerotic calcifications are present at the aortic arch and great vessel origins. No aneurysm or focal stenosis is present. Atherosclerotic calcifications are present in the coronary arteries. Pulmonary artery opacification is excellent. No focal filling defects are present to suggest pulmonary embolus. Mediastinum/Nodes: No enlarged mediastinal, hilar, or axillary lymph nodes. Thyroid gland, trachea, and esophagus demonstrate no significant findings. Lungs/Pleura: A moderate-sized right pleural effusion is present. Associated right lower lobe and posterior right upper lobe atelectasis is present. Minimal atelectasis is present the left base. The lungs are otherwise clear. No pneumothorax is present. Upper Abdomen: Layering densities are present at the neck of the gallbladder without other inflammatory change. An exophytic water density lesion posteriorly from the left kidney has increased somewhat in size, now measuring 3.3 cm. Musculoskeletal: No chest wall abnormality. No acute or significant osseous findings. Review of the MIP images confirms the above findings. IMPRESSION: 1. No pulmonary embolus. 2. Moderate-sized right pleural effusion with associated right lower lobe and posterior right upper lobe atelectasis. 3. Cardiomegaly without failure. 4. Coronary artery disease. 5. Cholelithiasis without evidence of cholecystitis. 6. Simple cyst of the left kidney as described. Recommend no imaging follow-up. 7.  Aortic Atherosclerosis (ICD10-I70.0). Electronically Signed   By: Marin Roberts M.D.   On: 12/26/2022 10:07    Cardiac Studies  TTE 12/22/22  1. Left ventricular ejection fraction, by estimation, is 50 to 55%. The  left ventricle has low normal function. The left ventricle demonstrates  regional wall motion abnormalities (see scoring diagram/findings for  description). Left ventricular diastolic   parameters are consistent with Grade II diastolic  dysfunction  (pseudonormalization). Elevated left atrial pressure.   2. Right ventricular systolic function is mildly reduced. The right  ventricular size is mildly enlarged. There is mildly elevated pulmonary  artery systolic pressure.   3. Left atrial size was mildly dilated.   4. Right atrial size was mildly dilated.   5. The mitral valve is abnormal. Mild mitral valve regurgitation. No  evidence of mitral stenosis. There is mild late systolic prolapse of  posterior leaflet of the mitral valve. Cannot exclude a ruptured chord.   6. The aortic valve is tricuspid. There is moderate calcification of the  aortic valve. There is moderate thickening of the aortic valve. Aortic  valve regurgitation is not visualized. Aortic valve  sclerosis/calcification is present, without any evidence  of aortic stenosis.   7. The inferior vena cava is dilated in size with <50% respiratory  variability, suggesting right atrial pressure of 15 mmHg.   Patient Profile     67 y.o. 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  Assessment & Plan    Acute on chronic systolic and diastolic CHF -Echocardiogram with an LVEF of 50-55%, dilated IVC consistent with CHF, hypervolemia -Physical exam with anasarca and leg edema -30 pound weight gain -BNP 2200 -Remains on Lasix infusion at 8 mg/h --6.5 L output in the last 24 hours -Continue with CHF education -Not currently on ACE/ARB/ARNI in the setting of renal dysfunction -Daily weights, I's and O's, low-sodium diet  Acute kidney injury on CKD -Serum creatinine improved 1.24 -Presenting creatinine of 2.47 with slow improvement with diuresis likely cardiorenal syndrome -Received 1 dose of 2.5 mg of metolazone -Assessment findings consistent with  hypervolemia -Daily BMP -Monitor urine output -Monitor/trend/replete electrolytes as needed -Avoid nephrotoxic agents were able  CAD with elevated high sensitivity troponin -High-sensitivity troponin slightly elevated likely supply/demand mismatch in the setting of CHF -Prior ischemic workup in July 2022 heart catheterization requiring medical management -Continues to remain chest pain-free -EKG noted-T wave changes in inferior and lateral leads -Continued on aspirin, statin, and isosorbide -Consider outpatient ischemic workup  Essential hypertension -blood pressure 118/86 -continue on Imdur and hydralazine, PTA carvedilol currently on hold -vital signs per unit protocol  History of DVT/PE -IVC filter placed in 2017 -on Eliquis  -D-dimer elevated at 6 -Ultrasound of legs negative for DVT -VQ scan indeterminate  Hypokalemia -serum potassium 3.4 -given potassium supplement -daily BMP -monitor/trend/replete electrolytes as needed       For questions or updates, please contact  [facility] Please consult [email or web address] for contact info under        Signed, Iman Reinertsen, NP  12/26/2022, 2:41 PM

## 2022-12-26 NOTE — Consult Note (Signed)
ANTICOAGULATION CONSULT NOTE  Pharmacy Consult for Heparin Infusion Indication: chest pain/ACS, apixaban PTA for DVT/PE  Patient Measurements: Height: 6' 1.5" (186.7 cm) Weight: 127.1 kg (280 lb 3.3 oz) IBW/kg (Calculated) : 81.06 Heparin Dosing Weight: 109.1 kg  Labs: Recent Labs     0000 12/23/22 0129 12/23/22 0243 12/23/22 1147 12/24/22 0115 12/24/22 0443 12/25/22 0602 12/25/22 1810 12/25/22 2355  HGB   < >  --  14.3  --   --  14.1 13.5  --   --   HCT  --   --  47.8  --   --  46.3 45.7  --   --   PLT  --   --  239  --   --  210 194  --   --   APTT  --   --  132*   < > 73*  --  54* 66* 60*  HEPARINUNFRC  --   --   --   --  >1.10*  --  >1.10*  --   --   CREATININE  --  2.45*  --   --   --  1.91* 1.46*  --   --   TROPONINIHS  --  273*  --   --   --   --   --   --   --    < > = values in this interval not displayed.    Estimated Creatinine Clearance: 71 mL/min (A) (by C-G formula based on SCr of 1.46 mg/dL (H)).   Medical History: Past Medical History:  Diagnosis Date   Bladder cancer (HCC)    a. 12/2015 s/p resection.   CKD (chronic kidney disease), stage II    Coronary artery disease    a. 2017 Cath: occ OM2, occ PDA (both being filled via collats), mod dzs in LAD->med rx; b. 12/2020 Cath: LM nl, LAD 70p, 23m - dLAD fills via R->L collats. RI 100 CTO - fills via collats from OM1. LCX 76m/d. OM2 100 CTO. RCA 104m, 85d. RPDA 100 - fills via collats from OM3. EF 30-35%->med rx.   HFrEF (heart failure with reduced ejection fraction) (HCC)    a. 08/2015 Echo: EF 50-55%; b. 08/2019 Echo: EF 30-35%; c. 12/2020 Echo: EF 30-35%, GrII DD. Sev apical septal, ant, inf HK. Nl RV fxn. Mild MR. Triv AI. Mild-mod Ao sclerosis.   History of DVT (deep vein thrombosis)    a. 08/2015 - s/p IVC filter; b. 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   Hyperlipidemia LDL goal <70    Hypertension    Not on medications.stopped since he ran out of medications   IDA (iron deficiency anemia)     Ischemic cardiomyopathy    a. 08/2015 Echo: EF 50-55%; b. 08/2019 Echo: EF 30-35%; c. 12/2020 Echo: EF 30-35%.   Myocardial infarction [facility])    Perforated bowel (HCC)    a. 11/2015 s/p surgical repair.   Polysubstance abuse (HCC)    a. ongoing tobacco and alcohol abuse    Pulmonary embolism (HCC)    Type II diabetes mellitus (HCC)     Medications:  PTA: apixaban 5 mg BID - last dose: 7/8 AM per patient  Assessment: 67 y.o. male  with history of ischemic cardiomyopathy, CHF, HTN, DVT/PE s/p IVC filter on Eliquis presenting to the emergency department from clinic for evaluation of hypoxia and BLLE edema. Initial troponin I 201. Pharmacy has been consulted to initiate and manage IV heparin therapy.    07/08 0243 aPTT 132, supratherapeutic  07/08 1147 aPTT 114, supratherapeutic  07/08 1823 aPTT 84, therapeutic 07/09 0115 aPTT 73, therapeutic x 2 / HL > 1.10 07/11 0602 aPTT 54, HL > 1.1 07/11 1810 aPTT 66, therapeutic x 1 07/11 2355 aPTT 60, subtherapeutic  Goal of Therapy:  Heparin level 0.3-0.7 units/ml aPTT 66-102 seconds Monitor platelets by anticoagulation protocol: Yes   Plan:  --Bolus 1600 units x 1 --Increase heparin infusion rate to 1500 units/hr --Re-check aPTT in 6 hours after rate change and re-check heparin level tomorrow AM Follow aPTT until correlation established with heparin level and then switch over to heparin level monitoring only --Daily CBC per protocol while on IV heparin  Otelia Sergeant, PharmD, [facility] 12/26/2022 12:31 AM

## 2022-12-26 NOTE — Progress Notes (Signed)
Occupational Therapy Treatment Patient Details Name: Frederick Perry MRN: [id number] DOB: 04-12-1956 Today's Date: 12/26/2022   History of present illness Patient is a 67 year old male with heart failure reduced ejection fraction, morbid obesity, atrial fibrillation status post IVC filter placement in 2017 with recurrent DVT this patient is on Eliquis, hyperlipidemia, hypertension, who presents to the emergency department for chief concerns of swelling of bilateral lower extremities.   OT comments  Upon entering the room, pt seated in recliner chair and appears much more alert as well as oriented this session. Pt reports feeling much better and would like to end session remaining in recliner chair. Pt stands x 5 reps from recliner chair with min cuing for technique and hand placement with RW. Pt progresses from min guard - supervision with final stand. HR increased to 130's with activity. Pt ambulating 20' in room with RW with min guard for safety and balance. Pt returning to recliner chair. Pt reports if he is able to stay with son he would like to discharge to son's home. Pt making excellent progress towards therapy goals this session.    Recommendations for follow up therapy are one component of a multi-disciplinary discharge planning process, led by the attending physician.  Recommendations may be updated based on patient status, additional functional criteria and insurance authorization.    Assistance Recommended at Discharge Intermittent Supervision/Assistance  Patient can return home with the following  Two people to help with walking and/or transfers;A lot of help with bathing/dressing/bathroom;Assistance with cooking/housework;Assist for transportation;Direct supervision/assist for financial management;Direct supervision/assist for medications management;Help with stairs or ramp for entrance   Equipment Recommendations  Other (comment) (RW)       Precautions / Restrictions  Precautions Precautions: Fall Precaution Comments: watch HR       Mobility Bed Mobility               General bed mobility comments: seated in recliner chair    Transfers Overall transfer level: Needs assistance Equipment used: Rolling walker (2 wheels) Transfers: Sit to/from Stand Sit to Stand: Min guard                 Balance Overall balance assessment: Needs assistance Sitting-balance support: Feet supported, No upper extremity supported Sitting balance-Leahy Scale: Good     Standing balance support: Bilateral upper extremity supported, During functional activity, Reliant on assistive device for balance Standing balance-Leahy Scale: Fair                             ADL either performed or assessed with clinical judgement    Extremity/Trunk Assessment Upper Extremity Assessment Upper Extremity Assessment: Generalized weakness   Lower Extremity Assessment Lower Extremity Assessment: Generalized weakness        Vision Patient Visual Report: No change from baseline            Cognition Arousal/Alertness: Awake/alert Behavior During Therapy: WFL for tasks assessed/performed Overall Cognitive Status: Within Functional Limits for tasks assessed                                 General Comments: follows simple commands throughout session                   Pertinent Vitals/ Pain       Pain Assessment Pain Assessment: Faces Faces Pain Scale: Hurts a little bit Pain Location: R shoulder Pain  Descriptors / Indicators: Discomfort, Aching Pain Intervention(s): Repositioned, Monitored during session         Frequency  Min 1X/week        Progress Toward Goals  OT Goals(current goals can now be found in the care plan section)  Progress towards OT goals: Progressing toward goals     Plan Discharge plan remains appropriate;Frequency remains appropriate       AM-PAC OT "6 Clicks" Daily Activity     Outcome  Measure   Help from another person eating meals?: None Help from another person taking care of personal grooming?: None Help from another person toileting, which includes using toliet, bedpan, or urinal?: A Little Help from another person bathing (including washing, rinsing, drying)?: A Little Help from another person to put on and taking off regular upper body clothing?: None Help from another person to put on and taking off regular lower body clothing?: A Little 6 Click Score: 21    End of Session Equipment Utilized During Treatment: Rolling walker (2 wheels)  OT Visit Diagnosis: Unsteadiness on feet (R26.81);Muscle weakness (generalized) (M62.81)   Activity Tolerance Patient tolerated treatment well   Patient Left with call bell/phone within reach;in chair;with chair alarm set   Nurse Communication Mobility status        Time: 1478-2956 OT Time Calculation (min): 21 min  Charges: OT General Charges $OT Visit: 1 Visit OT Treatments $Therapeutic Activity: 8-22 mins  Jackquline Denmark, MS, OTR/L , CBIS ascom [phone]  12/26/22, 3:18 PM

## 2022-12-26 NOTE — Progress Notes (Signed)
[facility]  ROUNDING NOTE   Subjective:   Patient laying in bed States he feels better Respiratory status improved Complains of fatigue.   Furosemide drip 8mg /hr UOP 7 L  Objective:  Vital signs in last 24 hours:  Temp:  [98 F (36.7 C)-99 F (37.2 C)] 98.3 F (36.8 C) (07/12 1121) Pulse Rate:  [62-140] 131 (07/12 1121) Resp:  [18-25] 18 (07/12 1121) BP: (118-151)/(78-86) 118/86 (07/12 1121) SpO2:  [92 %-95 %] 95 % (07/12 1121)  Weight change:  Filed Weights   12/22/22 1900  Weight: 127.1 kg    Intake/Output: I/O last 3 completed shifts: In: 1440 [P.O.:1440] Out: 8350 [Urine:8350]   Intake/Output this shift:  Total I/O In: -  Out: 800 [Urine:800]  Physical Exam: General: NAD  Head: Normocephalic, atraumatic. Moist oral mucosal membranes  Eyes: Anicteric  Lungs:  Clear to auscultation, [place] O2  Heart: Regular rate and rhythm  Abdomen:  Soft, nontender, obese  Extremities: 2+ peripheral edema.  Neurologic: Nonfocal, moving all four extremities  Skin: No lesions  Access: None    Basic Metabolic Panel: Recent Labs  Lab 12/22/22 1620 12/23/22 0129 12/24/22 0443 12/25/22 0602 12/26/22 0448  NA 135 135 137 139 141  K 4.8 5.1 5.0 4.5 3.4*  CL 97* 98 98 96* 92*  CO2 26 30 28  34* 39*  GLUCOSE 104* 138* 112* 128* 98  BUN 34* 39* 41* 41* 36*  CREATININE 2.47* 2.45* 1.91* 1.46* 1.24  CALCIUM 8.7* 8.5* 8.7* 8.8* 9.1  PHOS  --   --   --  3.8  --     Liver Function Tests: Recent Labs  Lab 12/25/22 0602  ALBUMIN 3.4*   No results for input(s): "LIPASE", "AMYLASE" in the last 168 hours. Recent Labs  Lab 12/25/22 1338  AMMONIA 37*    CBC: Recent Labs  Lab 12/22/22 1620 12/23/22 0243 12/24/22 0443 12/25/22 0602 12/26/22 0448  WBC 5.6 7.7 8.1 9.1 10.3  HGB 14.3 14.3 14.1 13.5 13.2  HCT 45.8 47.8 46.3 45.7 41.3  MCV 100.2* 103.5* 105.0* 103.9* 97.6  PLT 274 239 210 194 188    Cardiac Enzymes: No results for input(s):  "CKTOTAL", "CKMB", "CKMBINDEX", "TROPONINI" in the last 168 hours.  BNP: Invalid input(s): "POCBNP"  CBG: No results for input(s): "GLUCAP" in the last 168 hours.  Microbiology: Results for orders placed or performed during the hospital encounter of 01/01/21  Resp Panel by RT-PCR (Flu A&B, Covid) Nasopharyngeal Swab     Status: None   Collection Time: 01/01/21  4:57 PM   Specimen: Nasopharyngeal Swab; Nasopharyngeal(NP) swabs in vial transport medium  Result Value Ref Range Status   SARS Coronavirus 2 by RT PCR NEGATIVE NEGATIVE Final    Comment: (NOTE) SARS-CoV-2 target nucleic acids are NOT DETECTED.  The SARS-CoV-2 RNA is generally detectable in upper respiratory specimens during the acute phase of infection. The lowest concentration of SARS-CoV-2 viral copies this assay can detect is 138 copies/mL. A negative result does not preclude SARS-Cov-2 infection and should not be used as the sole basis for treatment or other patient management decisions. A negative result may occur with  improper specimen collection/handling, submission of specimen other than nasopharyngeal swab, presence of viral mutation(s) within the areas targeted by this assay, and inadequate number of viral copies(<138 copies/mL). A negative result must be combined with clinical observations, patient history, and epidemiological information. The expected result is Negative.  Fact Sheet for Patients:  BloggerCourse.com  Fact Sheet for Healthcare Providers:  SeriousBroker.it  This test is no t yet approved or cleared by the [place] and  has been authorized for detection and/or diagnosis of SARS-CoV-2 by FDA under an Emergency Use Authorization (EUA). This EUA will remain  in effect (meaning this test can be used) for the duration of the COVID-19 declaration under Section 564(b)(1) of the Act, 21 U.S.C.section 360bbb-3(b)(1), unless the authorization  is terminated  or revoked sooner.       Influenza A by PCR NEGATIVE NEGATIVE Final   Influenza B by PCR NEGATIVE NEGATIVE Final    Comment: (NOTE) The Xpert Xpress SARS-CoV-2/FLU/RSV plus assay is intended as an aid in the diagnosis of influenza from Nasopharyngeal swab specimens and should not be used as a sole basis for treatment. Nasal washings and aspirates are unacceptable for Xpert Xpress SARS-CoV-2/FLU/RSV testing.  Fact Sheet for Patients: BloggerCourse.com  Fact Sheet for Healthcare Providers: SeriousBroker.it  This test is not yet approved or cleared by the [place] FDA and has been authorized for detection and/or diagnosis of SARS-CoV-2 by FDA under an Emergency Use Authorization (EUA). This EUA will remain in effect (meaning this test can be used) for the duration of the COVID-19 declaration under Section 564(b)(1) of the Act, 21 U.S.C. section 360bbb-3(b)(1), unless the authorization is terminated or revoked.  Performed at [facility], [address] [place], [place] [address]     Coagulation Studies: No results for input(s): "LABPROT", "INR" in the last 72 hours.  Urinalysis: No results for input(s): "COLORURINE", "LABSPEC", "PHURINE", "GLUCOSEU", "HGBUR", "BILIRUBINUR", "KETONESUR", "PROTEINUR", "UROBILINOGEN", "NITRITE", "LEUKOCYTESUR" in the last 72 hours.  Invalid input(s): "APPERANCEUR"     Imaging: CT Angio Chest Pulmonary Embolism (PE) W or WO Contrast  Result Date: 12/26/2022 CLINICAL DATA:  Pulmonary embolism suspected, high probability. EXAM: CT ANGIOGRAPHY CHEST WITH CONTRAST TECHNIQUE: Multidetector CT imaging of the chest was performed using the standard protocol during bolus administration of intravenous contrast. Multiplanar CT image reconstructions and MIPs were obtained to evaluate the vascular anatomy. RADIATION DOSE REDUCTION: This exam was performed according to the  departmental dose-optimization program which includes automated exposure control, adjustment of the mA and/or kV according to patient size and/or use of iterative reconstruction technique. CONTRAST:  75mL OMNIPAQUE IOHEXOL 350 MG/ML SOLN COMPARISON:  V/Q scan 12/23/2022. CT angio of the abdomen and pelvis 12/14/2015 FINDINGS: Cardiovascular: The heart is enlarged. Atherosclerotic calcifications are present at the aortic arch and great vessel origins. No aneurysm or focal stenosis is present. Atherosclerotic calcifications are present in the coronary arteries. Pulmonary artery opacification is excellent. No focal filling defects are present to suggest pulmonary embolus. Mediastinum/Nodes: No enlarged mediastinal, hilar, or axillary lymph nodes. Thyroid gland, trachea, and esophagus demonstrate no significant findings. Lungs/Pleura: A moderate-sized right pleural effusion is present. Associated right lower lobe and posterior right upper lobe atelectasis is present. Minimal atelectasis is present the left base. The lungs are otherwise clear. No pneumothorax is present. Upper Abdomen: Layering densities are present at the neck of the gallbladder without other inflammatory change. An exophytic water density lesion posteriorly from the left kidney has increased somewhat in size, now measuring 3.3 cm. Musculoskeletal: No chest wall abnormality. No acute or significant osseous findings. Review of the MIP images confirms the above findings. IMPRESSION: 1. No pulmonary embolus. 2. Moderate-sized right pleural effusion with associated right lower lobe and posterior right upper lobe atelectasis. 3. Cardiomegaly without failure. 4. Coronary artery disease. 5. Cholelithiasis without evidence of cholecystitis. 6. Simple cyst of the left kidney  as described. Recommend no imaging follow-up. 7.  Aortic Atherosclerosis (ICD10-I70.0). Electronically Signed   By: Marin Roberts M.D.   On: 12/26/2022 10:07     Medications:     furosemide (LASIX) 200 mg in dextrose 5 % 100 mL (2 mg/mL) infusion 8 mg/hr (12/25/22 2211)    apixaban  5 mg Oral BID   aspirin EC  81 mg Oral Daily   atorvastatin  40 mg Oral QHS   dextromethorphan-guaiFENesin  1 tablet Oral BID   hydrALAZINE  37.5 mg Oral TID   isosorbide mononitrate  30 mg Oral Daily   acetaminophen **OR** acetaminophen, hydrALAZINE, metoprolol tartrate, ondansetron **OR** ondansetron (ZOFRAN) IV, senna-docusate  Assessment/ Plan:  [name] is a 67 y.o.  male with a PMHx of chronic diastolic heart failure, morbid obesity, atrial fibrillation, history of IVC placement, history of recurrent DVT, hyperlipidemia, hypertension, who was admitted to [facility] on 12/22/2022 for Elevated d-dimer [R79.89] Acute hypoxemic respiratory failure (HCC) [J96.01] Acute on chronic congestive heart failure, unspecified heart failure type (HCC) [I50.9]   Acute kidney injury/chronic kidney disease stage II baseline creatinine 1.18 with EGFR 69. Suspect that the patient's acute kidney injury is related to cardiorenal syndrome. Renal ultrasound negative for hydronephrosis.   Creatinine almost at baseline. Recorded urine output of 7L. Continue current measures.   Lab Results  Component Value Date   CREATININE 1.24 12/26/2022   CREATININE 1.46 (H) 12/25/2022   CREATININE 1.91 (H) 12/24/2022    Intake/Output Summary (Last 24 hours) at 12/26/2022 1300 Last data filed at 12/26/2022 1120 Gross per 24 hour  Intake 240 ml  Output 6675 ml  Net -6435 ml    2.  Acute on chronic diastolic heart failure.  Significantly elevated BNP of 2212.  Patient started on Lasix injections.  Furosemide drip at 8mg /hr. Cardiology following.     LOS: 4 Aariv Medlock 7/12/20241:00 PM

## 2022-12-27 DIAGNOSIS — I10 Essential (primary) hypertension: Secondary | ICD-10-CM | POA: Diagnosis not present

## 2022-12-27 DIAGNOSIS — I5023 Acute on chronic systolic (congestive) heart failure: Secondary | ICD-10-CM | POA: Diagnosis not present

## 2022-12-27 DIAGNOSIS — J9601 Acute respiratory failure with hypoxia: Secondary | ICD-10-CM | POA: Diagnosis not present

## 2022-12-27 DIAGNOSIS — I25118 Atherosclerotic heart disease of native coronary artery with other forms of angina pectoris: Secondary | ICD-10-CM | POA: Diagnosis not present

## 2022-12-27 LAB — BASIC METABOLIC PANEL
BUN: 32 mg/dL — ABNORMAL HIGH (ref 8–23)
CO2: >45 mmol/L — ABNORMAL HIGH (ref 22–32)
Calcium: 9 mg/dL (ref 8.9–10.3)
Chloride: 86 mmol/L — ABNORMAL LOW (ref 98–111)
Creatinine, Ser: 1.15 mg/dL (ref 0.61–1.24)
GFR, Estimated: >60 mL/min (ref >60)
Glucose, Bld: 110 mg/dL — ABNORMAL HIGH (ref 70–99)
Potassium: 3.1 mmol/L — ABNORMAL LOW (ref 3.5–5.1)
Sodium: 142 mmol/L (ref 135–145)

## 2022-12-27 LAB — CBC
HCT: 43.1 % (ref 39.0–52.0)
Hemoglobin: 14 g/dL (ref 13.0–17.0)
MCH: 30.8 pg (ref 26.0–34.0)
MCHC: 32.5 g/dL (ref 30.0–36.0)
MCV: 94.9 fL (ref 80.0–100.0)
Platelets: 197 10*3/uL (ref 150–400)
RBC: 4.54 MIL/uL (ref 4.22–5.81)
RDW: 17 % — ABNORMAL HIGH (ref 11.5–15.5)
WBC: 9.3 10*3/uL (ref 4.0–10.5)
nRBC: 0 % (ref 0.0–0.2)

## 2022-12-27 LAB — MAGNESIUM: Magnesium: 1.4 mg/dL — ABNORMAL LOW (ref 1.7–2.4)

## 2022-12-27 MED ORDER — FUROSEMIDE 10 MG/ML IJ SOLN
60.0000 mg | Freq: Two times a day (BID) | INTRAMUSCULAR | Status: DC
  Administered 2022-12-27 – 2022-12-28 (×2): 60 mg via INTRAVENOUS
  Filled 2022-12-27 (×2): qty 6

## 2022-12-27 MED ORDER — MAGNESIUM OXIDE -MG SUPPLEMENT 400 (240 MG) MG PO TABS
400.0000 mg | ORAL_TABLET | Freq: Two times a day (BID) | ORAL | Status: AC
  Administered 2022-12-27 (×2): 400 mg via ORAL
  Filled 2022-12-27 (×2): qty 1

## 2022-12-27 MED ORDER — POTASSIUM CHLORIDE CRYS ER 20 MEQ PO TBCR
40.0000 meq | EXTENDED_RELEASE_TABLET | Freq: Two times a day (BID) | ORAL | Status: AC
  Administered 2022-12-27 (×2): 40 meq via ORAL
  Filled 2022-12-27 (×2): qty 2

## 2022-12-27 MED ORDER — MAGNESIUM SULFATE 2 GM/50ML IV SOLN
2.0000 g | Freq: Once | INTRAVENOUS | Status: AC
  Administered 2022-12-27: 2 g via INTRAVENOUS
  Filled 2022-12-27: qty 50

## 2022-12-27 MED ORDER — METOPROLOL TARTRATE 25 MG PO TABS
12.5000 mg | ORAL_TABLET | Freq: Two times a day (BID) | ORAL | Status: DC
  Administered 2022-12-27 (×2): 12.5 mg via ORAL
  Filled 2022-12-27 (×2): qty 1

## 2022-12-27 MED ORDER — ACETAZOLAMIDE 250 MG PO TABS
500.0000 mg | ORAL_TABLET | Freq: Two times a day (BID) | ORAL | Status: DC
  Administered 2022-12-27 – 2022-12-30 (×7): 500 mg via ORAL
  Filled 2022-12-27 (×7): qty 2

## 2022-12-27 NOTE — Progress Notes (Signed)
[facility]  ROUNDING NOTE   Subjective:   Laying in bed. States he is starting to feel better. Breathing better.   Furosemide drip 8mg /hr  UOP .   CO2 > 45 (39)  Objective:  Vital signs in last 24 hours:  Temp:  [97.2 F (36.2 C)-98.6 F (37 C)] 98.6 F (37 C) (07/13 1150) Pulse Rate:  [50-82] 53 (07/13 1150) Resp:  [15-25] 18 (07/13 1150) BP: (111-138)/(74-92) 113/74 (07/13 1150) SpO2:  [96 %-100 %] 96 % (07/13 1150) FiO2 (%):  [28 %] 28 % (07/12 2337)  Weight change:  Filed Weights   12/22/22 1900  Weight: 127.1 kg    Intake/Output: I/O last 3 completed shifts: In: 120 [P.O.:120] Out: 8825 [Urine:8825]   Intake/Output this shift:  Total I/O In: -  Out: 1100 [Urine:1100]  Physical Exam: General: NAD, laying in bed  Head: Normocephalic, atraumatic. Moist oral mucosal membranes  Eyes: Anicteric  Lungs:  Clear to auscultation, [place] 5 L O2  Heart: Regular rate and rhythm  Abdomen:  Soft, nontender, obese  Extremities: + peripheral edema.  Neurologic: Nonfocal, moving all four extremities  Skin: No lesions  Access: None    Basic Metabolic Panel: Recent Labs  Lab 12/23/22 0129 12/24/22 0443 12/25/22 0602 12/26/22 0448 12/27/22 0451  NA 135 137 139 141 142  K 5.1 5.0 4.5 3.4* 3.1*  CL 98 98 96* 92* 86*  CO2 30 28 34* 39* >45*  GLUCOSE 138* 112* 128* 98 110*  BUN 39* 41* 41* 36* 32*  CREATININE 2.45* 1.91* 1.46* 1.24 1.15  CALCIUM 8.5* 8.7* 8.8* 9.1 9.0  MG  --   --   --   --  1.4*  PHOS  --   --  3.8  --   --     Liver Function Tests: Recent Labs  Lab 12/25/22 0602  ALBUMIN 3.4*   No results for input(s): "LIPASE", "AMYLASE" in the last 168 hours. Recent Labs  Lab 12/25/22 1338  AMMONIA 37*    CBC: Recent Labs  Lab 12/23/22 0243 12/24/22 0443 12/25/22 0602 12/26/22 0448 12/27/22 0451  WBC 7.7 8.1 9.1 10.3 9.3  HGB 14.3 14.1 13.5 13.2 14.0  HCT 47.8 46.3 45.7 41.3 43.1  MCV 103.5* 105.0* 103.9* 97.6 94.9   PLT 239 210 194 188 197    Cardiac Enzymes: No results for input(s): "CKTOTAL", "CKMB", "CKMBINDEX", "TROPONINI" in the last 168 hours.  BNP: Invalid input(s): "POCBNP"  CBG: No results for input(s): "GLUCAP" in the last 168 hours.  Microbiology: Results for orders placed or performed during the hospital encounter of 01/01/21  Resp Panel by RT-PCR (Flu A&B, Covid) Nasopharyngeal Swab     Status: None   Collection Time: 01/01/21  4:57 PM   Specimen: Nasopharyngeal Swab; Nasopharyngeal(NP) swabs in vial transport medium  Result Value Ref Range Status   SARS Coronavirus 2 by RT PCR NEGATIVE NEGATIVE Final    Comment: (NOTE) SARS-CoV-2 target nucleic acids are NOT DETECTED.  The SARS-CoV-2 RNA is generally detectable in upper respiratory specimens during the acute phase of infection. The lowest concentration of SARS-CoV-2 viral copies this assay can detect is 138 copies/mL. A negative result does not preclude SARS-Cov-2 infection and should not be used as the sole basis for treatment or other patient management decisions. A negative result may occur with  improper specimen collection/handling, submission of specimen other than nasopharyngeal swab, presence of viral mutation(s) within the areas targeted by this assay, and inadequate number of viral copies(<138  copies/mL). A negative result must be combined with clinical observations, patient history, and epidemiological information. The expected result is Negative.  Fact Sheet for Patients:  BloggerCourse.com  Fact Sheet for Healthcare Providers:  SeriousBroker.it  This test is no t yet approved or cleared by the [place] FDA and  has been authorized for detection and/or diagnosis of SARS-CoV-2 by FDA under an Emergency Use Authorization (EUA). This EUA will remain  in effect (meaning this test can be used) for the duration of the COVID-19 declaration under Section  564(b)(1) of the Act, 21 U.S.C.section 360bbb-3(b)(1), unless the authorization is terminated  or revoked sooner.       Influenza A by PCR NEGATIVE NEGATIVE Final   Influenza B by PCR NEGATIVE NEGATIVE Final    Comment: (NOTE) The Xpert Xpress SARS-CoV-2/FLU/RSV plus assay is intended as an aid in the diagnosis of influenza from Nasopharyngeal swab specimens and should not be used as a sole basis for treatment. Nasal washings and aspirates are unacceptable for Xpert Xpress SARS-CoV-2/FLU/RSV testing.  Fact Sheet for Patients: BloggerCourse.com  Fact Sheet for Healthcare Providers: SeriousBroker.it  This test is not yet approved or cleared by the [place] FDA and has been authorized for detection and/or diagnosis of SARS-CoV-2 by FDA under an Emergency Use Authorization (EUA). This EUA will remain in effect (meaning this test can be used) for the duration of the COVID-19 declaration under Section 564(b)(1) of the Act, 21 U.S.C. section 360bbb-3(b)(1), unless the authorization is terminated or revoked.  Performed at [facility], [address] [place], [place] [address]     Coagulation Studies: No results for input(s): "LABPROT", "INR" in the last 72 hours.  Urinalysis: No results for input(s): "COLORURINE", "LABSPEC", "PHURINE", "GLUCOSEU", "HGBUR", "BILIRUBINUR", "KETONESUR", "PROTEINUR", "UROBILINOGEN", "NITRITE", "LEUKOCYTESUR" in the last 72 hours.  Invalid input(s): "APPERANCEUR"     Imaging: CT Angio Chest Pulmonary Embolism (PE) W or WO Contrast  Result Date: 12/26/2022 CLINICAL DATA:  Pulmonary embolism suspected, high probability. EXAM: CT ANGIOGRAPHY CHEST WITH CONTRAST TECHNIQUE: Multidetector CT imaging of the chest was performed using the standard protocol during bolus administration of intravenous contrast. Multiplanar CT image reconstructions and MIPs were obtained to evaluate the vascular  anatomy. RADIATION DOSE REDUCTION: This exam was performed according to the departmental dose-optimization program which includes automated exposure control, adjustment of the mA and/or kV according to patient size and/or use of iterative reconstruction technique. CONTRAST:  75mL OMNIPAQUE IOHEXOL 350 MG/ML SOLN COMPARISON:  V/Q scan 12/23/2022. CT angio of the abdomen and pelvis 12/14/2015 FINDINGS: Cardiovascular: The heart is enlarged. Atherosclerotic calcifications are present at the aortic arch and great vessel origins. No aneurysm or focal stenosis is present. Atherosclerotic calcifications are present in the coronary arteries. Pulmonary artery opacification is excellent. No focal filling defects are present to suggest pulmonary embolus. Mediastinum/Nodes: No enlarged mediastinal, hilar, or axillary lymph nodes. Thyroid gland, trachea, and esophagus demonstrate no significant findings. Lungs/Pleura: A moderate-sized right pleural effusion is present. Associated right lower lobe and posterior right upper lobe atelectasis is present. Minimal atelectasis is present the left base. The lungs are otherwise clear. No pneumothorax is present. Upper Abdomen: Layering densities are present at the neck of the gallbladder without other inflammatory change. An exophytic water density lesion posteriorly from the left kidney has increased somewhat in size, now measuring 3.3 cm. Musculoskeletal: No chest wall abnormality. No acute or significant osseous findings. Review of the MIP images confirms the above findings. IMPRESSION: 1. No pulmonary embolus. 2. Moderate-sized right  pleural effusion with associated right lower lobe and posterior right upper lobe atelectasis. 3. Cardiomegaly without failure. 4. Coronary artery disease. 5. Cholelithiasis without evidence of cholecystitis. 6. Simple cyst of the left kidney as described. Recommend no imaging follow-up. 7.  Aortic Atherosclerosis (ICD10-I70.0). Electronically Signed    By: Marin Roberts M.D.   On: 12/26/2022 10:07     Medications:    furosemide (LASIX) 200 mg in dextrose 5 % 100 mL (2 mg/mL) infusion 8 mg/hr (12/27/22 0525)    acetaZOLAMIDE  500 mg Oral BID   apixaban  5 mg Oral BID   aspirin EC  81 mg Oral Daily   atorvastatin  40 mg Oral QHS   dextromethorphan-guaiFENesin  1 tablet Oral BID   hydrALAZINE  37.5 mg Oral TID   isosorbide mononitrate  30 mg Oral Daily   magnesium oxide  400 mg Oral BID   metoprolol tartrate  12.5 mg Oral BID   potassium chloride  40 mEq Oral BID   acetaminophen **OR** acetaminophen, metoprolol tartrate, ondansetron **OR** ondansetron (ZOFRAN) IV, senna-docusate  Assessment/ Plan:  Frederick Perry is a 67 y.o.  male with a PMHx of chronic diastolic heart failure, morbid obesity, atrial fibrillation, history of IVC placement, history of recurrent DVT, hyperlipidemia, hypertension, who was admitted to [facility] on 12/22/2022 for Elevated d-dimer [R79.89] Acute hypoxemic respiratory failure (HCC) [J96.01] Acute on chronic congestive heart failure, unspecified heart failure type (HCC) [I50.9]   Acute kidney injury/chronic kidney disease stage II baseline creatinine 1.18 with EGFR 69. Suspect that the patient's acute kidney injury is related to cardiorenal syndrome. Renal ultrasound negative for hydronephrosis.    Creatinine at baseline.   Lab Results  Component Value Date   CREATININE 1.15 12/27/2022   CREATININE 1.24 12/26/2022   CREATININE 1.46 (H) 12/25/2022    Intake/Output Summary (Last 24 hours) at 12/27/2022 1237 Last data filed at 12/27/2022 1156 Gross per 24 hour  Intake 120 ml  Output 5400 ml  Net -5280 ml    2.  Acute exacerbation on chronic diastolic heart failure.   - fursoemide gtt 8mg /hr  3.   Metabolic alkalosis: secondary to contraction from fursoemide.  - acetazolamide  - consider weaning furosemide gtt  4. Acute respiratory failure: requiring supplemental oxygen.  - Consider  thoracentesis for pleural effusion   LOS: 5 Frederick Perry 7/13/202412:37 PM

## 2022-12-27 NOTE — Progress Notes (Signed)
Patient Name: Frederick Perry Date of Encounter: 12/27/2022 [facility] Cardiologist: Debbe Odea, MD   Interval Summary  .    Feeling well.  Breathing is about the same.  Thinks that his edema has improved.  Vital Signs .    Vitals:   12/27/22 0600 12/27/22 0700 12/27/22 0809 12/27/22 1150  BP:   111/81 113/74  Pulse:   (!) 58 (!) 53  Resp: (!) 24 (!) 21 18 18   Temp:   98 F (36.7 C) 98.6 F (37 C)  TempSrc:      SpO2:   98% 96%  Weight:      Height:        Intake/Output Summary (Last 24 hours) at 12/27/2022 1339 Last data filed at 12/27/2022 1156 Gross per 24 hour  Intake 120 ml  Output 5400 ml  Net -5280 ml      12/22/2022    7:00 PM 01/22/2022    2:53 PM 02/06/2021   10:41 AM  Last 3 Weights  Weight (lbs) 280 lb 3.3 oz 250 lb 4 oz 245 lb  Weight (kg) 127.1 kg 113.513 kg 111.131 kg      Telemetry/ECG    MAT.  NSVT.  SVT.- Personally Reviewed  Physical Exam .    VS:  BP 113/74 (BP Location: Right Arm)   Pulse (!) 53   Temp 98.6 F (37 C)   Resp 18   Ht 6' 1.5" (1.867 m)   Wt 127.1 kg   SpO2 96%   BMI 36.46 kg/m  , BMI Body mass index is 36.46 kg/m. GENERAL:  Well appearing.  Frequent cough. HEENT: Pupils equal round and reactive, fundi not visualized, oral mucosa unremarkable NECK:  No jugular venous distention, waveform within normal limits, carotid upstroke brisk and symmetric, no bruits, no thyromegaly LUNGS: Diminished at the bases bilaterally. HEART:  RRR.  Occasional ectopy.  PMI not displaced or sustained,S1 and S2 within normal limits, no S3, no S4, no clicks, no rubs, no murmurs ABD:  Flat, positive bowel sounds normal in frequency in pitch, no bruits, no rebound, no guarding, no midline pulsatile mass, no hepatomegaly, no splenomegaly EXT:  2 plus pulses throughout, 1+ LE edema to above ankles bilaterally, no cyanosis no clubbing SKIN:  No rashes no nodules NEURO:  Cranial nerves II through XII grossly intact, motor grossly  intact throughout PSYCH:  Cognitively intact, oriented to person place and time   Assessment & Plan .     # Acute on chronic diastolic heart failure: LVEF 50-55%.  BNP was 2200 on admission.  He presented with anasarca.  He was net -5.8 L yesterday.  -14.1 L this admission.  Now with contraction alkalosis.  Creatinines improving but BUN is climbing.  He is receiving acetazolamide per nephrology.  We will switch Lasix to 20 mg IV twice daily.  Lower extremity edema is significantly improved.  He does have a large right pleural effusion.  Agree with thoracentesis if that can be arranged.  # Hypertension: Blood pressure stable on hydralazine, Imdur, and metoprolol.  # Prior DVT/PE: IVC filtera nd Eliquis.  CT-A negative for PE.  # SVT: # NSVT: Currently maintaining sinus rhythm.  Has had a lot of ectopy likely in the setting of hypokalemia and aggressive diuresis. Continue to monitor as HR is often in the 50s, so we will not titrate metoprolol at this time.  Maintain K>4, MG >2.  # Hyperlipidemia: Continue atorvastatin.  For questions or updates, please contact  [facility] Please consult [email or web address] for contact info under        Signed, Chilton Si, MD

## 2022-12-27 NOTE — Progress Notes (Addendum)
Progress Note   Patient: Frederick Perry RUE:[id number] DOB: Feb 23, 1956 DOA: 12/22/2022     5 DOS: the patient was seen and examined on 12/27/2022    Brief hospital course: From HPI: "[name] is a 67 year old male with heart failure reduced ejection fraction, morbid obesity, atrial fibrillation status post IVC filter placement in 2017 with recurrent DVT this patient is on Eliquis, hyperlipidemia, hypertension, who presents to the emergency department for chief concerns of swelling of bilateral lower extremities. Vitals in the ED showed temperature of 98, respiration rate of 22, heart rate of 100, improved to 83, blood pressure 134/95, SpO2 of 88% on room air.  BNP was elevated at 2212.1.  High sensitive troponin was elevated 201--270--273.  D-dimer was elevated at 6.14.  VQ scan requested pending"   Further hospital course and management as outlined below.    Assessment and Plan:  Acute respiratory failure with hypoxia and hypercapnia Etiology workup in progress, differentials include heart failure exacerbation however given chest x-ray were unremarkable, and markedly elevated D-dimer, PE cannot be excluded at this time Unable to obtain CTA of the chest on admission as renal function does not allow VQ scan performed inconclusive Lower extremity Doppler negative for DVTs CTA chest 7/12 negative for PE 7/11 - VBG obtained due to lethargy - pCO2 88 - bipap ordered by pt refused it 7/12 - mentation improved, pCO2 improved without bipap --Supplement O2 to maintain spO2 > 90%, wean as tolerated  Acute on Chronic HFrEF (heart failure with reduced ejection fraction) (HCC) Appears to be in acute decompensation on admission Echocardiogram 12/22/22 - EF 50-55%, grade II DD, + LV WMA's, mild MR, late systolic MV prolapse BNP significantly elevated 2212 Undergoing diuresis with Lasix drip Net IO Since Admission: -14,146.99 mL [12/27/22 1339]  --Cardiology and Nephrology consulted -  appreciate mgmt --Continue Lasix drip, currently at 8 mg/hr  --Given metolazone also on 7/11 --Monitor daily weights, strict I/O's --Monitor renal function, electrolytes --Continue hydralazine, Imdur --Holding Coreg   Sinus tachycardia - HR 120's to as high as 140 overnight and this AM.   ?New Onset A-fib with RVR PE ruled out.  No clear evidence of infection to suggest sepsis. Has moderate R pleural effusion and compressive atelectasis, likely contributing. ?if getting intravascularly dry with diuresis at this point, on exam still seems volume overloaded --Cardiology following --Coreg held due to acute CHF --PRN IV metoprolol --Cardiology started metoprolol tartrate 12.5 mg BID --Telemetry  Hypomagnesemia - Mg 1.4 today (7/13) --IV and PO Mg replacement ordered --Monitor Mg level & replace PRN for goal > 2.0  Acute metabolic encephalopathy - due to hypercapnic respiratory failure --BiPAP for ventilation --Monitor blood gas --Delirium precautions   Elevated troponin due to demand ischemia in setting of decompensated CHF No chest pain. Cardiology following Telemetry monitoring VQ scan inconclusive for PE CTA negative for PE  Acute kidney injury - improving Due to cardiorenal syndrome in setting of CHF decompensation Patient presented with creatinine 2.47 however last creatinine was 1.11-year ago Cr 2.45 >> 1.91 >> 1.46 >> 1.24 today --Nephrology  consulted --Renally dose meds, avoid nephrotoxins --Monitor BMP  History of pulmonary embolism (HCC) Patient endorses that his IVC filter from 2017 has never been removed Patient endorses compliance with home Eliquis, VQ scan was inconclusive CTA chest 7/12 negative for PE Doppler of lower extremity did not show any evidence of DVT --Transition from heparin drip >> [name]  Coronary artery disease Continue atorvastatin, aspirin resumed on admission   Essential  hypertension Hydralazine 37.5 mg PO TID Hydralazine 5 mg IV  PRN Coreg held due to acute CHF   Hyperlipidemia Continue atorvastatin   Obesity Body mass index is 36.46 kg/m. Complicates overall care and prognosis.   Recommend lifestyle modifications including physical activity and diet for weight loss and overall long-term health.     Subjective . Interval History Pt was sleeping soundly when seen this AM. He woke very briefly, reported being tired, denied any complaints.  No acute events reported.       DVT prophylaxis: heparin per pharmacy   Code Status: full code  Diet: heart healthy    Physical Exam:  General exam: sleeping comfortably, no acute distress HEENT: moist mucus membranes,  Respiratory system: CTAB, diminished right base, no wheezes or rhonchi, normal respiratory effort Cardiovascular system:  irregularly irregular, 2+ BLE edema Gastrointestinal system: soft, NT, ND Central nervous system: limited due to somnolence Extremities: 2+ BLE edema, normal tone Skin: dry, intact, normal temperature Psychiatry: limited due to somnolence    Data Reviewed: Notable labs & studies ---  K 3.1, Cl 86, bicarb >45 (contraction alkalosis), BUN 32, Cr improved 1.15, Mg 1.4.    VBG 7/11 - pH 7.31 / pCO2 86/ pO2 51 VBG 7/12 - pH 7.49 / pCO2 64 / pO2 72  VQ scan 7/10 -- inconclusive for PE  CTA chest 7/12 -- negative for PE.  Moderate R pleural effusion with compressive atelectasis   Family Communication: none   Disposition Plan: pending clinical course.   SNF recommended   Time spent: 42 minutes    Author: Pennie Banter, DO 12/27/2022 1:39 PM  For on call review [email or web address].

## 2022-12-27 NOTE — TOC Progression Note (Signed)
Transition of Care [facility]) - Progression Note    Patient Details  Name: Frederick Perry MRN: [id number] Date of Birth: 03/10/1956  [facility]) CM/SW Contact  Bing Quarry, RN Phone Number: 12/27/2022, 3:16 PM  Clinical Narrative: 7/13: Attempted to speak to patient regarding if he had been able to discuss SNF vs going home with son and [facility] for a discharge plan. Left VM on cell phone with call back number. No contact listed for son. Will follow up.  Gabriel Cirri MSN RN CM  Transitions of Care Department [facility] [phone] Weekends Only       Expected Discharge Plan:  (TBD) Barriers to Discharge: Continued Medical Work up  Expected Discharge Plan and Services     Post Acute Care Choice:  (TBD) Living arrangements for the past 2 months:  (Truck)                                       Social Determinants of Health (SDOH) Interventions SDOH Screenings   Food Insecurity: No Food Insecurity (12/23/2022)  Housing: Low Risk  (12/23/2022)  Transportation Needs: No Transportation Needs (12/23/2022)  Utilities: Not At Risk (12/23/2022)  Financial Resource Strain: Low Risk  (12/22/2022)   Received from [facility], [facility]  Physical Activity: Unknown (02/04/2018)  Social Connections: Unknown (02/04/2018)  Tobacco Use: High Risk (12/23/2022)    Readmission Risk Interventions     No data to display

## 2022-12-28 DIAGNOSIS — I10 Essential (primary) hypertension: Secondary | ICD-10-CM | POA: Diagnosis not present

## 2022-12-28 DIAGNOSIS — I25118 Atherosclerotic heart disease of native coronary artery with other forms of angina pectoris: Secondary | ICD-10-CM | POA: Diagnosis not present

## 2022-12-28 DIAGNOSIS — I5033 Acute on chronic diastolic (congestive) heart failure: Secondary | ICD-10-CM | POA: Diagnosis not present

## 2022-12-28 DIAGNOSIS — J9601 Acute respiratory failure with hypoxia: Secondary | ICD-10-CM | POA: Diagnosis not present

## 2022-12-28 LAB — BASIC METABOLIC PANEL
Anion gap: 12 (ref 5–15)
BUN: 35 mg/dL — ABNORMAL HIGH (ref 8–23)
CO2: 43 mmol/L — ABNORMAL HIGH (ref 22–32)
Calcium: 8.8 mg/dL — ABNORMAL LOW (ref 8.9–10.3)
Chloride: 83 mmol/L — ABNORMAL LOW (ref 98–111)
Creatinine, Ser: 1.47 mg/dL — ABNORMAL HIGH (ref 0.61–1.24)
GFR, Estimated: 53 mL/min — ABNORMAL LOW (ref >60)
Glucose, Bld: 103 mg/dL — ABNORMAL HIGH (ref 70–99)
Potassium: 2.9 mmol/L — ABNORMAL LOW (ref 3.5–5.1)
Sodium: 138 mmol/L (ref 135–145)

## 2022-12-28 LAB — MAGNESIUM: Magnesium: 1.7 mg/dL (ref 1.7–2.4)

## 2022-12-28 MED ORDER — POTASSIUM CHLORIDE CRYS ER 20 MEQ PO TBCR
40.0000 meq | EXTENDED_RELEASE_TABLET | ORAL | Status: AC
  Administered 2022-12-28 (×3): 40 meq via ORAL
  Filled 2022-12-28 (×3): qty 2

## 2022-12-28 MED ORDER — MAGNESIUM SULFATE 2 GM/50ML IV SOLN
2.0000 g | Freq: Once | INTRAVENOUS | Status: AC
  Administered 2022-12-28: 2 g via INTRAVENOUS
  Filled 2022-12-28: qty 50

## 2022-12-28 MED ORDER — METOPROLOL TARTRATE 25 MG PO TABS
25.0000 mg | ORAL_TABLET | Freq: Two times a day (BID) | ORAL | Status: DC
  Administered 2022-12-28 – 2022-12-30 (×5): 25 mg via ORAL
  Filled 2022-12-28 (×6): qty 1

## 2022-12-28 MED ORDER — POTASSIUM CHLORIDE 10 MEQ/100ML IV SOLN
10.0000 meq | INTRAVENOUS | Status: AC
  Administered 2022-12-28 (×5): 10 meq via INTRAVENOUS
  Filled 2022-12-28 (×5): qty 100

## 2022-12-28 NOTE — TOC Progression Note (Signed)
Transition of Care [facility]) - Progression Note    Patient Details  Name: Frederick Perry MRN: [id number] Date of Birth: 10-27-1955  Transition of Care [facility]) CM/SW Contact  Bing Quarry, RN Phone Number: 12/28/2022, 10:10 AM  Clinical Narrative: 7/14: Attempted to speak to patient again regarding discussion with son about STR/or going home with son for [facility]. Patient told RN CM this was not a good time to talk and when RN CM asked when would be a better time today, patient stated he did not know. TOC to follow up.  Gabriel Cirri MSN RN CM  Transitions of Care Department [facility] [phone] Weekends Only     Expected Discharge Plan:  (TBD) Barriers to Discharge: Continued Medical Work up  Expected Discharge Plan and Services     Post Acute Care Choice:  (TBD) Living arrangements for the past 2 months:  (Truck)                                       Social Determinants of Health (SDOH) Interventions SDOH Screenings   Food Insecurity: No Food Insecurity (12/23/2022)  Housing: Low Risk  (12/23/2022)  Transportation Needs: No Transportation Needs (12/23/2022)  Utilities: Not At Risk (12/23/2022)  Financial Resource Strain: Low Risk  (12/22/2022)   Received from [facility], [facility]  Physical Activity: Unknown (02/04/2018)  Social Connections: Unknown (02/04/2018)  Tobacco Use: High Risk (12/23/2022)    Readmission Risk Interventions     No data to display

## 2022-12-28 NOTE — Progress Notes (Signed)
Rounding Note    Patient Name: Frederick Perry Date of Encounter: 12/28/2022  [facility] Cardiologist: Debbe Odea, MD   Subjective   Patient seen on a.m. rounds.  Sitting in bed watching movies on his phone.  Denies any chest pain.  States breathing is relatively the same.  But edema to his bilateral lower extremities has improved.  -3.3 L output overnight  Inpatient Medications    Scheduled Meds:  acetaZOLAMIDE  500 mg Oral BID   apixaban  5 mg Oral BID   aspirin EC  81 mg Oral Daily   atorvastatin  40 mg Oral QHS   dextromethorphan-guaiFENesin  1 tablet Oral BID   furosemide  60 mg Intravenous BID   hydrALAZINE  37.5 mg Oral TID   isosorbide mononitrate  30 mg Oral Daily   metoprolol tartrate  25 mg Oral BID   potassium chloride  40 mEq Oral Q4H   Continuous Infusions:  magnesium sulfate bolus IVPB 2 g (12/28/22 1003)   potassium chloride     PRN Meds: metoprolol tartrate, senna-docusate   Vital Signs    Vitals:   12/27/22 2257 12/27/22 2350 12/28/22 0353 12/28/22 0906  BP: 123/76 117/68 119/66 106/73  Pulse: 77 96 81 61  Resp: 19 16 20  (!) 24  Temp: 98.7 F (37.1 C) 98.6 F (37 C) 98.4 F (36.9 C) 98.5 F (36.9 C)  TempSrc:      SpO2: 98% 96% 99% 98%  Weight:      Height:        Intake/Output Summary (Last 24 hours) at 12/28/2022 1008 Last data filed at 12/28/2022 0908 Gross per 24 hour  Intake 315.29 ml  Output 3650 ml  Net -3334.71 ml      12/22/2022    7:00 PM 01/22/2022    2:53 PM 02/06/2021   10:41 AM  Last 3 Weights  Weight (lbs) 280 lb 3.3 oz 250 lb 4 oz 245 lb  Weight (kg) 127.1 kg 113.513 kg 111.131 kg      Telemetry    Sinus rhythm with ventricular bigeminy, NSVT, but rate better controlled- Personally Reviewed  ECG    No new tracings completed - Personally Reviewed  Physical Exam   GEN: No acute distress.  Sitting upright in bed watching his phone Neck: No JVD Cardiac: RRR, no murmurs, rubs, or gallops.   Occasional ectopy. Respiratory: Clear upper lobes with diminished bases bilaterally to auscultation bilaterally.  Respirations are unlabored at rest on 3.5 L of O2 via nasal cannula GI: Soft, nontender, non-distended  MS: 1+ edema; No deformity.  Compression stockings on bilaterally Neuro:  Nonfocal  Psych: Normal affect   Labs    High Sensitivity Troponin:   Recent Labs  Lab 12/22/22 1620 12/22/22 1811 12/23/22 0129  TROPONINIHS 201* 270* 273*     Chemistry Recent Labs  Lab 12/25/22 0602 12/26/22 0448 12/27/22 0451 12/28/22 0434  NA 139 141 142 138  K 4.5 3.4* 3.1* 2.9*  CL 96* 92* 86* 83*  CO2 34* 39* >45* 43*  GLUCOSE 128* 98 110* 103*  BUN 41* 36* 32* 35*  CREATININE 1.46* 1.24 1.15 1.47*  CALCIUM 8.8* 9.1 9.0 8.8*  MG  --   --  1.4* 1.7  ALBUMIN 3.4*  --   --   --   GFRNONAA 53* >60 >60 53*  ANIONGAP 9 10 NOT CALCULATED 12    Lipids No results for input(s): "CHOL", "TRIG", "HDL", "LABVLDL", "LDLCALC", "CHOLHDL" in the last  168 hours.  Hematology Recent Labs  Lab 12/25/22 0602 12/26/22 0448 12/27/22 0451  WBC 9.1 10.3 9.3  RBC 4.40 4.23 4.54  HGB 13.5 13.2 14.0  HCT 45.7 41.3 43.1  MCV 103.9* 97.6 94.9  MCH 30.7 31.2 30.8  MCHC 29.5* 32.0 32.5  RDW 16.9* 16.7* 17.0*  PLT 194 188 197   Thyroid  Recent Labs  Lab 12/25/22 1338  TSH 1.358    BNP Recent Labs  Lab 12/22/22 1620  BNP 2,212.1*    DDimer  Recent Labs  Lab 12/22/22 1659  DDIMER 6.14*     Radiology    No results found.  Cardiac Studies   TTE 12/22/22  1. Left ventricular ejection fraction, by estimation, is 50 to 55%. The  left ventricle has low normal function. The left ventricle demonstrates  regional wall motion abnormalities (see scoring diagram/findings for  description). Left ventricular diastolic   parameters are consistent with Grade II diastolic dysfunction  (pseudonormalization). Elevated left atrial pressure.   2. Right ventricular systolic function is mildly  reduced. The right  ventricular size is mildly enlarged. There is mildly elevated pulmonary  artery systolic pressure.   3. Left atrial size was mildly dilated.   4. Right atrial size was mildly dilated.   5. The mitral valve is abnormal. Mild mitral valve regurgitation. No  evidence of mitral stenosis. There is mild late systolic prolapse of  posterior leaflet of the mitral valve. Cannot exclude a ruptured chord.   6. The aortic valve is tricuspid. There is moderate calcification of the  aortic valve. There is moderate thickening of the aortic valve. Aortic  valve regurgitation is not visualized. Aortic valve  sclerosis/calcification is present, without any evidence  of aortic stenosis.   7. The inferior vena cava is dilated in size with <50% respiratory  variability, suggesting right atrial pressure of 15 mmHg.   Patient Profile     67 y.o. male with a past medical history of CAD, cardiomyopathy, hypertension, type 2 diabetes, smoker, alcohol abuse, CKD, DVT/PE with prior IVC filter in 2017 on apixaban for recurrent DVT in 2021, has been seen and evaluated for worsening leg swelling, hypoxia, and weight gain.  Assessment & Plan    Acute on chronic systolic and diastolic congestive heart failure -LVEF 50-55%, dilated IVC -30 pound weight gain on arrival -BNP 2200 - -4 L output in the last 24 hours, -16.9 L output since admission -Now with contraction alkalosis -Continued on Diamox and furosemide IVP per nephrology -Large right pleural effusion with consideration for thoracentesis if that can be arranged -Continued with daily weights, I's and O's, low-sodium diet  Acute kidney injury on CKD -Serum creatinine 1.47 -Furosemide drip discontinued on 12/27/2022 -Receiving furosemide 60 mg IV twice daily -Continues to be followed by nephrology -Monitor urine output -Daily BMP -Avoid nephrotoxic agents were able  Hypokalemia -Serum potassium 2.9 -Potassium supplementations ordered  by primary team -Daily BMP -Monitor/trend/replete electrolytes as needed -Recommend keeping potassium level greater than 4 less than 5  Ventricular bigeminy/NSVT/questionable MAT/SVT -Ectopy improving -Metoprolol increased from 12.5 mg twice daily to 25 mg twice daily -Continue with telemetry monitoring  CAD with elevated high-sensitivity troponin -High-sensitivity troponin slightly elevated likely supply/demand mismatch in the setting of CHF -Prior ischemic workup in July 2022 with heart catheterization recommending medical management -Continues to remain chest pain-free -EKG noted T wave changes in inferior and lateral leads -Continued on aspirin, statin, and Imdur -Consider outpatient ischemic workup  Essential hypertension -Blood pressure 106/73 -  Continued on current medication regimen -Vital signs per unit protocol  History of DVT/PE -IVC filter placed in 2017 -On apixaban 5 mg twice daily -Ultrasound of legs negative for DVT -Elevated D-dimer at 6 with a VQ scan that was indeterminant      For questions or updates, please contact [facility] Please consult [email or web address] for contact info under        Signed, Shoaib Siefker, NP  12/28/2022, 10:08 AM

## 2022-12-28 NOTE — Progress Notes (Addendum)
Progress Note   Patient: Frederick Perry:[id number] DOB: [birth year] DOA: 12/22/2022     6 DOS: the patient was seen and examined on 12/28/2022    Brief hospital course: From HPI: "Mr. Fay Stefko is a 67 year old male with heart failure reduced ejection fraction, morbid obesity, atrial fibrillation status post IVC filter placement in 2017 with recurrent DVT this patient is on Eliquis, hyperlipidemia, hypertension, who presents to the emergency department for chief concerns of swelling of bilateral lower extremities. Vitals in the ED showed temperature of 98, respiration rate of 22, heart rate of 100, improved to 83, blood pressure 134/95, SpO2 of 88% on room air.  BNP was elevated at 2212.1.  High sensitive troponin was elevated 201--270--273.  D-dimer was elevated at 6.14.  VQ scan requested pending"   Further hospital course and management as outlined below.    Assessment and Plan:  Acute respiratory failure with hypoxia and hypercapnia Etiology workup in progress, differentials include heart failure exacerbation however given chest x-ray were unremarkable, and markedly elevated D-dimer, PE cannot be excluded at this time Unable to obtain CTA of the chest on admission as renal function does not allow VQ scan performed inconclusive Lower extremity Doppler negative for DVTs CTA chest 7/12 negative for PE 7/11 - VBG obtained due to lethargy - pCO2 88 - bipap ordered by pt refused it 7/12 - mentation improved, pCO2 improved without bipap --Supplement O2 to maintain spO2 > 90%, wean as tolerated  Acute on Chronic HFrEF (heart failure with reduced ejection fraction) (HCC) Appears to be in acute decompensation on admission Echocardiogram 12/22/22 - EF 50-55%, grade II DD, + LV WMA's, mild MR, late systolic MV prolapse BNP significantly elevated 2212 Undergoing diuresis with Lasix drip Net IO Since Admission: -16,981.7 mL [12/28/22 1428]  --Cardiology and Nephrology consulted -  appreciate mgmt --Transitioned from Lasix drip >>  IV Lasix 60 mg BID on 7/13 >> Lasix held today due to Cr rise --Given metolazone also on 7/11 --Acetazolamide added per Nephrology --Monitor daily weights, strict I/O's --Monitor renal function, electrolytes --Continue hydralazine, Imdur --Holding Coreg  --Started on metoprolol tartrate  Right pleural effusion - contributing to his hypoxia --IR for thoracentesis (likely tomorrow) --Follow up pleural fluid studies post-procedure  Sinus tachycardia - HR 120's to as high as 140 overnight and this AM.   ?New Onset A-fib with RVR PE ruled out.  No clear evidence of infection to suggest sepsis. Has moderate R pleural effusion and compressive atelectasis, likely contributing. ?if getting intravascularly dry with diuresis at this point, on exam still seems volume overloaded --Cardiology following - appreciate management --Home Coreg was held due to acute CHF --Started Metoprolol tartrate 12.5 >>  25 mg PO BID  --Started on Eliquis 5 mg PO BID --PRN IV metoprolol if HR sustains > 110 bpm --Telemetry  Hypokalemia -- ongoing replacement with diuresis K 2.9 this AM --Further PO and IV replacement underway --Monitor BMP  Hypomagnesemia - Mg 1.4 today (7/13) --IV and PO Mg replacement ordered --Monitor Mg level & replace PRN for goal > 2.0  Acute metabolic encephalopathy - due to hypercapnic respiratory failure --BiPAP for ventilation --Monitor blood gas --Delirium precautions   Elevated troponin due to demand ischemia in setting of decompensated CHF No chest pain. Cardiology following Telemetry monitoring VQ scan inconclusive for PE CTA negative for PE  Acute kidney injury - improving Due to cardiorenal syndrome in setting of CHF decompensation Patient presented with creatinine 2.47 however last creatinine was 1.11-year ago Cr  2.45 >> 1.91 >> 1.46 >> 1.24 today --Nephrology  consulted --Renally dose meds, avoid  nephrotoxins --Monitor BMP  History of pulmonary embolism (HCC) Patient endorses that his IVC filter from 2017 has never been removed Patient endorses compliance with home Eliquis, VQ scan was inconclusive CTA chest 7/12 negative for PE Doppler of lower extremity did not show any evidence of DVT --Transition from heparin drip >> [name]  Coronary artery disease Continue atorvastatin, aspirin resumed on admission   Essential hypertension Hydralazine 37.5 mg PO TID Hydralazine 5 mg IV PRN Coreg held due to acute CHF   Hyperlipidemia Continue atorvastatin   Obesity Body mass index is 36.46 kg/m. Complicates overall care and prognosis.   Recommend lifestyle modifications including physical activity and diet for weight loss and overall long-term health.     Subjective . Interval History Pt was awake sitting up in bed when seen.  Reports feeling okay, wants to go home.  Denies feeling short of breath, but says he was when he first came in.  No other acute complaints.  Discussed thoracentesis and it's purpose and pt is agreeable to undergo the procedure.     DVT prophylaxis: heparin per pharmacy   Code Status: full code  Diet: heart healthy    Physical Exam:  General exam: awake, alert, no acute distress HEENT: moist mucus membranes, hearing grossly normal Respiratory system: CTAB, diminished right base, no wheezes or rhonchi, normal respiratory effort Cardiovascular system:  RRR, 2+ BLE edema Gastrointestinal system: soft, NT, ND Central nervous system: A&O.3, normal speech, no gross focal deficits Extremities: improved BLE edema, TED hose on BLE's Skin: dry, intact, normal temperature Psychiatry: limited due to somnolence    Data Reviewed: Notable labs & studies ---  K 2.9, Cl 83, bicarb 43 (contraction alkalosis), BUN 35, Cr 1.15 >> 1.47   VQ scan 7/10 -- inconclusive for PE  CTA chest 7/12 -- negative for PE.  Moderate R pleural effusion with compressive  atelectasis   Family Communication: none   Disposition Plan: SNF recommended   Time spent: 38 minutes    Author: Pennie Banter, DO 12/28/2022 2:28 PM  For on call review [email or web address].

## 2022-12-28 NOTE — Progress Notes (Signed)
[facility]  ROUNDING NOTE   Subjective:   Transitioned off furosemide gtt. Furosemide IV 60mg  given this morning and yesterday.   UOP .   Patient has no complaints.   Objective:  Vital signs in last 24 hours:  Temp:  [97.8 F (36.6 C)-99.3 F (37.4 C)] 98.5 F (36.9 C) (07/14 0906) Pulse Rate:  [53-96] 61 (07/14 0906) Resp:  [16-24] 24 (07/14 0906) BP: (106-123)/(66-83) 106/73 (07/14 0906) SpO2:  [96 %-99 %] 98 % (07/14 0906) FiO2 (%):  [28 %] 28 % (07/13 2231)  Weight change:  Filed Weights   12/22/22 1900  Weight: 127.1 kg    Intake/Output: I/O last 3 completed shifts: In: 195.3 [I.V.:195.3] Out: 7750 [Urine:7750]   Intake/Output this shift:  Total I/O In: 120 [P.O.:120] Out: -   Physical Exam: General: NAD, laying in bed  Head: Normocephalic, atraumatic. Moist oral mucosal membranes  Eyes: Anicteric  Lungs:  Clear to auscultation, [place] 3.5 L O2  Heart: Regular rate and rhythm  Abdomen:  Soft, nontender, obese  Extremities: + peripheral edema.  Neurologic: Nonfocal, moving all four extremities  Skin: No lesions  Access: None    Basic Metabolic Panel: Recent Labs  Lab 12/24/22 0443 12/25/22 0602 12/26/22 0448 12/27/22 0451 12/28/22 0434  NA 137 139 141 142 138  K 5.0 4.5 3.4* 3.1* 2.9*  CL 98 96* 92* 86* 83*  CO2 28 34* 39* >45* 43*  GLUCOSE 112* 128* 98 110* 103*  BUN 41* 41* 36* 32* 35*  CREATININE 1.91* 1.46* 1.24 1.15 1.47*  CALCIUM 8.7* 8.8* 9.1 9.0 8.8*  MG  --   --   --  1.4* 1.7  PHOS  --  3.8  --   --   --     Liver Function Tests: Recent Labs  Lab 12/25/22 0602  ALBUMIN 3.4*   No results for input(s): "LIPASE", "AMYLASE" in the last 168 hours. Recent Labs  Lab 12/25/22 1338  AMMONIA 37*    CBC: Recent Labs  Lab 12/23/22 0243 12/24/22 0443 12/25/22 0602 12/26/22 0448 12/27/22 0451  WBC 7.7 8.1 9.1 10.3 9.3  HGB 14.3 14.1 13.5 13.2 14.0  HCT 47.8 46.3 45.7 41.3 43.1  MCV 103.5* 105.0* 103.9*  97.6 94.9  PLT 239 210 194 188 197    Cardiac Enzymes: No results for input(s): "CKTOTAL", "CKMB", "CKMBINDEX", "TROPONINI" in the last 168 hours.  BNP: Invalid input(s): "POCBNP"  CBG: No results for input(s): "GLUCAP" in the last 168 hours.  Microbiology: Results for orders placed or performed during the hospital encounter of 01/01/21  Resp Panel by RT-PCR (Flu A&B, Covid) Nasopharyngeal Swab     Status: None   Collection Time: 01/01/21  4:57 PM   Specimen: Nasopharyngeal Swab; Nasopharyngeal(NP) swabs in vial transport medium  Result Value Ref Range Status   SARS Coronavirus 2 by RT PCR NEGATIVE NEGATIVE Final    Comment: (NOTE) SARS-CoV-2 target nucleic acids are NOT DETECTED.  The SARS-CoV-2 RNA is generally detectable in upper respiratory specimens during the acute phase of infection. The lowest concentration of SARS-CoV-2 viral copies this assay can detect is 138 copies/mL. A negative result does not preclude SARS-Cov-2 infection and should not be used as the sole basis for treatment or other patient management decisions. A negative result may occur with  improper specimen collection/handling, submission of specimen other than nasopharyngeal swab, presence of viral mutation(s) within the areas targeted by this assay, and inadequate number of viral copies(<138 copies/mL). A negative result must  be combined with clinical observations, patient history, and epidemiological information. The expected result is Negative.  Fact Sheet for Patients:  BloggerCourse.com  Fact Sheet for Healthcare Providers:  SeriousBroker.it  This test is no t yet approved or cleared by the [place] FDA and  has been authorized for detection and/or diagnosis of SARS-CoV-2 by FDA under an Emergency Use Authorization (EUA). This EUA will remain  in effect (meaning this test can be used) for the duration of the COVID-19 declaration under  Section 564(b)(1) of the Act, 21 U.S.C.section 360bbb-3(b)(1), unless the authorization is terminated  or revoked sooner.       Influenza A by PCR NEGATIVE NEGATIVE Final   Influenza B by PCR NEGATIVE NEGATIVE Final    Comment: (NOTE) The Xpert Xpress SARS-CoV-2/FLU/RSV plus assay is intended as an aid in the diagnosis of influenza from Nasopharyngeal swab specimens and should not be used as a sole basis for treatment. Nasal washings and aspirates are unacceptable for Xpert Xpress SARS-CoV-2/FLU/RSV testing.  Fact Sheet for Patients: BloggerCourse.com  Fact Sheet for Healthcare Providers: SeriousBroker.it  This test is not yet approved or cleared by the [place] FDA and has been authorized for detection and/or diagnosis of SARS-CoV-2 by FDA under an Emergency Use Authorization (EUA). This EUA will remain in effect (meaning this test can be used) for the duration of the COVID-19 declaration under Section 564(b)(1) of the Act, 21 U.S.C. section 360bbb-3(b)(1), unless the authorization is terminated or revoked.  Performed at [facility], [address] [place], [place] [address]     Coagulation Studies: No results for input(s): "LABPROT", "INR" in the last 72 hours.  Urinalysis: No results for input(s): "COLORURINE", "LABSPEC", "PHURINE", "GLUCOSEU", "HGBUR", "BILIRUBINUR", "KETONESUR", "PROTEINUR", "UROBILINOGEN", "NITRITE", "LEUKOCYTESUR" in the last 72 hours.  Invalid input(s): "APPERANCEUR"     Imaging: No results found.   Medications:    magnesium sulfate bolus IVPB 2 g (12/28/22 1003)   potassium chloride 10 mEq (12/28/22 1010)    acetaZOLAMIDE  500 mg Oral BID   apixaban  5 mg Oral BID   aspirin EC  81 mg Oral Daily   atorvastatin  40 mg Oral QHS   dextromethorphan-guaiFENesin  1 tablet Oral BID   hydrALAZINE  37.5 mg Oral TID   isosorbide mononitrate  30 mg Oral Daily   metoprolol  tartrate  25 mg Oral BID   potassium chloride  40 mEq Oral Q4H   metoprolol tartrate, senna-docusate  Assessment/ Plan:  [name] is a 67 y.o.  male with a PMHx of chronic diastolic heart failure, morbid obesity, atrial fibrillation, history of IVC placement, history of recurrent DVT, hyperlipidemia, hypertension, who was admitted to [facility] on 12/22/2022 for Elevated d-dimer [R79.89] Acute hypoxemic respiratory failure (HCC) [J96.01] Acute on chronic congestive heart failure, unspecified heart failure type (HCC) [I50.9]   Acute kidney injury/chronic kidney disease stage II baseline creatinine 1.18 with EGFR 69. Suspect that the patient's acute kidney injury is related to cardiorenal syndrome. Renal ultrasound negative for hydronephrosis.      Lab Results  Component Value Date   CREATININE 1.47 (H) 12/28/2022   CREATININE 1.15 12/27/2022   CREATININE 1.24 12/26/2022    Intake/Output Summary (Last 24 hours) at 12/28/2022 1103 Last data filed at 12/28/2022 0908 Gross per 24 hour  Intake 315.29 ml  Output 3650 ml  Net -3334.71 ml    2.  Acute exacerbation on chronic diastolic heart failure.   - furosemide - acetazolamide.  3.   Metabolic alkalosis: secondary to contraction from fursoemide.  - acetazolamide   4. Acute respiratory failure: requiring supplemental oxygen.  - Consider thoracentesis for pleural effusion   LOS: 6 Radhika Dershem 7/14/202411:03 AM

## 2022-12-29 ENCOUNTER — Inpatient Hospital Stay: Payer: Medicare Other

## 2022-12-29 DIAGNOSIS — I5043 Acute on chronic combined systolic (congestive) and diastolic (congestive) heart failure: Secondary | ICD-10-CM | POA: Diagnosis not present

## 2022-12-29 DIAGNOSIS — J9601 Acute respiratory failure with hypoxia: Secondary | ICD-10-CM | POA: Diagnosis not present

## 2022-12-29 LAB — BASIC METABOLIC PANEL
Anion gap: 9 (ref 5–15)
BUN: 37 mg/dL — ABNORMAL HIGH (ref 8–23)
CO2: 40 mmol/L — ABNORMAL HIGH (ref 22–32)
Calcium: 8.9 mg/dL (ref 8.9–10.3)
Chloride: 88 mmol/L — ABNORMAL LOW (ref 98–111)
Creatinine, Ser: 1.53 mg/dL — ABNORMAL HIGH (ref 0.61–1.24)
GFR, Estimated: 50 mL/min — ABNORMAL LOW (ref >60)
Glucose, Bld: 155 mg/dL — ABNORMAL HIGH (ref 70–99)
Potassium: 3.6 mmol/L (ref 3.5–5.1)
Sodium: 137 mmol/L (ref 135–145)

## 2022-12-29 LAB — MAGNESIUM: Magnesium: 2.2 mg/dL (ref 1.7–2.4)

## 2022-12-29 NOTE — TOC Progression Note (Signed)
Transition of Care [facility]) - Progression Note    Patient Details  Name: Frederick Perry MRN: [id number] Date of Birth: 07-Feb-1956  Transition of Care [facility]) CM/SW Contact  Margarito Liner, LCSW Phone Number: 12/29/2022, 4:20 PM  Clinical Narrative:  PT has changed recommendation to home health. 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 Patient declined RW. He confirmed he was not on oxygen prior to admission.   Expected Discharge Plan:  (TBD) Barriers to Discharge: Continued Medical Work up  Expected Discharge Plan and Services     Post Acute Care Choice:  (TBD) Living arrangements for the past 2 months:  (Truck)                                       Social Determinants of Health (SDOH) Interventions SDOH Screenings   Food Insecurity: No Food Insecurity (12/23/2022)  Housing: Low Risk  (12/23/2022)  Transportation Needs: No Transportation Needs (12/23/2022)  Utilities: Not At Risk (12/23/2022)  Financial Resource Strain: Low Risk  (12/22/2022)   Received from [facility], [facility]  Physical Activity: Unknown (02/04/2018)  Social Connections: Unknown (02/04/2018)  Tobacco Use: High Risk (12/23/2022)    Readmission Risk Interventions     No data to display

## 2022-12-29 NOTE — Progress Notes (Signed)
[facility]  ROUNDING NOTE   Subjective:   Patient seen laying in bed Alert and oriented Denies pain or discomfort States his breathing feels better. Less shortness of breath with activity  Objective:  Vital signs in last 24 hours:  Temp:  [98 F (36.7 C)-98.6 F (37 C)] 98.5 F (36.9 C) (07/15 1144) Pulse Rate:  [67-116] 116 (07/15 1144) Resp:  [18-20] 19 (07/15 1200) BP: (104-120)/(67-76) 109/73 (07/15 1144) SpO2:  [96 %-100 %] 99 % (07/15 1144)  Weight change:  Filed Weights   12/22/22 1900  Weight: 127.1 kg    Intake/Output: I/O last 3 completed shifts: In: 520 [P.O.:120; IV Piggyback:400] Out: 4600 [Urine:4600]   Intake/Output this shift:  No intake/output data recorded.  Physical Exam: General: NAD, laying in bed  Head: Normocephalic, atraumatic.   Eyes: Anicteric  Lungs:  Clear to auscultation, [place] 3 L O2  Heart: Regular rate and rhythm  Abdomen:  Soft, nontender, obese  Extremities: + peripheral edema.  Neurologic: Nonfocal, moving all four extremities  Skin: No lesions  Access: None    Basic Metabolic Panel: Recent Labs  Lab 12/25/22 0602 12/26/22 0448 12/27/22 0451 12/28/22 0434 12/29/22 0424  NA 139 141 142 138 137  K 4.5 3.4* 3.1* 2.9* 3.6  CL 96* 92* 86* 83* 88*  CO2 34* 39* >45* 43* 40*  GLUCOSE 128* 98 110* 103* 155*  BUN 41* 36* 32* 35* 37*  CREATININE 1.46* 1.24 1.15 1.47* 1.53*  CALCIUM 8.8* 9.1 9.0 8.8* 8.9  MG  --   --  1.4* 1.7 2.2  PHOS 3.8  --   --   --   --     Liver Function Tests: Recent Labs  Lab 12/25/22 0602  ALBUMIN 3.4*   No results for input(s): "LIPASE", "AMYLASE" in the last 168 hours. Recent Labs  Lab 12/25/22 1338  AMMONIA 37*    CBC: Recent Labs  Lab 12/23/22 0243 12/24/22 0443 12/25/22 0602 12/26/22 0448 12/27/22 0451  WBC 7.7 8.1 9.1 10.3 9.3  HGB 14.3 14.1 13.5 13.2 14.0  HCT 47.8 46.3 45.7 41.3 43.1  MCV 103.5* 105.0* 103.9* 97.6 94.9  PLT 239 210 194 188 197     Cardiac Enzymes: No results for input(s): "CKTOTAL", "CKMB", "CKMBINDEX", "TROPONINI" in the last 168 hours.  BNP: Invalid input(s): "POCBNP"  CBG: No results for input(s): "GLUCAP" in the last 168 hours.  Microbiology: Results for orders placed or performed during the hospital encounter of 01/01/21  Resp Panel by RT-PCR (Flu A&B, Covid) Nasopharyngeal Swab     Status: None   Collection Time: 01/01/21  4:57 PM   Specimen: Nasopharyngeal Swab; Nasopharyngeal(NP) swabs in vial transport medium  Result Value Ref Range Status   SARS Coronavirus 2 by RT PCR NEGATIVE NEGATIVE Final    Comment: (NOTE) SARS-CoV-2 target nucleic acids are NOT DETECTED.  The SARS-CoV-2 RNA is generally detectable in upper respiratory specimens during the acute phase of infection. The lowest concentration of SARS-CoV-2 viral copies this assay can detect is 138 copies/mL. A negative result does not preclude SARS-Cov-2 infection and should not be used as the sole basis for treatment or other patient management decisions. A negative result may occur with  improper specimen collection/handling, submission of specimen other than nasopharyngeal swab, presence of viral mutation(s) within the areas targeted by this assay, and inadequate number of viral copies(<138 copies/mL). A negative result must be combined with clinical observations, patient history, and epidemiological information. The expected result is Negative.  Fact Sheet for Patients:  BloggerCourse.com  Fact Sheet for Healthcare Providers:  SeriousBroker.it  This test is no t yet approved or cleared by the [place] FDA and  has been authorized for detection and/or diagnosis of SARS-CoV-2 by FDA under an Emergency Use Authorization (EUA). This EUA will remain  in effect (meaning this test can be used) for the duration of the COVID-19 declaration under Section 564(b)(1) of the Act,  21 U.S.C.section 360bbb-3(b)(1), unless the authorization is terminated  or revoked sooner.       Influenza A by PCR NEGATIVE NEGATIVE Final   Influenza B by PCR NEGATIVE NEGATIVE Final    Comment: (NOTE) The Xpert Xpress SARS-CoV-2/FLU/RSV plus assay is intended as an aid in the diagnosis of influenza from Nasopharyngeal swab specimens and should not be used as a sole basis for treatment. Nasal washings and aspirates are unacceptable for Xpert Xpress SARS-CoV-2/FLU/RSV testing.  Fact Sheet for Patients: BloggerCourse.com  Fact Sheet for Healthcare Providers: SeriousBroker.it  This test is not yet approved or cleared by the [place] FDA and has been authorized for detection and/or diagnosis of SARS-CoV-2 by FDA under an Emergency Use Authorization (EUA). This EUA will remain in effect (meaning this test can be used) for the duration of the COVID-19 declaration under Section 564(b)(1) of the Act, 21 U.S.C. section 360bbb-3(b)(1), unless the authorization is terminated or revoked.  Performed at [facility], [address] [place], [place] [address]     Coagulation Studies: No results for input(s): "LABPROT", "INR" in the last 72 hours.  Urinalysis: No results for input(s): "COLORURINE", "LABSPEC", "PHURINE", "GLUCOSEU", "HGBUR", "BILIRUBINUR", "KETONESUR", "PROTEINUR", "UROBILINOGEN", "NITRITE", "LEUKOCYTESUR" in the last 72 hours.  Invalid input(s): "APPERANCEUR"     Imaging: No results found.   Medications:      acetaZOLAMIDE  500 mg Oral BID   apixaban  5 mg Oral BID   aspirin EC  81 mg Oral Daily   atorvastatin  40 mg Oral QHS   dextromethorphan-guaiFENesin  1 tablet Oral BID   hydrALAZINE  37.5 mg Oral TID   isosorbide mononitrate  30 mg Oral Daily   metoprolol tartrate  25 mg Oral BID   metoprolol tartrate, senna-docusate  Assessment/ Plan:  Mr. CARREL LEATHER is a 67 y.o.  male  with a PMHx of chronic diastolic heart failure, morbid obesity, atrial fibrillation, history of IVC placement, history of recurrent DVT, hyperlipidemia, hypertension, who was admitted to [facility] on 12/22/2022 for Elevated d-dimer [R79.89] Acute hypoxemic respiratory failure (HCC) [J96.01] Acute on chronic congestive heart failure, unspecified heart failure type (HCC) [I50.9]   Acute kidney injury/chronic kidney disease stage II baseline creatinine 1.18 with EGFR 69. Suspect that the patient's acute kidney injury is related to cardiorenal syndrome. Renal ultrasound negative for hydronephrosis.  - Creatinine slightly elevated - UOP 2.55L    - Diuretics remain held  Lab Results  Component Value Date   CREATININE 1.53 (H) 12/29/2022   CREATININE 1.47 (H) 12/28/2022   CREATININE 1.15 12/27/2022    Intake/Output Summary (Last 24 hours) at 12/29/2022 1239 Last data filed at 12/29/2022 0700 Gross per 24 hour  Intake 400 ml  Output 2550 ml  Net -2150 ml    2.  Acute exacerbation on chronic diastolic heart failure.   - furosemide held - acetazolamide.    3.   Metabolic alkalosis: secondary to contraction from fursoemide.  - acetazolamide   4. Acute respiratory failure: requiring supplemental oxygen.  - Consider thoracentesis for pleural effusion  LOS: 7 Chelsea Nusz 7/15/202412:39 PM

## 2022-12-29 NOTE — Procedures (Signed)
Patient was brought to [place] suite for ordered right thoracentesis. Upon examination with ultrasound, only a small pleural effusion with no safe window for thoracentesis was visualized. Thoracentesis was not attempted today. Please see [place] Chest under imaging tab for further details. Kennieth Francois, PA-C 12/29/2022

## 2022-12-29 NOTE — Care Management Important Message (Signed)
Important Message  Patient Details  Name: Frederick Perry MRN: [id number] Date of Birth: 1956-05-25   Medicare Important Message Given:  Yes     Johnell Comings 12/29/2022, 10:35 AM

## 2022-12-29 NOTE — Progress Notes (Signed)
Physical Therapy Treatment Patient Details Name: Frederick Perry MRN: [id number] DOB: 02-Aug-1955 Today's Date: 12/29/2022   History of Present Illness Patient is a 67 year old male with heart failure reduced ejection fraction, morbid obesity, atrial fibrillation status post IVC filter placement in 2017 with recurrent DVT this patient is on Eliquis, hyperlipidemia, hypertension, who presents to the emergency department for chief concerns of swelling of bilateral lower extremities.    PT Comments  Pt supine in bed and agreeable to session. Patient not wearing Max and O2 stats in low 80s, placed patient back on O2 and stats returned to 90s. Supine<>sit mod I for increased time and use of bed rails. STS bed elevated w/ RW min guard. Ambulated 274ft with RW no rest breaks on 3L of O2. O2 maintained in 90s during ambulation, normal HR response ( 119 bpm). Patient left in recliner with call bell and alarm set. Assisted Pt in setting up breakfast.      Assistance Recommended at Discharge Intermittent Supervision/Assistance  If plan is discharge home, recommend the following:  Can travel by private vehicle    A little help with walking and/or transfers;A little help with bathing/dressing/bathroom;Assistance with cooking/housework;Assist for transportation;Help with stairs or ramp for entrance   Yes  Equipment Recommendations  Rolling walker (2 wheels)    Recommendations for Other Services       Precautions / Restrictions Precautions Precautions: Fall Precaution Comments: watch HR Restrictions Weight Bearing Restrictions: No     Mobility  Bed Mobility Overal bed mobility: Needs Assistance Bed Mobility: Supine to Sit     Supine to sit: Modified independent (Device/Increase time), HOB elevated     General bed mobility comments: Increased tim e needed for movement. Appropriate sequencing.    Transfers Overall transfer level: Needs assistance Equipment used: Rolling walker (2  wheels) Transfers: Sit to/from Stand Sit to Stand: Min guard           General transfer comment: education on hand placement and dangers of pulling on RW.    Ambulation/Gait Ambulation/Gait assistance: Min guard Gait Distance (Feet): 200 Feet Assistive device: Rolling walker (2 wheels) Gait Pattern/deviations: Step-through pattern, Wide base of support (Toeing out)       General Gait Details: demonstrates slight toeing out when ambulating. Ambulated on 3L of O2 due to stats being in the low 80s at rest w/out O2. maintained 95% while ambulating.   Stairs             Wheelchair Mobility     Tilt Bed    Modified Rankin (Stroke Patients Only)       Balance Overall balance assessment: Needs assistance Sitting-balance support: Feet supported, No upper extremity supported Sitting balance-Leahy Scale: Good     Standing balance support: Bilateral upper extremity supported, During functional activity, Reliant on assistive device for balance Standing balance-Leahy Scale: Fair                              Cognition Arousal/Alertness: Awake/alert Behavior During Therapy: WFL for tasks assessed/performed Overall Cognitive Status: Within Functional Limits for tasks assessed                                 General Comments: follows simple commands throughout session        Exercises      General Comments        Pertinent Vitals/Pain Pain  Assessment Pain Assessment: No/denies pain    Home Living                          Prior Function            PT Goals (current goals can now be found in the care plan section) Acute Rehab PT Goals Patient Stated Goal: to return to prior level of independence PT Goal Formulation: With patient Time For Goal Achievement: 01/08/23 Potential to Achieve Goals: Good Progress towards PT goals: Progressing toward goals    Frequency    Min 1X/week      PT Plan Current plan  remains appropriate    Co-evaluation              AM-PAC PT "6 Clicks" Mobility   Outcome Measure  Help needed turning from your back to your side while in a flat bed without using bedrails?: A Little Help needed moving from lying on your back to sitting on the side of a flat bed without using bedrails?: A Little Help needed moving to and from a bed to a chair (including a wheelchair)?: A Little Help needed standing up from a chair using your arms (e.g., wheelchair or bedside chair)?: A Little Help needed to walk in hospital room?: A Little Help needed climbing 3-5 steps with a railing? : A Little 6 Click Score: 18    End of Session Equipment Utilized During Treatment: Oxygen Activity Tolerance: Patient tolerated treatment well Patient left: in chair;with chair alarm set;with call bell/phone within reach Nurse Communication: Mobility status PT Visit Diagnosis: Muscle weakness (generalized) (M62.81);Unsteadiness on feet (R26.81);Difficulty in walking, not elsewhere classified (R26.2)     Time: 1610-9604 PT Time Calculation (min) (ACUTE ONLY): 27 min  Charges:                            Malachi Carl, SPT    Malachi Carl 12/29/2022, 12:48 PM

## 2022-12-29 NOTE — Progress Notes (Signed)
Progress Note   Patient: Frederick Perry [id number]:[id number] DOB: 04-19-1956 DOA: 12/22/2022     7 DOS: the patient was seen and examined on 12/29/2022    Brief hospital course: From HPI: "Mr. Daxtyn Rottenberg is a 67 year old male with heart failure reduced ejection fraction, morbid obesity, atrial fibrillation status post IVC filter placement in 2017 with recurrent DVT this patient is on Eliquis, hyperlipidemia, hypertension, who presents to the emergency department for chief concerns of swelling of bilateral lower extremities. Vitals in the ED showed temperature of 98, respiration rate of 22, heart rate of 100, improved to 83, blood pressure 134/95, SpO2 of 88% on room air.  BNP was elevated at 2212.1.  High sensitive troponin was elevated 201--270--273.  D-dimer was elevated at 6.14.  VQ scan requested pending"   Further hospital course and management as outlined below.    Assessment and Plan:  Acute respiratory failure with hypoxia and hypercapnia Etiology workup in progress, differentials include heart failure exacerbation however given chest x-ray were unremarkable, and markedly elevated D-dimer, PE cannot be excluded at this time Unable to obtain CTA of the chest on admission as renal function does not allow VQ scan performed inconclusive Lower extremity Doppler negative for DVTs CTA chest 7/12 negative for PE 7/11 - VBG obtained due to lethargy - pCO2 88 - bipap ordered by pt refused it 7/12 - mentation improved, pCO2 improved without bipap --Supplement O2 to maintain spO2 > 90%, wean as tolerated  Acute on Chronic HFrEF (heart failure with reduced ejection fraction) (HCC) Appears to be in acute decompensation on admission Echocardiogram 12/22/22 - EF 50-55%, grade II DD, + LV WMA's, mild MR, late systolic MV prolapse BNP significantly elevated 2212 Undergoing diuresis with Lasix drip Net IO Since Admission: -19,131.7 mL [12/29/22 1419]  --Cardiology and Nephrology consulted -  appreciate mgmt --Transitioned from Lasix drip >>  IV Lasix 60 mg BID on 7/13 >>  --Lasix now on hold due to Cr rising --Given metolazone also on 7/11 --Acetazolamide 500 mg PO BID per Nephrology --Monitor daily weights, strict I/O's --Monitor renal function, electrolytes --Continue hydralazine, Imdur --Holding Coreg  --Started on metoprolol tartrate --Low sodium diet  Right pleural effusion - contributing to his hypoxia Thoracentesis attempted today (7/15) but no large enough fluid pocket was seen to do safely. Procedure was not performed. Diuresis per cardiology  Sinus tachycardia - HR 120's to as high as 140 overnight and this AM.   ?New Onset A-fib with RVR PE ruled out.  No clear evidence of infection to suggest sepsis. Has moderate R pleural effusion and compressive atelectasis, likely contributing. ?if getting intravascularly dry with diuresis at this point, on exam still seems volume overloaded --Cardiology following - appreciate management --Home Coreg was held due to acute CHF --Started Metoprolol tartrate 12.5 >>  25 mg PO BID  --Started on Eliquis 5 mg PO BID --PRN IV metoprolol if HR sustains > 110 bpm --Telemetry  Hypokalemia -- ongoing replacement with diuresis K 2.9 this AM --Further PO and IV replacement underway --Monitor BMP  Hypomagnesemia - Mg 1.4 today (7/13) --IV and PO Mg replacement ordered --Monitor Mg level & replace PRN for goal > 2.0  Acute metabolic encephalopathy - due to hypercapnic respiratory failure --BiPAP for ventilation --Monitor blood gas --Delirium precautions   Elevated troponin due to demand ischemia in setting of decompensated CHF No chest pain. Cardiology following Telemetry monitoring VQ scan inconclusive for PE CTA negative for PE  Acute kidney injury - improving Due to  cardiorenal syndrome in setting of CHF decompensation Patient presented with creatinine 2.47 however last creatinine was 1.11-year ago Cr 2.45 >> 1.91  >> 1.46 >> 1.24 today --Nephrology  consulted --Renally dose meds, avoid nephrotoxins --Monitor BMP  History of pulmonary embolism (HCC) Patient endorses that his IVC filter from 2017 has never been removed Patient endorses compliance with home Eliquis, VQ scan was inconclusive CTA chest 7/12 negative for PE Doppler of lower extremity did not show any evidence of DVT --Transition from heparin drip >> [name]  Coronary artery disease Continue atorvastatin, aspirin resumed on admission   Essential hypertension Hydralazine 37.5 mg PO TID Hydralazine 5 mg IV PRN Coreg held due to acute CHF   Hyperlipidemia Continue atorvastatin   Obesity Body mass index is 36.46 kg/m. Complicates overall care and prognosis.   Recommend lifestyle modifications including physical activity and diet for weight loss and overall long-term health.     Subjective . Interval History Pt was up in recliner this AM, seen before going to [place].  He agrees to thoracentesis today, we discussed the procedure in detail yesterday, he denied having questions about it today.  He is watching videos on his phone, and does not look up from phone during encounter.  Does not engage in conversation.  Does say wants to go home.     DVT prophylaxis: heparin per pharmacy   Code Status: full code  Diet: heart healthy    Physical Exam:  General exam: awake, alert, no acute distress HEENT: moist mucus membranes, hearing grossly normal Respiratory system: CTAB, no wheezes or rhonchi, normal respiratory effort Cardiovascular system:  RRR, improved BLE edema Gastrointestinal system: soft, NT, ND Central nervous system: A&O.3, normal speech, no gross focal deficits Extremities: improved BLE edema (trace to 1+), TED hose on BLE's Skin: dry, intact, normal temperature Psychiatry: normal mood, flat affect, minimally conversant    Data Reviewed: Notable labs & studies ---  Cl 83 >> 88, bicarb 43 >> 40 (contraction  alkalosis), BUN 37, Cr 1.15 >> 1.47 >> 1.53   VQ scan 7/10 -- inconclusive for PE  CTA chest 7/12 -- negative for PE.  Moderate R pleural effusion with compressive atelectasis   Family Communication: none   Disposition Plan: SNF recommended   Time spent: 42 minutes    Author: Pennie Banter, DO 12/29/2022 2:19 PM  For on call review [email or web address].

## 2022-12-29 NOTE — Plan of Care (Signed)
  Problem: Education: Goal: Knowledge of General Education information will improve Description: Including pain rating scale, medication(s)/side effects and non-pharmacologic comfort measures Outcome: Progressing   Problem: Activity: Goal: Risk for activity intolerance will decrease Outcome: Progressing   Problem: Coping: Goal: Level of anxiety will decrease Outcome: Progressing   Problem: Elimination: Goal: Will not experience complications related to bowel motility Outcome: Progressing Goal: Will not experience complications related to urinary retention Outcome: Progressing   

## 2022-12-29 NOTE — Progress Notes (Signed)
Rounding Note    Patient Name: Frederick Perry Date of Encounter: 12/29/2022  [facility] Cardiologist: Debbe Odea, MD   Subjective   He reports improvement in symptoms overall with no chest pain or shortness of breath.  Diuretics are still on hold due to worsening renal function.  Inpatient Medications    Scheduled Meds:  acetaZOLAMIDE  500 mg Oral BID   apixaban  5 mg Oral BID   aspirin EC  81 mg Oral Daily   atorvastatin  40 mg Oral QHS   dextromethorphan-guaiFENesin  1 tablet Oral BID   hydrALAZINE  37.5 mg Oral TID   isosorbide mononitrate  30 mg Oral Daily   metoprolol tartrate  25 mg Oral BID   Continuous Infusions:   PRN Meds: metoprolol tartrate, senna-docusate   Vital Signs    Vitals:   12/29/22 0436 12/29/22 0836 12/29/22 1144 12/29/22 1200  BP: 120/74 104/67 109/73   Pulse: (!) 102 92 (!) 116   Resp: 19 18 20 19   Temp: 98.6 F (37 C) 98.6 F (37 C) 98.5 F (36.9 C)   TempSrc:      SpO2: 97% 96% 99%   Weight:      Height:        Intake/Output Summary (Last 24 hours) at 12/29/2022 1446 Last data filed at 12/29/2022 0700 Gross per 24 hour  Intake 400 ml  Output 2550 ml  Net -2150 ml      12/22/2022    7:00 PM 01/22/2022    2:53 PM 02/06/2021   10:41 AM  Last 3 Weights  Weight (lbs) 280 lb 3.3 oz 250 lb 4 oz 245 lb  Weight (kg) 127.1 kg 113.513 kg 111.131 kg      Telemetry    Sinus rhythm with ventricular bigeminy, NSVT, but rate better controlled- Personally Reviewed  ECG    No new tracings completed - Personally Reviewed  Physical Exam   GEN: No acute distress.  Sitting upright in bed watching his phone Neck: No JVD Cardiac: RRR, no murmurs, rubs, or gallops.  Occasional ectopy. Respiratory: Clear upper lobes with diminished bases bilaterally to auscultation bilaterally.  Respirations are unlabored at rest on 3.5 L of O2 via nasal cannula GI: Soft, nontender, non-distended  MS: 1+ edema; No deformity.   Compression stockings on bilaterally Neuro:  Nonfocal  Psych: Normal affect   Labs    High Sensitivity Troponin:   Recent Labs  Lab 12/22/22 1620 12/22/22 1811 12/23/22 0129  TROPONINIHS 201* 270* 273*     Chemistry Recent Labs  Lab 12/25/22 0602 12/26/22 0448 12/27/22 0451 12/28/22 0434 12/29/22 0424  NA 139   < > 142 138 137  K 4.5   < > 3.1* 2.9* 3.6  CL 96*   < > 86* 83* 88*  CO2 34*   < > >45* 43* 40*  GLUCOSE 128*   < > 110* 103* 155*  BUN 41*   < > 32* 35* 37*  CREATININE 1.46*   < > 1.15 1.47* 1.53*  CALCIUM 8.8*   < > 9.0 8.8* 8.9  MG  --   --  1.4* 1.7 2.2  ALBUMIN 3.4*  --   --   --   --   GFRNONAA 53*   < > >60 53* 50*  ANIONGAP 9   < > NOT CALCULATED 12 9   < > = values in this interval not displayed.    Lipids No results for input(s): "CHOL", "TRIG", "  HDL", "LABVLDL", "LDLCALC", "CHOLHDL" in the last 168 hours.  Hematology Recent Labs  Lab 12/25/22 0602 12/26/22 0448 12/27/22 0451  WBC 9.1 10.3 9.3  RBC 4.40 4.23 4.54  HGB 13.5 13.2 14.0  HCT 45.7 41.3 43.1  MCV 103.9* 97.6 94.9  MCH 30.7 31.2 30.8  MCHC 29.5* 32.0 32.5  RDW 16.9* 16.7* 17.0*  PLT 194 188 197   Thyroid  Recent Labs  Lab 12/25/22 1338  TSH 1.358    BNP Recent Labs  Lab 12/22/22 1620  BNP 2,212.1*    DDimer  Recent Labs  Lab 12/22/22 1659  DDIMER 6.14*     Radiology    [place] CHEST (PLEURAL EFFUSION)  Result Date: 12/29/2022 INDICATION: Right pleural effusion EXAM: CHEST ULTRASOUND COMPARISON:  CTA chest 12/26/2022 FINDINGS: Small right-sided pleural effusion visualizes. No significant pocket of fluid or percutaneous window to allow safe thoracentesis especially given the motion of the lung visualized to the chest wall. Risks of thoracentesis today outweigh the benefits. IMPRESSION: Small right-sided pleural effusion with no safe window for percutaneous access. Thoracentesis was not performed today. Read by: Mina Marble, PA-C Electronically Signed   By: Olive Bass M.D.   On: 12/29/2022 12:40    Cardiac Studies   TTE 12/22/22  1. Left ventricular ejection fraction, by estimation, is 50 to 55%. The  left ventricle has low normal function. The left ventricle demonstrates  regional wall motion abnormalities (see scoring diagram/findings for  description). Left ventricular diastolic   parameters are consistent with Grade II diastolic dysfunction  (pseudonormalization). Elevated left atrial pressure.   2. Right ventricular systolic function is mildly reduced. The right  ventricular size is mildly enlarged. There is mildly elevated pulmonary  artery systolic pressure.   3. Left atrial size was mildly dilated.   4. Right atrial size was mildly dilated.   5. The mitral valve is abnormal. Mild mitral valve regurgitation. No  evidence of mitral stenosis. There is mild late systolic prolapse of  posterior leaflet of the mitral valve. Cannot exclude a ruptured chord.   6. The aortic valve is tricuspid. There is moderate calcification of the  aortic valve. There is moderate thickening of the aortic valve. Aortic  valve regurgitation is not visualized. Aortic valve  sclerosis/calcification is present, without any evidence  of aortic stenosis.   7. The inferior vena cava is dilated in size with <50% respiratory  variability, suggesting right atrial pressure of 15 mmHg.   Patient Profile     67 y.o. male with a past medical history of CAD, cardiomyopathy, hypertension, type 2 diabetes, smoker, alcohol abuse, CKD, DVT/PE with prior IVC filter in 2017 on apixaban for recurrent DVT in 2021, has been seen and evaluated for worsening leg swelling, hypoxia, and weight gain.  Assessment & Plan    Acute on chronic systolic and diastolic congestive heart failure -LVEF 50-55%, dilated IVC -30 pound weight gain on arrival -BNP 2200 - - he is -19 L for the admission and appears to be mildly volume depleted with contraction alkalosis.  I agree with holding  diuretics. It appears that he was on torsemide 20 mg once daily as an outpatient and this can be resumed once his renal function starts improving.  Acute kidney injury on CKD -Creatinine today is 1.53.  Continue to hold diuretics.  Essential hypertension: Blood pressure is controlled.  Ventricular bigeminy/NSVT/questionable MAT/SVT -Improved with increasing metoprolol to 25 mg twice daily.  CAD with elevated high-sensitivity troponin -High-sensitivity troponin slightly elevated likely  supply/demand mismatch in the setting of CHF -Prior ischemic workup in July 2022 with cardiac catheterization revealed chronically occluded mid LAD and chronically occluded ostial ramus, and OM2 and right PDA with moderate to severe stenosis in the mid left circumflex.  No good targets for PCI or CABG at that time. Consider outpatient PET scan to evaluate degree of ischemia.  History of DVT/PE -IVC filter placed in 2017 -On apixaban 5 mg twice daily -Ultrasound of legs negative for DVT      For questions or updates, please contact [facility] Please consult [email or web address] for contact info under        Signed, Lorine Bears, MD  12/29/2022, 2:46 PM

## 2022-12-30 DIAGNOSIS — J9601 Acute respiratory failure with hypoxia: Secondary | ICD-10-CM | POA: Diagnosis not present

## 2022-12-30 LAB — BASIC METABOLIC PANEL
Anion gap: 10 (ref 5–15)
BUN: 39 mg/dL — ABNORMAL HIGH (ref 8–23)
CO2: 37 mmol/L — ABNORMAL HIGH (ref 22–32)
Calcium: 8.7 mg/dL — ABNORMAL LOW (ref 8.9–10.3)
Chloride: 90 mmol/L — ABNORMAL LOW (ref 98–111)
Creatinine, Ser: 1.3 mg/dL — ABNORMAL HIGH (ref 0.61–1.24)
GFR, Estimated: >60 mL/min (ref >60)
Glucose, Bld: 120 mg/dL — ABNORMAL HIGH (ref 70–99)
Potassium: 3.1 mmol/L — ABNORMAL LOW (ref 3.5–5.1)
Sodium: 137 mmol/L (ref 135–145)

## 2022-12-30 LAB — URINALYSIS, COMPLETE (UACMP) WITH MICROSCOPIC
Bacteria, UA: NONE SEEN
Squamous Epithelial / HPF: NONE SEEN /HPF (ref 0–5)

## 2022-12-30 MED ORDER — METOPROLOL TARTRATE 25 MG PO TABS
25.0000 mg | ORAL_TABLET | Freq: Once | ORAL | Status: AC
  Administered 2022-12-30: 25 mg via ORAL
  Filled 2022-12-30: qty 1

## 2022-12-30 MED ORDER — METOPROLOL SUCCINATE ER 50 MG PO TB24
50.0000 mg | ORAL_TABLET | Freq: Every day | ORAL | Status: DC
  Administered 2022-12-31 – 2023-01-02 (×3): 50 mg via ORAL
  Filled 2022-12-30 (×3): qty 1

## 2022-12-30 MED ORDER — POTASSIUM CHLORIDE CRYS ER 20 MEQ PO TBCR
40.0000 meq | EXTENDED_RELEASE_TABLET | ORAL | Status: AC
  Administered 2022-12-30 (×2): 40 meq via ORAL
  Filled 2022-12-30 (×2): qty 2

## 2022-12-30 NOTE — Progress Notes (Signed)
Mobility Specialist - Progress Note   12/30/22 1100  Mobility  Activity Refused mobility     2nd attempt this AM. Pt declined mobility despite encouragement; no reason specified. Will attempt another date/time.    Filiberto Pinks Mobility Specialist 12/30/22, 11:23 AM

## 2022-12-30 NOTE — Progress Notes (Addendum)
Progress Note   Patient: Frederick Perry [id number]:[id number] DOB: 04-10-1956 DOA: 12/22/2022     8 DOS: the patient was seen and examined on 12/30/2022    Brief hospital course: From HPI: "[name] is a 67 year old male with heart failure reduced ejection fraction, morbid obesity, atrial fibrillation status post IVC filter placement in 2017 with recurrent DVT this patient is on Eliquis, hyperlipidemia, hypertension, who presents to the emergency department for chief concerns of swelling of bilateral lower extremities. Vitals in the ED showed temperature of 98, respiration rate of 22, heart rate of 100, improved to 83, blood pressure 134/95, SpO2 of 88% on room air.  BNP was elevated at 2212.1.  High sensitive troponin was elevated 201--270--273.  D-dimer was elevated at 6.14.  VQ scan requested pending"   Further hospital course and management as outlined below.    Assessment and Plan:  Acute respiratory failure with hypoxia and hypercapnia Etiology workup in progress, differentials include heart failure exacerbation however given chest x-ray were unremarkable, and markedly elevated D-dimer, PE cannot be excluded at this time Unable to obtain CTA of the chest on admission as renal function does not allow VQ scan performed inconclusive Lower extremity Doppler negative for DVTs CTA chest 7/12 negative for PE 7/11 - VBG obtained due to lethargy - pCO2 88 - bipap ordered by pt refused it 7/12 - mentation improved, pCO2 improved without bipap --Supplement O2 to maintain spO2 > 90%, wean as tolerated  Acute on Chronic HFrEF (heart failure with reduced ejection fraction) (HCC) Appears to be in acute decompensation on admission Echocardiogram 12/22/22 - EF 50-55%, grade II DD, + LV WMA's, mild MR, late systolic MV prolapse BNP significantly elevated 2212 Undergoing diuresis with Lasix drip Net IO Since Admission: -19,716.7 mL [12/30/22 1543]  --Cardiology and Nephrology consulted -  appreciate mgmt --Transitioned from Lasix drip >>  IV Lasix 60 mg BID on 7/13--7/14 --Lasix now on hold due to Cr rising --Given metolazone also on 7/11 --Acetazolamide 500 mg PO BID per Nephrology -- now dc'd --Monitor daily weights, strict I/O's --Monitor renal function, electrolytes --Continue hydralazine, Imdur --Started on metoprolol  --Low sodium diet  Right pleural effusion - contributing to his hypoxia Thoracentesis attempted (7/15) but no large enough fluid pocket was seen to do safely. Procedure was not performed. Would try to re-attempt thoracentesis so O2 can be weaned  Diuresis per cardiology Pulmonology follow up has been arranged with [facility] -- 01/20/23 @ 1:30 will be on his dc paperwork  Hematuria - noted on 7/16, dark red urine in pure-wick canister Pt has history of kidney stones.  Denies dysuria.  Has mild flank pain. --Check UA --Monitor Hbg  Sinus tachycardia - HR 120's to as high as 140 initially sinus but converted to A-fib.   New Onset A-fib with RVR PE ruled out.  No clear evidence of infection to suggest sepsis. Has moderate R pleural effusion and compressive atelectasis, likely contributing. ?if getting intravascularly dry with diuresis at this point, on exam still seems volume overloaded --Cardiology following - appreciate management --Home Coreg was held due to acute CHF --Started Metoprolol by cardiology --Started on Eliquis 5 mg PO BID --PRN IV metoprolol if HR sustains > 110 bpm --Telemetry  Hypokalemia -- has needed ongoing replacement with diuresis K 3.0 this AM --Further replacement underway --Monitor BMP  Hypomagnesemia - Mg 1.4 (7/13) Mg 2.2 last check 7/15 --Monitor Mg level & replace PRN for goal > 2.0  Acute metabolic encephalopathy - due to  hypercapnic respiratory failure --BiPAP for ventilation --Monitor blood gas --Delirium precautions   Elevated troponin due to demand ischemia in setting of decompensated CHF No chest  pain. Cardiology following Telemetry monitoring VQ scan inconclusive for PE CTA negative for PE  Acute kidney injury - improving Due to cardiorenal syndrome in setting of CHF decompensation Patient presented with creatinine 2.47 however last creatinine was 1.11-year ago --Diuretics held 7/14 with Cr rise --Nephrology  consulted --Renally dose meds, avoid nephrotoxins --Monitor BMP  History of pulmonary embolism (HCC) Patient endorses that his IVC filter from 2017 has never been removed Patient endorses compliance with home Eliquis, VQ scan was inconclusive CTA chest 7/12 negative for PE Doppler of lower extremity did not show any evidence of DVT --Transitioned from heparin drip >> [name]  Coronary artery disease Continue atorvastatin, aspirin resumed on admission   Essential hypertension Hydralazine 37.5 mg PO TID Hydralazine 5 mg IV PRN Coreg held due to acute CHF   Hyperlipidemia Continue atorvastatin   Obesity Body mass index is 36.46 kg/m. Complicates overall care and prognosis.   Recommend lifestyle modifications including physical activity and diet for weight loss and overall long-term health.     Subjective . Interval History Pt was up sitting up in bed this AM.  Reports feeling overall better.  We discussed his need for oxygen, inability to wean off so far.  Thoracentesis could not be done yesterday, not enough fluid to do it safely.  He would prefer not to have oxygen at time of discharge.  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  He does not know if he could drive if he needed oxygen.  Noted hematuria in the purewick canister.  He denies dysuria but does report a history of kidney stones.  Has some mild flank pain.  No fever/chills.       DVT prophylaxis: heparin per pharmacy   Code Status: full code  Diet: heart healthy    Physical Exam:  General exam: awake, alert, no acute distress HEENT: moist mucus membranes, hearing  grossly normal Respiratory system: CTAB, no wheezes or rhonchi, normal respiratory effort, on 4.5 L/min [place] o2 Cardiovascular system:  RRR, improved BLE edema Gastrointestinal system: soft, NT, ND Central nervous system: A&O.3, normal speech, no gross focal deficits Extremities: improved BLE edema (trace to 1+), TED hose on BLE's Skin: dry, intact, normal temperature Psychiatry: normal mood, flat affect, minimally conversant    Data Reviewed: Notable labs & studies ---  K 3.1, Cl 90, bicarb 37 improving contraction alkalosis, BUN 39,  Cr 1.15 >> 1.47 >> 1.53 >> 1.30 Ca 8.7   VQ scan 7/10 -- inconclusive for PE  CTA chest 7/12 -- negative for PE.  Moderate R pleural effusion with compressive atelectasis   Family Communication: Spoke with son, Demario by phone this afternoon [phone]  Disposition Plan: SNF recommended   Time spent: 40 minutes    Author: Pennie Banter, DO 12/30/2022 3:43 PM  For on call review [email or web address].

## 2022-12-30 NOTE — Progress Notes (Signed)
[facility]  ROUNDING NOTE   Subjective:   Patient seen resting quietly Easily aroused Remains on 3L [place] Lower extremity edema remains  Objective:  Vital signs in last 24 hours:  Temp:  [97.6 F (36.4 C)-98.8 F (37.1 C)] 97.6 F (36.4 C) (07/16 0845) Pulse Rate:  [60-74] 74 (07/16 0845) Resp:  [16-20] 18 (07/16 0845) BP: (110-130)/(69-89) 113/75 (07/16 0845) SpO2:  [100 %] 100 % (07/16 0845)  Weight change:  Filed Weights   12/22/22 1900  Weight: 127.1 kg    Intake/Output: I/O last 3 completed shifts: In: 465 [P.O.:465] Out: 1950 [Urine:1950]   Intake/Output this shift:  Total I/O In: -  Out: 550 [Urine:550]  Physical Exam: General: NAD, laying in bed  Head: Normocephalic, atraumatic.   Eyes: Anicteric  Lungs:  Basilar crackles, [place] 3 L O2  Heart: Regular rate and rhythm  Abdomen:  Soft, nontender, obese  Extremities: + peripheral edema.  Neurologic: Nonfocal, moving all four extremities  Skin: No lesions  Access: None    Basic Metabolic Panel: Recent Labs  Lab 12/25/22 0602 12/26/22 0448 12/27/22 0451 12/28/22 0434 12/29/22 0424 12/30/22 0434  NA 139 141 142 138 137 137  K 4.5 3.4* 3.1* 2.9* 3.6 3.1*  CL 96* 92* 86* 83* 88* 90*  CO2 34* 39* >45* 43* 40* 37*  GLUCOSE 128* 98 110* 103* 155* 120*  BUN 41* 36* 32* 35* 37* 39*  CREATININE 1.46* 1.24 1.15 1.47* 1.53* 1.30*  CALCIUM 8.8* 9.1 9.0 8.8* 8.9 8.7*  MG  --   --  1.4* 1.7 2.2  --   PHOS 3.8  --   --   --   --   --     Liver Function Tests: Recent Labs  Lab 12/25/22 0602  ALBUMIN 3.4*   No results for input(s): "LIPASE", "AMYLASE" in the last 168 hours. Recent Labs  Lab 12/25/22 1338  AMMONIA 37*    CBC: Recent Labs  Lab 12/24/22 0443 12/25/22 0602 12/26/22 0448 12/27/22 0451  WBC 8.1 9.1 10.3 9.3  HGB 14.1 13.5 13.2 14.0  HCT 46.3 45.7 41.3 43.1  MCV 105.0* 103.9* 97.6 94.9  PLT 210 194 188 197    Cardiac Enzymes: No results for input(s): "CKTOTAL",  "CKMB", "CKMBINDEX", "TROPONINI" in the last 168 hours.  BNP: Invalid input(s): "POCBNP"  CBG: No results for input(s): "GLUCAP" in the last 168 hours.  Microbiology: Results for orders placed or performed during the hospital encounter of 01/01/21  Resp Panel by RT-PCR (Flu A&B, Covid) Nasopharyngeal Swab     Status: None   Collection Time: 01/01/21  4:57 PM   Specimen: Nasopharyngeal Swab; Nasopharyngeal(NP) swabs in vial transport medium  Result Value Ref Range Status   SARS Coronavirus 2 by RT PCR NEGATIVE NEGATIVE Final    Comment: (NOTE) SARS-CoV-2 target nucleic acids are NOT DETECTED.  The SARS-CoV-2 RNA is generally detectable in upper respiratory specimens during the acute phase of infection. The lowest concentration of SARS-CoV-2 viral copies this assay can detect is 138 copies/mL. A negative result does not preclude SARS-Cov-2 infection and should not be used as the sole basis for treatment or other patient management decisions. A negative result may occur with  improper specimen collection/handling, submission of specimen other than nasopharyngeal swab, presence of viral mutation(s) within the areas targeted by this assay, and inadequate number of viral copies(<138 copies/mL). A negative result must be combined with clinical observations, patient history, and epidemiological information. The expected result is Negative.  Fact Sheet for Patients:  BloggerCourse.com  Fact Sheet for Healthcare Providers:  SeriousBroker.it  This test is no t yet approved or cleared by the [place] FDA and  has been authorized for detection and/or diagnosis of SARS-CoV-2 by FDA under an Emergency Use Authorization (EUA). This EUA will remain  in effect (meaning this test can be used) for the duration of the COVID-19 declaration under Section 564(b)(1) of the Act, 21 U.S.C.section 360bbb-3(b)(1), unless the authorization is  terminated  or revoked sooner.       Influenza A by PCR NEGATIVE NEGATIVE Final   Influenza B by PCR NEGATIVE NEGATIVE Final    Comment: (NOTE) The Xpert Xpress SARS-CoV-2/FLU/RSV plus assay is intended as an aid in the diagnosis of influenza from Nasopharyngeal swab specimens and should not be used as a sole basis for treatment. Nasal washings and aspirates are unacceptable for Xpert Xpress SARS-CoV-2/FLU/RSV testing.  Fact Sheet for Patients: BloggerCourse.com  Fact Sheet for Healthcare Providers: SeriousBroker.it  This test is not yet approved or cleared by the [place] FDA and has been authorized for detection and/or diagnosis of SARS-CoV-2 by FDA under an Emergency Use Authorization (EUA). This EUA will remain in effect (meaning this test can be used) for the duration of the COVID-19 declaration under Section 564(b)(1) of the Act, 21 U.S.C. section 360bbb-3(b)(1), unless the authorization is terminated or revoked.  Performed at [facility], [address] [place], [place] [address]     Coagulation Studies: No results for input(s): "LABPROT", "INR" in the last 72 hours.  Urinalysis: No results for input(s): "COLORURINE", "LABSPEC", "PHURINE", "GLUCOSEU", "HGBUR", "BILIRUBINUR", "KETONESUR", "PROTEINUR", "UROBILINOGEN", "NITRITE", "LEUKOCYTESUR" in the last 72 hours.  Invalid input(s): "APPERANCEUR"     Imaging: [place] CHEST (PLEURAL EFFUSION)  Result Date: 12/29/2022 INDICATION: Right pleural effusion EXAM: CHEST ULTRASOUND COMPARISON:  CTA chest 12/26/2022 FINDINGS: Small right-sided pleural effusion visualizes. No significant pocket of fluid or percutaneous window to allow safe thoracentesis especially given the motion of the lung visualized to the chest wall. Risks of thoracentesis today outweigh the benefits. IMPRESSION: Small right-sided pleural effusion with no safe window for percutaneous access.  Thoracentesis was not performed today. Read by: Mina Marble, PA-C Electronically Signed   By: Olive Bass M.D.   On: 12/29/2022 12:40     Medications:      acetaZOLAMIDE  500 mg Oral BID   apixaban  5 mg Oral BID   aspirin EC  81 mg Oral Daily   atorvastatin  40 mg Oral QHS   dextromethorphan-guaiFENesin  1 tablet Oral BID   hydrALAZINE  37.5 mg Oral TID   isosorbide mononitrate  30 mg Oral Daily   metoprolol tartrate  25 mg Oral BID   potassium chloride  40 mEq Oral Q4H   metoprolol tartrate, senna-docusate  Assessment/ Plan:  [name] is a 67 y.o.  male with a PMHx of chronic diastolic heart failure, morbid obesity, atrial fibrillation, history of IVC placement, history of recurrent DVT, hyperlipidemia, hypertension, who was admitted to [facility] on 12/22/2022 for Elevated d-dimer [R79.89] Acute hypoxemic respiratory failure (HCC) [J96.01] Acute on chronic congestive heart failure, unspecified heart failure type (HCC) [I50.9]   Acute kidney injury/chronic kidney disease stage II baseline creatinine 1.18 with EGFR 69. Suspect that the patient's acute kidney injury is related to cardiorenal syndrome. Renal ultrasound negative for hydronephrosis.  - Creatinine stable   - Diuretics remain held  - Will schedule follow up appt in our office at discharge.  Lab Results  Component Value Date   CREATININE 1.30 (H) 12/30/2022   CREATININE 1.53 (H) 12/29/2022   CREATININE 1.47 (H) 12/28/2022    Intake/Output Summary (Last 24 hours) at 12/30/2022 1204 Last data filed at 12/30/2022 0715 Gross per 24 hour  Intake 465 ml  Output 1050 ml  Net -585 ml    2.  Acute exacerbation on chronic diastolic heart failure.   - furosemide held - acetazolamide.    3.   Metabolic alkalosis: secondary to contraction from fursoemide.  - acetazolamide   4. Acute respiratory failure: requiring supplemental oxygen.  - Thoracentesis for pleural effusion not attempted due to lack of safe  window.    LOS: 8 Patrici Minnis 7/16/202412:04 PM

## 2022-12-30 NOTE — Plan of Care (Signed)
  Problem: Education: Goal: Knowledge of General Education information will improve Description: Including pain rating scale, medication(s)/side effects and non-pharmacologic comfort measures Outcome: Progressing   Problem: Coping: Goal: Level of anxiety will decrease Outcome: Progressing   Problem: Nutrition: Goal: Adequate nutrition will be maintained Outcome: Progressing   Problem: Elimination: Goal: Will not experience complications related to bowel motility Outcome: Progressing Goal: Will not experience complications related to urinary retention Outcome: Progressing   Problem: Safety: Goal: Ability to remain free from injury will improve Outcome: Progressing

## 2022-12-30 NOTE — Progress Notes (Signed)
Rounding Note    Patient Name: Frederick Perry Date of Encounter: 12/30/2022  [facility] Cardiologist: Debbe Odea, MD   Subjective   He denies chest pain, dyspnea, palpitations, dizziness, presyncope, or syncope.  Renal function improving from 37/1.53 to 39/1.3 with holding of diuretic.  BP stable.  Inpatient Medications    Scheduled Meds:  acetaZOLAMIDE  500 mg Oral BID   apixaban  5 mg Oral BID   aspirin EC  81 mg Oral Daily   atorvastatin  40 mg Oral QHS   dextromethorphan-guaiFENesin  1 tablet Oral BID   hydrALAZINE  37.5 mg Oral TID   isosorbide mononitrate  30 mg Oral Daily   metoprolol tartrate  25 mg Oral BID   Continuous Infusions:   PRN Meds: metoprolol tartrate, senna-docusate   Vital Signs    Vitals:   12/29/22 1954 12/29/22 2353 12/30/22 0410 12/30/22 0845  BP: 115/69 110/75 130/89 113/75  Pulse: 70 60 66 74  Resp: 18 19 16 18   Temp: 97.9 F (36.6 C) 97.7 F (36.5 C) 97.6 F (36.4 C) 97.6 F (36.4 C)  TempSrc: Oral Oral    SpO2: 100% 100% 100% 100%  Weight:      Height:        Intake/Output Summary (Last 24 hours) at 12/30/2022 0847 Last data filed at 12/30/2022 0715 Gross per 24 hour  Intake 465 ml  Output 1050 ml  Net -585 ml      12/22/2022    7:00 PM 01/22/2022    2:53 PM 02/06/2021   10:41 AM  Last 3 Weights  Weight (lbs) 280 lb 3.3 oz 250 lb 4 oz 245 lb  Weight (kg) 127.1 kg 113.513 kg 111.131 kg      Telemetry    Sinus rhythm with PACs and PVCs - Personally Reviewed  ECG    No new tracings completed - Personally Reviewed  Physical Exam   GEN: No acute distress.  Sitting upright in bed watching his phone Neck: No JVD Cardiac: RRR, no murmurs, rubs, or gallops.  Occasional ectopy. Respiratory: Diminished breath sounds bilaterally to anterior auscultation.  Patient declined to sit up for posterior lung exam. GI: Soft, nontender, non-distended  MS: No edema; No deformity.  Compression stockings on  bilaterally Neuro:  Nonfocal  Psych: Normal affect   Labs    High Sensitivity Troponin:   Recent Labs  Lab 12/22/22 1620 12/22/22 1811 12/23/22 0129  TROPONINIHS 201* 270* 273*     Chemistry Recent Labs  Lab 12/25/22 0602 12/26/22 0448 12/27/22 0451 12/28/22 0434 12/29/22 0424 12/30/22 0434  NA 139   < > 142 138 137 137  K 4.5   < > 3.1* 2.9* 3.6 3.1*  CL 96*   < > 86* 83* 88* 90*  CO2 34*   < > >45* 43* 40* 37*  GLUCOSE 128*   < > 110* 103* 155* 120*  BUN 41*   < > 32* 35* 37* 39*  CREATININE 1.46*   < > 1.15 1.47* 1.53* 1.30*  CALCIUM 8.8*   < > 9.0 8.8* 8.9 8.7*  MG  --   --  1.4* 1.7 2.2  --   ALBUMIN 3.4*  --   --   --   --   --   GFRNONAA 53*   < > >60 53* 50* >60  ANIONGAP 9   < > NOT CALCULATED 12 9 10    < > = values in this interval not displayed.  Lipids No results for input(s): "CHOL", "TRIG", "HDL", "LABVLDL", "LDLCALC", "CHOLHDL" in the last 168 hours.  Hematology Recent Labs  Lab 12/25/22 0602 12/26/22 0448 12/27/22 0451  WBC 9.1 10.3 9.3  RBC 4.40 4.23 4.54  HGB 13.5 13.2 14.0  HCT 45.7 41.3 43.1  MCV 103.9* 97.6 94.9  MCH 30.7 31.2 30.8  MCHC 29.5* 32.0 32.5  RDW 16.9* 16.7* 17.0*  PLT 194 188 197   Thyroid  Recent Labs  Lab 12/25/22 1338  TSH 1.358    BNP No results for input(s): "BNP", "PROBNP" in the last 168 hours.   DDimer  No results for input(s): "DDIMER" in the last 168 hours.    Radiology    [place] CHEST (PLEURAL EFFUSION)  Result Date: 12/29/2022 IMPRESSION: Small right-sided pleural effusion with no safe window for percutaneous access. Thoracentesis was not performed today. Read by: Mina Marble, PA-C Electronically Signed   By: Olive Bass M.D.   On: 12/29/2022 12:40    Cardiac Studies   TTE 12/22/22  1. Left ventricular ejection fraction, by estimation, is 50 to 55%. The  left ventricle has low normal function. The left ventricle demonstrates  regional wall motion abnormalities (see scoring diagram/findings for   description). Left ventricular diastolic   parameters are consistent with Grade II diastolic dysfunction  (pseudonormalization). Elevated left atrial pressure.   2. Right ventricular systolic function is mildly reduced. The right  ventricular size is mildly enlarged. There is mildly elevated pulmonary  artery systolic pressure.   3. Left atrial size was mildly dilated.   4. Right atrial size was mildly dilated.   5. The mitral valve is abnormal. Mild mitral valve regurgitation. No  evidence of mitral stenosis. There is mild late systolic prolapse of  posterior leaflet of the mitral valve. Cannot exclude a ruptured chord.   6. The aortic valve is tricuspid. There is moderate calcification of the  aortic valve. There is moderate thickening of the aortic valve. Aortic  valve regurgitation is not visualized. Aortic valve  sclerosis/calcification is present, without any evidence  of aortic stenosis.   7. The inferior vena cava is dilated in size with <50% respiratory  variability, suggesting right atrial pressure of 15 mmHg.   Patient Profile     67 y.o. male with a past medical history of CAD, cardiomyopathy, hypertension, type 2 diabetes, smoker, alcohol abuse, CKD stage II, DVT/PE with prior IVC filter in 2017 on apixaban for recurrent DVT in 2021, has been seen and evaluated for worsening leg swelling, hypoxia, and weight gain.  Assessment & Plan    Acute on chronic systolic and diastolic congestive heart failure -Much improved following significant diuresis, net -19.7 L for the admission -Diuretic on hold with volume depletion with contraction alkalosis, improving -When safe to do so from a nephrology perspective, would resume PTA torsemide 20 mg daily  Acute kidney injury on CKD II -Renal function improving with holding of diuretic -Appreciate nephrology assistance  Essential hypertension -Blood pressure is controlled  Ventricular bigeminy/NSVT/questionable MAT/SVT -Improved  with metoprolol to 25 mg twice daily  CAD with elevated high-sensitivity troponin -High-sensitivity troponin slightly elevated likely supply/demand mismatch in the setting of CHF -Prior ischemic workup in July 2022 with cardiac catheterization revealed chronically occluded mid LAD and chronically occluded ostial ramus, and OM2 and right PDA with moderate to severe stenosis in the mid left circumflex.  No good targets for PCI or CABG at that time. -Consider outpatient PET scan to evaluate degree of ischemia -Continue aspirin,  atorvastatin, isosorbide mononitrate  History of DVT/PE -IVC filter placed in 2017 -On apixaban 5 mg twice daily -Ultrasound of legs negative for DVT      For questions or updates, please contact [facility] Please consult [email or web address] for contact info under        Signed, Eula Listen, PA-C  12/30/2022, 8:47 AM

## 2022-12-30 NOTE — Progress Notes (Addendum)
Occupational Therapy Treatment Patient Details Name: Frederick Perry MRN: [id number] DOB: 10-03-1955 Today's Date: 12/30/2022   History of present illness Patient is a 67 year old male with heart failure reduced ejection fraction, morbid obesity, atrial fibrillation status post IVC filter placement in 2017 with recurrent DVT this patient is on Eliquis, hyperlipidemia, hypertension, who presents to the emergency department for chief concerns of swelling of bilateral lower extremities.   OT comments  Chart reviewed to date, pt greeted in bed, agreeable to OT tx session. Pt requests to amb in hall after self care tasks. MIN A for UB dressing, SET UP for any grooming/feeding tasks. Pt amb 200' with RW with CGA, intermittent vcs for safety/pacing. Spo2 below 90% on RA while standing the hallway, therefore donned 2L via [place] throughout mobility, RN present and aware. Pt spo2 appears above 90% while at rest on RA. HR monitored via telemetry box is under 100bpm during mobility.  Pt left on 2 L via [place], nurse aware. Pt is making progress towards all goals set fourth. OT will continue to follow acutely.    Recommendations for follow up therapy are one component of a multi-disciplinary discharge planning process, led by the attending physician.  Recommendations may be updated based on patient status, additional functional criteria and insurance authorization.    Assistance Recommended at Discharge Intermittent Supervision/Assistance  Patient can return home with the following  Two people to help with walking and/or transfers;A lot of help with bathing/dressing/bathroom;Assistance with cooking/housework;Assist for transportation;Direct supervision/assist for financial management;Direct supervision/assist for medications management;Help with stairs or ramp for entrance   Equipment Recommendations  BSC/3in1;Other (comment) (2WW)    Recommendations for Other Services      Precautions / Restrictions  Precautions Precautions: Fall Restrictions Weight Bearing Restrictions: No       Mobility Bed Mobility Overal bed mobility: Needs Assistance Bed Mobility: Supine to Sit     Supine to sit: Modified independent (Device/Increase time), HOB elevated          Transfers Overall transfer level: Needs assistance Equipment used: Rolling walker (2 wheels) Transfers: Sit to/from Stand Sit to Stand: Min guard                 Balance Overall balance assessment: Needs assistance Sitting-balance support: Feet supported Sitting balance-Leahy Scale: Good     Standing balance support: Bilateral upper extremity supported, During functional activity, Reliant on assistive device for balance Standing balance-Leahy Scale: Fair                             ADL either performed or assessed with clinical judgement   ADL Overall ADL's : Needs assistance/impaired                 Upper Body Dressing : Minimal assistance;Sitting       Toilet Transfer: Supervision/safety;Min guard;Ambulation;Rolling walker (2 wheels) Toilet Transfer Details (indicate cue type and reason): simulated         Functional mobility during ADLs: Min guard;Rolling walker (2 wheels) (approx 200' in hallway)      Extremity/Trunk Assessment              Vision       Perception     Praxis      Cognition Arousal/Alertness: Awake/alert Behavior During Therapy: WFL for tasks assessed/performed Overall Cognitive Status: Within Functional Limits for tasks assessed Area of Impairment: Safety/judgement  Safety/Judgement: Decreased awareness of deficits (mild- pt is eager to return to PLOF, responds to vcs for pacing)              Exercises      Shoulder Instructions       General Comments      Pertinent Vitals/ Pain       Pain Assessment Pain Assessment: No/denies pain  Home Living                                           Prior Functioning/Environment              Frequency  Min 1X/week        Progress Toward Goals  OT Goals(current goals can now be found in the care plan section)  Progress towards OT goals: Progressing toward goals     Plan Frequency remains appropriate;Discharge plan needs to be updated    Co-evaluation                 AM-PAC OT "6 Clicks" Daily Activity     Outcome Measure   Help from another person eating meals?: None Help from another person taking care of personal grooming?: None Help from another person toileting, which includes using toliet, bedpan, or urinal?: A Little Help from another person bathing (including washing, rinsing, drying)?: A Little Help from another person to put on and taking off regular upper body clothing?: None Help from another person to put on and taking off regular lower body clothing?: A Little 6 Click Score: 21    End of Session Equipment Utilized During Treatment: Rolling walker (2 wheels)  OT Visit Diagnosis: Unsteadiness on feet (R26.81);Muscle weakness (generalized) (M62.81)   Activity Tolerance Patient tolerated treatment well   Patient Left with call bell/phone within reach;in chair;with chair alarm set   Nurse Communication Mobility status        Time: 1437-1510 OT Time Calculation (min): 33 min  Charges: OT General Charges $OT Visit: 1 Visit OT Treatments $Self Care/Home Management : 8-22 mins $Therapeutic Activity: 8-22 mins  Oleta Mouse, OTD OTR/L  12/30/22, 3:48 PM

## 2022-12-30 NOTE — TOC Progression Note (Addendum)
Transition of Care [facility]) - Progression Note    Patient Details  Name: Frederick Perry MRN: [id number] Date of Birth: Apr 03, 1956  [facility] CM/SW Contact  Truddie Hidden, RN Phone Number: 12/30/2022, 4:32 PM  Clinical Narrative:    Spoke with patient regarding his discharge plan. Patient advised his recommendation has changed to [facility]. He stated he needed time to think about it. RNCM will follow up.    Expected Discharge Plan:  (TBD) Barriers to Discharge: Continued Medical Work up  Expected Discharge Plan and Services     Post Acute Care Choice:  (TBD) Living arrangements for the past 2 months:  (Truck)                                       Social Determinants of Health (SDOH) Interventions SDOH Screenings   Food Insecurity: No Food Insecurity (12/23/2022)  Housing: Low Risk  (12/23/2022)  Transportation Needs: No Transportation Needs (12/23/2022)  Utilities: Not At Risk (12/23/2022)  Financial Resource Strain: Low Risk  (12/22/2022)   Received from [facility], [facility]  Physical Activity: Unknown (02/04/2018)  Social Connections: Unknown (02/04/2018)  Tobacco Use: High Risk (12/23/2022)    Readmission Risk Interventions     No data to display

## 2022-12-31 DIAGNOSIS — J9601 Acute respiratory failure with hypoxia: Secondary | ICD-10-CM | POA: Diagnosis not present

## 2022-12-31 LAB — BASIC METABOLIC PANEL
Anion gap: 9 (ref 5–15)
BUN: 38 mg/dL — ABNORMAL HIGH (ref 8–23)
CO2: 34 mmol/L — ABNORMAL HIGH (ref 22–32)
Calcium: 8.8 mg/dL — ABNORMAL LOW (ref 8.9–10.3)
Chloride: 94 mmol/L — ABNORMAL LOW (ref 98–111)
Creatinine, Ser: 1.29 mg/dL — ABNORMAL HIGH (ref 0.61–1.24)
GFR, Estimated: >60 mL/min (ref >60)
Glucose, Bld: 131 mg/dL — ABNORMAL HIGH (ref 70–99)
Potassium: 3.4 mmol/L — ABNORMAL LOW (ref 3.5–5.1)
Sodium: 137 mmol/L (ref 135–145)

## 2022-12-31 LAB — MAGNESIUM: Magnesium: 2.3 mg/dL (ref 1.7–2.4)

## 2022-12-31 LAB — CBC
HCT: 46 % (ref 39.0–52.0)
Hemoglobin: 14.1 g/dL (ref 13.0–17.0)
MCH: 31.1 pg (ref 26.0–34.0)
MCHC: 30.7 g/dL (ref 30.0–36.0)
MCV: 101.3 fL — ABNORMAL HIGH (ref 80.0–100.0)
Platelets: 185 10*3/uL (ref 150–400)
RBC: 4.54 MIL/uL (ref 4.22–5.81)
RDW: 16.4 % — ABNORMAL HIGH (ref 11.5–15.5)
WBC: 6.5 10*3/uL (ref 4.0–10.5)
nRBC: 0 % (ref 0.0–0.2)

## 2022-12-31 MED ORDER — POTASSIUM CHLORIDE CRYS ER 20 MEQ PO TBCR
40.0000 meq | EXTENDED_RELEASE_TABLET | ORAL | Status: AC
  Administered 2022-12-31 (×2): 40 meq via ORAL
  Filled 2022-12-31 (×2): qty 2

## 2022-12-31 NOTE — Progress Notes (Signed)
Physical Therapy Treatment Patient Details Name: Frederick Perry MRN: [id number] DOB: 1956-05-24 Today's Date: 12/31/2022   History of Present Illness Patient is a 67 year old male with heart failure reduced ejection fraction, morbid obesity, atrial fibrillation status post IVC filter placement in 2017 with recurrent DVT this patient is on Eliquis, hyperlipidemia, hypertension, who presents to the emergency department for chief concerns of swelling of bilateral lower extremities.    PT Comments  Pt supine in bed watching TV on his phone, agreeable to walking with PT. Spo2 on RA 91%, O2 remained off for ambulation. Supine<>sit mod I for increased time and bed rail use. STS from elevated surface mod I w/ RW. Ambulated 47ft with RW, Pt complained of light headedness. Spo2 81% after ambulating on RA. Pt placed back on 3L O2 for continued duration of ambulation (120 ft) and maintained Spo2 at 91%. Pt sat EOB for rest, Spo2 on 3L 93% and not increasing. Encouraged Pt to PLB, Spo2 increased to 98%. Pt returned to bed with call bell and alarm set, left on 3L of O2.    Assistance Recommended at Discharge Intermittent Supervision/Assistance  If plan is discharge home, recommend the following:  Can travel by private vehicle    A little help with walking and/or transfers;A little help with bathing/dressing/bathroom;Assistance with cooking/housework;Assist for transportation;Help with stairs or ramp for entrance   Yes  Equipment Recommendations  Rolling walker (2 wheels)    Recommendations for Other Services       Precautions / Restrictions Precautions Precautions: Fall Precaution Comments: watch HR Restrictions Weight Bearing Restrictions: No     Mobility  Bed Mobility Overal bed mobility: Needs Assistance Bed Mobility: Supine to Sit, Sit to Supine     Supine to sit: Modified independent (Device/Increase time), HOB elevated Sit to supine: Modified independent (Device/Increase time)    General bed mobility comments: Increased time needed for movement. Appropriate sequencing.    Transfers Overall transfer level: Needs assistance Equipment used: Rolling walker (2 wheels) Transfers: Sit to/from Stand Sit to Stand: Min guard           General transfer comment: Education on hand placement with RW, slightly pulls walker when coming in to standing    Ambulation/Gait Ambulation/Gait assistance: Min guard Gait Distance (Feet): 200 Feet Assistive device: Rolling walker (2 wheels) Gait Pattern/deviations: Step-through pattern, Wide base of support (toeing out)       General Gait Details: demonstrates slight toeing out when ambulating. Ambulated with out oxygen for 80 ft. Pt placed back on 3L of O2 due to stats being in the low 80s while walking.   Stairs             Wheelchair Mobility     Tilt Bed    Modified Rankin (Stroke Patients Only)       Balance Overall balance assessment: Needs assistance Sitting-balance support: Feet supported Sitting balance-Leahy Scale: Good     Standing balance support: Bilateral upper extremity supported, During functional activity, Reliant on assistive device for balance Standing balance-Leahy Scale: Fair                              Cognition Arousal/Alertness: Awake/alert Behavior During Therapy: WFL for tasks assessed/performed Overall Cognitive Status: Within Functional Limits for tasks assessed  Exercises      General Comments        Pertinent Vitals/Pain Pain Assessment Pain Assessment: No/denies pain    Home Living                          Prior Function            PT Goals (current goals can now be found in the care plan section) Acute Rehab PT Goals Patient Stated Goal: to return to prior level of independence PT Goal Formulation: With patient Time For Goal Achievement: 01/08/23 Potential to Achieve  Goals: Good Progress towards PT goals: Progressing toward goals    Frequency    Min 1X/week      PT Plan Current plan remains appropriate    Co-evaluation              AM-PAC PT "6 Clicks" Mobility   Outcome Measure  Help needed turning from your back to your side while in a flat bed without using bedrails?: A Little Help needed moving from lying on your back to sitting on the side of a flat bed without using bedrails?: A Little Help needed moving to and from a bed to a chair (including a wheelchair)?: A Little Help needed standing up from a chair using your arms (e.g., wheelchair or bedside chair)?: A Little Help needed to walk in hospital room?: A Little Help needed climbing 3-5 steps with a railing? : A Little 6 Click Score: 18    End of Session Equipment Utilized During Treatment: Oxygen;Gait belt Activity Tolerance: Patient limited by fatigue Patient left: in bed;with call bell/phone within reach;with bed alarm set Nurse Communication: Mobility status PT Visit Diagnosis: Muscle weakness (generalized) (M62.81);Unsteadiness on feet (R26.81);Difficulty in walking, not elsewhere classified (R26.2)     Time: 1308-6578 PT Time Calculation (min) (ACUTE ONLY): 19 min  Charges:                            Malachi Carl, SPT    Malachi Carl 12/31/2022, 4:55 PM

## 2022-12-31 NOTE — TOC Progression Note (Signed)
Transition of Care [facility]) - Progression Note    Patient Details  Name: Frederick Perry MRN: [id number] Date of Birth: July 08, 1955  Transition of Care [facility]) CM/SW Contact  Truddie Hidden, RN Phone Number: 12/31/2022, 11:37 AM  Clinical Narrative:    Spoke with patient at bedside regarding his discharge plan. RNCM educated patient about LTACH vs SNF. He has also been advised about locations for [facility] being in [place]. He is agreeable to more information from a [facility] Liaison.  RNCM notified Liaisons Jenn and Latoya from [facility]. MD notified as well.    Expected Discharge Plan:  (TBD) Barriers to Discharge: Continued Medical Work up  Expected Discharge Plan and Services     Post Acute Care Choice:  (TBD) Living arrangements for the past 2 months:  (Truck)                                       Social Determinants of Health (SDOH) Interventions SDOH Screenings   Food Insecurity: No Food Insecurity (12/23/2022)  Housing: Low Risk  (12/23/2022)  Transportation Needs: No Transportation Needs (12/23/2022)  Utilities: Not At Risk (12/23/2022)  Financial Resource Strain: Low Risk  (12/22/2022)   Received from [facility], [facility]  Physical Activity: Unknown (02/04/2018)  Social Connections: Unknown (02/04/2018)  Tobacco Use: High Risk (12/23/2022)    Readmission Risk Interventions     No data to display

## 2022-12-31 NOTE — Plan of Care (Signed)
  Problem: Education: Goal: Knowledge of General Education information will improve Description Including pain rating scale, medication(s)/side effects and non-pharmacologic comfort measures Outcome: Progressing   

## 2022-12-31 NOTE — Progress Notes (Signed)
Rounding Note    Patient Name: Frederick Perry Date of Encounter: 12/31/2022  [facility] Cardiologist: Debbe Odea, MD   Subjective   No chest pain, dyspnea, palpitations, dizziness, presyncope, or syncope.  Renal function and BP stable.  Inpatient Medications    Scheduled Meds:  apixaban  5 mg Oral BID   aspirin EC  81 mg Oral Daily   atorvastatin  40 mg Oral QHS   dextromethorphan-guaiFENesin  1 tablet Oral BID   hydrALAZINE  37.5 mg Oral TID   isosorbide mononitrate  30 mg Oral Daily   metoprolol succinate  50 mg Oral Daily   Continuous Infusions:   PRN Meds: metoprolol tartrate, senna-docusate   Vital Signs    Vitals:   12/30/22 2021 12/30/22 2339 12/31/22 0318 12/31/22 0832  BP: 123/67 121/78 120/70 108/69  Pulse: 70 68 68 94  Resp: 20 14 18 17   Temp: 98.5 F (36.9 C) 98.6 F (37 C) 97.7 F (36.5 C) 98.1 F (36.7 C)  TempSrc:      SpO2: 100% 100% 99% 96%  Weight:      Height:        Intake/Output Summary (Last 24 hours) at 12/31/2022 0858 Last data filed at 12/30/2022 2339 Gross per 24 hour  Intake 360 ml  Output 800 ml  Net -440 ml      12/22/2022    7:00 PM 01/22/2022    2:53 PM 02/06/2021   10:41 AM  Last 3 Weights  Weight (lbs) 280 lb 3.3 oz 250 lb 4 oz 245 lb  Weight (kg) 127.1 kg 113.513 kg 111.131 kg      Telemetry    Sinus rhythm with PACs and PVCs, 60s to 80s bpm - Personally Reviewed  ECG    No new tracings completed - Personally Reviewed  Physical Exam   GEN: No acute distress.  Sitting upright in bed watching his phone Neck: No JVD Cardiac: RRR, no murmurs, rubs, or gallops.   Respiratory: Clear to auscultation bilaterally. GI: Soft, nontender, non-distended  MS: No edema; No deformity.  Compression stockings on bilaterally Neuro:  Nonfocal  Psych: Normal affect   Labs    High Sensitivity Troponin:   Recent Labs  Lab 12/22/22 1620 12/22/22 1811 12/23/22 0129  TROPONINIHS 201* 270* 273*      Chemistry Recent Labs  Lab 12/25/22 0602 12/26/22 0448 12/28/22 0434 12/29/22 0424 12/30/22 0434 12/31/22 0407  NA 139   < > 138 137 137 137  K 4.5   < > 2.9* 3.6 3.1* 3.4*  CL 96*   < > 83* 88* 90* 94*  CO2 34*   < > 43* 40* 37* 34*  GLUCOSE 128*   < > 103* 155* 120* 131*  BUN 41*   < > 35* 37* 39* 38*  CREATININE 1.46*   < > 1.47* 1.53* 1.30* 1.29*  CALCIUM 8.8*   < > 8.8* 8.9 8.7* 8.8*  MG  --    < > 1.7 2.2  --  2.3  ALBUMIN 3.4*  --   --   --   --   --   GFRNONAA 53*   < > 53* 50* >60 >60  ANIONGAP 9   < > 12 9 10 9    < > = values in this interval not displayed.    Lipids No results for input(s): "CHOL", "TRIG", "HDL", "LABVLDL", "LDLCALC", "CHOLHDL" in the last 168 hours.  Hematology Recent Labs  Lab 12/25/22 0602 12/26/22 0448  12/27/22 0451  WBC 9.1 10.3 9.3  RBC 4.40 4.23 4.54  HGB 13.5 13.2 14.0  HCT 45.7 41.3 43.1  MCV 103.9* 97.6 94.9  MCH 30.7 31.2 30.8  MCHC 29.5* 32.0 32.5  RDW 16.9* 16.7* 17.0*  PLT 194 188 197   Thyroid  Recent Labs  Lab 12/25/22 1338  TSH 1.358    BNP No results for input(s): "BNP", "PROBNP" in the last 168 hours.   DDimer  No results for input(s): "DDIMER" in the last 168 hours.    Radiology    [place] CHEST (PLEURAL EFFUSION)  Result Date: 12/29/2022 IMPRESSION: Small right-sided pleural effusion with no safe window for percutaneous access. Thoracentesis was not performed today. Read by: Mina Marble, PA-C Electronically Signed   By: Olive Bass M.D.   On: 12/29/2022 12:40    Cardiac Studies   TTE 12/22/22  1. Left ventricular ejection fraction, by estimation, is 50 to 55%. The  left ventricle has low normal function. The left ventricle demonstrates  regional wall motion abnormalities (see scoring diagram/findings for  description). Left ventricular diastolic   parameters are consistent with Grade II diastolic dysfunction  (pseudonormalization). Elevated left atrial pressure.   2. Right ventricular systolic  function is mildly reduced. The right  ventricular size is mildly enlarged. There is mildly elevated pulmonary  artery systolic pressure.   3. Left atrial size was mildly dilated.   4. Right atrial size was mildly dilated.   5. The mitral valve is abnormal. Mild mitral valve regurgitation. No  evidence of mitral stenosis. There is mild late systolic prolapse of  posterior leaflet of the mitral valve. Cannot exclude a ruptured chord.   6. The aortic valve is tricuspid. There is moderate calcification of the  aortic valve. There is moderate thickening of the aortic valve. Aortic  valve regurgitation is not visualized. Aortic valve  sclerosis/calcification is present, without any evidence  of aortic stenosis.   7. The inferior vena cava is dilated in size with <50% respiratory  variability, suggesting right atrial pressure of 15 mmHg.   Patient Profile     67 y.o. male with a past medical history of CAD, cardiomyopathy, hypertension, type 2 diabetes, smoker, alcohol abuse, CKD stage II, DVT/PE with prior IVC filter in 2017 on apixaban for recurrent DVT in 2021, has been seen and evaluated for worsening leg swelling, hypoxia, and weight gain.  Assessment & Plan    Acute on chronic systolic and diastolic congestive heart failure -Much improved following significant diuresis, net -20.1 L for the admission -Diuretic on hold with volume depletion with contraction alkalosis, improving -When safe to do so from a nephrology perspective, would resume PTA torsemide 20 mg daily -Continue Toprol-XL, hydralazine, and isosorbide mononitrate -Look to escalate GDMT in the outpatient setting as vitals and renal function allow condition ACE inhibitor/ARB/ARNI, MRA, and SGLT2 inhibitor  Acute kidney injury on CKD II -Renal function improving with holding of diuretic -Appreciate nephrology assistance  Essential hypertension -Blood pressure is controlled  Ventricular bigeminy/NSVT/questionable  MAT/SVT -Improved with metoprolol to 25 mg twice daily  CAD with elevated high-sensitivity troponin -High-sensitivity troponin slightly elevated likely supply/demand mismatch in the setting of CHF -Prior ischemic workup in July 2022 with cardiac catheterization revealed chronically occluded mid LAD and chronically occluded ostial ramus, and OM2 and right PDA with moderate to severe stenosis in the mid left circumflex.  No good targets for PCI or CABG at that time. -Consider outpatient PET scan to evaluate degree of ischemia -Continue  aspirin, atorvastatin, isosorbide mononitrate  History of DVT/PE -IVC filter placed in 2017 -On apixaban 5 mg twice daily -Ultrasound of legs negative for DVT      For questions or updates, please contact [facility] Please consult [email or web address] for contact info under        Signed, Eula Listen, PA-C  12/31/2022, 8:58 AM

## 2022-12-31 NOTE — Progress Notes (Signed)
PROGRESS NOTE    Frederick Perry  [name]:[id number] DOB: 1955/10/14 DOA: 12/22/2022 PCP: Enid Baas, MD  [id number]  LOS: 9 days   Brief hospital course:   Assessment & Plan: [name] is a 67 year old male with heart failure reduced ejection fraction, morbid obesity, atrial fibrillation status post IVC filter placement in 2017 with recurrent DVT this patient is on Eliquis, hyperlipidemia, hypertension, who presents to the emergency department for chief concerns of swelling of bilateral lower extremities.   Acute respiratory failure with hypoxia and hypercapnia CTA chest 7/12 negative for PE 7/11 - VBG obtained due to lethargy - pCO2 88 - bipap ordered by pt refused it 7/12 - mentation improved, pCO2 improved without bipap --Continue supplemental O2 to keep sats >=90%, wean as tolerated  Acute on Chronic HFrEF (heart failure with reduced ejection fraction) (HCC) Appears to be in acute decompensation on admission Echocardiogram 12/22/22 - EF 50-55%, grade II DD, + LV WMA's, mild MR, late systolic MV prolapse BNP significantly elevated 2212 --Cardiology and Nephrology consulted - appreciate mgmt Undergoing diuresis with Lasix drip --Transitioned from Lasix drip >>  IV Lasix 60 mg BID on 7/13--7/14 --Lasix now on hold due to Cr rising --cont Toprol   Right pleural effusion  - may contribute to his hypoxia Thoracentesis attempted (7/15) but no large enough fluid pocket was seen to do safely. --s/p diuresis Pulmonology follow up has been arranged with [facility] -- 01/20/23 @ 1:30    Hematuria - noted on 7/16, dark red urine in pure-wick canister Pt has history of kidney stones.  Denies dysuria.  Has mild flank pain. --Hgb stable --Hold Eliquis   New Onset A-fib with RVR --Cardiology following - appreciate management --Home Coreg was held due to acute CHF --cont Toprol (new) --hold Eliquis due to hematuria   Hypokalemia  --monitor and replete PRN    Hypomagnesemia  --monitor and replete PRN   Acute metabolic encephalopathy - due to hypercapnic respiratory failure.  Improved.   Elevated troponin due to demand ischemia in setting of decompensated CHF No chest pain.   Acute kidney injury - improving Due to cardiorenal syndrome in setting of CHF decompensation Patient presented with creatinine 2.47 however last creatinine was 1.11-year ago --Diuretics held 7/14 with Cr rise --Nephrology  consulted   History of pulmonary embolism [facility]) Patient endorses that his IVC filter from 2017 has never been removed Patient endorses compliance with home Eliquis, CTA chest 7/12 negative for PE Doppler of lower extremity did not show any evidence of DVT --Transitioned from heparin drip >> [name] --hold Eliquis today due to hematuria   Coronary artery disease Continue atorvastatin and ASA   Essential hypertension --cont hydralazine, Imdur and Toprol   Hyperlipidemia Continue atorvastatin    Obesity Body mass index is 36.46 kg/m. Complicates overall care and prognosis.   Recommend lifestyle modifications including physical activity and diet for weight loss and overall long-term health.     DVT prophylaxis: SCD/Compression stockings Code Status: Full code  Family Communication:  Level of care: Telemetry Cardiac Dispo:   The patient is from: Truck Anticipated d/c is to: undetermined Anticipated d/c date is: undetermined   Subjective and Interval History:  RN reported gross hematuria.  Pt denied dysuria, dyspnea.     Objective: Vitals:   12/31/22 0832 12/31/22 1139 12/31/22 1711 12/31/22 2139  BP: 108/69 118/66 124/76 119/76  Pulse: 94 (!) 48 68 70  Resp: 17 16 20 17   Temp: 98.1 F (36.7 C) 98.2 F (36.8 C)  98.9 F (37.2 C) 98.3 F (36.8 C)  TempSrc:      SpO2: 96% 100% 93% 99%  Weight:      Height:        Intake/Output Summary (Last 24 hours) at 12/31/2022 2149 Last data filed at 12/31/2022 1729 Gross per 24  hour  Intake 910 ml  Output 1120 ml  Net -210 ml   Filed Weights   12/22/22 1900  Weight: 127.1 kg    Examination:   Constitutional: NAD, AAOx3 HEENT: conjunctivae and lids normal, EOMI CV: No cyanosis.   RESP: normal respiratory effort, on 3L Neuro: II - XII grossly intact.   Psych: Normal mood and affect.  Appropriate judgement and reason   Data Reviewed: I have personally reviewed labs and imaging studies  Time spent: 50 minutes  Darlin Priestly, MD [facility] If 7PM-7AM, please contact night-coverage 12/31/2022, 9:49 PM

## 2023-01-01 ENCOUNTER — Encounter: Payer: BC Managed Care – PPO | Admitting: Family

## 2023-01-01 DIAGNOSIS — J9601 Acute respiratory failure with hypoxia: Secondary | ICD-10-CM | POA: Diagnosis not present

## 2023-01-01 LAB — BASIC METABOLIC PANEL
Anion gap: 7 (ref 5–15)
BUN: 42 mg/dL — ABNORMAL HIGH (ref 8–23)
CO2: 34 mmol/L — ABNORMAL HIGH (ref 22–32)
Calcium: 8.8 mg/dL — ABNORMAL LOW (ref 8.9–10.3)
Chloride: 98 mmol/L (ref 98–111)
Creatinine, Ser: 1.4 mg/dL — ABNORMAL HIGH (ref 0.61–1.24)
GFR, Estimated: 56 mL/min — ABNORMAL LOW (ref >60)
Glucose, Bld: 157 mg/dL — ABNORMAL HIGH (ref 70–99)
Potassium: 3.9 mmol/L (ref 3.5–5.1)
Sodium: 139 mmol/L (ref 135–145)

## 2023-01-01 LAB — CBC
HCT: 42 % (ref 39.0–52.0)
Hemoglobin: 12.9 g/dL — ABNORMAL LOW (ref 13.0–17.0)
MCH: 31.2 pg (ref 26.0–34.0)
MCHC: 30.7 g/dL (ref 30.0–36.0)
MCV: 101.7 fL — ABNORMAL HIGH (ref 80.0–100.0)
Platelets: 183 10*3/uL (ref 150–400)
RBC: 4.13 MIL/uL — ABNORMAL LOW (ref 4.22–5.81)
RDW: 16.6 % — ABNORMAL HIGH (ref 11.5–15.5)
WBC: 6.9 10*3/uL (ref 4.0–10.5)
nRBC: 0 % (ref 0.0–0.2)

## 2023-01-01 LAB — MAGNESIUM: Magnesium: 2.6 mg/dL — ABNORMAL HIGH (ref 1.7–2.4)

## 2023-01-01 NOTE — Care Management Important Message (Signed)
Important Message  Patient Details  Name: Frederick Perry MRN: [id number] Date of Birth: 24-Apr-1956   Medicare Important Message Given:  Yes     Johnell Comings 01/01/2023, 2:22 PM

## 2023-01-01 NOTE — Progress Notes (Signed)
Occupational Therapy Treatment Patient Details Name: Frederick Perry MRN: [id number] DOB: Nov 03, 1955 Today's Date: 01/01/2023   History of present illness Patient is a 67 year old male with heart failure reduced ejection fraction, morbid obesity, atrial fibrillation status post IVC filter placement in 2017 with recurrent DVT this patient is on Eliquis, hyperlipidemia, hypertension, who presents to the emergency department for chief concerns of swelling of bilateral lower extremities.   OT comments  Pt seen for OT tx. Pt received in bed, slouched and malpositioned for eating/drinking breakfast which recently arrived. Pt agreeable to get OOB to recliner for meal. Pt completed bed mobility with +time/effort, no direct assist required. CGA for STS with RW and short in-room mobility using RW. On 2L throughout, SpO2 91% after mobility. RN notified at end of session when in to complete medication administration. Pt educated in ideal positioning for taking medication, feeding to decr risk of aspiration. Pt continues to benefit from skilled OT services.    Recommendations for follow up therapy are one component of a multi-disciplinary discharge planning process, led by the attending physician.  Recommendations may be updated based on patient status, additional functional criteria and insurance authorization.    Assistance Recommended at Discharge Intermittent Supervision/Assistance  Patient can return home with the following  A little help with walking and/or transfers;Assistance with cooking/housework;Assist for transportation;Help with stairs or ramp for entrance;A little help with bathing/dressing/bathroom   Equipment Recommendations  BSC/3in1;Other (comment) (2WW)    Recommendations for Other Services      Precautions / Restrictions Precautions Precautions: Fall Precaution Comments: watch HR Restrictions Weight Bearing Restrictions: No       Mobility Bed Mobility Overal bed mobility:  Needs Assistance Bed Mobility: Supine to Sit     Supine to sit: Modified independent (Device/Increase time), HOB elevated     General bed mobility comments: +time/effort but no direct assist required    Transfers Overall transfer level: Needs assistance Equipment used: Rolling walker (2 wheels) Transfers: Sit to/from Stand Sit to Stand: Min guard           General transfer comment: CGA, no VC needed for proper hand placement w RW     Balance Overall balance assessment: Needs assistance Sitting-balance support: Feet supported Sitting balance-Leahy Scale: Good     Standing balance support: Bilateral upper extremity supported, During functional activity, Reliant on assistive device for balance Standing balance-Leahy Scale: Fair                             ADL either performed or assessed with clinical judgement   ADL                                              Extremity/Trunk Assessment              Vision       Perception     Praxis      Cognition Arousal/Alertness: Awake/alert Behavior During Therapy: WFL for tasks assessed/performed Overall Cognitive Status: No family/caregiver present to determine baseline cognitive functioning                                 General Comments: decreased safety awareness, pt slouched in bed, malpositioned for eating/drinking breakfast requiring education  Exercises Other Exercises Other Exercises: Pt educated in ideal positioning for taking medication, feeding to decr risk of aspiration    Shoulder Instructions       General Comments On 2L throughout, after brief mobility, SpO2 91%, HR in 70's    Pertinent Vitals/ Pain       Pain Assessment Pain Assessment: No/denies pain  Home Living                                          Prior Functioning/Environment              Frequency  Min 1X/week        Progress Toward Goals  OT  Goals(current goals can now be found in the care plan section)  Progress towards OT goals: Progressing toward goals  Acute Rehab OT Goals Patient Stated Goal: get stronger and return to PLOF OT Goal Formulation: With patient Time For Goal Achievement: 01/08/23 Potential to Achieve Goals: Good  Plan Frequency remains appropriate;Discharge plan remains appropriate    Co-evaluation                 AM-PAC OT "6 Clicks" Daily Activity     Outcome Measure   Help from another person eating meals?: None Help from another person taking care of personal grooming?: None Help from another person toileting, which includes using toliet, bedpan, or urinal?: A Little Help from another person bathing (including washing, rinsing, drying)?: A Little Help from another person to put on and taking off regular upper body clothing?: None Help from another person to put on and taking off regular lower body clothing?: A Little 6 Click Score: 21    End of Session Equipment Utilized During Treatment: Rolling walker (2 wheels);Oxygen  OT Visit Diagnosis: Unsteadiness on feet (R26.81);Muscle weakness (generalized) (M62.81)   Activity Tolerance Patient tolerated treatment well   Patient Left in chair;with call bell/phone within reach;with chair alarm set;with nursing/sitter in room   Nurse Communication Mobility status;Other (comment) (SpO2 91%)        Time: 0981-1914 OT Time Calculation (min): 12 min  Charges: OT General Charges $OT Visit: 1 Visit OT Treatments $Self Care/Home Management : 8-22 mins  Arman Filter., MPH, MS, OTR/L ascom [phone] 01/01/23, 9:42 AM

## 2023-01-01 NOTE — Progress Notes (Signed)
PROGRESS NOTE    Frederick Perry  RUE:[id number] DOB: 1956-03-20 DOA: 12/22/2022 PCP: Enid Baas, MD  [id number]  LOS: 10 days   Brief hospital course:   Assessment & Plan: [name] is a 67 year old male with heart failure reduced ejection fraction, morbid obesity, atrial fibrillation status post IVC filter placement in 2017 with recurrent DVT this patient is on Eliquis, hyperlipidemia, hypertension, who presents to the emergency department for chief concerns of swelling of bilateral lower extremities.   Acute respiratory failure with hypoxia and hypercapnia CTA chest 7/12 negative for PE 7/11 - VBG obtained due to lethargy - pCO2 88 - bipap ordered by pt refused it 7/12 - mentation improved, pCO2 improved without bipap --Continue supplemental O2 to keep sats >=90%, wean as tolerated  Acute on Chronic HFrEF (heart failure with reduced ejection fraction) (HCC) Appears to be in acute decompensation on admission Echocardiogram 12/22/22 - EF 50-55%, grade II DD, + LV WMA's, mild MR, late systolic MV prolapse BNP significantly elevated 2212 --Cardiology and Nephrology consulted - appreciate mgmt Undergoing diuresis with Lasix drip --Transitioned from Lasix drip >>  IV Lasix 60 mg BID on 7/13--7/14 --Lasix now on hold due to Cr rising --cont Toprol   Right pleural effusion  - may contribute to his hypoxia Thoracentesis attempted (7/15) but no large enough fluid pocket was seen to do safely. --s/p diuresis Pulmonology follow up has been arranged with  -- 01/20/23 @ 1:30    Hematuria - noted on 7/16, dark red urine in pure-wick canister Pt has history of kidney stones.  Denies dysuria.  Has mild flank pain. --Hgb stable --hold Eliquis   New Onset A-fib with RVR --Cardiology following - appreciate management --cont Toprol (new) --hold Eliquis due to hematuria   Hypokalemia  --monitor and replete PRN   Hypomagnesemia  --monitor and replete PRN    Acute metabolic encephalopathy - due to hypercapnic respiratory failure.  Improved.   Elevated troponin due to demand ischemia in setting of decompensated CHF No chest pain.   Acute kidney injury - improving Due to cardiorenal syndrome in setting of CHF decompensation Patient presented with creatinine 2.47 however last creatinine was 1.11-year ago --Diuretics held 7/14 with Cr rise --Nephrology  consulted   History of pulmonary embolism [facility]) Patient endorses that his IVC filter from 2017 has never been removed Patient endorses compliance with home Eliquis, CTA chest 7/12 negative for PE Doppler of lower extremity did not show any evidence of DVT --Transitioned from heparin drip >> [name] --Eliquis held starting 7/17 due to hematuria, pt agreeable and said that his blood clot happened a long time ago.   Coronary artery disease Continue atorvastatin and ASA   Essential hypertension --cont hydralazine, Imdur and Toprol   Hyperlipidemia --cont statin   Obesity Body mass index is 36.46 kg/m. Complicates overall care and prognosis.   Recommend lifestyle modifications including physical activity and diet for weight loss and overall long-term health.     DVT prophylaxis: SCD/Compression stockings Code Status: Full code  Family Communication:  Level of care: Telemetry Cardiac Dispo:   The patient is from: Truck Anticipated d/c is to: undetermined   Subjective and Interval History:  Pt reported doing fine.  No dyspnea.   Objective: Vitals:   01/01/23 0302 01/01/23 0825 01/01/23 1244 01/01/23 1737  BP: 125/81 114/73 114/71 120/70  Pulse: 68 69 (!) 58 67  Resp: 17 16 16 16   Temp: 97.6 F (36.4 C) 97.9 F (36.6 C) 98.4 F (36.9 C)  98 F (36.7 C)  TempSrc:      SpO2: 99% 96% 97% 100%  Weight:      Height:        Intake/Output Summary (Last 24 hours) at 01/01/2023 1831 Last data filed at 01/01/2023 1805 Gross per 24 hour  Intake 240 ml  Output 870 ml  Net -630  ml   Filed Weights   12/22/22 1900  Weight: 127.1 kg    Examination:   Constitutional: NAD, AAOx3 HEENT: conjunctivae and lids normal, EOMI CV: No cyanosis.   RESP: normal respiratory effort Neuro: II - XII grossly intact.   Psych: Normal mood and affect.  Appropriate judgement and reason Urine less bloody    Data Reviewed: I have personally reviewed labs and imaging studies  Time spent: 35 minutes  Darlin Priestly, MD [facility] If 7PM-7AM, please contact night-coverage 01/01/2023, 6:31 PM

## 2023-01-01 NOTE — TOC Progression Note (Signed)
Transition of Care [facility]) - Progression Note    Patient Details  Name: Frederick Perry MRN: [id number] Date of Birth: 15-May-1956  Transition of Care [facility]) CM/SW Contact  Truddie Hidden, RN Phone Number: 01/01/2023, 4:10 PM  Clinical Narrative:    TOC assessing for ongoing needs and discharge planning.   Expected Discharge Plan:  (TBD) Barriers to Discharge: Continued Medical Work up  Expected Discharge Plan and Services     Post Acute Care Choice:  (TBD) Living arrangements for the past 2 months:  (Truck)                                       Social Determinants of Health (SDOH) Interventions SDOH Screenings   Food Insecurity: No Food Insecurity (12/23/2022)  Housing: Low Risk  (12/23/2022)  Transportation Needs: No Transportation Needs (12/23/2022)  Utilities: Not At Risk (12/23/2022)  Financial Resource Strain: Low Risk  (12/22/2022)   Received from [facility], [facility]  Physical Activity: Unknown (02/04/2018)  Social Connections: Unknown (02/04/2018)  Tobacco Use: High Risk (12/23/2022)    Readmission Risk Interventions     No data to display

## 2023-01-02 ENCOUNTER — Other Ambulatory Visit: Payer: Self-pay | Admitting: *Deleted

## 2023-01-02 DIAGNOSIS — J9601 Acute respiratory failure with hypoxia: Secondary | ICD-10-CM | POA: Diagnosis not present

## 2023-01-02 DIAGNOSIS — I5020 Unspecified systolic (congestive) heart failure: Secondary | ICD-10-CM

## 2023-01-02 LAB — BASIC METABOLIC PANEL
Anion gap: 8 (ref 5–15)
BUN: 33 mg/dL — ABNORMAL HIGH (ref 8–23)
CO2: 33 mmol/L — ABNORMAL HIGH (ref 22–32)
Calcium: 8.9 mg/dL (ref 8.9–10.3)
Chloride: 99 mmol/L (ref 98–111)
Creatinine, Ser: 1.21 mg/dL (ref 0.61–1.24)
GFR, Estimated: >60 mL/min (ref >60)
Glucose, Bld: 142 mg/dL — ABNORMAL HIGH (ref 70–99)
Potassium: 3.4 mmol/L — ABNORMAL LOW (ref 3.5–5.1)
Sodium: 140 mmol/L (ref 135–145)

## 2023-01-02 LAB — CBC
HCT: 39 % (ref 39.0–52.0)
Hemoglobin: 12.5 g/dL — ABNORMAL LOW (ref 13.0–17.0)
MCH: 31.2 pg (ref 26.0–34.0)
MCHC: 32.1 g/dL (ref 30.0–36.0)
MCV: 97.3 fL (ref 80.0–100.0)
Platelets: 203 10*3/uL (ref 150–400)
RBC: 4.01 MIL/uL — ABNORMAL LOW (ref 4.22–5.81)
RDW: 16.5 % — ABNORMAL HIGH (ref 11.5–15.5)
WBC: 7.1 10*3/uL (ref 4.0–10.5)
nRBC: 0 % (ref 0.0–0.2)

## 2023-01-02 LAB — MAGNESIUM: Magnesium: 2.2 mg/dL (ref 1.7–2.4)

## 2023-01-02 MED ORDER — HYDRALAZINE HCL 25 MG PO TABS: 25.0000 mg | ORAL_TABLET | Freq: Three times a day (TID) | ORAL | 2 refills | Status: DC

## 2023-01-02 MED ORDER — METOPROLOL SUCCINATE ER 50 MG PO TB24: 50.0000 mg | ORAL_TABLET | Freq: Every day | ORAL | 2 refills | Status: DC

## 2023-01-02 MED ORDER — ISOSORBIDE MONONITRATE ER 30 MG PO TB24: 30.0000 mg | ORAL_TABLET | Freq: Every day | ORAL | 2 refills | Status: DC

## 2023-01-02 MED ORDER — APIXABAN 5 MG PO TABS: ORAL_TABLET | ORAL | Status: DC

## 2023-01-02 MED ORDER — TORSEMIDE 20 MG PO TABS: 20.0000 mg | ORAL_TABLET | Freq: Every day | ORAL | 2 refills | Status: DC

## 2023-01-02 MED ORDER — POTASSIUM CHLORIDE CRYS ER 20 MEQ PO TBCR
EXTENDED_RELEASE_TABLET | ORAL | Status: AC
  Filled 2023-01-02: qty 2

## 2023-01-02 MED ORDER — POTASSIUM CHLORIDE CRYS ER 20 MEQ PO TBCR
40.0000 meq | EXTENDED_RELEASE_TABLET | Freq: Once | ORAL | Status: AC
  Administered 2023-01-02: 40 meq via ORAL

## 2023-01-02 MED ORDER — HYDRALAZINE HCL 25 MG PO TABS: 25.0000 mg | ORAL_TABLET | Freq: Three times a day (TID) | ORAL | Status: DC

## 2023-01-02 MED ORDER — TORSEMIDE 20 MG PO TABS
ORAL_TABLET | ORAL | Status: AC
  Filled 2023-01-02: qty 1

## 2023-01-02 MED ORDER — TORSEMIDE 20 MG PO TABS
20.0000 mg | ORAL_TABLET | Freq: Every day | ORAL | Status: DC
  Administered 2023-01-02: 20 mg via ORAL

## 2023-01-02 NOTE — Consult Note (Signed)
[facility] Customer service manager [facility]) Accountable Care Organization (ACO) [facility] Liaison Note  01/02/2023       Frederick Perry [birth year] [id number]  Location: [place] Liaison screened the patient remotely at [facility].  Insurance:  Medicare   [name] is a 67 y.o. male who is a Primary Care Patient of Enid Baas, MD [facility]). The patient was screened for readmission hospitalization with noted medium risk score for unplanned readmission risk with 1 IP in 6 months.  The patient was assessed for potential [facility]) Care Management service needs for post hospital transition for care coordination. Review of patient's electronic medical record reveals patient was admitted for Acute CHF. Spoke with pt today concerning care management services and offered a referral with a nurse care coordinator to further assist with managing his ongoing CHF. Pt receptive to a referral for services and will be discharged with his son as his caregiver. Pt verified he declined [facility] at this time. Stress the importance of self care related to daily weights for his HF and when to address acute symptoms related to the HF zones related to fluid overload (pt verbalized an understanding).   Plan: [facility] Liaison will continue to follow progress and disposition to asess for post hospital community care coordination/management needs.  Referral request for community care coordination: pending disposition.   [facility] Care Management/Population Health does not replace or interfere with any arrangements made by the Inpatient Transition of Care team.   For questions contact:   Elliot Cousin, RN, [facility] Liaison [place]   Population Health Office Hours MTWF  8:00 am-6:00 pm Off on Thursday [phone] mobile [phone] [Office toll free line] Office Hours are M-F 8:30 - 5 pm 24 hour nurse advise line [phone] Concierge   [email or web address]

## 2023-01-02 NOTE — Progress Notes (Signed)
Physical Therapy Treatment Patient Details Name: Frederick Perry MRN: [id number] DOB: [birth year] Today's Date: 01/02/2023   History of Present Illness Patient is a 67 year old male with heart failure reduced ejection fraction, morbid obesity, atrial fibrillation status post IVC filter placement in 2017 with recurrent DVT this patient is on Eliquis, hyperlipidemia, hypertension, who presents to the emergency department for chief concerns of swelling of bilateral lower extremities.    PT Comments  Patient in bed, agreeable to get up to the chair. Increased time required for all mobility tasks. Sp02 initially 87% on room air after sitting up, increasing quickly to 90% with cues for breathing techniques. Min guard assistance provided with cues for safety using rolling walker for stand step transfer from bed to chair. Patient mildly fatigued with activity. Recommend to continue PT to maximize independence and decrease caregiver burden.     Assistance Recommended at Discharge Intermittent Supervision/Assistance  If plan is discharge home, recommend the following:  Can travel by private vehicle    A little help with walking and/or transfers;A little help with bathing/dressing/bathroom;Assistance with cooking/housework;Assist for transportation;Help with stairs or ramp for entrance      Equipment Recommendations  Rolling walker (2 wheels)    Recommendations for Other Services       Precautions / Restrictions Precautions Precautions: Fall Restrictions Weight Bearing Restrictions: No     Mobility  Bed Mobility Overal bed mobility: Needs Assistance Bed Mobility: Supine to Sit     Supine to sit: HOB elevated, Min guard     General bed mobility comments: increased time required to complete tasks. cues for task initiation. short rest break required after sitting upright before progressing to standing.    Transfers Overall transfer level: Needs assistance Equipment used: Rolling  walker (2 wheels) Transfers: Sit to/from Stand, Bed to chair/wheelchair/BSC     Step pivot transfers: Min guard, From elevated surface       General transfer comment: cues for safety    Ambulation/Gait               General Gait Details: patient declined wanting to walk at this time   Stairs             Wheelchair Mobility     Tilt Bed    Modified Rankin (Stroke Patients Only)       Balance Overall balance assessment: Needs assistance Sitting-balance support: Feet supported Sitting balance-Leahy Scale: Good     Standing balance support: Bilateral upper extremity supported, During functional activity, Reliant on assistive device for balance Standing balance-Leahy Scale: Fair Standing balance comment: with RW for UE support in standing. no dizziness reported                            Cognition Arousal/Alertness: Awake/alert Behavior During Therapy: WFL for tasks assessed/performed Overall Cognitive Status: Within Functional Limits for tasks assessed                                          Exercises      General Comments General comments (skin integrity, edema, etc.): patient on room air on arrival to room. Sp02 87% initially with sitting upright and increased to 90% with cues for breathing techniques      Pertinent Vitals/Pain Pain Assessment Pain Assessment: No/denies pain    Home Living  Prior Function            PT Goals (current goals can now be found in the care plan section) Acute Rehab PT Goals Patient Stated Goal: to return to prior level of function PT Goal Formulation: With patient Time For Goal Achievement: 01/08/23 Potential to Achieve Goals: Good Progress towards PT goals: Progressing toward goals    Frequency    Min 1X/week      PT Plan Current plan remains appropriate    Co-evaluation              AM-PAC PT "6 Clicks" Mobility   Outcome  Measure  Help needed turning from your back to your side while in a flat bed without using bedrails?: A Little Help needed moving from lying on your back to sitting on the side of a flat bed without using bedrails?: A Little Help needed moving to and from a bed to a chair (including a wheelchair)?: A Little Help needed standing up from a chair using your arms (e.g., wheelchair or bedside chair)?: A Little Help needed to walk in hospital room?: A Little Help needed climbing 3-5 steps with a railing? : A Little 6 Click Score: 18    End of Session   Activity Tolerance: Patient tolerated treatment well Patient left: in chair;with call bell/phone within reach;with chair alarm set Nurse Communication: Mobility status PT Visit Diagnosis: Muscle weakness (generalized) (M62.81);Unsteadiness on feet (R26.81);Difficulty in walking, not elsewhere classified (R26.2)     Time: 0912-0926 PT Time Calculation (min) (ACUTE ONLY): 14 min  Charges:    $Therapeutic Activity: 8-22 mins PT General Charges $$ ACUTE PT VISIT: 1 Visit                    Donna Bernard, PT, MPT  Ina Homes 01/02/2023, 9:48 AM

## 2023-01-02 NOTE — TOC Transition Note (Signed)
Transition of Care [facility]) - CM/SW Discharge Note   Patient Details  Name: Frederick Perry MRN: [id number] Date of Birth: 11/29/1955  Transition [facility]) CM/SW Contact:  Truddie Hidden, RN Phone Number: 01/02/2023, 12:41 PM   Clinical Narrative:    Spoke with patient regarding discharge plans for today. He has declined to receive HH. MD notified. TOC signing off.        Barriers to Discharge: Continued Medical Work up   Patient Goals and CMS Choice CMS Medicare.gov Compare Post Acute Care list provided to:: Patient    Discharge Placement                         Discharge Plan and Services Additional resources added to the After Visit Summary for       Post Acute Care Choice:  (TBD)                               Social Determinants of Health (SDOH) Interventions SDOH Screenings   Food Insecurity: No Food Insecurity (12/23/2022)  Housing: Low Risk  (12/23/2022)  Transportation Needs: No Transportation Needs (12/23/2022)  Utilities: Not At Risk (12/23/2022)  Financial Resource Strain: Low Risk  (12/22/2022)   [facility], [facility]  Physical Activity: Unknown (02/04/2018)  Social Connections: Unknown (02/04/2018)  Tobacco Use: High Risk (12/23/2022)     Readmission Risk Interventions     No data to display

## 2023-01-02 NOTE — Discharge Summary (Signed)
Physician Discharge Summary   LAMORRIS KNOBLOCK  male DOB: July 24, 1955  [name]:[id number]  PCP: Enid Baas, MD  Admit date: 12/22/2022 Discharge date: 01/02/2023  Admitted From: home Disposition:  home CODE STATUS: Full code  Discharge Instructions     Diet - low sodium heart healthy   Complete by: As directed    Discharge instructions   Complete by: As directed    Because of the blood in your urine, your Eliquis is held, pending followup with your outpatient urologist.  Please stop Coreg and start Toprol.  Stop Isordil and start Imdur.  Hydralazine is reduced from 37.5 mg to 25 mg three times a day.  Continue taking your home torsemide. [facility] Course:  For full details, please see H&P, progress notes, consult notes and ancillary notes.  Briefly,  Mr. Kamonte Mcmichen is a 67 year old male with heart failure reduced ejection fraction, morbid obesity, atrial fibrillation status post IVC filter placement in 2017 with recurrent DVT on Eliquis, hypertension, who presented to the emergency department for chief concerns of swelling of bilateral lower extremities.    Acute respiratory failure with hypoxia and hypercapnia 7/11 - VBG obtained due to lethargy - pCO2 88 - bipap ordered by pt refused it 7/12 - mentation improved, pCO2 improved without bipap.  CTA chest negative for PE --Pt was weaned down to room air prior to discharge.   Acute on Chronic HFrEF (heart failure with reduced ejection fraction) (HCC) Appears to be in acute decompensation on admission Echocardiogram 12/22/22 - EF 50-55%, grade II DD, + LV WMA's, mild MR, late systolic MV prolapse BNP significantly elevated 2212 --Cardiology and Nephrology consulted --received diuresis with Lasix drip --Transitioned from Lasix drip >>  IV Lasix 60 mg BID on 7/13--7/14 --Lasix then on hold due to Cr rising --pt discharged on home torsemide. --home coreg switched to Toprol, home Isordil switched to Imdur.      Right pleural effusion  - may contribute to his hypoxia Thoracentesis attempted (7/15) but no large enough fluid pocket was seen to do safely. --s/p diuresis Pulmonology follow up has been arranged with [facility] -- 01/20/23 @ 1:30    Hematuria  - noted on 7/16, dark red urine in pure-wick canister. --After stopping Eliquis, hematuria resolved. --due to hx of bladder cancer, urology messaged who scheduled pt for outpatient f/u. --Eliquis held on discharge pending urology f/u.  New Onset A-fib with RVR --Cardiology following --cont Toprol (new) --hold Eliquis due to hematuria   Hypokalemia  --monitored and repleted PRN   Hypomagnesemia  --monitored and repleted PRN   Acute metabolic encephalopathy - due to hypercapnic respiratory failure.  Improved.   Elevated troponin due to demand ischemia in setting of decompensated CHF No chest pain.   Acute kidney injury  Due to cardiorenal syndrome in setting of CHF decompensation Patient presented with creatinine 2.47 however last creatinine was 1.11-year ago --Nephrology  consulted --Cr improved to 1.21 prior to discharge.   History of pulmonary embolism (HCC) Patient endorses that his IVC filter from 2017 has never been removed Patient endorses compliance with home Eliquis, CTA chest 7/12 negative for PE Doppler of lower extremity did not show any evidence of DVT --Eliquis held starting 7/17 due to hematuria, pt agreeable and said that his blood clot happened a long time ago. --Eliquis held on discharge pending urology f/u.   Coronary artery disease Continue atorvastatin and ASA   Essential hypertension --cont hydralazine (reduced), Imdur and Toprol  Hyperlipidemia --cont statin   Obesity Body mass index is 36.46 kg/m. Complicates overall care and prognosis.     Unless noted above, medications under "STOP" list are ones pt was not taking PTA.  Discharge Diagnoses:  Principal Problem:   Acute hypoxemic respiratory  failure (HCC) Active Problems:   Hyperlipidemia   Acute on chronic HFrEF (heart failure with reduced ejection fraction) (HCC)   HFrEF (heart failure with reduced ejection fraction) (HCC)   Essential hypertension   Coronary artery disease   Pulmonary embolism (HCC)   Morbid obesity (HCC)   Elevated troponin   Acute on chronic congestive heart failure (HCC)   Elevated d-dimer   30 Day Unplanned Readmission Risk Score    Flowsheet Row ED to Hosp-Admission (Current) from 12/22/2022 in [facility]U  30 Day Unplanned Readmission Risk Score (%) 19.72 Filed at 01/02/2023 0801       This score is the patient's risk of an unplanned readmission within 30 days of being discharged (0 -100%). The score is based on dignosis, age, lab data, medications, orders, and past utilization.   Low:  0-14.9   Medium: 15-21.9   High: 22-29.9   Extreme: 30 and above         Discharge Instructions:  Allergies as of 01/02/2023   No Known Allergies      Medication List     STOP taking these medications    carvedilol 6.25 MG tablet Commonly known as: COREG   isosorbide dinitrate 20 MG tablet Commonly known as: ISORDIL   nicotine 21 mg/24hr patch Commonly known as: NICODERM CQ - dosed in mg/24 hours       TAKE these medications    apixaban 5 MG Tabs tablet Commonly known as: ELIQUIS Hold until followup with outpatient urology due to blood in your urine. What changed:  how much to take how to take this when to take this additional instructions   aspirin EC 81 MG tablet Take 81 mg by mouth daily.   atorvastatin 40 MG tablet Commonly known as: LIPITOR Take 1 tablet (40 mg total) by mouth daily.   cetirizine 10 MG tablet Commonly known as: ZYRTEC Take 10 mg by mouth daily as needed for allergies.   hydrALAZINE 25 MG tablet Commonly known as: APRESOLINE Take 1 tablet (25 mg total) by mouth 3 (three) times daily. Reduced from 37.5 mg. What changed:  how much  to take additional instructions   isosorbide mononitrate 30 MG 24 hr tablet Commonly known as: IMDUR Take 1 tablet (30 mg total) by mouth daily. Start taking on: January 03, 2023   metoprolol succinate 50 MG 24 hr tablet Commonly known as: TOPROL-XL Take 1 tablet (50 mg total) by mouth daily. Take with or immediately following a meal. Start taking on: January 03, 2023   torsemide 20 MG tablet Commonly known as: DEMADEX Take 1 tablet (20 mg total) by mouth daily.         Follow-up Information     Debbe Odea, MD Follow up in 2 week(s).   Specialties: Cardiology, Radiology Contact information: [address] [place] [address] [phone]         Lamont Dowdy, MD Follow up in 2 week(s).   Specialty: Nephrology Contact information: [address] [place] [address] [phone]         Michiel Cowboy A, PA-C Follow up in 1 week(s).   Specialty: Urology Contact information: [address] [place]  [place] [address] [phone]         Enid Baas, MD Follow up in 1 week(s).   Specialty: Internal Medicine Contact information: [address] [place] [address] [phone]                 No Known Allergies   The results of significant diagnostics from this hospitalization (including imaging, microbiology, ancillary and laboratory) are listed below for reference.   Consultations:   Procedures/Studies: [place] CHEST (PLEURAL EFFUSION)  Result Date: 12/29/2022 INDICATION: Right pleural effusion EXAM: CHEST ULTRASOUND COMPARISON:  CTA chest 12/26/2022 FINDINGS: Small right-sided pleural effusion visualizes. No significant pocket of fluid or percutaneous window to allow safe thoracentesis especially given the motion of the lung visualized to the chest wall. Risks of thoracentesis today outweigh the benefits. IMPRESSION: Small right-sided pleural effusion with no safe window for  percutaneous access. Thoracentesis was not performed today. Read by: Mina Marble, PA-C Electronically Signed   By: Olive Bass M.D.   On: 12/29/2022 12:40   CT Angio Chest Pulmonary Embolism (PE) W or WO Contrast  Result Date: 12/26/2022 CLINICAL DATA:  Pulmonary embolism suspected, high probability. EXAM: CT ANGIOGRAPHY CHEST WITH CONTRAST TECHNIQUE: Multidetector CT imaging of the chest was performed using the standard protocol during bolus administration of intravenous contrast. Multiplanar CT image reconstructions and MIPs were obtained to evaluate the vascular anatomy. RADIATION DOSE REDUCTION: This exam was performed according to the departmental dose-optimization program which includes automated exposure control, adjustment of the mA and/or kV according to patient size and/or use of iterative reconstruction technique. CONTRAST:  75mL OMNIPAQUE IOHEXOL 350 MG/ML SOLN COMPARISON:  V/Q scan 12/23/2022. CT angio of the abdomen and pelvis 12/14/2015 FINDINGS: Cardiovascular: The heart is enlarged. Atherosclerotic calcifications are present at the aortic arch and great vessel origins. No aneurysm or focal stenosis is present. Atherosclerotic calcifications are present in the coronary arteries. Pulmonary artery opacification is excellent. No focal filling defects are present to suggest pulmonary embolus. Mediastinum/Nodes: No enlarged mediastinal, hilar, or axillary lymph nodes. Thyroid gland, trachea, and esophagus demonstrate no significant findings. Lungs/Pleura: A moderate-sized right pleural effusion is present. Associated right lower lobe and posterior right upper lobe atelectasis is present. Minimal atelectasis is present the left base. The lungs are otherwise clear. No pneumothorax is present. Upper Abdomen: Layering densities are present at the neck of the gallbladder without other inflammatory change. An exophytic water density lesion posteriorly from the left kidney has increased somewhat in  size, now measuring 3.3 cm. Musculoskeletal: No chest wall abnormality. No acute or significant osseous findings. Review of the MIP images confirms the above findings. IMPRESSION: 1. No pulmonary embolus. 2. Moderate-sized right pleural effusion with associated right lower lobe and posterior right upper lobe atelectasis. 3. Cardiomegaly without failure. 4. Coronary artery disease. 5. Cholelithiasis without evidence of cholecystitis. 6. Simple cyst of the left kidney as described. Recommend no imaging follow-up. 7.  Aortic Atherosclerosis (ICD10-I70.0). Electronically Signed   By: Marin Roberts M.D.   On: 12/26/2022 10:07   NM Pulmonary Perfusion  Result Date: 12/23/2022 CLINICAL DATA:  Hypoxia, elevated D-dimer, clinical suspicion for PE EXAM: NUCLEAR MEDICINE PERFUSION LUNG SCAN TECHNIQUE: Perfusion images were obtained in multiple projections after intravenous injection of radiopharmaceutical. Ventilation scans intentionally deferred if perfusion scan and chest x-ray adequate for interpretation during COVID 19 epidemic. RADIOPHARMACEUTICALS:  4.25 mCi Tc-11m MAA IV COMPARISON:  Chest radiographs none on 12/22/2022 FINDINGS: There are no discrete wedge-shaped perfusion defects. There are moderate-sized nonsegmental foci off decreased  tracer uptake in the posterior mid and lower lung fields, more so on the left side. Evaluation is limited without ventilation images. There are no focal infiltrates in the lung fields in the areas of decreased perfusion. IMPRESSION: There are moderate sized areas of decreased tracer uptake in the posterior mid and lower lung fields, more so on the left side. No discrete wedge-shaped perfusion defects are seen.Study is inconclusive to evaluate for PE. If clinically warranted, follow-up CT pulmonary angiogram may be considered. Electronically Signed   By: Ernie Avena M.D.   On: 12/23/2022 17:43   US RENAL  Result Date: 12/23/2022 CLINICAL DATA:  Acute kidney failure  EXAM: RENAL / URINARY TRACT ULTRASOUND COMPLETE COMPARISON:  CT angiogram abdomen and pelvis December 14, 2015 FINDINGS: Right Kidney: Renal measurements: 10.7 x 5.6 x 5.8 cm = volume: 182.0 mL. Echogenicity within normal limits. No mass or hydronephrosis visualized. Left Kidney: Renal measurements: 11.3 x 6.3 x 5.3 cm = volume: 198.1 mL. Echogenicity within normal limits. No hydronephrosis. There is an exophytic benign cyst originating from the mid-pole, measuring 3.4 x 2.3 x 3.1 cm. Bladder: Appears normal for degree of bladder distention. Other: None. IMPRESSION: No acute findings. Electronically Signed   By: Jacob Moores M.D.   On: 12/23/2022 12:21   ECHOCARDIOGRAM COMPLETE  Result Date: 12/23/2022    ECHOCARDIOGRAM REPORT   Patient Name:   Frederick Perry Date of Exam: 12/22/2022 Medical Rec #:  [id number]         Height:       73.5 in Accession #:    [id number]        Weight:       280.2 lb Date of Birth:  10-05-1955        BSA:          2.496 m Patient Age:    66 years          BP:           122/75 mmHg Patient Gender: M                 HR:           69 bpm. Exam Location:  [facility] Procedure: 2D Echo, Cardiac Doppler, Color Doppler and Intracardiac            Opacification Agent Indications:     R06.00 Dyspnea  History:         Patient has prior history of Echocardiogram examinations, most                  recent 01/07/2021. CAD and Previous Myocardial Infarction; Risk                  Factors:Diabetes, Hypertension and Dyslipidemia. Ischemic                  cardiomyopathy. Pulmonary embolism. Chronic kidney disease.  Sonographer:     Daphine Deutscher RDCS Referring Phys:  [id number] AMY N COX Diagnosing Phys: Yvonne Kendall MD IMPRESSIONS  1. Left ventricular ejection fraction, by estimation, is 50 to 55%. The left ventricle has low normal function. The left ventricle demonstrates regional wall motion abnormalities (see scoring diagram/findings for description). Left ventricular diastolic  parameters are  consistent with Grade II diastolic dysfunction (pseudonormalization). Elevated left atrial pressure.  2. Right ventricular systolic function is mildly reduced. The right ventricular size is mildly enlarged. There is mildly elevated pulmonary artery systolic pressure.  3. Left atrial size was mildly dilated.  4. Right atrial size was mildly dilated.  5. The mitral valve is abnormal. Mild mitral valve regurgitation. No evidence of mitral stenosis. There is mild late systolic prolapse of posterior leaflet of the mitral valve. Cannot exclude a ruptured chord.  6. The aortic valve is tricuspid. There is moderate calcification of the aortic valve. There is moderate thickening of the aortic valve. Aortic valve regurgitation is not visualized. Aortic valve sclerosis/calcification is present, without any evidence of aortic stenosis.  7. The inferior vena cava is dilated in size with <50% respiratory variability, suggesting right atrial pressure of 15 mmHg. FINDINGS  Left Ventricle: Left ventricular ejection fraction, by estimation, is 50 to 55%. The left ventricle has low normal function. The left ventricle demonstrates regional wall motion abnormalities. Definity contrast agent was given IV to delineate the left ventricular endocardial borders. The left ventricular internal cavity size was normal in size. There is no left ventricular hypertrophy. Left ventricular diastolic parameters are consistent with Grade II diastolic dysfunction (pseudonormalization). Elevated left atrial pressure.  LV Wall Scoring: The basal inferolateral segment, apical lateral segment, apical anterior segment, and apex are hypokinetic. The anterior wall, antero-lateral wall, entire septum, entire inferior wall, and mid inferolateral segment are normal. Right Ventricle: The right ventricular size is mildly enlarged. No increase in right ventricular wall thickness. Right ventricular systolic function is mildly reduced. There is mildly elevated  pulmonary artery systolic pressure. The tricuspid regurgitant  velocity is 2.41 m/s, and with an assumed right atrial pressure of 15 mmHg, the estimated right ventricular systolic pressure is 38.2 mmHg. Left Atrium: Left atrial size was mildly dilated. Right Atrium: Right atrial size was mildly dilated. Pericardium: The pericardium was not well visualized. Mitral Valve: The mitral valve is abnormal. There is mild late systolic prolapse of posterior leaflet of the mitral valve. There is mild thickening of the mitral valve leaflet(s). Mild mitral valve regurgitation. No evidence of mitral valve stenosis. Tricuspid Valve: The tricuspid valve is normal in structure. Tricuspid valve regurgitation is mild. Aortic Valve: The aortic valve is tricuspid. There is moderate calcification of the aortic valve. There is moderate thickening of the aortic valve. Aortic valve regurgitation is not visualized. Aortic valve sclerosis/calcification is present, without any  evidence of aortic stenosis. Aortic valve mean gradient measures 5.5 mmHg. Aortic valve peak gradient measures 10.1 mmHg. Aortic valve area, by VTI measures 2.97 cm. Pulmonic Valve: The pulmonic valve was normal in structure. Pulmonic valve regurgitation is not visualized. No evidence of pulmonic stenosis. Aorta: The aortic root and ascending aorta are structurally normal, with no evidence of dilitation. Pulmonary Artery: The pulmonary artery is of normal size. Venous: The inferior vena cava is dilated in size with less than 50% respiratory variability, suggesting right atrial pressure of 15 mmHg. IAS/Shunts: The interatrial septum was not well visualized.  LEFT VENTRICLE PLAX 2D LVIDd:         5.40 cm      Diastology LVIDs:         4.00 cm      LV e' medial:    4.75 cm/s LV PW:         1.00 cm      LV E/e' medial:  18.8 LV IVS:        1.00 cm      LV e' lateral:   7.47 cm/s LVOT diam:     2.20 cm      LV E/e' lateral: 12.0 LV SV:  95 LV SV Index:   38 LVOT  Area:     3.80 cm  LV Volumes (MOD) LV vol d, MOD A2C: 118.3 ml LV vol d, MOD A4C: 122.7 ml LV vol s, MOD A2C: 40.5 ml LV vol s, MOD A4C: 64.6 ml LV SV MOD A2C:     77.8 ml LV SV MOD A4C:     122.7 ml LV SV MOD BP:      70.5 ml RIGHT VENTRICLE             IVC RV Basal diam:  4.60 cm     IVC diam: 2.80 cm RV S prime:     10.10 cm/s TAPSE (M-mode): 1.9 cm LEFT ATRIUM              Index        RIGHT ATRIUM           Index LA diam:        5.20 cm  2.08 cm/m   RA Area:     28.00 cm LA Vol (A2C):   109.0 ml 43.67 ml/m  RA Volume:   100.00 ml 40.07 ml/m LA Vol (A4C):   66.2 ml  26.52 ml/m LA Biplane Vol: 87.2 ml  34.94 ml/m  AORTIC VALVE AV Area (Vmax):    3.30 cm AV Area (Vmean):   3.04 cm AV Area (VTI):     2.97 cm AV Vmax:           159.13 cm/s AV Vmean:          110.579 cm/s AV VTI:            0.319 m AV Peak Grad:      10.1 mmHg AV Mean Grad:      5.5 mmHg LVOT Vmax:         138.00 cm/s LVOT Vmean:        88.350 cm/s LVOT VTI:          0.250 m LVOT/AV VTI ratio: 0.78  AORTA Ao Root diam: 3.20 cm Ao Asc diam:  3.40 cm MITRAL VALVE               TRICUSPID VALVE MV Area (PHT): 3.62 cm    TR Peak grad:   23.2 mmHg MV Decel Time: 210 msec    TR Vmax:        241.00 cm/s MV E velocity: 89.50 cm/s MV A velocity: 76.55 cm/s  SHUNTS MV E/A ratio:  1.17        Systemic VTI:  0.25 m                            Systemic Diam: 2.20 cm Yvonne Kendall MD Electronically signed by Yvonne Kendall MD Signature Date/Time: 12/23/2022/10:30:45 AM    Final    US Venous Img Lower Bilateral (DVT)  Result Date: 12/23/2022 CLINICAL DATA:  Leg swelling EXAM: BILATERAL LOWER EXTREMITY VENOUS DOPPLER ULTRASOUND TECHNIQUE: Gray-scale sonography with compression, as well as color and duplex ultrasound, were performed to evaluate the deep venous system(s) from the level of the common femoral vein through the popliteal and proximal calf veins. COMPARISON:  07/12/2019 FINDINGS: VENOUS Normal compressibility of the common femoral,  superficial femoral, and popliteal veins, as well as the visualized calf veins. Visualized portions of profunda femoral vein and great saphenous vein unremarkable. No filling defects to suggest DVT on grayscale or color Doppler imaging. Doppler waveforms show normal direction  of venous flow, normal respiratory plasticity and response to augmentation. OTHER None. Limitations: none IMPRESSION: Negative. Electronically Signed   By: Charlett Nose M.D.   On: 12/23/2022 01:35   DG Chest 2 View  Result Date: 12/22/2022 CLINICAL DATA:  Shortness of breath EXAM: CHEST - 2 VIEW COMPARISON:  Chest x-ray January 01, 2021 FINDINGS: Unchanged cardiomegaly and mediastinal contours. No focal pulmonary opacity. No pleural effusion or pneumothorax. The visualized upper abdomen is unremarkable. No acute osseous abnormality. IMPRESSION: 1. No acute pulmonary abnormality. 2. Unchanged cardiomegaly. Electronically Signed   By: Jacob Moores M.D.   On: 12/22/2022 16:50      Labs: BNP (last 3 results) Recent Labs    12/22/22 1620  BNP 2,212.1*   Basic Metabolic Panel: Recent Labs  Lab 12/28/22 0434 12/29/22 0424 12/30/22 0434 12/31/22 0407 01/01/23 0425 01/02/23 0844  NA 138 137 137 137 139 140  K 2.9* 3.6 3.1* 3.4* 3.9 3.4*  CL 83* 88* 90* 94* 98 99  CO2 43* 40* 37* 34* 34* 33*  GLUCOSE 103* 155* 120* 131* 157* 142*  BUN 35* 37* 39* 38* 42* 33*  CREATININE 1.47* 1.53* 1.30* 1.29* 1.40* 1.21  CALCIUM 8.8* 8.9 8.7* 8.8* 8.8* 8.9  MG 1.7 2.2  --  2.3 2.6* 2.2   Liver Function Tests: No results for input(s): "AST", "ALT", "ALKPHOS", "BILITOT", "PROT", "ALBUMIN" in the last 168 hours. No results for input(s): "LIPASE", "AMYLASE" in the last 168 hours. No results for input(s): "AMMONIA" in the last 168 hours. CBC: Recent Labs  Lab 12/27/22 0451 12/31/22 1032 01/01/23 0425 01/02/23 0844  WBC 9.3 6.5 6.9 7.1  HGB 14.0 14.1 12.9* 12.5*  HCT 43.1 46.0 42.0 39.0  MCV 94.9 101.3* 101.7* 97.3  PLT 197 185  183 203   Cardiac Enzymes: No results for input(s): "CKTOTAL", "CKMB", "CKMBINDEX", "TROPONINI" in the last 168 hours. BNP: Invalid input(s): "POCBNP" CBG: No results for input(s): "GLUCAP" in the last 168 hours. D-Dimer No results for input(s): "DDIMER" in the last 72 hours. Hgb A1c No results for input(s): "HGBA1C" in the last 72 hours. Lipid Profile No results for input(s): "CHOL", "HDL", "LDLCALC", "TRIG", "CHOLHDL", "LDLDIRECT" in the last 72 hours. Thyroid function studies No results for input(s): "TSH", "T4TOTAL", "T3FREE", "THYROIDAB" in the last 72 hours.  Invalid input(s): "FREET3" Anemia work up No results for input(s): "VITAMINB12", "FOLATE", "FERRITIN", "TIBC", "IRON", "RETICCTPCT" in the last 72 hours. Urinalysis    Component Value Date/Time   COLORURINE RED (A) 12/30/2022 1700   APPEARANCEUR TURBID (A) 12/30/2022 1700   APPEARANCEUR Cloudy (A) 05/13/2016 1000   LABSPEC  12/30/2022 1700    TEST NOT REPORTED DUE TO COLOR INTERFERENCE OF URINE PIGMENT   PHURINE  12/30/2022 1700    TEST NOT REPORTED DUE TO COLOR INTERFERENCE OF URINE PIGMENT   GLUCOSEU (A) 12/30/2022 1700    TEST NOT REPORTED DUE TO COLOR INTERFERENCE OF URINE PIGMENT   HGBUR (A) 12/30/2022 1700    TEST NOT REPORTED DUE TO COLOR INTERFERENCE OF URINE PIGMENT   BILIRUBINUR (A) 12/30/2022 1700    TEST NOT REPORTED DUE TO COLOR INTERFERENCE OF URINE PIGMENT   BILIRUBINUR Negative 05/13/2016 1000   KETONESUR (A) 12/30/2022 1700    TEST NOT REPORTED DUE TO COLOR INTERFERENCE OF URINE PIGMENT   PROTEINUR (A) 12/30/2022 1700    TEST NOT REPORTED DUE TO COLOR INTERFERENCE OF URINE PIGMENT   NITRITE (A) 12/30/2022 1700    TEST NOT REPORTED DUE TO COLOR INTERFERENCE OF  URINE PIGMENT   LEUKOCYTESUR (A) 12/30/2022 1700    TEST NOT REPORTED DUE TO COLOR INTERFERENCE OF URINE PIGMENT   Sepsis Labs Recent Labs  Lab 12/27/22 0451 12/31/22 1032 01/01/23 0425 01/02/23 0844  WBC 9.3 6.5 6.9 7.1    Microbiology No results found for this or any previous visit (from the past 240 hour(s)).   Total time spend on discharging this patient, including the last patient exam, discussing the hospital stay, instructions for ongoing care as it relates to all pertinent caregivers, as well as preparing the medical discharge records, prescriptions, and/or referrals as applicable, is 35 minutes.    Darlin Priestly, MD  [facility] 01/02/2023, 11:46 AM

## 2023-01-05 ENCOUNTER — Telehealth: Payer: Self-pay | Admitting: *Deleted

## 2023-01-05 NOTE — Progress Notes (Signed)
  Care Coordination  Outreach Note  01/05/2023 Name: Frederick Perry MRN: [id number] DOB: 1955/09/17   Care Coordination Outreach Attempts: An unsuccessful telephone outreach was attempted today to offer the patient information about available care coordination services.  Follow Up Plan:  Additional outreach attempts will be made to offer the patient care coordination information and services.   Encounter Outcome:  No Answer  Burman Nieves, CCMA Care Coordination Care Guide Direct Dial: [phone]

## 2023-01-06 ENCOUNTER — Telehealth: Payer: Self-pay | Admitting: Family

## 2023-01-06 ENCOUNTER — Encounter: Payer: Self-pay | Admitting: Family

## 2023-01-06 NOTE — Progress Notes (Unsigned)
  Care Coordination  Outreach Note  01/06/2023 Name: Frederick Perry MRN: [id number] DOB: [birth year]   Care Coordination Outreach Attempts: A second unsuccessful outreach was attempted today to offer the patient with information about available care coordination services.  Follow Up Plan:  Additional outreach attempts will be made to offer the patient care coordination information and services.   Encounter Outcome:  No Answer  Burman Nieves, CCMA Care Coordination Care Guide Direct Dial: [phone]

## 2023-01-06 NOTE — Progress Notes (Deleted)
Advanced Heart Failure Clinic Note   Referring Physician: PCP: Enid Baas, MD PCP-Cardiologist: Debbe Odea, MD   HPI:  Mr Frederick Perry is a 67 y/o male with a history of DM, HTN, MI, pulmonary embolus, current tobacco use and chronic heart failure.   Echo report from 09/05/15 reviewed and showed an EF of 50-55% along with mildly elevated PA pressure and moderate MR.   Cardiac catheterization done 09/05/15 but unable to view results.   Was in the ED 07/16/19 due to left sided nosebleed where he was treated and released. Was in the ED 07/02/19 due to pedal edema. IV lasix given. 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   He presents today for a follow-up visit with a chief complaint of pedal edema. He describes this as having been present for severel months although it has been improving since the last time he was here. He has associated abdominal distention and chronic difficulty sleeping along with this. He denies any palpitations, chest pain, wheezing, dizziness, shortness of breath, cough or weight gain.   Review of Systems: [y] = yes, [ ]  = no   General: Weight gain [ ] ; Weight loss [ ] ; Anorexia [ ] ; Fatigue [ ] ; Fever [ ] ; Chills [ ] ; Weakness [ ]   Cardiac: Chest pain/pressure [ ] ; Resting SOB [ ] ; Exertional SOB [ ] ; Orthopnea [ ] ; Pedal Edema [ ] ; Palpitations [ ] ; Syncope [ ] ; Presyncope [ ] ; Paroxysmal nocturnal dyspnea[ ]   Pulmonary: Cough [ ] ; Wheezing[ ] ; Hemoptysis[ ] ; Sputum [ ] ; Snoring [ ]   GI: Vomiting[ ] ; Dysphagia[ ] ; Melena[ ] ; Hematochezia [ ] ; Heartburn[ ] ; Abdominal pain [ ] ; Constipation [ ] ; Diarrhea [ ] ; BRBPR [ ]   GU: Hematuria[ ] ; Dysuria [ ] ; Nocturia[ ]   Vascular: Pain in legs with walking [ ] ; Pain in feet with lying flat [ ] ; Non-healing sores [ ] ; Stroke [ ] ; TIA [ ] ; Slurred speech [ ] ;  Neuro: Headaches[ ] ; Vertigo[ ] ; Seizures[ ] ; Paresthesias[  ];Blurred vision [ ] ; Diplopia [ ] ; Vision changes [ ]   Ortho/Skin: Arthritis [ ] ; Joint pain [ ] ; Muscle pain [ ] ; Joint swelling [ ] ; Back Pain [ ] ; Rash [ ]   Psych: Depression[ ] ; Anxiety[ ]   Heme: Bleeding problems [ ] ; Clotting disorders [ ] ; Anemia [ ]   Endocrine: Diabetes [ ] ; Thyroid dysfunction[ ]    Past Medical History:  Diagnosis Date   Bladder cancer (HCC)    a. 12/2015 s/p resection.   CKD (chronic kidney disease), stage II    Coronary artery disease    a. 2017 Cath: occ OM2, occ PDA (both being filled via collats), mod dzs in LAD->med rx; b. 12/2020 Cath: LM nl, LAD 70p, 4m - dLAD fills via R->L collats. RI 100 CTO - fills via collats from OM1. LCX 10m/d. OM2 100 CTO. RCA 64m, 85d. RPDA 100 - fills via collats from OM3. EF 30-35%->med rx.   HFrEF (heart failure with reduced ejection fraction) (HCC)    a. 08/2015 Echo: EF 50-55%; b. 08/2019 Echo: EF 30-35%; c. 12/2020 Echo: EF 30-35%, GrII DD. Sev apical septal, ant, inf HK. Nl RV fxn. Mild MR. Triv AI. Mild-mod Ao sclerosis.   History of DVT (deep vein thrombosis)    a. 08/2015 - s/p IVC filter; b. 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   Hyperlipidemia LDL goal <70    Hypertension    Not on  medications.stopped since he ran out of medications   IDA (iron deficiency anemia)    Ischemic cardiomyopathy    a. 08/2015 Echo: EF 50-55%; b. 08/2019 Echo: EF 30-35%; c. 12/2020 Echo: EF 30-35%.   Myocardial infarction [facility])    Perforated bowel (HCC)    a. 11/2015 s/p surgical repair.   Polysubstance abuse (HCC)    a. ongoing tobacco and alcohol abuse    Pulmonary embolism (HCC)    Type II diabetes mellitus (HCC)     Current Outpatient Medications  Medication Sig Dispense Refill   apixaban (ELIQUIS) 5 MG TABS tablet Hold until followup with outpatient urology due to blood in your urine.     aspirin EC 81 MG tablet Take 81 mg by mouth daily.     atorvastatin (LIPITOR) 40 MG tablet Take 1 tablet (40 mg total) by mouth daily. 90  tablet 3   cetirizine (ZYRTEC) 10 MG tablet Take 10 mg by mouth daily as needed for allergies.     hydrALAZINE (APRESOLINE) 25 MG tablet Take 1 tablet (25 mg total) by mouth 3 (three) times daily. Reduced from 37.5 mg. 90 tablet 2   isosorbide mononitrate (IMDUR) 30 MG 24 hr tablet Take 1 tablet (30 mg total) by mouth daily. 30 tablet 2   metoprolol succinate (TOPROL-XL) 50 MG 24 hr tablet Take 1 tablet (50 mg total) by mouth daily. Take with or immediately following a meal. 30 tablet 2   torsemide (DEMADEX) 20 MG tablet Take 1 tablet (20 mg total) by mouth daily. 30 tablet 2   No current facility-administered medications for this visit.    No Known Allergies    Social History   Socioeconomic History   Marital status: Divorced    Spouse name: Not on file   Number of children: Not on file   Years of education: Not on file   Highest education level: Not on file  Occupational History   Not on file  Tobacco Use   Smoking status: Every Day    Current packs/day: 0.50    Average packs/day: 0.5 packs/day for 40.0 years (20.0 ttl pk-yrs)    Types: Cigarettes   Smokeless tobacco: Never  Vaping Use   Vaping status: Never Used  Substance and Sexual Activity   Alcohol use: Yes    Alcohol/week: 10.0 standard drinks of alcohol    Types: 10 Cans of beer per week    Comment: weekly- beers and occasionally liquor   Drug use: No   Sexual activity: Not on file  Other Topics Concern   Not on file  Social History Narrative   Lives at home with Mother and is his Mothers primary caregiver   Social Determinants of Health   Financial Resource Strain: Low Risk  (12/22/2022)   [facility] from [facility], [facility]   Overall Financial Resource Strain (CARDIA)    Difficulty of Paying Living Expenses: Not hard at all  Food Insecurity: No Food Insecurity (12/23/2022)   Hunger Vital Sign    Worried About Running Out of Food in the Last Year: Never true    Ran Out  of Food in the Last Year: Never true  Transportation Needs: No Transportation Needs (12/23/2022)   PRAPARE - Administrator, Civil Service (Medical): No    Lack of Transportation (Non-Medical): No  Physical Activity: Unknown (02/04/2018)   Exercise Vital Sign    Days of Exercise per Week: Patient declined    Minutes of Exercise  per Session: Patient declined  Stress: Not on file  Social Connections: Unknown (02/04/2018)   Social Connection and Isolation Panel [NHANES]    Frequency of Communication with Friends and Family: Patient declined    Frequency of Social Gatherings with Friends and Family: Patient declined    Attends Religious Services: Patient declined    Database administrator or Organizations: Patient declined    Attends Banker Meetings: Patient declined    Marital Status: Patient declined  Intimate Partner Violence: Not At Risk (12/23/2022)   Humiliation, Afraid, Rape, and Kick questionnaire    Fear of Current or Ex-Partner: No    Emotionally Abused: No    Physically Abused: No    Sexually Abused: No      Family History  Problem Relation Age of Onset   Congestive Heart Failure Mother    Peripheral vascular disease Father        PHYSICAL EXAM: General:  Well appearing. No respiratory difficulty HEENT: normal Neck: supple. no JVD. Carotids 2+ bilat; no bruits. No lymphadenopathy or thyromegaly appreciated. Cor: PMI nondisplaced. Regular rate & rhythm. No rubs, gallops or murmurs. Lungs: clear Abdomen: soft, nontender, nondistended. No hepatosplenomegaly. No bruits or masses. Good bowel sounds. Extremities: no cyanosis, clubbing, rash, edema Neuro: alert & oriented x 3, cranial nerves grossly intact. moves all 4 extremities w/o difficulty. Affect pleasant.  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 - saw cardiology Agbar-Etang 07/22/19 - BNP 07/12/19 was 720.0 - missed echo earlier today as he didn't get a reminder call; # given to patient so that he can call when it works for his schedule  2: HTN- - BP looks good today - BMP 07/21/19 reviewed and showed sodium 137, potassium 4.4, creatinine 2.02 and GFR 40  3: Tobacco use- - smokes 1/2 ppd of cigarettes - drinks beer but unable to quantify but says that it's "a couple" especially when watching ball games  4: Lymphedema- - to get compression socks and apply them daily with removal at bedtime - elevate legs when sitting for long periods of time - 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   Return in 2 months or sooner for any questions/problems before then.    Delma Freeze, FNP 01/06/23\

## 2023-01-06 NOTE — Telephone Encounter (Signed)
Patient did not show for his initial Heart Failure Clinic appointment on 01/06/23

## 2023-01-07 NOTE — Progress Notes (Signed)
  Care Coordination  Outreach Note  01/07/2023 Name: Frederick Perry MRN: [id number] DOB: Jan 06, 1956   Care Coordination Outreach Attempts: A third unsuccessful outreach was attempted today to offer the patient with information about available care coordination services.  Follow Up Plan:  No further outreach attempts will be made at this time. We have been unable to contact the patient to offer or enroll patient in care coordination services  Encounter Outcome:  No Answer  Burman Nieves, [facility] Care Coordination Care Guide Direct Dial: [phone]

## 2023-01-20 ENCOUNTER — Institutional Professional Consult (permissible substitution): Payer: Medicare Other | Admitting: Pulmonary Disease

## 2023-04-22 DIAGNOSIS — I480 Paroxysmal atrial fibrillation: Secondary | ICD-10-CM | POA: Diagnosis present

## 2023-11-12 ENCOUNTER — Encounter: Payer: Self-pay | Admitting: Medical

## 2023-11-17 ENCOUNTER — Inpatient Hospital Stay
Admission: EM | Admit: 2023-11-17 | Discharge: 2023-11-20 | DRG: 291 | Disposition: A | Discharge status: Home / Self Care | Attending: Obstetrics and Gynecology | Admitting: Obstetrics and Gynecology

## 2023-11-17 ENCOUNTER — Emergency Department

## 2023-11-17 ENCOUNTER — Inpatient Hospital Stay

## 2023-11-17 DIAGNOSIS — Z86711 Personal history of pulmonary embolism: Secondary | ICD-10-CM

## 2023-11-17 DIAGNOSIS — E785 Hyperlipidemia, unspecified: Secondary | ICD-10-CM | POA: Diagnosis present

## 2023-11-17 DIAGNOSIS — T501X6A Underdosing of loop [high-ceiling] diuretics, initial encounter: Secondary | ICD-10-CM | POA: Diagnosis present

## 2023-11-17 DIAGNOSIS — E66812 Obesity, class 2: Secondary | ICD-10-CM | POA: Diagnosis present

## 2023-11-17 DIAGNOSIS — Z91138 Patient's unintentional underdosing of medication regimen for other reason: Secondary | ICD-10-CM | POA: Diagnosis not present

## 2023-11-17 DIAGNOSIS — I5033 Acute on chronic diastolic (congestive) heart failure: Secondary | ICD-10-CM | POA: Diagnosis present

## 2023-11-17 DIAGNOSIS — I2510 Atherosclerotic heart disease of native coronary artery without angina pectoris: Secondary | ICD-10-CM | POA: Diagnosis present

## 2023-11-17 DIAGNOSIS — Z7982 Long term (current) use of aspirin: Secondary | ICD-10-CM

## 2023-11-17 DIAGNOSIS — Z7901 Long term (current) use of anticoagulants: Secondary | ICD-10-CM

## 2023-11-17 DIAGNOSIS — Z6836 Body mass index (BMI) 36.0-36.9, adult: Secondary | ICD-10-CM

## 2023-11-17 DIAGNOSIS — I252 Old myocardial infarction: Secondary | ICD-10-CM

## 2023-11-17 DIAGNOSIS — I2699 Other pulmonary embolism without acute cor pulmonale: Secondary | ICD-10-CM | POA: Diagnosis present

## 2023-11-17 DIAGNOSIS — Z720 Tobacco use: Secondary | ICD-10-CM | POA: Diagnosis present

## 2023-11-17 DIAGNOSIS — I255 Ischemic cardiomyopathy: Secondary | ICD-10-CM | POA: Diagnosis present

## 2023-11-17 DIAGNOSIS — J9601 Acute respiratory failure with hypoxia: Secondary | ICD-10-CM | POA: Diagnosis present

## 2023-11-17 DIAGNOSIS — N1831 Chronic kidney disease, stage 3a: Secondary | ICD-10-CM | POA: Diagnosis present

## 2023-11-17 DIAGNOSIS — Z86718 Personal history of other venous thrombosis and embolism: Secondary | ICD-10-CM

## 2023-11-17 DIAGNOSIS — I480 Paroxysmal atrial fibrillation: Secondary | ICD-10-CM | POA: Diagnosis present

## 2023-11-17 DIAGNOSIS — I130 Hypertensive heart and chronic kidney disease with heart failure and stage 1 through stage 4 chronic kidney disease, or unspecified chronic kidney disease: Principal | ICD-10-CM | POA: Diagnosis present

## 2023-11-17 DIAGNOSIS — Z95828 Presence of other vascular implants and grafts: Secondary | ICD-10-CM

## 2023-11-17 DIAGNOSIS — E669 Obesity, unspecified: Secondary | ICD-10-CM | POA: Diagnosis present

## 2023-11-17 DIAGNOSIS — F17210 Nicotine dependence, cigarettes, uncomplicated: Secondary | ICD-10-CM | POA: Diagnosis present

## 2023-11-17 DIAGNOSIS — I509 Heart failure, unspecified: Principal | ICD-10-CM

## 2023-11-17 DIAGNOSIS — E1122 Type 2 diabetes mellitus with diabetic chronic kidney disease: Secondary | ICD-10-CM | POA: Diagnosis present

## 2023-11-17 DIAGNOSIS — R319 Hematuria, unspecified: Secondary | ICD-10-CM | POA: Diagnosis present

## 2023-11-17 DIAGNOSIS — Z5982 Transportation insecurity: Secondary | ICD-10-CM

## 2023-11-17 DIAGNOSIS — T45516A Underdosing of anticoagulants, initial encounter: Secondary | ICD-10-CM | POA: Diagnosis present

## 2023-11-17 DIAGNOSIS — I82409 Acute embolism and thrombosis of unspecified deep veins of unspecified lower extremity: Secondary | ICD-10-CM | POA: Diagnosis present

## 2023-11-17 DIAGNOSIS — E1129 Type 2 diabetes mellitus with other diabetic kidney complication: Secondary | ICD-10-CM | POA: Diagnosis present

## 2023-11-17 DIAGNOSIS — C679 Malignant neoplasm of bladder, unspecified: Secondary | ICD-10-CM | POA: Diagnosis present

## 2023-11-17 DIAGNOSIS — F17200 Nicotine dependence, unspecified, uncomplicated: Secondary | ICD-10-CM | POA: Diagnosis present

## 2023-11-17 DIAGNOSIS — Z5941 Food insecurity: Secondary | ICD-10-CM | POA: Diagnosis not present

## 2023-11-17 DIAGNOSIS — I10 Essential (primary) hypertension: Secondary | ICD-10-CM | POA: Diagnosis present

## 2023-11-17 LAB — CBC
HCT: 39 % (ref 39.0–52.0)
Hemoglobin: 12.2 g/dL — ABNORMAL LOW (ref 13.0–17.0)
MCH: 28.9 pg (ref 26.0–34.0)
MCHC: 31.3 g/dL (ref 30.0–36.0)
MCV: 92.4 fL (ref 80.0–100.0)
Platelets: 278 10*3/uL (ref 150–400)
RBC: 4.22 MIL/uL (ref 4.22–5.81)
RDW: 17.8 % — ABNORMAL HIGH (ref 11.5–15.5)
WBC: 6 10*3/uL (ref 4.0–10.5)
nRBC: 0 % (ref 0.0–0.2)

## 2023-11-17 LAB — BASIC METABOLIC PANEL WITH GFR
Anion gap: 7 (ref 5–15)
BUN: 29 mg/dL — ABNORMAL HIGH (ref 8–23)
CO2: 26 mmol/L (ref 22–32)
Calcium: 8.3 mg/dL — ABNORMAL LOW (ref 8.9–10.3)
Chloride: 107 mmol/L (ref 98–111)
Creatinine, Ser: 1.58 mg/dL — ABNORMAL HIGH (ref 0.61–1.24)
GFR, Estimated: 48 mL/min — ABNORMAL LOW (ref >60)
Glucose, Bld: 142 mg/dL — ABNORMAL HIGH (ref 70–99)
Potassium: 4.4 mmol/L (ref 3.5–5.1)
Sodium: 140 mmol/L (ref 135–145)

## 2023-11-17 LAB — BRAIN NATRIURETIC PEPTIDE: B Natriuretic Peptide: 1184 pg/mL — ABNORMAL HIGH (ref 0.0–100.0)

## 2023-11-17 MED ORDER — DM-GUAIFENESIN ER 30-600 MG PO TB12: 1.0000 | ORAL_TABLET | Freq: Two times a day (BID) | ORAL | Status: DC | PRN

## 2023-11-17 MED ORDER — ONDANSETRON HCL 4 MG/2ML IJ SOLN: 4.0000 mg | Freq: Three times a day (TID) | INTRAMUSCULAR | Status: DC | PRN

## 2023-11-17 MED ORDER — FUROSEMIDE 10 MG/ML IJ SOLN
40.0000 mg | Freq: Two times a day (BID) | INTRAMUSCULAR | Status: DC
  Administered 2023-11-18: 40 mg via INTRAVENOUS
  Filled 2023-11-17: qty 4

## 2023-11-17 MED ORDER — APIXABAN 5 MG PO TABS
5.0000 mg | ORAL_TABLET | Freq: Two times a day (BID) | ORAL | Status: DC
  Administered 2023-11-17: 5 mg via ORAL
  Filled 2023-11-17: qty 1

## 2023-11-17 MED ORDER — FUROSEMIDE 10 MG/ML IJ SOLN
40.0000 mg | Freq: Once | INTRAMUSCULAR | Status: AC
  Administered 2023-11-17: 40 mg via INTRAVENOUS
  Filled 2023-11-17: qty 4

## 2023-11-17 MED ORDER — ASPIRIN 81 MG PO TBEC
81.0000 mg | DELAYED_RELEASE_TABLET | Freq: Every day | ORAL | Status: DC
  Administered 2023-11-18 – 2023-11-20 (×3): 81 mg via ORAL
  Filled 2023-11-17 (×3): qty 1

## 2023-11-17 MED ORDER — HYDRALAZINE HCL 25 MG PO TABS
25.0000 mg | ORAL_TABLET | Freq: Three times a day (TID) | ORAL | Status: DC
  Administered 2023-11-17 – 2023-11-20 (×8): 25 mg via ORAL
  Filled 2023-11-17 (×8): qty 1

## 2023-11-17 MED ORDER — CARVEDILOL 6.25 MG PO TABS
6.2500 mg | ORAL_TABLET | Freq: Two times a day (BID) | ORAL | Status: DC
  Administered 2023-11-18 – 2023-11-20 (×5): 6.25 mg via ORAL
  Filled 2023-11-17 (×5): qty 1

## 2023-11-17 MED ORDER — NICOTINE 21 MG/24HR TD PT24
21.0000 mg | MEDICATED_PATCH | Freq: Every day | TRANSDERMAL | Status: DC
  Filled 2023-11-17 (×3): qty 1

## 2023-11-17 MED ORDER — ACETAMINOPHEN 325 MG PO TABS: 650.0000 mg | ORAL_TABLET | Freq: Four times a day (QID) | ORAL | Status: DC | PRN

## 2023-11-17 MED ORDER — HYDRALAZINE HCL 20 MG/ML IJ SOLN: 5.0000 mg | INTRAMUSCULAR | Status: DC | PRN

## 2023-11-17 MED ORDER — ALBUTEROL SULFATE (2.5 MG/3ML) 0.083% IN NEBU: 2.5000 mg | INHALATION_SOLUTION | RESPIRATORY_TRACT | Status: DC | PRN

## 2023-11-17 NOTE — H&P (Signed)
 History and Physical    Frederick Perry MVH:[id number] DOB: Mar 06, 1956 DOA: 11/17/2023  Referring MD/NP/PA:   PCP: Rex Castor, MD   Patient coming from:  The patient is coming from home.     Chief Complaint: SOB and leg edema  HPI: Frederick Perry is a 67 y.o. male with medical history significant of dCHF, PE/DVT (stopped taking Eliquis  in March 2025 due to lack of insurance), hypertension, hyperlipidemia, diabetes mellitus, CAD, CKD-3A, bladder cancer 2017 (s/p of tumor resection), tobacco abuse, polysubstance abuse, who presents with SOB and leg edema.  Patient states that he has shortness of breath in the past 4 days, which has been progressively worsening.  Patient has cough with little mucus production, denies chest pain.  Patient has orthopnea, cannot laying flat for sleeping.  No fever or chills.  He has worsening bilateral leg edema, and the right leg edema is worse than the left.  Patient had 1 episode of loose stool this morning, currently no active nausea, vomiting, diarrhea or abdominal pain.  No symptoms of UTI.  States that he ran out of his Eliquis  in March and torsemide  2 days ago.  Data reviewed independently and ED Course: pt was found to have BNP 1184, WBC 6.0, slightly worsening renal function, temperature normal, blood pressure 134/57, heart rate 67, RR 18, oxygen saturation 95% on room air.  Chest x-ray showed cardiomegaly and small right pleural effusion, peribronchial cuffing.  Patient is admitted to telemetry bed as inpatient.   EKG: I have personally reviewed.  Sinus rhythm, QTc 488, poor R wave progression, T wave inversion in the inferior leads, Q waves in the inferior leads and V5-V6, possible lead reversal   Review of Systems:   General: no fevers, chills, no body weight gain, has fatigue HEENT: no blurry vision, hearing changes or sore throat Respiratory: has dyspnea, coughing, no wheezing CV: no chest pain, no palpitations GI: no nausea,  vomiting, abdominal pain, diarrhea, constipation GU: no dysuria, burning on urination, increased urinary frequency, hematuria  Ext: has leg edema Neuro: no unilateral weakness, numbness, or tingling, no vision change or hearing loss Skin: no rash, no skin tear. MSK: No muscle spasm, no deformity, no limitation of range of movement in spin Heme: No easy bruising.  Travel history: No recent long distant travel.   Allergy: No Known Allergies  Past Medical History:  Diagnosis Date   Bladder cancer (HCC)    a. 12/2015 s/p resection.   CKD (chronic kidney disease), stage II    Coronary artery disease    a. 2017 Cath: occ OM2, occ PDA (both being filled via collats), mod dzs in LAD->med rx; b. 12/2020 Cath: LM nl, LAD 70p, 19m - dLAD fills via R->L collats. RI 100 CTO - fills via collats from OM1. LCX 53m/d. OM2 100 CTO. RCA 41m, 85d. RPDA 100 - fills via collats from OM3. EF 30-35%->med rx.   HFrEF (heart failure with reduced ejection fraction) (HCC)    a. 08/2015 Echo: EF 50-55%; b. 08/2019 Echo: EF 30-35%; c. 12/2020 Echo: EF 30-35%, GrII DD. Sev apical septal, ant, inf HK. Nl RV fxn. Mild MR. Triv AI. Mild-mod Ao sclerosis.   History of DVT (deep vein thrombosis)    a. 08/2015 - s/p IVC filter; b. 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   Hyperlipidemia LDL goal <70    Hypertension    Not on medications.stopped since he ran out of medications   IDA (iron deficiency anemia)    Ischemic cardiomyopathy  a. 08/2015 Echo: EF 50-55%; b. 08/2019 Echo: EF 30-35%; c. 12/2020 Echo: EF 30-35%.   Myocardial infarction [facility])    Perforated bowel (HCC)    a. 11/2015 s/p surgical repair.   Polysubstance abuse (HCC)    a. ongoing tobacco and alcohol abuse    Pulmonary embolism (HCC)    Type II diabetes mellitus (HCC)     Past Surgical History:  Procedure Laterality Date   BACK SURGERY     CARDIAC CATHETERIZATION N/A 09/05/2015   Procedure: Left Heart Cath and Coronary Angiography;  Surgeon: Devorah Fonder, MD;  Location: [facility] INVASIVE CV LAB;  Service: Cardiovascular;  Laterality: N/A;   DRAIN REMOVAL Right 12/20/2015   Procedure: DRAIN REMOVAL;  Surgeon: Bart Born, MD;  Location: [facility] ORS;  Service: Urology;  Laterality: Right;  by dr copper   LAPAROTOMY N/A 12/14/2015   Procedure: EXPLORATORY LAPAROTOMY, prepyloric gastric ulcer biopsy;  Surgeon: Alben Alma, MD;  Location: [facility] ORS;  Service: General;  Laterality: N/A;   LEFT HEART CATH AND CORONARY ANGIOGRAPHY Left 01/07/2021   Procedure: LEFT HEART CATH AND CORONARY ANGIOGRAPHY;  Surgeon: Sammy Crisp, MD;  Location: [facility] INVASIVE CV LAB;  Service: Cardiovascular;  Laterality: Left;   Patellar tendon repair Right    PERIPHERAL VASCULAR CATHETERIZATION N/A 09/07/2015   Procedure: IVC Filter Insertion;  Surgeon: Celso College, MD;  Location: [facility] INVASIVE CV LAB;  Service: Cardiovascular;  Laterality: N/A;   Testicular torsion repair     TRANSURETHRAL RESECTION OF BLADDER TUMOR N/A 12/20/2015   Procedure: TRANSURETHRAL RESECTION OF BLADDER TUMOR (TURBT);  Surgeon: Bart Born, MD;  Location: [facility] ORS;  Service: Urology;  Laterality: N/A;   TRANSURETHRAL RESECTION OF BLADDER TUMOR N/A 03/26/2016   Procedure: TRANSURETHRAL RESECTION OF BLADDER TUMOR (TURBT);  Surgeon: Bart Born, MD;  Location: [facility] ORS;  Service: Urology;  Laterality: N/A;    Social History:  reports that he has been smoking cigarettes. He has a 20 pack-year smoking history. He has never used smokeless tobacco. He reports current alcohol use of about 10.0 standard drinks of alcohol per week. He reports that he does not use drugs.  Family History:  Family History  Problem Relation Age of Onset   Congestive Heart Failure Mother    Peripheral vascular disease Father      Prior to Admission medications   Medication Sig Start Date End Date Taking? Authorizing Provider  apixaban  (ELIQUIS ) 5 MG TABS tablet Hold until followup with outpatient  urology due to blood in your urine. 01/02/23   Garrison Kanner, MD  aspirin  EC 81 MG tablet Take 81 mg by mouth daily.    [provider]  atorvastatin  (LIPITOR) 40 MG tablet Take 1 tablet (40 mg total) by mouth daily. 12/09/22 03/09/23  Gollan, Timothy J, MD  cetirizine (ZYRTEC) 10 MG tablet Take 10 mg by mouth daily as needed for allergies.    [provider]  hydrALAZINE  (APRESOLINE ) 25 MG tablet Take 1 tablet (25 mg total) by mouth 3 (three) times daily. Reduced from 37.5 mg. 01/02/23 04/02/23  Garrison Kanner, MD  isosorbide  mononitrate (IMDUR ) 30 MG 24 hr tablet Take 1 tablet (30 mg total) by mouth daily. 01/03/23   Garrison Kanner, MD  metoprolol  succinate (TOPROL -XL) 50 MG 24 hr tablet Take 1 tablet (50 mg total) by mouth daily. Take with or immediately following a meal. 01/03/23   Garrison Kanner, MD  torsemide  (DEMADEX ) 20 MG tablet Take 1 tablet (20 mg total) by  mouth daily. 01/02/23   Garrison Kanner, MD    Physical Exam: Vitals:   11/17/23 1504 11/17/23 1507  BP:  (!) 134/57  Pulse:  67  Resp:  18  Temp:  98.3 F (36.8 C)  TempSrc:  Oral  SpO2:  95%  Height: 6' 1.5" (1.867 m)    General: Not in acute distress HEENT:       Eyes: PERRL, EOMI, no jaundice       ENT: No discharge from the ears and nose, no pharynx injection, no tonsillar enlargement.        Neck: positive JVD, no bruit, no mass felt. Heme: No neck lymph node enlargement. Cardiac: S1/S2, RRR, No murmurs, No gallops or rubs. Respiratory: has fine crackles bilaterally GI: Soft, nondistended, nontender, no rebound pain, no organomegaly, BS present. GU: No hematuria Ext: Has asymmetric leg edema, with 3+ right leg edema, 2+ left leg edema Musculoskeletal: No joint deformities, No joint redness or warmth, no limitation of ROM in spin. Skin: No rashes.  Neuro: Alert, oriented X3, cranial nerves II-XII grossly intact, moves all extremities normally. Psych: Patient is not psychotic, no suicidal or hemocidal ideation.  Labs on  Admission: I have personally reviewed following labs and imaging studies  CBC: Recent Labs  Lab 11/17/23 1509  WBC 6.0  HGB 12.2*  HCT 39.0  MCV 92.4  PLT 278   Basic Metabolic Panel: Recent Labs  Lab 11/17/23 1509  NA 140  K 4.4  CL 107  CO2 26  GLUCOSE 142*  BUN 29*  CREATININE 1.58*  CALCIUM  8.3*   GFR: CrCl cannot be calculated (Unknown ideal weight.). Liver Function Tests: No results for input(s): "AST", "ALT", "ALKPHOS", "BILITOT", "PROT", "ALBUMIN " in the last 168 hours. No results for input(s): "LIPASE", "AMYLASE" in the last 168 hours. No results for input(s): "AMMONIA" in the last 168 hours. Coagulation Profile: No results for input(s): "INR", "PROTIME" in the last 168 hours. Cardiac Enzymes: No results for input(s): "CKTOTAL", "CKMB", "CKMBINDEX", "TROPONINI" in the last 168 hours. BNP (last 3 results) No results for input(s): "PROBNP" in the last 8760 hours. HbA1C: No results for input(s): "HGBA1C" in the last 72 hours. CBG: No results for input(s): "GLUCAP" in the last 168 hours. Lipid Profile: No results for input(s): "CHOL", "HDL", "LDLCALC", "TRIG", "CHOLHDL", "LDLDIRECT" in the last 72 hours. Thyroid  Function Tests: No results for input(s): "TSH", "T4TOTAL", "FREET4", "T3FREE", "THYROIDAB" in the last 72 hours. Anemia Panel: No results for input(s): "VITAMINB12", "FOLATE", "FERRITIN", "TIBC", "IRON", "RETICCTPCT" in the last 72 hours. Urine analysis:    Component Value Date/Time   COLORURINE RED (A) 12/30/2022 1700   APPEARANCEUR TURBID (A) 12/30/2022 1700   APPEARANCEUR Cloudy (A) 05/13/2016 1000   LABSPEC  12/30/2022 1700    TEST NOT REPORTED DUE TO COLOR INTERFERENCE OF URINE PIGMENT   PHURINE  12/30/2022 1700    TEST NOT REPORTED DUE TO COLOR INTERFERENCE OF URINE PIGMENT   GLUCOSEU (A) 12/30/2022 1700    TEST NOT REPORTED DUE TO COLOR INTERFERENCE OF URINE PIGMENT   HGBUR (A) 12/30/2022 1700    TEST NOT REPORTED DUE TO COLOR  INTERFERENCE OF URINE PIGMENT   BILIRUBINUR (A) 12/30/2022 1700    TEST NOT REPORTED DUE TO COLOR INTERFERENCE OF URINE PIGMENT   BILIRUBINUR Negative 05/13/2016 1000   KETONESUR (A) 12/30/2022 1700    TEST NOT REPORTED DUE TO COLOR INTERFERENCE OF URINE PIGMENT   PROTEINUR (A) 12/30/2022 1700    TEST NOT REPORTED DUE TO COLOR  INTERFERENCE OF URINE PIGMENT   NITRITE (A) 12/30/2022 1700    TEST NOT REPORTED DUE TO COLOR INTERFERENCE OF URINE PIGMENT   LEUKOCYTESUR (A) 12/30/2022 1700    TEST NOT REPORTED DUE TO COLOR INTERFERENCE OF URINE PIGMENT   Sepsis Labs: @LABRCNTIP (procalcitonin:4,lacticidven:4) )No results found for this or any previous visit (from the past 240 hours).   Radiological Exams on Admission:   Assessment/Plan Principal Problem:   Acute on chronic diastolic (congestive) heart failure (HCC) Active Problems:   Coronary artery disease   Essential hypertension   Hyperlipidemia   Type II diabetes mellitus with renal manifestations (HCC)   Chronic kidney disease, stage 3a (HCC)   Pulmonary embolism (HCC)   DVT (deep venous thrombosis) (HCC)   Nicotine  dependence   Obesity (BMI 30-39.9)   Assessment and Plan:   Acute on chronic diastolic (congestive) heart failure [facility]): pt has SOB, leg edema, elevated BNP 1184, crackles on auscultation, peribronchial cuffing by chest x-ray, clinically consistent with CHF exacerbation.  2D echo on 12/22/2022 showed EF 50-55% with grade 2 diastolic dysfunction.  Patient ran out of his torsemide  in the past 2 days.  -Will admit to tele bed as inpatient -Lasix  40 mg bid by IV -2d echo -Daily weights -strict I/O's -Low salt diet -Fluid restriction -As needed bronchodilators for shortness of breath  Coronary artery disease: No CP. - Continue aspirin  - Patient is not taking Lipitor currently  Essential hypertension -IV hydralazine  as needed - Coreg , hydralazine  orally - Patient is on IV Lasix   Hyperlipidemia -Patient is  not taking Lipitor currently - Check FLP  Diet controlled type II diabetes mellitus with renal manifestations (HCC): Recent A1c 5.4, well-controlled.  Patient's not taking medications currently. -Check CBG every morning  Chronic kidney disease, stage 3a [facility]): Renal function slightly worse than the baseline.  Recent baseline creatinine 1.2-1.4.  His creatinine is 1.58, BUN 29, GFR 48. -Follow-up with BMP  History of pulmonary embolism and  DVT: Patient ran out of his Eliquis  in March due to lack of insurance.  He would like to restart Eliquis .  Patient has asymmetric leg edema, will get lower extremity venous Doppler to rule out DVT.  No chest pain.  No oxygen desaturation today. - Follow-up lower extremity venous Doppler - Restarted Eliquis  5 mg twice daily - Consult TOC for medication help  Nicotine  dependence -Nicotine  patch  Obesity: Patient has Obesity Class II, with body weight  127.1Kg and BMI  36.46kg/m2.  - Encourage losing weight - Exercise and healthy diet      DVT ppx: on Eliquis   Code Status: Full code  Family Communication:     not done, no family member is at bed side.     Disposition Plan:  Anticipate discharge back to previous environment  Consults called:  none  Admission status and Level of care: Telemetry Cardiac: as inpt        Dispo: The patient is from: Home              Anticipated d/c is to: Home              Anticipated d/c date is: 2 days              Patient currently is not medically stable to d/c.    Severity of Illness:  The appropriate patient status for this patient is INPATIENT. Inpatient status is judged to be reasonable and necessary in order to provide the required intensity of service to ensure the patient's  safety. The patient's presenting symptoms, physical exam findings, and initial radiographic and laboratory data in the context of their chronic comorbidities is felt to place them at high risk for further clinical  deterioration. Furthermore, it is not anticipated that the patient will be medically stable for discharge from the hospital within 2 midnights of admission.   * I certify that at the point of admission it is my clinical judgment that the patient will require inpatient hospital care spanning beyond 2 midnights from the point of admission due to high intensity of service, high risk for further deterioration and high frequency of surveillance required.*       Date of Service 11/17/2023    Fidencio Hue [facility]   If 7PM-7AM, please contact night-coverage [email or web address] 11/17/2023, 8:37 PM

## 2023-11-17 NOTE — ED Provider Notes (Signed)
 [facility] Provider Note    Event Date/Time   First MD Initiated Contact with Patient 11/17/23 1635     (approximate)   History   Shortness of Breath   HPI  Frederick Perry is a 68 y.o. male with PMH of HTN, HFrEF, T2DM among other things listed in chart, presents for evaluation of SOB. Patient has had worsening symptoms for the past 4 days. He states that he ran out of his fluid pills two days ago. He has not been able to lay flat to sleep and has noticed increased swelling in his legs. He has dyspnea on exertion. Denies fever, cough and chest pain.      Physical Exam   Triage Vital Signs: ED Triage Vitals  Encounter Vitals Group     BP 11/17/23 1507 (!) 134/57     Systolic BP Percentile --      Diastolic BP Percentile --      Pulse Rate 11/17/23 1507 67     Resp 11/17/23 1507 18     Temp 11/17/23 1507 98.3 F (36.8 C)     Temp Source 11/17/23 1507 Oral     SpO2 11/17/23 1507 95 %     Weight --      Height 11/17/23 1504 6' 1.5" (1.867 m)     Head Circumference --      Peak Flow --      Pain Score 11/17/23 1504 0     Pain Loc --      Pain Education --      Exclude from Growth Chart --     Most recent vital signs: Vitals:   11/17/23 1507  BP: (!) 134/57  Pulse: 67  Resp: 18  Temp: 98.3 F (36.8 C)  SpO2: 95%   General: Awake, no distress.  CV:  Good peripheral perfusion. RRR. Resp:  Normal effort. Crackles at right lung base.  Abd:  No distention.  Other:  2+ pitting edema bilaterally up to the knees.   ED Results / Procedures / Treatments   Labs (all labs ordered are listed, but only abnormal results are displayed) Labs Reviewed  BASIC METABOLIC PANEL WITH GFR - Abnormal; Notable for the following components:      Result Value   Glucose, Bld 142 (*)    BUN 29 (*)    Creatinine, Ser 1.58 (*)    Calcium  8.3 (*)    GFR, Estimated 48 (*)    All other components within normal limits  CBC - Abnormal; Notable for the  following components:   Hemoglobin 12.2 (*)    RDW 17.8 (*)    All other components within normal limits  BRAIN NATRIURETIC PEPTIDE - Abnormal; Notable for the following components:   B Natriuretic Peptide 1,184.0 (*)    All other components within normal limits  HIV ANTIBODY (ROUTINE TESTING W REFLEX)  BASIC METABOLIC PANEL WITH GFR  CBC  MAGNESIUM   LIPID PANEL  CBG MONITORING, ED     EKG  ED provider interpretation: NSR with sinus arrhythmia   RADIOLOGY  Chest xray obtained, I interpreted the images as well a reviewed the radiologist report. Images show cardiomegaly with peribronchial cuffing and small right pleural effusion.   PROCEDURES:  Critical Care performed: No  Procedures   MEDICATIONS ORDERED IN ED: Medications  furosemide  (LASIX ) injection 40 mg (has no administration in time range)  albuterol  (PROVENTIL ) (2.5 MG/3ML) 0.083% nebulizer solution 2.5 mg (has no administration in time range)  dextromethorphan -guaiFENesin  (MUCINEX  DM) 30-600 MG per 12 hr tablet 1 tablet (has no administration in time range)  nicotine  (NICODERM CQ  - dosed in mg/24 hours) patch 21 mg (has no administration in time range)  ondansetron  (ZOFRAN ) injection 4 mg (has no administration in time range)  hydrALAZINE  (APRESOLINE ) injection 5 mg (has no administration in time range)  acetaminophen  (TYLENOL ) tablet 650 mg (has no administration in time range)  apixaban  (ELIQUIS ) tablet 5 mg (has no administration in time range)  aspirin  EC tablet 81 mg (has no administration in time range)  carvedilol  (COREG ) tablet 6.25 mg (has no administration in time range)  hydrALAZINE  (APRESOLINE ) tablet 25 mg (has no administration in time range)  furosemide  (LASIX ) injection 40 mg (40 mg Intravenous Given 11/17/23 1750)     IMPRESSION / MDM / ASSESSMENT AND PLAN / ED COURSE  I reviewed the triage vital signs and the nursing notes.                             68 year old male presents for evalaution  of SOB. BP slightly elevated but VSS otherwise. Patient NAD on exam.   Differential includes, but is not limited to, viral syndrome, bronchitis including COPD exacerbation, pneumonia, reactive airway disease including asthma, CHF including exacerbation with or without pulmonary/interstitial edema, pneumothorax.  Patient's presentation is most consistent with acute complicated illness / injury requiring diagnostic workup.  Suspect CHF exacerbation, will obtain labs, CXR and EKG.  Patient's work up is consistent with a CHF exacerbation, will give him a dose of lasix  and attempt to diurese in the ED.  Clinical Course as of 11/17/23 2012  Tue Nov 17, 2023  1837 CBC(!) Mild anemia but at patient's baseline. [LD]  1838 Mildly elevated creatinine, elevated BUN but elevated at baseline.  [LD]  1838 BNP (Order if Patient has history of Heart Failure)(!) Elevated, consistent with a CHF exacerbation. [LD]  1838 DG Chest 2 View Cardiomegaly with peribronchial cuffing and small right pleural effusion. [LD]  1839 EKG 12-Lead NSR with sinus arrhythmia. [LD]  [id number] Patient has been able to pee about 1000 mLs but still feels very SOB. Will admit patient for diuresis. Also expresses concern about being able to get his medications. [LD]    Clinical Course User Index [LD] Phyliss Breen, PA-C     FINAL CLINICAL IMPRESSION(S) / ED DIAGNOSES   Final diagnoses:  Acute on chronic congestive heart failure, unspecified heart failure type (HCC)     Rx / DC Orders   ED Discharge Orders     None        Note:  This document was prepared using Dragon voice recognition software and may include unintentional dictation errors.   Danton Dyers 11/17/23 2012    Jacquie Maudlin, MD 11/17/23 2035    Jacquie Maudlin, MD 11/17/23 2035

## 2023-11-17 NOTE — ED Triage Notes (Signed)
 Pt presents to the ED POV from home for [facility] and leg swelling. Pt reports that the leg swelling started about four days ago that the [facility] started two days ago. Pt reports running out of his lasix  yesterday. Pt A&Ox4. NAD.

## 2023-11-18 ENCOUNTER — Other Ambulatory Visit (HOSPITAL_COMMUNITY): Payer: Self-pay

## 2023-11-18 ENCOUNTER — Other Ambulatory Visit: Payer: Self-pay

## 2023-11-18 DIAGNOSIS — Z720 Tobacco use: Secondary | ICD-10-CM | POA: Diagnosis present

## 2023-11-18 LAB — CBG MONITORING, ED
Glucose-Capillary: 129 mg/dL — ABNORMAL HIGH (ref 70–99)
Glucose-Capillary: 152 mg/dL — ABNORMAL HIGH (ref 70–99)
Glucose-Capillary: 113 mg/dL — ABNORMAL HIGH (ref 70–99)

## 2023-11-18 LAB — HIV ANTIBODY (ROUTINE TESTING W REFLEX): HIV Screen 4th Generation wRfx: NONREACTIVE

## 2023-11-18 LAB — CBC
HCT: 38.6 % — ABNORMAL LOW (ref 39.0–52.0)
Hemoglobin: 11.5 g/dL — ABNORMAL LOW (ref 13.0–17.0)
MCH: 28.4 pg (ref 26.0–34.0)
MCHC: 29.8 g/dL — ABNORMAL LOW (ref 30.0–36.0)
MCV: 95.3 fL (ref 80.0–100.0)
Platelets: 259 10*3/uL (ref 150–400)
RBC: 4.05 MIL/uL — ABNORMAL LOW (ref 4.22–5.81)
RDW: 18 % — ABNORMAL HIGH (ref 11.5–15.5)
WBC: 5.9 10*3/uL (ref 4.0–10.5)
nRBC: 0 % (ref 0.0–0.2)

## 2023-11-18 LAB — BASIC METABOLIC PANEL WITH GFR
Anion gap: 8 (ref 5–15)
BUN: 28 mg/dL — ABNORMAL HIGH (ref 8–23)
CO2: 28 mmol/L (ref 22–32)
Calcium: 8.2 mg/dL — ABNORMAL LOW (ref 8.9–10.3)
Chloride: 104 mmol/L (ref 98–111)
Creatinine, Ser: 1.57 mg/dL — ABNORMAL HIGH (ref 0.61–1.24)
GFR, Estimated: 48 mL/min — ABNORMAL LOW (ref >60)
Glucose, Bld: 178 mg/dL — ABNORMAL HIGH (ref 70–99)
Potassium: 4.1 mmol/L (ref 3.5–5.1)
Sodium: 140 mmol/L (ref 135–145)

## 2023-11-18 LAB — LIPID PANEL
Cholesterol: 135 mg/dL (ref 0–200)
HDL: 36 mg/dL — ABNORMAL LOW (ref >40)
LDL Cholesterol: 76 mg/dL (ref 0–99)
Total CHOL/HDL Ratio: 3.8 ratio
Triglycerides: 114 mg/dL (ref <150)
VLDL: 23 mg/dL (ref 0–40)

## 2023-11-18 LAB — GLUCOSE, CAPILLARY: Glucose-Capillary: 126 mg/dL — ABNORMAL HIGH (ref 70–99)

## 2023-11-18 LAB — MAGNESIUM: Magnesium: 2.6 mg/dL — ABNORMAL HIGH (ref 1.7–2.4)

## 2023-11-18 MED ORDER — FUROSEMIDE 10 MG/ML IJ SOLN
80.0000 mg | Freq: Once | INTRAMUSCULAR | Status: AC
  Administered 2023-11-18: 80 mg via INTRAVENOUS
  Filled 2023-11-18: qty 8

## 2023-11-18 MED ORDER — FUROSEMIDE 10 MG/ML IJ SOLN
40.0000 mg | INTRAMUSCULAR | Status: AC
  Administered 2023-11-18: 40 mg via INTRAVENOUS

## 2023-11-18 MED ORDER — INSULIN ASPART 100 UNIT/ML IJ SOLN
0.0000 [IU] | Freq: Three times a day (TID) | INTRAMUSCULAR | Status: DC
  Administered 2023-11-18: 3 [IU] via SUBCUTANEOUS
  Administered 2023-11-19 – 2023-11-20 (×2): 2 [IU] via SUBCUTANEOUS
  Administered 2023-11-20: 3 [IU] via SUBCUTANEOUS
  Filled 2023-11-18 (×4): qty 1

## 2023-11-18 MED ORDER — INSULIN ASPART 100 UNIT/ML IJ SOLN: 0.0000 [IU] | Freq: Every day | INTRAMUSCULAR | Status: DC

## 2023-11-18 MED ORDER — ENOXAPARIN SODIUM 60 MG/0.6ML IJ SOSY
60.0000 mg | PREFILLED_SYRINGE | INTRAMUSCULAR | Status: DC
  Administered 2023-11-18 – 2023-11-20 (×3): 60 mg via SUBCUTANEOUS
  Filled 2023-11-18 (×3): qty 0.6

## 2023-11-18 MED ORDER — ATORVASTATIN CALCIUM 20 MG PO TABS
40.0000 mg | ORAL_TABLET | Freq: Every day | ORAL | Status: DC
  Administered 2023-11-18 – 2023-11-20 (×3): 40 mg via ORAL
  Filled 2023-11-18 (×3): qty 2

## 2023-11-18 MED ORDER — ISOSORBIDE MONONITRATE ER 30 MG PO TB24
30.0000 mg | ORAL_TABLET | Freq: Every day | ORAL | Status: DC
  Administered 2023-11-18 – 2023-11-20 (×3): 30 mg via ORAL
  Filled 2023-11-18 (×3): qty 1

## 2023-11-18 MED ORDER — FUROSEMIDE 10 MG/ML IJ SOLN
80.0000 mg | Freq: Two times a day (BID) | INTRAMUSCULAR | Status: DC
  Filled 2023-11-18: qty 8

## 2023-11-18 NOTE — Plan of Care (Signed)

## 2023-11-18 NOTE — ED Notes (Signed)
 Called floor to notify of patients departure from ED.

## 2023-11-18 NOTE — ED Notes (Signed)
 Pt's oxygen saturations dropping in the 80s. Pt placed back on oxygen via [place]. Dr. Sari Cunning made aware of same.

## 2023-11-18 NOTE — Progress Notes (Signed)
 PROGRESS NOTE    Frederick Perry  WUX:[id number] DOB: January 12, 1956 DOA: 11/17/2023 PCP: Rex Castor, MD  Outpatient Specialists: none    Brief Narrative:   From admission h and p  Frederick Perry is a 68 y.o. male with medical history significant of dCHF, PE/DVT (stopped taking Eliquis  in March 2025 due to lack of insurance), hypertension, hyperlipidemia, diabetes mellitus, CAD, CKD-3A, bladder cancer 2017 (s/p of tumor resection), tobacco abuse, polysubstance abuse, who presents with SOB and leg edema.   Patient states that he has shortness of breath in the past 4 days, which has been progressively worsening.  Patient has cough with little mucus production, denies chest pain.  Patient has orthopnea, cannot laying flat for sleeping.  No fever or chills.  He has worsening bilateral leg edema, and the right leg edema is worse than the left.  Patient had 1 episode of loose stool this morning, currently no active nausea, vomiting, diarrhea or abdominal pain.  No symptoms of UTI.  States that he ran out of his Eliquis  in March and torsemide  2 days ago.  Assessment & Plan:   Principal Problem:   Acute on chronic diastolic (congestive) heart failure (HCC) Active Problems:   Coronary artery disease   Essential hypertension   Hyperlipidemia   Type II diabetes mellitus with renal manifestations (HCC)   Chronic kidney disease, stage 3a (HCC)   Pulmonary embolism (HCC)   DVT (deep venous thrombosis) (HCC)   Nicotine  dependence   Bladder cancer (HCC)   Obesity (BMI 30-39.9)   PAF (paroxysmal atrial fibrillation) (HCC)   Tobacco user  # HFrecoveredEF Most recent ef 50-55. Here with chf exacerbation with exertional dyspnea, lower extremity edema, orthopnea, secondary to medication noncompliance. Has also not followed up with cardiology as advised - continue diuresis with lasix  80 for home torsmemide 20 - cont home imdur Lajuana Pilar - cont home coreg  - holding on attempting additional  gdmt given recurrent history of poor compliance and f/u  # Acute hypoxic respiratory failure 2/2 above. Appears resolved - monitor off o2 today  # History bladder cancer # Intermittent hematuria Diagnosed 2017, incompletely resected, has not followed up despite documentation of repeated attempts to tell him to do so. Today patient told me he was told he does not have bladder cancer. Given number of times this has been documented I find this hard to believe, and I told him under no uncertain terms that he did have bladder cancer, that it was incompletely resected, that he is overdue for f/u, and that his persistent episodes of hematuria are quite possibly 2/2 ongoing malignancy. I clearly and strongly advised him to follow up with urology.   # History DVT/PE Anticoagulation held in November 2/2 recurrent hematuria with reintroduction of apixaban . Has ivc filter. PVLs here negative for DVT - will hold DOAC pending urology f/u  # Paroxysmal a-fib NSR currently - continue home BB  # T2DM Euglycemic - SSI  # CAD No chest pain - cont home asa, statin  # Obesity Noted  # Tobacco abuse - patch     DVT prophylaxis: lovenox  Code Status: full Family Communication: none at bedside, no answer when daughter telephoned  Level of care: Telemetry Cardiac Status is: Inpatient Remains inpatient appropriate because: need for IV diuresis    Consultants:  none  Procedures: none  Antimicrobials:  none    Subjective: No pain, no sob at rest  Objective: Vitals:   11/18/23 0500 11/18/23 0530 11/18/23 0600 11/18/23 0605  BP: 125/69 Frederick Perry)  146/95 130/68   Pulse: (!) 58 65 64   Resp: 15 17 16    Temp:    98.1 F (36.7 C)  TempSrc:    Oral  SpO2: 98% 97% 91%   Height:       No intake or output data in the 24 hours ending 11/18/23 0832 There were no vitals filed for this visit.  Examination:  General exam: Appears calm and comfortable  Respiratory system: rales at bases  otherwise clear Cardiovascular system: S1 & S2 heard, RRR.   Gastrointestinal system: Abdomen is obese, soft and nontender.   Central nervous system: Alert and oriented. No focal neurological deficits. Extremities: Symmetric 5 x 5 power. Pitting edema to upper legs Skin: No rashes, lesions or ulcers Psychiatry: Judgement and insight appear normal. Mood & affect appropriate.     Data Reviewed: I have personally reviewed following labs and imaging studies  CBC: Recent Labs  Lab 11/17/23 1509 11/18/23 0511  WBC 6.0 5.9  HGB 12.2* 11.5*  HCT 39.0 38.6*  MCV 92.4 95.3  PLT 278 259   Basic Metabolic Panel: Recent Labs  Lab 11/17/23 1509 11/18/23 0511  NA 140 140  K 4.4 4.1  CL 107 104  CO2 26 28  GLUCOSE 142* 178*  BUN 29* 28*  CREATININE 1.58* 1.57*  CALCIUM  8.3* 8.2*  MG  --  2.6*   GFR: CrCl cannot be calculated (Unknown ideal weight.). Liver Function Tests: No results for input(s): "AST", "ALT", "ALKPHOS", "BILITOT", "PROT", "ALBUMIN " in the last 168 hours. No results for input(s): "LIPASE", "AMYLASE" in the last 168 hours. No results for input(s): "AMMONIA" in the last 168 hours. Coagulation Profile: No results for input(s): "INR", "PROTIME" in the last 168 hours. Cardiac Enzymes: No results for input(s): "CKTOTAL", "CKMB", "CKMBINDEX", "TROPONINI" in the last 168 hours. BNP (last 3 results) No results for input(s): "PROBNP" in the last 8760 hours. HbA1C: No results for input(s): "HGBA1C" in the last 72 hours. CBG: Recent Labs  Lab 11/18/23 0742  GLUCAP 129*   Lipid Profile: Recent Labs    11/18/23 0511  CHOL 135  HDL 36*  LDLCALC 76  TRIG 161  CHOLHDL 3.8   Thyroid  Function Tests: No results for input(s): "TSH", "T4TOTAL", "FREET4", "T3FREE", "THYROIDAB" in the last 72 hours. Anemia Panel: No results for input(s): "VITAMINB12", "FOLATE", "FERRITIN", "TIBC", "IRON", "RETICCTPCT" in the last 72 hours. Urine analysis:    Component Value Date/Time    COLORURINE RED (A) 12/30/2022 1700   APPEARANCEUR TURBID (A) 12/30/2022 1700   APPEARANCEUR Cloudy (A) 05/13/2016 1000   LABSPEC  12/30/2022 1700    TEST NOT REPORTED DUE TO COLOR INTERFERENCE OF URINE PIGMENT   PHURINE  12/30/2022 1700    TEST NOT REPORTED DUE TO COLOR INTERFERENCE OF URINE PIGMENT   GLUCOSEU (A) 12/30/2022 1700    TEST NOT REPORTED DUE TO COLOR INTERFERENCE OF URINE PIGMENT   HGBUR (A) 12/30/2022 1700    TEST NOT REPORTED DUE TO COLOR INTERFERENCE OF URINE PIGMENT   BILIRUBINUR (A) 12/30/2022 1700    TEST NOT REPORTED DUE TO COLOR INTERFERENCE OF URINE PIGMENT   BILIRUBINUR Negative 05/13/2016 1000   KETONESUR (A) 12/30/2022 1700    TEST NOT REPORTED DUE TO COLOR INTERFERENCE OF URINE PIGMENT   PROTEINUR (A) 12/30/2022 1700    TEST NOT REPORTED DUE TO COLOR INTERFERENCE OF URINE PIGMENT   NITRITE (A) 12/30/2022 1700    TEST NOT REPORTED DUE TO COLOR INTERFERENCE OF URINE PIGMENT   LEUKOCYTESUR (  A) 12/30/2022 1700    TEST NOT REPORTED DUE TO COLOR INTERFERENCE OF URINE PIGMENT   Sepsis Labs: @LABRCNTIP (procalcitonin:4,lacticidven:4)  )No results found for this or any previous visit (from the past 240 hours).       Radiology Studies: US  Venous Img Lower Bilateral (DVT) Result Date: 11/17/2023 CLINICAL DATA:  Bilateral leg swelling for 4 days. Previous history of DVT. Now with asymmetrical leg edema. EXAM: Bilateral LOWER EXTREMITY VENOUS DOPPLER ULTRASOUND TECHNIQUE: Gray-scale sonography with compression, as well as color and duplex ultrasound, were performed to evaluate the deep venous system(s) from the level of the common femoral vein through the popliteal and proximal calf veins. COMPARISON:  None Available. FINDINGS: VENOUS Normal compressibility of the bilateral common femoral, superficial femoral, and popliteal veins, as well as the visualized calf veins. Visualized portions of profunda femoral vein and great saphenous vein unremarkable. No filling defects  to suggest DVT on grayscale or color Doppler imaging. Doppler waveforms show normal direction of venous flow, normal respiratory plasticity and response to augmentation. OTHER None. Limitations: none IMPRESSION: No evidence of acute deep venous thrombosis in the visualized lower extremity veins. Electronically Signed   By: Boyce Byes M.D.   On: 11/17/2023 21:42   DG Chest 2 View Result Date: 11/17/2023 CLINICAL DATA:  Shortness of breath. EXAM: CHEST - 2 VIEW COMPARISON:  Radiograph 12/22/2022, CT 12/26/2022 FINDINGS: Moderate cardiomegaly. Peribronchial cuffing which may be bronchitic or congestive. Mild flattening of the diaphragms. Suspect small right pleural effusion. No focal airspace disease. No pneumothorax. Mild scoliosis and degenerative change in the spine. IMPRESSION: 1. Moderate cardiomegaly. Peribronchial cuffing which may be bronchitic or congestive. 2. Suspect small right pleural effusion. Electronically Signed   By: Chadwick Colonel M.D.   On: 11/17/2023 17:03        Scheduled Meds:  aspirin  EC  81 mg Oral Daily   carvedilol   6.25 mg Oral BID WC   furosemide   40 mg Intravenous Q12H   hydrALAZINE   25 mg Oral TID   nicotine   21 mg Transdermal Daily   Continuous Infusions:   LOS: 1 day     Raymonde Calico, MD [facility]   If 7PM-7AM, please contact night-coverage [email or web address] Password [facility] 11/18/2023, 8:32 AM

## 2023-11-18 NOTE — ED Notes (Signed)
 Dr. Sari Cunning advised he discontinued pt's oxygen administration at this time and to monitor pt's status without it.

## 2023-11-18 NOTE — Progress Notes (Signed)
 Heart Failure Stewardship Pharmacy Note  PCP: Rex Castor, MD PCP-Cardiologist: Constancia Delton, MD  HPI: Frederick Perry is a 68 y.o. male with dCHF, PE/DVT (stopped taking Eliquis  in March 2025 due to lack of insurance), hypertension, hyperlipidemia, diabetes mellitus, CAD, CKD-3A, bladder cancer 2017 (s/p incomplete tumor resection), paroxysmal AF, former tobacco use who presented with shortness of breath and lower extremity edema. Patient had stopped the majority of medications due to cost and recently ran out of carvedilol , hydralazine , and torsemide . His late mother had CHF. On admission, BNP was 1184 and A1c pending. Chest x-ray noted peribronchial cuffing and small right pleural effusion.   Pertinent cardiac history: LHC in 08/2015 showed occluded OM2 and PDA, both filled with collaterals, treated with medical management. Echo at that time showed LVEF 50-55%. IVC filter palced 08/2015. Doppler 11/2015 with partially occlusive thrombus of RLE with similar findings in 06/2019. Echo in 08/2019 showed LVEF of 30-35% with global hypokinesis, grade I diastolic dysfunction, and low-normal RV function. LHC in 12/2020 showed severe 3-vessel disease treated with medical management. Echo 12/2020 with LVEF 30-35%. Most recent echo 12/2022 with LVEF uup to 50-55%, grade II diastolic dysfunction, mildly reduced RV function, mild MR and mild late systolic prolapse of posterior leaflet concerning for ruptured chord. No echo this admission.  Pertinent Lab Values: Creatinine, Ser  Date Value Ref Range Status  11/18/2023 1.57 (H) 0.61 - 1.24 mg/dL Final   BUN  Date Value Ref Range Status  11/18/2023 28 (H) 8 - 23 mg/dL Final  78/29/5621 12 8 - 27 mg/dL Final   Potassium  Date Value Ref Range Status  11/18/2023 4.1 3.5 - 5.1 mmol/L Final   Sodium  Date Value Ref Range Status  11/18/2023 140 135 - 145 mmol/L Final  02/06/2021 139 134 - 144 mmol/L Final   B Natriuretic Peptide  Date Value  Ref Range Status  11/17/2023 1,184.0 (H) 0.0 - 100.0 pg/mL Final    Comment:    Performed at [facility], [address] [place], [place] [address]   Magnesium   Date Value Ref Range Status  11/18/2023 2.6 (H) 1.7 - 2.4 mg/dL Final    Comment:    Performed at [facility], [address] [place], [place] [address]   Hgb A1c MFr Bld  Date Value Ref Range Status  01/04/2021 5.4 4.8 - 5.6 % Final    Comment:    (NOTE)         Prediabetes: 5.7 - 6.4         Diabetes: >6.4         Glycemic control for adults with diabetes: <7.0    TSH  Date Value Ref Range Status  12/25/2022 1.358 0.350 - 4.500 uIU/mL Final    Comment:    Performed by a 3rd Generation assay with a functional sensitivity of <=0.01 uIU/mL. Performed at [facility], [address] [place], [place] [address]   02/04/2018 2.770 0.450 - 4.500 uIU/mL Final   LDH  Date Value Ref Range Status  09/05/2015 667 (H) 98 - 192 U/L Final   Vital Signs:  Temp:  [98.1 F (36.7 C)-98.3 F (36.8 C)] 98.1 F (36.7 C) (06/04 0605) Pulse Rate:  [58-76] 61 (06/04 1030) Cardiac Rhythm: Normal sinus rhythm (06/04 0826) Resp:  [15-20] 17 (06/04 1030) BP: (116-159)/(57-95) 116/65 (06/04 1030) SpO2:  [91 %-100 %] 100 % (06/04 1030) Weight:  [127 kg (280 lb)] 127 kg (280 lb) (06/04 0826) No intake or output  data in the 24 hours ending 11/18/23 1052  Current Heart Failure Medications:  Loop diuretic: furosemide  40 mg IV this AM and 80 mg IV this evening. Beta-Blocker: carvedilol  6.25 mg BID ACEI/ARB/ARNI: none MRA: none SGLT2i: none Other: hydralazine  25 mg TID and isosorbide  ER 30 mg daily  Prior to admission Heart Failure Medications:  Loop diuretic: torsemide  20 mg daily Beta-Blocker: carvedilol  6.25 mg BID ACEI/ARB/ARNI: MRA: SGLT2i: Other: hydralazine  25 mg TID  Assessment: 1. Acute on chronic combined systolic and diastolic heart failure (LVEF improved to 50-55% in 2024 from 30-35%)  and grade II diastolic dysfunction, due to ICM. NYHA class III symptoms.  -Symptoms: Patient reports improvement in shortness of breath and orthopnea, though both are still present. LEE is persistent. Reports appetite is good. -Volume: Patient is hypervolemic. Significant LEE on exam. Received 40 mg IV this AM and scheduled to receive 80 mg IV this evening. Agree with twice daily diuresis, can schedule dosing tomorrow based on response. No I/Os documented. Creatinine this AM is stable. -Hemodynamics: BP elevated on admission, but trending down with hydralazine . -BB: Continue carvedilol  6.25 mg BID. Would delay titration until euvolemic. -ACEI/ARB/ARNI: Can consider adding losartan during admission.  -MRA: Consider adding spironolactone tomorrow if creatinine continues to trend down. -SGLT2i: Would benefit from SGLT2i, however would require patient assistance at this time due to lack of coverage. -Patient meets criteria for anticoagulation based on CHADSVASc. Will follow plans for anticoagulation.  Plan: 1) Medication changes recommended at this time: -Consider adding spironolactone 12.5 mg tomorrow   2) Patient assistance: -Patient is uninsured. Patient will need patient assistance for Eliquis  and SGLT2i if started.  3) Education: - Patient has been educated on current HF medications and potential additions to HF medication regimen - Patient verbalizes understanding that over the next few months, these medication doses may change and more medications may be added to optimize HF regimen - Patient has been educated on basic disease state pathophysiology and goals of therapy  Medication Assistance / Insurance Benefits Check: Does the patient have prescription insurance?    Type of insurance plan:  Does the patient qualify for medication assistance through manufacturers or grants? Pending  Eligible grants and/or patient assistance programs: Pending  Medication assistance applications in  progress: Pending  Medication assistance applications approved: Pending Approved medication assistance renewals will be completed by: Pending  Outpatient Pharmacy: [facility]      Please do not hesitate to reach out with questions or concerns,  Bevely Brush, PharmD, CPP, BCPS Heart Failure Pharmacist  Phone - [phone] 11/18/2023 10:52 AM

## 2023-11-18 NOTE — ED Notes (Signed)
 Pt waking up at this time. Pt is CAOx4, breathing normally, and normal in color. Pt chest leads changed out and repositioned correctly. Pt given hot washrag to wipe his face. Pt given remote control. Pt advised would administer morning meds when his breakfast came.

## 2023-11-18 NOTE — TOC Initial Note (Signed)
 Transition of Care [facility]) - Initial/Assessment Note    Patient Details  Name: Frederick Perry MRN: [id number] Date of Birth: April 03, 1956  [facility]) CM/SW Contact:    Arminda Landmark, RN Phone Number: 11/18/2023, 4:46 PM  Clinical Narrative:                 [facility] consult placed for medication assistance and possible SNF placement. Pt states his health caused him to retire early and he didn't sign up for medicare during the open enrollment period. All he has now is medicare part A and isn't always able to afford is medications. He went 4 days without his diuretic and ended up in the hospital. Gave coupon for eliquis  and will consult the pharmacy for medication assistance. He lives in an apartment by himself. He's able to drive himself to his appointments and is compliant with doctors appointments. He doesn't use any DME but feels he could use a shower chair. He has some difficulty with ADL's, especially when his legs swell. He's open to having HHC or going to a SNF if needed. He denies having any open wounds. TOC to continue to follow and will notify the pharmacy about the assistance he needs.   Expected Discharge Plan: Home w Home Health Services Barriers to Discharge: Continued Medical Work up   Patient Goals and CMS Choice            Expected Discharge Plan and Services   Discharge Planning Services: CM Consult, Medication Assistance   Living arrangements for the past 2 months: Apartment                                      Prior Living Arrangements/Services Living arrangements for the past 2 months: Apartment Lives with:: Self Patient language and need for interpreter reviewed:: Yes Do you feel safe going back to the place where you live?: Yes      Need for Family Participation in Patient Care: Yes (Comment) Care giver support system in place?: No (comment) Current home services:  (None but could use a shower chair) Criminal Activity/Legal  Involvement Pertinent to Current Situation/Hospitalization: No - Comment as needed  Activities of Daily Living   ADL Screening (condition at time of admission) Independently performs ADLs?: Yes (appropriate for developmental age) Is the patient deaf or have difficulty hearing?: No Does the patient have difficulty seeing, even when wearing glasses/contacts?: No Does the patient have difficulty concentrating, remembering, or making decisions?: No  Permission Sought/Granted                  Emotional Assessment Appearance:: Appears stated age Attitude/Demeanor/Rapport: Gracious Affect (typically observed): Appropriate, Accepting, Calm, Pleasant Orientation: : Oriented to Self, Oriented to Place, Oriented to  Time, Oriented to Situation Alcohol / Substance Use: Never Used Psych Involvement: No (comment)  Admission diagnosis:  Acute on chronic diastolic CHF (congestive heart failure) (HCC) [I50.33] Acute on chronic diastolic (congestive) heart failure (HCC) [I50.33] Acute on chronic congestive heart failure, unspecified heart failure type (HCC) [I50.9] Patient Active Problem List   Diagnosis Date Noted   Tobacco user 11/18/2023   Acute on chronic diastolic (congestive) heart failure (HCC) 11/17/2023   DVT (deep venous thrombosis) (HCC) 11/17/2023   Type II diabetes mellitus with renal manifestations (HCC) 11/17/2023   Chronic kidney disease, stage 3a (HCC) 11/17/2023   Acute on chronic diastolic CHF (congestive heart failure) (HCC)  11/17/2023   Obesity (BMI 30-39.9) 11/17/2023   PAF (paroxysmal atrial fibrillation) (HCC) 04/22/2023   Acute on chronic congestive heart failure (HCC) 12/24/2022   Elevated d-dimer 12/24/2022   Acute hypoxemic respiratory failure (HCC) 12/22/2022   Morbid obesity (HCC) 12/22/2022   Elevated troponin 12/22/2022   Acute systolic CHF (congestive heart failure) (HCC)    AKI (acute kidney injury) (HCC)    Acute on chronic HFrEF (heart failure with  reduced ejection fraction) (HCC) 01/07/2021   HFrEF (heart failure with reduced ejection fraction) (HCC) 01/07/2021   Essential hypertension    Coronary artery disease    Pulmonary embolism (HCC)    Nicotine  dependence    Palpitation 01/01/2021   Diabetes mellitus without complication (HCC) 02/04/2018   Bladder cancer (HCC) 03/26/2016   Perforated bowel (HCC) 12/14/2015   Perforation bowel (HCC)    Iron deficiency anemia due to chronic blood loss 09/07/2015   Weight loss    NSTEMI (non-ST elevated myocardial infarction) (HCC) 09/05/2015   Absolute anemia    Ischemic chest pain (HCC)    Uncontrolled type 2 diabetes mellitus with circulatory disorder    Hyperlipidemia    PCP:  Rex Castor, MD Pharmacy:   [phone] [place] [place] - [address] [place] [address] Phone: [phone] Fax: [phone]  [facility] [address] [place] [address] Phone: [phone] Fax: [phone]     Social Drivers of Health (SDOH) Social History: SDOH Screenings   Food Insecurity: No Food Insecurity (11/18/2023)  Housing: Low Risk  (11/18/2023)  Transportation Needs: No Transportation Needs (11/18/2023)  Utilities: Not At Risk (11/18/2023)  Financial Resource Strain: Low Risk  (04/23/2023)   Received from [facility]  Physical Activity: Inactive (04/23/2023)   Received from [facility]  Social Connections: Moderately Isolated (11/18/2023)  Stress: No Stress Concern Present (04/23/2023)   Received from [facility]  Tobacco Use: High Risk (11/17/2023)  Health Literacy: Adequate Health Literacy (05/22/2023)   Received [facility]   SDOH Interventions:     Readmission Risk Interventions     No data to display

## 2023-11-19 ENCOUNTER — Encounter: Payer: Self-pay | Admitting: Internal Medicine

## 2023-11-19 LAB — HEMOGLOBIN A1C
Hgb A1c MFr Bld: 6.7 % — ABNORMAL HIGH (ref 4.8–5.6)
Mean Plasma Glucose: 145.59 mg/dL

## 2023-11-19 LAB — BASIC METABOLIC PANEL WITH GFR
Anion gap: 10 (ref 5–15)
BUN: 30 mg/dL — ABNORMAL HIGH (ref 8–23)
CO2: 31 mmol/L (ref 22–32)
Calcium: 8.3 mg/dL — ABNORMAL LOW (ref 8.9–10.3)
Chloride: 102 mmol/L (ref 98–111)
Creatinine, Ser: 1.63 mg/dL — ABNORMAL HIGH (ref 0.61–1.24)
GFR, Estimated: 46 mL/min — ABNORMAL LOW (ref >60)
Glucose, Bld: 104 mg/dL — ABNORMAL HIGH (ref 70–99)
Potassium: 4.1 mmol/L (ref 3.5–5.1)
Sodium: 143 mmol/L (ref 135–145)

## 2023-11-19 LAB — D-DIMER, QUANTITATIVE: D-Dimer, Quant: 0.52 ug{FEU}/mL — ABNORMAL HIGH (ref 0.00–0.50)

## 2023-11-19 LAB — GLUCOSE, CAPILLARY
Glucose-Capillary: 116 mg/dL — ABNORMAL HIGH (ref 70–99)
Glucose-Capillary: 128 mg/dL — ABNORMAL HIGH (ref 70–99)
Glucose-Capillary: 94 mg/dL (ref 70–99)
Glucose-Capillary: 159 mg/dL — ABNORMAL HIGH (ref 70–99)

## 2023-11-19 MED ORDER — FUROSEMIDE 10 MG/ML IJ SOLN
80.0000 mg | Freq: Two times a day (BID) | INTRAMUSCULAR | Status: AC
  Administered 2023-11-19 (×2): 80 mg via INTRAVENOUS
  Filled 2023-11-19 (×2): qty 8

## 2023-11-19 NOTE — Progress Notes (Signed)
 Heart Failure Nurse Navigator Progress Note  PCP: Perry Castor, MD PCP-Cardiologist: Constancia Delton, MD Admission Diagnosis: Acute on chronic congestive heart failure, unspecified heart failure type [facility]) Admitted from: Home via POV  Presentation:   Frederick Perry presented with shortness of breath (2 days prior) and leg swelling (4 days prior). Patient reported running out of medications due to cost. BNP 1184. Chest x-ray: moderate cardiomegaly,peribronchial cuffing and small right pleural effusion. He had not been able to lay flat to sleep and had noticed increased swelling in his legs.  He has had dyspnea on exertion.  Denies fever, cough and chest pain. History of tobacco use.    ECHO/ LVEF: 50-55% in 2024 Grade II diastolic dysfunction (pseudonormalization)  Clinical Course:  Past Medical History:  Diagnosis Date   Bladder cancer (HCC)    a. 12/2015 s/p resection.   CKD (chronic kidney disease), stage II    Coronary artery disease    a. 2017 Cath: occ OM2, occ PDA (both being filled via collats), mod dzs in LAD->med rx; b. 12/2020 Cath: LM nl, LAD 70p, 29m - dLAD fills via R->L collats. RI 100 CTO - fills via collats from OM1. LCX 64m/d. OM2 100 CTO. RCA 43m, 85d. RPDA 100 - fills via collats from OM3. EF 30-35%->med rx.   HFrEF (heart failure with reduced ejection fraction) (HCC)    a. 08/2015 Echo: EF 50-55%; b. 08/2019 Echo: EF 30-35%; c. 12/2020 Echo: EF 30-35%, GrII DD. Sev apical septal, ant, inf HK. Nl RV fxn. Mild MR. Triv AI. Mild-mod Ao sclerosis.   History of DVT (deep vein thrombosis)    a. 08/2015 - s/p IVC filter; b. 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   Hyperlipidemia LDL goal <70    Hypertension    Not on medications.stopped since he ran out of medications   IDA (iron deficiency anemia)    Ischemic cardiomyopathy    a. 08/2015 Echo: EF 50-55%; b. 08/2019 Echo: EF 30-35%; c. 12/2020 Echo: EF 30-35%.   Myocardial infarction [facility])    Perforated bowel (HCC)     a. 11/2015 s/p surgical repair.   Polysubstance abuse (HCC)    a. ongoing tobacco and alcohol abuse    Pulmonary embolism (HCC)    Type II diabetes mellitus (HCC)      Social History   Socioeconomic History   Marital status: Divorced    Spouse name: Not on file   Number of children: 3   Years of education: Not on file   Highest education level: 12th grade  Occupational History   Occupation: Unemployed  Tobacco Use   Smoking status: Former    Current packs/day: 0.50    Average packs/day: 0.5 packs/day for 40.0 years (20.0 ttl pk-yrs)    Types: Cigarettes   Smokeless tobacco: Never  Vaping Use   Vaping status: Never Used  Substance and Sexual Activity   Alcohol use: Yes    Alcohol/week: 10.0 standard drinks of alcohol    Types: 10 Cans of beer per week    Comment: weekly- beers and occasionally liquor   Drug use: No   Sexual activity: Not on file  Other Topics Concern   Not on file  Social History Narrative   Lives at home with Mother and is his Mothers primary caregiver   Social Drivers of Health   Financial Resource Strain: Medium Risk (11/19/2023)   Overall Financial Resource Strain (CARDIA)    Difficulty of Paying Living Expenses: Somewhat hard  Food Insecurity: Food Insecurity  Present (11/19/2023)   Hunger Vital Sign    Worried About Running Out of Food in the Last Year: Sometimes true    Ran Out of Food in the Last Year: Sometimes true  Transportation Needs: No Transportation Needs (11/18/2023)   PRAPARE - Administrator, Civil Service (Medical): No    Lack of Transportation (Non-Medical): No  Physical Activity: Inactive (04/23/2023)   Received from [facility]   Exercise Vital Sign    Days of Exercise per Week: 0 days    Minutes of Exercise per Session: 0 min  Stress: No Stress Concern Present (04/23/2023)   Received from [employer] [facility]   [employer] of Occupational Health - Occupational Stress Questionnaire     Feeling of Stress : Not at all  Social Connections: Moderately Isolated (11/18/2023)   Social Connection and Isolation Panel [NHANES]    Frequency of Communication with Friends and Family: Three times a week    Frequency of Social Gatherings with Friends and Family: Once a week    Attends Religious Services: 1 to 4 times per year    Active Member of [employer] or Organizations: No    Attends Engineer, structural: Never    Marital Status: Divorced   Water engineer and Provision:  Detailed education and instructions provided on heart failure disease management including the following:  Signs and symptoms of Heart Failure When to call the physician Importance of daily weights Low sodium diet Fluid restriction Medication management Anticipated future follow-up appointments  Patient education given on each of the above topics.  Patient acknowledges understanding via teach back method and acceptance of all instructions.  Education Materials:  "Living Better With Heart Failure" Booklet, HF zone tool, & Daily Weight Tracker Tool.  Patient has scale at home: No.  Will provide him with one since he does not have one in his boarding house. Patient has pill box at home: No but has a system that works for him.    High Risk Criteria for Readmission and/or Poor Patient Outcomes: Heart failure hospital admissions (last 6 months): 1  No Show rate: 26% Difficult social situation: Lives in a boarding house Demonstrates medication adherence: No. Reports running out of medications. Primary Language: English Literacy level: Reading, Writing, & Comprehension  Barriers of Care:   Transportation to appointments -patient provided with transportation information from the LCSW on his discharge AVS.  Considerations/Referrals:   Referral made to Heart Failure Pharmacist Stewardship: Yes Referral made to Heart Failure CSW/NCM TOC: Yes for transportation resources. Referral made to Heart &  Vascular TOC clinic: Yes. TOC 11/26/2023.  Items for Follow-up on DC/TOC: Diet & Fluid Restrictions Daily weights Continued Heart Failure Medication Education Continued Heart Failure Education  Celedonio Coil, RN, BSN [facility] Heart Failure Navigator Secure Chat Only

## 2023-11-19 NOTE — Progress Notes (Signed)
 Heart Failure Stewardship Pharmacy Note  PCP: Rex Castor, MD PCP-Cardiologist: Constancia Delton, MD  HPI: Frederick Perry is a 68 y.o. male with dCHF, PE/DVT (stopped taking Eliquis  in March 2025 due to lack of insurance), hypertension, hyperlipidemia, diabetes mellitus, CAD, CKD-3A, bladder cancer 2017 (s/p incomplete tumor resection), paroxysmal AF, former tobacco use who presented with shortness of breath and lower extremity edema. Patient had stopped the majority of medications due to cost and recently ran out of carvedilol , hydralazine , and torsemide . His late mother had CHF. On admission, BNP was 1184 and A1c pending. Chest x-ray noted peribronchial cuffing and small right pleural effusion.   Pertinent cardiac history: LHC in 08/2015 showed occluded OM2 and PDA, both filled with collaterals, treated with medical management. Echo at that time showed LVEF 50-55%. IVC filter palced 08/2015. Doppler 11/2015 with partially occlusive thrombus of RLE with similar findings in 06/2019. Echo in 08/2019 showed LVEF of 30-35% with global hypokinesis, grade I diastolic dysfunction, and low-normal RV function. LHC in 12/2020 showed severe 3-vessel disease treated with medical management. Echo 12/2020 with LVEF 30-35%. Most recent echo 12/2022 with LVEF uup to 50-55%, grade II diastolic dysfunction, mildly reduced RV function, mild MR and mild late systolic prolapse of posterior leaflet concerning for ruptured chord. No echo this admission.  Pertinent Lab Values: Creatinine, Ser  Date Value Ref Range Status  11/19/2023 1.63 (H) 0.61 - 1.24 mg/dL Final   BUN  Date Value Ref Range Status  11/19/2023 30 (H) 8 - 23 mg/dL Final  16/03/9603 12 8 - 27 mg/dL Final   Potassium  Date Value Ref Range Status  11/19/2023 4.1 3.5 - 5.1 mmol/L Final   Sodium  Date Value Ref Range Status  11/19/2023 143 135 - 145 mmol/L Final  02/06/2021 139 134 - 144 mmol/L Final   B Natriuretic Peptide  Date Value  Ref Range Status  11/17/2023 1,184.0 (H) 0.0 - 100.0 pg/mL Final    Comment:    Performed at [facility], [address] [place], [place] [address]   Magnesium   Date Value Ref Range Status  11/18/2023 2.6 (H) 1.7 - 2.4 mg/dL Final    Comment:    Performed at [facility] [address] [place], [place] [address]   Hgb A1c MFr Bld  Date Value Ref Range Status  01/04/2021 5.4 4.8 - 5.6 % Final    Comment:    (NOTE)         Prediabetes: 5.7 - 6.4         Diabetes: >6.4         Glycemic control for adults with diabetes: <7.0    TSH  Date Value Ref Range Status  12/25/2022 1.358 0.350 - 4.500 uIU/mL Final    Comment:    Performed by a 3rd Generation assay with a functional sensitivity of <=0.01 uIU/mL. Performed at [facility], [address] [place], [place] [address]   02/04/2018 2.770 0.450 - 4.500 uIU/mL Final   LDH  Date Value Ref Range Status  09/05/2015 667 (H) 98 - 192 U/L Final   Vital Signs:  Temp:  [97.6 F (36.4 C)-98.3 F (36.8 C)] 98 F (36.7 C) (06/05 0822) Pulse Rate:  [56-71] 68 (06/05 0822) Cardiac Rhythm: Normal sinus rhythm (06/04 2000) Resp:  [15-24] 17 (06/04 2200) BP: (108-128)/(53-76) 126/76 (06/05 0822) SpO2:  [89 %-100 %] 89 % (06/05 0822) Weight:  [130 kg (286 lb 9.6 oz)] 130 kg (286 lb 9.6 oz) (06/05 0500)  Intake/Output Summary (Last 24 hours) at 11/19/2023 0957 Last data filed at 11/19/2023 0700 Gross per 24 hour  Intake 220 ml  Output 2150 ml  Net -1930 ml    Current Heart Failure Medications:  Loop diuretic: furosemide  80 mg IV BID Beta-Blocker: carvedilol  6.25 mg BID ACEI/ARB/ARNI: none MRA: none SGLT2i: none Other: hydralazine  25 mg TID and isosorbide  ER 30 mg daily  Prior to admission Heart Failure Medications:  Loop diuretic: torsemide  20 mg daily Beta-Blocker: carvedilol  6.25 mg BID ACEI/ARB/ARNI: MRA: SGLT2i: Other: hydralazine  25 mg TID  Assessment: 1. Acute on chronic combined  systolic and diastolic heart failure (LVEF improved to 50-55% in 2024 from 30-35%) and grade II diastolic dysfunction, due to ICM. NYHA class III symptoms.  -Symptoms: Patient reports improvement in shortness of breath and orthopnea, though both are still present. LEE is improved. Reports appetite is good. -Volume: Patient likely still has some volume on board. Less LEE on exam. Unsure of I/O accuracy Today's weight is discordant with yesterday. Creatinine this AM is mildly increased, but relatively stable. Agree with continuing furosemide  today. -Hemodynamics: BP elevated on admission, but now controlled with hydralazine . -BB: Continue carvedilol  6.25 mg BID. Would delay titration until euvolemic. -ACEI/ARB/ARNI: Can consider adding losartan during admission if creatinine stable for added benefit in CHF and for renal protection in DM. -MRA: Consider adding spironolactone prior to discharge. -SGLT2i: Would benefit from SGLT2i, however would require patient assistance at this time due to lack of coverage. -Patient meets criteria for anticoagulation based on CHADSVASc. Will follow plans for anticoagulation.  Plan: 1) Medication changes recommended at this time: -None  2) Patient assistance: -Patient is uninsured. Patient will need patient assistance for Eliquis  and SGLT2i if started.  3) Education: - Patient has been educated on current HF medications and potential additions to HF medication regimen - Patient verbalizes understanding that over the next few months, these medication doses may change and more medications may be added to optimize HF regimen - Patient has been educated on basic disease state pathophysiology and goals of therapy  Medication Assistance / Insurance Benefits Check: Does the patient have prescription insurance?    Type of insurance plan:  Does the patient qualify for medication assistance through manufacturers or grants? Pending  Eligible grants and/or patient  assistance programs: Pending  Medication assistance applications in progress: Pending  Medication assistance applications approved: Pending Approved medication assistance renewals will be completed by: Pending  Outpatient Pharmacy: [facility]      Please do not hesitate to reach out with questions or concerns,  Bevely Brush, PharmD, CPP, BCPS Heart Failure Pharmacist  Phone - [phone] 11/19/2023 9:57 AM

## 2023-11-19 NOTE — Discharge Instructions (Addendum)
 Transportation Resources for [employer]  Agency Name: [facility] Agency Address: [address] [place], [place] [address] Phone: [phone] Email: [email or web address] Website: [email or web address] Service(s) Offered: Housing services, self-sufficiency, congregate meal program, weatherization program, Field seismologist program, emergency food assistance,  housing counseling, home ownership program, wheels-towork program.  Agency Name: [facility] [employer] [phone]) Address: [address] [place], [place] [address] Phone: [phone] Website: [email or web address].[email or web address] Service(s) Offered: Transportation for [employer], subscription and demand response; Dial-a-Ride for citizens 65 years of age or older.  Agency Name: Department of Social Services Address: [address] [place], [place] [address] Phone: [phone] Service(s) Offered: Child support services; child welfare services; food stamps; Medicaid; work first family assistance; and aid with fuel,  rent, food and medicine, transportation assistance.  Agency Name: Disabled [employer] (DAV) Transportation  Network Phone: [phone] Service(s) Offered: Transports veterans to the [facility] medical center. Call  forty-eight hours in advance and leave the name, telephone  number, date, and time of appointment. Veteran will be  contacted by the driver the day before the appointment to  arrange a pick up point    [employer] ACTA currently provides door to door services. ACTA connects with PART daily for services to [facility]. ACTA also performs contract services to [employer] operates 27 vehicles, all but 3 mini-vans are equipped with lifts for special needs as well as the general public. ACTA drivers are each CDL certified and trained in First Aid and CPR. ACTA was established in 2002 by [employer]. An independent Industrial/product designer. ACTA operates via Cytogeneticist with required local 10% match funding from [place]. ACTA provides over 80,000 passenger trips each year, including [facility] and [employer] sites.  Call at least by 11 AM one business day prior to needing transportation  [employer].                      [place], [place] [address]     Office Hours: Monday-Friday  8 AM - 5 PM   Food Resources  Agency Name: [facility] Agency Address: [address], [place], [place] [address] Phone: [phone] Website: [email or web address] Service(s) Offered: Housing services, self-sufficiency, congregate meal program, weatherization program, Event organiser program, emergency food assistance,  housing counseling, home ownership program, wheels - to work program.  [employer] free for 60 and older at various locations from [employer], Monday-Friday:  [employer], [address] [place], [phone] -[facility], [address], [place] [phone]  -[facility], [address], [place] [phone]  -[address], [address] [place], [phone]  Agency Name: [facility] on Wheels Address: [phone] [address], [place], [place] [address] Phone: [phone] Website: [email or web address] Service(s) Offered: Home delivered hot, frozen, and emergency  meals. Grocery assistance program which matches  volunteers one-on-one with seniors unable to grocery shop  for themselves. Must be 60 years and older; less than 20  hours of in-home aide service, limited or no driving ability;  live alone or with someone with a disability; live in  [place].  Agency Name: Ecologist [facility] [employer]) Address: [address], [place], [place] [address] Phone:  [phone] Service(s) Offered: Food is served to shut-ins, homeless, elderly, and low income people in the community every Saturday (11:30 am-12:30 pm) and Sunday (12:30 pm-1:30pm). Volunteers also offer help  and encouragement in seeking employment,  and spiritual guidance.  Agency Name: Department of Social Services Address: [address] [place], [place] [address] Phone: [phone] Service(s) Offered: Child support services; child welfare services; food stamps; Medicaid; work first family assistance; and aid with fuel,  rent, food and medicine.  Agency Name: Dietitian Address: [address] [place], [place] Phone: [phone] Website: www.[email or web address] Services Offered: Monday 10:00am-12:00, 8:00pm-9:00pm, and Friday 10:00am-12:00.  Agency Name: [employer] Address: [address] [place], [place] [address] Phone: [phone] Website: [email or web address] Service(s) Offered: Serves weekday meals, open from 11:30 am- 1:00 pm., and 6:30-7:30pm, Monday-Wednesday-Friday distributes food 3:30-6pm, Monday-Wednesday-Friday.  Agency Name: [facility] Address: [address] [place], [place] Phone: [phone] Website: [email or web address] Services Offered: Distributes food the 4th Saturday of the month, starting at 8:00 am  Agency Name: [facility] Address: [phone] [address] [place], [place] [address] Phone: [phone] Website: [email or web address] Service(s) Offered: Bread of life, weekly food pantry. Open Wednesdays from 10:00am-noon.  Agency Name: [facility] [employer] Address: [address] [place], [place], [place] Phone: [phone] Services Offered: Distributes food 9am-1pm, Monday-Thursday. Call for details.  Agency Name: [facility] Address: [address], [place], [place] [address] Phone: [phone] Website:  [email or web address] Service(s) Offered: Games developer. Call for assistance.  Agency Name: [facility] Address: [address] [place], [place] [address] Phone: [phone] Service Offered: Emergency Food Pantry. Call for appointment.  Agency Name: [facility] Address: [address], [place], [place] [address] Phone: [phone] Website: [email or web address] Services Offered: Games developer. Call for details  Agency Name: [facility] Address: [address] [place], [place] Phone: [phone] Website: [email or web address] Service(s) Offered: Emergency Food Pantry. Call for details.  Agency Name: Holiday representative Address: [address], [place], [place] [address] Phone: [phone] or [phone] Website: [email or web address] Service(s) Offered: Distribute food 9am-11:30 am, Tuesday-Friday, and 1-3:30pm, Monday-Friday. Food pantry Monday-Friday 1pm-3pm, fresh items, Mon.-Wed.-Fri.  Agency Name: [facility] Address: [address] [place], [place] [address] Phone: [phone] Website: www.[email or web address] Services Offered: Distribute food Tues and Sats from 9:00am-noon. Closed 1st Saturday of each month. Call for details  Agency Name: [employer] Address: [address]ne [address] [facility] [address] [place] [address] Phone: [phone]  Services Offered: Delivers meals every Thursday

## 2023-11-19 NOTE — Care Management Important Message (Signed)
 Important Message  Patient Details  Name: Frederick Perry MRN: [id number] Date of Birth: 1955/08/25   Important Message Given:  Yes - Medicare IM     Anise Kerns 11/19/2023, 12:34 PM

## 2023-11-19 NOTE — TOC Progression Note (Signed)
 Transition of Care [facility]) - Progression Note    Patient Details  Name: Frederick Perry MRN: [id number] Date of Birth: Mar 04, 1956  Transition of Care [facility]) CM/SW Contact  Odilia Bennett, LCSW Phone Number: 11/19/2023, 12:39 PM  Clinical Narrative:   SDOH flags for food and transportation. Resources added to AVS.  Expected Discharge Plan: Home w Home Health Services Barriers to Discharge: Continued Medical Work up  Expected Discharge Plan and Services   Discharge Planning Services: CM Consult, Medication Assistance   Living arrangements for the past 2 months: Apartment                                       Social Determinants of Health (SDOH) Interventions SDOH Screenings   Food Insecurity: Food Insecurity Present (11/19/2023)  Housing: Low Risk  (11/19/2023)  Transportation Needs: Unmet Transportation Needs (11/19/2023)  Utilities: Not At Risk (11/18/2023)  Financial Resource Strain: Medium Risk (11/19/2023)  Physical Activity: Inactive (04/23/2023)   [facility]  Social Connections: Moderately Isolated (11/18/2023)  Stress: No Stress Concern Present (04/23/2023)   [facility] from [facility]  Tobacco Use: Medium Risk (11/19/2023)  Health Literacy: Adequate Health Literacy (05/22/2023)   Received from [facility]    Readmission Risk Interventions    11/19/2023    8:29 AM  Readmission Risk Prevention Plan  Transportation Screening Complete  PCP or Specialist Appt within 3-5 Days Complete  Social Work Consult for Recovery Care Planning/Counseling Complete  Palliative Care Screening Not Applicable  Medication Review Oceanographer) Referral to Pharmacy

## 2023-11-19 NOTE — Progress Notes (Signed)
 PROGRESS NOTE    TEEJAY MEADER  YQI:[id number] DOB: [birth year] DOA: 11/17/2023 PCP: Rex Castor, MD  Outpatient Specialists: none    Brief Narrative:   From admission h and p  DOMIQUE CLAPPER is a 68 y.o. male with medical history significant of dCHF, PE/DVT (stopped taking Eliquis  in March 2025 due to lack of insurance), hypertension, hyperlipidemia, diabetes mellitus, CAD, CKD-3A, bladder cancer 2017 (s/p of tumor resection), tobacco abuse, polysubstance abuse, who presents with SOB and leg edema.   Patient states that he has shortness of breath in the past 4 days, which has been progressively worsening.  Patient has cough with little mucus production, denies chest pain.  Patient has orthopnea, cannot laying flat for sleeping.  No fever or chills.  He has worsening bilateral leg edema, and the right leg edema is worse than the left.  Patient had 1 episode of loose stool this morning, currently no active nausea, vomiting, diarrhea or abdominal pain.  No symptoms of UTI.  States that he ran out of his Eliquis  in March and torsemide  2 days ago.  Assessment & Plan:   Principal Problem:   Acute on chronic diastolic (congestive) heart failure (HCC) Active Problems:   Coronary artery disease   Essential hypertension   Hyperlipidemia   Type II diabetes mellitus with renal manifestations (HCC)   Chronic kidney disease, stage 3a (HCC)   Pulmonary embolism (HCC)   DVT (deep venous thrombosis) (HCC)   Nicotine  dependence   Bladder cancer (HCC)   Obesity (BMI 30-39.9)   PAF (paroxysmal atrial fibrillation) (HCC)   Tobacco user  # HFrecoveredEF Most recent ef 50-55. Here with chf exacerbation with exertional dyspnea, lower extremity edema, orthopnea, secondary to medication noncompliance. Has also not followed up with cardiology as advised. Symptomatically improving but still with o2 requirement. Bun/cr have slightly up-trended - continue diuresis with lasix  80 for home  torsmemide 20 - cont home imdur Lajuana Pilar - cont home coreg  - update TTE - holding on attempting additional gdmt given recurrent history of poor compliance and f/u  # Acute hypoxic respiratory failure Most likely 2/2 above. Lungs are clear but remains hypoxic to upper 80s. CXR most consistent w/ chf. Does have history of PE and is not anticoagulated - continue [place] O2 - check dimer  # History bladder cancer # Intermittent hematuria Diagnosed 2017, incompletely resected, has not followed up despite documentation of repeated attempts to tell him to do so. Today patient told me he was told he does not have bladder cancer. Given number of times this has been documented I find this hard to believe, and I told him under no uncertain terms that he did have bladder cancer, that it was incompletely resected, that he is overdue for f/u, and that his persistent episodes of hematuria are quite possibly 2/2 ongoing malignancy. I clearly and strongly advised him to follow up with urology.   # History DVT/PE Anticoagulation held in November 2/2 recurrent hematuria with reintroduction of apixaban . Has ivc filter. PVLs here negative for DVT - will hold DOAC pending urology f/u  # Paroxysmal a-fib NSR currently - continue home BB  # T2DM Euglycemic - SSI  # CAD No chest pain - cont home asa, statin  # Obesity Noted  # Tobacco abuse - patch     DVT prophylaxis: lovenox  Code Status: full Family Communication: none at bedside, no answer when daughter telephoned yesterday or today  Level of care: Telemetry Cardiac Status is: Inpatient Remains inpatient appropriate because: need  for IV diuresis    Consultants:  none  Procedures: none  Antimicrobials:  none    Subjective: Dyspnea and lower extremity swelling improving  Objective: Vitals:   11/19/23 0131 11/19/23 0500 11/19/23 0501 11/19/23 0822  BP: 108/63  116/60 126/76  Pulse: (!) 56  63 68  Resp:      Temp: 97.8 F (36.6  C)  97.6 F (36.4 C) 98 F (36.7 C)  TempSrc: Oral  Oral   SpO2: 99%  96% (!) 89%  Weight:  130 kg    Height:        Intake/Output Summary (Last 24 hours) at 11/19/2023 1057 Last data filed at 11/19/2023 1029 Gross per 24 hour  Intake 460 ml  Output 2150 ml  Net -1690 ml   Filed Weights   11/18/23 0826 11/19/23 0500  Weight: 127 kg 130 kg    Examination:  General exam: Appears calm and comfortable  Respiratory system: rales at bases otherwise clear Cardiovascular system: S1 & S2 heard, RRR.   Gastrointestinal system: Abdomen is obese, soft and nontender.   Central nervous system: Alert and oriented. No focal neurological deficits. Extremities: Symmetric 5 x 5 power. Pitting edema of LEs is much improved Skin: No rashes, lesions or ulcers Psychiatry: Judgement and insight appear normal. Mood & affect appropriate.     Data Reviewed: I have personally reviewed following labs and imaging studies  CBC: Recent Labs  Lab 11/17/23 1509 11/18/23 0511  WBC 6.0 5.9  HGB 12.2* 11.5*  HCT 39.0 38.6*  MCV 92.4 95.3  PLT 278 259   Basic Metabolic Panel: Recent Labs  Lab 11/17/23 1509 11/18/23 0511 11/19/23 0252  NA 140 140 143  K 4.4 4.1 4.1  CL 107 104 102  CO2 26 28 31   GLUCOSE 142* 178* 104*  BUN 29* 28* 30*  CREATININE 1.58* 1.57* 1.63*  CALCIUM  8.3* 8.2* 8.3*  MG  --  2.6*  --    GFR: Estimated Creatinine Clearance: 63.5 mL/min (A) (by C-G formula based on SCr of 1.63 mg/dL (H)). Liver Function Tests: No results for input(s): "AST", "ALT", "ALKPHOS", "BILITOT", "PROT", "ALBUMIN " in the last 168 hours. No results for input(s): "LIPASE", "AMYLASE" in the last 168 hours. No results for input(s): "AMMONIA" in the last 168 hours. Coagulation Profile: No results for input(s): "INR", "PROTIME" in the last 168 hours. Cardiac Enzymes: No results for input(s): "CKTOTAL", "CKMB", "CKMBINDEX", "TROPONINI" in the last 168 hours. BNP (last 3 results) No results for  input(s): "PROBNP" in the last 8760 hours. HbA1C: No results for input(s): "HGBA1C" in the last 72 hours. CBG: Recent Labs  Lab 11/18/23 0742 11/18/23 1106 11/18/23 1628 11/18/23 2148 11/19/23 0825  GLUCAP 129* 152* 113* 126* 116*   Lipid Profile: Recent Labs    11/18/23 0511  CHOL 135  HDL 36*  LDLCALC 76  TRIG 562  CHOLHDL 3.8   Thyroid  Function Tests: No results for input(s): "TSH", "T4TOTAL", "FREET4", "T3FREE", "THYROIDAB" in the last 72 hours. Anemia Panel: No results for input(s): "VITAMINB12", "FOLATE", "FERRITIN", "TIBC", "IRON", "RETICCTPCT" in the last 72 hours. Urine analysis:    Component Value Date/Time   COLORURINE RED (A) 12/30/2022 1700   APPEARANCEUR TURBID (A) 12/30/2022 1700   APPEARANCEUR Cloudy (A) 05/13/2016 1000   LABSPEC  12/30/2022 1700    TEST NOT REPORTED DUE TO COLOR INTERFERENCE OF URINE PIGMENT   PHURINE  12/30/2022 1700    TEST NOT REPORTED DUE TO COLOR INTERFERENCE OF URINE PIGMENT  GLUCOSEU (A) 12/30/2022 1700    TEST NOT REPORTED DUE TO COLOR INTERFERENCE OF URINE PIGMENT   HGBUR (A) 12/30/2022 1700    TEST NOT REPORTED DUE TO COLOR INTERFERENCE OF URINE PIGMENT   BILIRUBINUR (A) 12/30/2022 1700    TEST NOT REPORTED DUE TO COLOR INTERFERENCE OF URINE PIGMENT   BILIRUBINUR Negative 05/13/2016 1000   KETONESUR (A) 12/30/2022 1700    TEST NOT REPORTED DUE TO COLOR INTERFERENCE OF URINE PIGMENT   PROTEINUR (A) 12/30/2022 1700    TEST NOT REPORTED DUE TO COLOR INTERFERENCE OF URINE PIGMENT   NITRITE (A) 12/30/2022 1700    TEST NOT REPORTED DUE TO COLOR INTERFERENCE OF URINE PIGMENT   LEUKOCYTESUR (A) 12/30/2022 1700    TEST NOT REPORTED DUE TO COLOR INTERFERENCE OF URINE PIGMENT   Sepsis Labs: @LABRCNTIP (procalcitonin:4,lacticidven:4)  )No results found for this or any previous visit (from the past 240 hours).       Radiology Studies: US  Venous Img Lower Bilateral (DVT) Result Date: 11/17/2023 CLINICAL DATA:  Bilateral leg  swelling for 4 days. Previous history of DVT. Now with asymmetrical leg edema. EXAM: Bilateral LOWER EXTREMITY VENOUS DOPPLER ULTRASOUND TECHNIQUE: Gray-scale sonography with compression, as well as color and duplex ultrasound, were performed to evaluate the deep venous system(s) from the level of the common femoral vein through the popliteal and proximal calf veins. COMPARISON:  None Available. FINDINGS: VENOUS Normal compressibility of the bilateral common femoral, superficial femoral, and popliteal veins, as well as the visualized calf veins. Visualized portions of profunda femoral vein and great saphenous vein unremarkable. No filling defects to suggest DVT on grayscale or color Doppler imaging. Doppler waveforms show normal direction of venous flow, normal respiratory plasticity and response to augmentation. OTHER None. Limitations: none IMPRESSION: No evidence of acute deep venous thrombosis in the visualized lower extremity veins. Electronically Signed   By: Boyce Byes M.D.   On: 11/17/2023 21:42   DG Chest 2 View Result Date: 11/17/2023 CLINICAL DATA:  Shortness of breath. EXAM: CHEST - 2 VIEW COMPARISON:  Radiograph 12/22/2022, CT 12/26/2022 FINDINGS: Moderate cardiomegaly. Peribronchial cuffing which may be bronchitic or congestive. Mild flattening of the diaphragms. Suspect small right pleural effusion. No focal airspace disease. No pneumothorax. Mild scoliosis and degenerative change in the spine. IMPRESSION: 1. Moderate cardiomegaly. Peribronchial cuffing which may be bronchitic or congestive. 2. Suspect small right pleural effusion. Electronically Signed   By: Chadwick Colonel M.D.   On: 11/17/2023 17:03        Scheduled Meds:  aspirin  EC  81 mg Oral Daily   atorvastatin   40 mg Oral Daily   carvedilol   6.25 mg Oral BID WC   enoxaparin  (LOVENOX ) injection  60 mg Subcutaneous Q24H   hydrALAZINE   25 mg Oral TID   insulin  aspart  0-15 Units Subcutaneous TID WC   insulin  aspart  0-5  Units Subcutaneous QHS   isosorbide  mononitrate  30 mg Oral Daily   nicotine   21 mg Transdermal Daily   Continuous Infusions:   LOS: 2 days     Raymonde Calico, MD [facility]   If 7PM-7AM, please contact night-coverage [email or web address] Password [facility] 11/19/2023, 10:57 AM

## 2023-11-19 NOTE — Plan of Care (Signed)
 Problem: Education: Goal: Ability to describe self-care measures that may prevent or decrease complications (Diabetes Survival Skills Education) will improve Outcome: Progressing Goal: Individualized Educational Video(s) Outcome: Progressing   Problem: Coping: Goal: Ability to adjust to condition or change in health will improve 11/19/2023 0548 by Court Distance, RN Outcome: Progressing 11/19/2023 0548 by Court Distance, RN Outcome: Progressing   Problem: Fluid Volume: Goal: Ability to maintain a balanced intake and output will improve 11/19/2023 0548 by Milia Warth J, RN Outcome: Progressing 11/19/2023 0548 by Court Distance, RN Outcome: Progressing   Problem: Health Behavior/Discharge Planning: Goal: Ability to identify and utilize available resources and services will improve 11/19/2023 0548 by Court Distance, RN Outcome: Progressing 11/19/2023 0548 by Court Distance, RN Outcome: Progressing Goal: Ability to manage health-related needs will improve 11/19/2023 0548 by Court Distance, RN Outcome: Progressing 11/19/2023 0548 by Court Distance, RN Outcome: Progressing   Problem: Metabolic: Goal: Ability to maintain appropriate glucose levels will improve 11/19/2023 0548 by Court Distance, RN Outcome: Progressing 11/19/2023 0548 by Court Distance, RN Outcome: Progressing   Problem: Nutritional: Goal: Maintenance of adequate nutrition will improve 11/19/2023 0548 by Court Distance, RN Outcome: Progressing 11/19/2023 0548 by Court Distance, RN Outcome: Progressing Goal: Progress toward achieving an optimal weight will improve 11/19/2023 0548 by Court Distance, RN Outcome: Progressing 11/19/2023 0548 by Court Distance, RN Outcome: Progressing   Problem: Skin Integrity: Goal: Risk for impaired skin integrity will decrease 11/19/2023 0548 by Court Distance, RN Outcome: Progressing 11/19/2023 0548 by Court Distance, RN Outcome: Progressing   Problem: Tissue  Perfusion: Goal: Adequacy of tissue perfusion will improve 11/19/2023 0548 by Court Distance, RN Outcome: Progressing 11/19/2023 0548 by Court Distance, RN Outcome: Progressing   Problem: Education: Goal: Knowledge of General Education information will improve Description: Including pain rating scale, medication(s)/side effects and non-pharmacologic comfort measures 11/19/2023 0548 by Court Distance, RN Outcome: Progressing 11/19/2023 0548 by Court Distance, RN Outcome: Progressing   Problem: Health Behavior/Discharge Planning: Goal: Ability to manage health-related needs will improve 11/19/2023 0548 by Court Distance, RN Outcome: Progressing 11/19/2023 0548 by Court Distance, RN Outcome: Progressing   Problem: Clinical Measurements: Goal: Ability to maintain clinical measurements within normal limits will improve 11/19/2023 0548 by Court Distance, RN Outcome: Progressing 11/19/2023 0548 by Court Distance, RN Outcome: Progressing Goal: Will remain free from infection 11/19/2023 0548 by Court Distance, RN Outcome: Progressing 11/19/2023 0548 by Court Distance, RN Outcome: Progressing Goal: Diagnostic test results will improve 11/19/2023 0548 by Court Distance, RN Outcome: Progressing 11/19/2023 0548 by Court Distance, RN Outcome: Progressing Goal: Respiratory complications will improve 11/19/2023 0548 by Court Distance, RN Outcome: Progressing 11/19/2023 0548 by Court Distance, RN Outcome: Progressing Goal: Cardiovascular complication will be avoided 11/19/2023 0548 by Court Distance, RN Outcome: Progressing 11/19/2023 0548 by Court Distance, RN Outcome: Progressing   Problem: Activity: Goal: Risk for activity intolerance will decrease Outcome: Progressing   Problem: Nutrition: Goal: Adequate nutrition will be maintained Outcome: Progressing   Problem: Coping: Goal: Level of anxiety will decrease Outcome: Progressing   Problem: Elimination: Goal: Will not  experience complications related to bowel motility Outcome: Progressing Goal: Will not experience complications related to urinary retention Outcome: Progressing   Problem: Pain Managment: Goal: General experience of comfort will improve and/or be controlled 11/19/2023 0548 by Mozell Hardacre J, RN Outcome: Progressing 11/19/2023 0548 by  Altamese Deguire J, RN Outcome: Progressing   Problem: Safety: Goal: Ability to remain free from injury will improve 11/19/2023 0548 by Court Distance, RN Outcome: Progressing 11/19/2023 0548 by Court Distance, RN Outcome: Progressing   Problem: Skin Integrity: Goal: Risk for impaired skin integrity will decrease 11/19/2023 0548 by Court Distance, RN Outcome: Progressing 11/19/2023 0548 by Court Distance, RN Outcome: Progressing   Problem: Education: Goal: Ability to demonstrate management of disease process will improve 11/19/2023 0548 by Court Distance, RN Outcome: Progressing 11/19/2023 0548 by Court Distance, RN Outcome: Progressing Goal: Ability to verbalize understanding of medication therapies will improve 11/19/2023 0548 by Court Distance, RN Outcome: Progressing 11/19/2023 0548 by Court Distance, RN Outcome: Progressing Goal: Individualized Educational Video(s) 11/19/2023 0548 by Court Distance, RN Outcome: Progressing 11/19/2023 0548 by Court Distance, RN Outcome: Progressing   Problem: Activity: Goal: Capacity to carry out activities will improve 11/19/2023 0548 by Court Distance, RN Outcome: Progressing 11/19/2023 0548 by Court Distance, RN Outcome: Progressing   Problem: Cardiac: Goal: Ability to achieve and maintain adequate cardiopulmonary perfusion will improve 11/19/2023 0548 by Court Distance, RN Outcome: Progressing 11/19/2023 0548 by Court Distance, RN Outcome: Progressing

## 2023-11-20 ENCOUNTER — Other Ambulatory Visit: Payer: Self-pay

## 2023-11-20 ENCOUNTER — Inpatient Hospital Stay (HOSPITAL_COMMUNITY)
Admit: 2023-11-20 | Discharge: 2023-11-20 | Disposition: A | Discharge status: Home / Self Care | Attending: Obstetrics and Gynecology | Admitting: Obstetrics and Gynecology

## 2023-11-20 DIAGNOSIS — I5031 Acute diastolic (congestive) heart failure: Secondary | ICD-10-CM

## 2023-11-20 LAB — ECHOCARDIOGRAM COMPLETE
AR max vel: 3.24 cm2
AV Area VTI: 3.66 cm2
AV Area mean vel: 3.44 cm2
AV Mean grad: 4 mmHg
AV Peak grad: 6.7 mmHg
Ao pk vel: 1.29 m/s
Area-P 1/2: 3.39 cm2
Height: 73.5 in
S' Lateral: 3.4 cm
Weight: 4522.08 [oz_av]

## 2023-11-20 LAB — BASIC METABOLIC PANEL WITH GFR
Anion gap: 8 (ref 5–15)
BUN: 35 mg/dL — ABNORMAL HIGH (ref 8–23)
CO2: 34 mmol/L — ABNORMAL HIGH (ref 22–32)
Calcium: 8.3 mg/dL — ABNORMAL LOW (ref 8.9–10.3)
Chloride: 98 mmol/L (ref 98–111)
Creatinine, Ser: 1.67 mg/dL — ABNORMAL HIGH (ref 0.61–1.24)
GFR, Estimated: 45 mL/min — ABNORMAL LOW (ref >60)
Glucose, Bld: 122 mg/dL — ABNORMAL HIGH (ref 70–99)
Potassium: 4.1 mmol/L (ref 3.5–5.1)
Sodium: 140 mmol/L (ref 135–145)

## 2023-11-20 LAB — GLUCOSE, CAPILLARY
Glucose-Capillary: 136 mg/dL — ABNORMAL HIGH (ref 70–99)
Glucose-Capillary: 171 mg/dL — ABNORMAL HIGH (ref 70–99)

## 2023-11-20 MED ORDER — TORSEMIDE 20 MG PO TABS
20.0000 mg | ORAL_TABLET | Freq: Every day | ORAL | 2 refills | Status: AC
  Filled 2023-11-20: qty 90, 90d supply, fill #0
  Filled 2024-02-11: qty 90, 90d supply, fill #1
  Filled 2024-05-08: qty 90, 90d supply, fill #2

## 2023-11-20 MED ORDER — ATORVASTATIN CALCIUM 40 MG PO TABS
40.0000 mg | ORAL_TABLET | Freq: Every day | ORAL | 3 refills | Status: AC
Start: 2023-11-20 — End: 2024-08-16
  Filled 2023-11-20: qty 90, 90d supply, fill #0
  Filled 2024-02-11: qty 90, 90d supply, fill #1
  Filled 2024-05-08: qty 90, 90d supply, fill #2

## 2023-11-20 MED ORDER — CARVEDILOL 6.25 MG PO TABS
6.2500 mg | ORAL_TABLET | Freq: Two times a day (BID) | ORAL | 1 refills | Status: AC
  Filled 2023-11-20: qty 180, 90d supply, fill #0
  Filled 2024-02-11: qty 180, 90d supply, fill #1

## 2023-11-20 MED ORDER — ISOSORBIDE MONONITRATE ER 30 MG PO TB24
30.0000 mg | ORAL_TABLET | Freq: Every day | ORAL | 2 refills | Status: AC
  Filled 2023-11-20: qty 90, 90d supply, fill #0
  Filled 2024-02-11: qty 90, 90d supply, fill #1
  Filled 2024-05-08: qty 90, 90d supply, fill #2

## 2023-11-20 MED ORDER — HYDRALAZINE HCL 25 MG PO TABS
25.0000 mg | ORAL_TABLET | Freq: Three times a day (TID) | ORAL | 2 refills | Status: AC
  Filled 2023-11-20: qty 90, 30d supply, fill #0
  Filled 2024-02-11: qty 90, 30d supply, fill #1

## 2023-11-20 NOTE — Progress Notes (Signed)
 Heart Failure Stewardship Pharmacy Note  PCP: Rex Castor, MD PCP-Cardiologist: Constancia Delton, MD  HPI: Frederick Perry is a 68 y.o. male with dCHF, PE/DVT (stopped taking Eliquis  in March 2025 due to lack of insurance), hypertension, hyperlipidemia, diabetes mellitus, CAD, CKD-3A, bladder cancer 2017 (s/p incomplete tumor resection), paroxysmal AF, former tobacco use who presented with shortness of breath and lower extremity edema. Patient had stopped the majority of medications due to cost and recently ran out of carvedilol , hydralazine , and torsemide . His late mother had CHF. On admission, BNP was 1184 and A1c pending. Chest x-ray noted peribronchial cuffing and small right pleural effusion.   Pertinent cardiac history: LHC in 08/2015 showed occluded OM2 and PDA, both filled with collaterals, treated with medical management. Echo at that time showed LVEF 50-55%. IVC filter palced 08/2015. Doppler 11/2015 with partially occlusive thrombus of RLE with similar findings in 06/2019. Echo in 08/2019 showed LVEF of 30-35% with global hypokinesis, grade I diastolic dysfunction, and low-normal RV function. LHC in 12/2020 showed severe 3-vessel disease treated with medical management. Echo 12/2020 with LVEF 30-35%. Most recent echo 12/2022 with LVEF uup to 50-55%, grade II diastolic dysfunction, mildly reduced RV function, mild MR and mild late systolic prolapse of posterior leaflet concerning for ruptured chord. Echo this admission showed LVEF of 55-60% with moderate asymmetric LVH and indeterminate diastolic function.   Pertinent Lab Values: Creatinine, Ser  Date Value Ref Range Status  11/20/2023 1.67 (H) 0.61 - 1.24 mg/dL Final   BUN  Date Value Ref Range Status  11/20/2023 35 (H) 8 - 23 mg/dL Final  52/84/1324 12 8 - 27 mg/dL Final   Potassium  Date Value Ref Range Status  11/20/2023 4.1 3.5 - 5.1 mmol/L Final   Sodium  Date Value Ref Range Status  11/20/2023 140 135 - 145  mmol/L Final  02/06/2021 139 134 - 144 mmol/L Final   B Natriuretic Peptide  Date Value Ref Range Status  11/17/2023 1,184.0 (H) 0.0 - 100.0 pg/mL Final    Comment:    Performed at [facility], [address] [place], [place] [address]   Magnesium   Date Value Ref Range Status  11/18/2023 2.6 (H) 1.7 - 2.4 mg/dL Final    Comment:    Performed at [facility], [address] [place], [place] [address]   Hgb A1c MFr Bld  Date Value Ref Range Status  11/19/2023 6.7 (H) 4.8 - 5.6 % Final    Comment:    (NOTE) Diagnosis of Diabetes The following HbA1c ranges recommended by the American Diabetes Association (ADA) may be used as an aid in the diagnosis of diabetes mellitus.  Hemoglobin             Suggested A1C NGSP%              Diagnosis  <5.7                   Non Diabetic  5.7-6.4                Pre-Diabetic  >6.4                   Diabetic  <7.0                   Glycemic control for                       adults with diabetes.     TSH  Date Value Ref Range Status  12/25/2022 1.358 0.350 - 4.500 uIU/mL Final    Comment:    Performed by a 3rd Generation assay with a functional sensitivity of <=0.01 uIU/mL. Performed at [facility], [address] [place], [place] [address]   02/04/2018 2.770 0.450 - 4.500 uIU/mL Final   LDH  Date Value Ref Range Status  09/05/2015 667 (H) 98 - 192 U/L Final   Vital Signs:  Temp:  [97.6 F (36.4 C)-98.5 F (36.9 C)] 98.3 F (36.8 C) (06/06 1216) Pulse Rate:  [68-73] 68 (06/06 1216) Cardiac Rhythm: Normal sinus rhythm (06/06 0829) Resp:  [16-19] 16 (06/06 0336) BP: (101-137)/(51-76) 108/61 (06/06 1216) SpO2:  [93 %-99 %] 96 % (06/06 1216) Weight:  [128.2 kg (282 lb 10.1 oz)] 128.2 kg (282 lb 10.1 oz) (06/06 0500)  Intake/Output Summary (Last 24 hours) at 11/20/2023 1354 Last data filed at 11/20/2023 0300 Gross per 24 hour  Intake 360 ml  Output 2800 ml  Net -2440 ml    Current Heart  Failure Medications:  Loop diuretic: furosemide  80 mg IV BID Beta-Blocker: carvedilol  6.25 mg BID ACEI/ARB/ARNI: none MRA: none SGLT2i: none Other: hydralazine  25 mg TID and isosorbide  ER 30 mg daily  Prior to admission Heart Failure Medications:  Loop diuretic: torsemide  20 mg daily Beta-Blocker: carvedilol  6.25 mg BID ACEI/ARB/ARNI: MRA: SGLT2i: Other: hydralazine  25 mg TID  Assessment: 1. Acute on chronic combined systolic and diastolic heart failure (LVEF improved to 50-55% in 2024 from 30-35%) and grade II diastolic dysfunction, due to ICM. NYHA class III symptoms.  -Symptoms: Patient reports improvement in shortness of breath and orthopnea. Feels at baseline. LEE is improved. Reports appetite is good. On room air during visit this AM. -Volume: Euvolemic today. Mild contraction alkalosis. BUN and creatinine trending up slightly. Agree with stopping IV diuresis. -Hemodynamics: BP elevated on admission, but now controlled with hydralazine . -BB: carvedilol  6.25 mg BID.   -ACEI/ARB/ARNI: Would prefer losartan to BiDil  for added benefit of renal protection in DM, though would prefer to wait until creatinine is trending down. -MRA: Can consider adding spironolactone outpatient. -SGLT2i: Would benefit from SGLT2i, however would require patient assistance at this time due to lack of coverage. -Patient meets criteria for anticoagulation based on CHADSVASc. Will follow plans for anticoagulation.  Plan: 1) Medication changes recommended at this time: -None  2) Patient assistance: -Patient is uninsured. Patient will need patient assistance for Eliquis  and SGLT2i if started.  3) Education: - Patient has been educated on current HF medications and potential additions to HF medication regimen - Patient verbalizes understanding that over the next few months, these medication doses may change and more medications may be added to optimize HF regimen - Patient has been educated on basic  disease state pathophysiology and goals of therapy  Medication Assistance / Insurance Benefits Check: Does the patient have prescription insurance?    Type of insurance plan:  Does the patient qualify for medication assistance through manufacturers or grants? Pending  Eligible grants and/or patient assistance programs: Pending  Medication assistance applications in progress: Pending  Medication assistance applications approved: Pending Approved medication assistance renewals will be completed by: Pending  Outpatient Pharmacy: Prior to admission outpatient pharmacy: [facility]      Please do not hesitate to reach out with questions or concerns,  Jaimy Kliethermes, PharmD, CPP, BCPS Heart Failure Pharmacist  Phone - [phone] 11/20/2023 1:54 PM

## 2023-11-20 NOTE — Plan of Care (Signed)

## 2023-11-20 NOTE — TOC Transition Note (Signed)
 Transition of Care [facility]) - Discharge Note   Patient Details  Name: Frederick Perry MRN: [id number] Date of Birth: Mar 31, 1956  [facility] CM/SW Contact:  Odilia Bennett, LCSW Phone Number: 11/20/2023, 2:49 PM   Clinical Narrative:   Patient has orders to discharge home today. No further concerns. CSW signing off.  Final next level of care: Home/Self Care Barriers to Discharge: Barriers Resolved   Patient Goals and CMS Choice            Discharge Placement                    Patient and family notified of of transfer: 11/20/23  Discharge Plan and Services Additional resources added to the After Visit Summary for     Discharge Planning Services: CM Consult, Medication Assistance                                 Social Drivers of Health (SDOH) Interventions SDOH Screenings   Food Insecurity: Food Insecurity Present (11/19/2023)  Housing: Low Risk  (11/19/2023)  Transportation Needs: Unmet Transportation Needs (11/19/2023)  Utilities: Not At Risk (11/18/2023)  Financial Resource Strain: Medium Risk (11/19/2023)  Physical Activity: Inactive (04/23/2023)   Received from [facility]  Social Connections: Moderately Isolated (11/18/2023)  Stress: No Stress Concern Present (04/23/2023)   Received from [facility]  Tobacco Use: Medium Risk (11/19/2023)  Health Literacy: Adequate Health Literacy (05/22/2023)   Received from [facility]     Readmission Risk Interventions    11/19/2023    8:29 AM  Readmission Risk Prevention Plan  Transportation Screening Complete  PCP or Specialist Appt within 3-5 Days Complete  Social Work Consult for Recovery Care Planning/Counseling Complete  Palliative Care Screening Not Applicable  Medication Review Oceanographer) Referral to Pharmacy

## 2023-11-20 NOTE — Progress Notes (Signed)
*  PRELIMINARY RESULTS* Echocardiogram 2D Echocardiogram has been performed.  Broadus Canes 11/20/2023, 10:59 AM

## 2023-11-20 NOTE — Discharge Summary (Signed)
 Frederick Perry XBJ:[id number] DOB: 1956-04-11 DOA: 11/17/2023  PCP: Rex Castor, MD  Admit date: 11/17/2023 Discharge date: 11/20/2023  Time spent: 35 minutes  Recommendations for Outpatient Follow-up:  Pcp f/u Cardiology and chf clinic f/u     Discharge Diagnoses:  Principal Problem:   Acute on chronic diastolic (congestive) heart failure (HCC) Active Problems:   Coronary artery disease   Essential hypertension   Hyperlipidemia   Type II diabetes mellitus with renal manifestations (HCC)   Chronic kidney disease, stage 3a (HCC)   Pulmonary embolism (HCC)   DVT (deep venous thrombosis) (HCC)   Nicotine  dependence   Bladder cancer (HCC)   Obesity (BMI 30-39.9)   PAF (paroxysmal atrial fibrillation) (HCC)   Tobacco user   Discharge Condition: improved  Diet recommendation: heart healthy  Filed Weights   11/18/23 0826 11/19/23 0500 11/20/23 0500  Weight: 127 kg 130 kg 128.2 kg    History of present illness:  From admission h and p Frederick Perry is a 68 y.o. male with medical history significant of dCHF, PE/DVT (stopped taking Eliquis  in March 2025 due to lack of insurance), hypertension, hyperlipidemia, diabetes mellitus, CAD, CKD-3A, bladder cancer 2017 (s/p of tumor resection), tobacco abuse, polysubstance abuse, who presents with SOB and leg edema.   Patient states that he has shortness of breath in the past 4 days, which has been progressively worsening.  Patient has cough with little mucus production, denies chest pain.  Patient has orthopnea, cannot laying flat for sleeping.  No fever or chills.  He has worsening bilateral leg edema, and the right leg edema is worse than the left.  Patient had 1 episode of loose stool this morning, currently no active nausea, vomiting, diarrhea or abdominal pain.  No symptoms of UTI.  States that he ran out of his Eliquis  in March and torsemide  2 days ago.  Hospital Course:   # HFrecoveredEF Most recent ef 50-55, updated  TTE here unchanged. Here with chf exacerbation with exertional dyspnea, lower extremity edema, orthopnea, secondary to medication noncompliance. Has also not followed up with cardiology as advised. Improved with diuresis - resume home meds (all prescribed) - chf clinic f/u next week, also needs cardiologist f/u   # Acute hypoxic respiratory failure 2/2 above. Resolved with diuresis   # History bladder cancer # Intermittent hematuria Diagnosed 2017, incompletely resected, has not followed up despite documentation of repeated attempts to tell him to do so. Today patient told me he was told he does not have bladder cancer. Given number of times this has been documented I find this hard to believe, and I told him under no uncertain terms that he did have bladder cancer, that it was incompletely resected, that he is overdue for f/u, and that his persistent episodes of hematuria are quite possibly 2/2 ongoing malignancy. I clearly and strongly advised him to follow up with urology.    # History DVT/PE Anticoagulation held in November 2/2 recurrent hematuria with reintroduction of apixaban . Has ivc filter. PVLs here negative for DVT - will hold DOAC pending urology f/u   # Paroxysmal a-fib NSR currently - continue home BB   # T2DM Euglycemic   # CAD No chest pain - cont home asa, statin   Procedures: none   Consultations: none  Discharge Exam: Vitals:   11/20/23 0803 11/20/23 1216  BP: 133/66 108/61  Pulse: 69 68  Resp:    Temp: 98.2 F (36.8 C) 98.3 F (36.8 C)  SpO2: 95% 96%  General exam: Appears calm and comfortable  Respiratory system: rales at bases otherwise clear Cardiovascular system: S1 & S2 heard, RRR.   Gastrointestinal system: Abdomen is obese, soft and nontender.   Central nervous system: Alert and oriented. No focal neurological deficits. Extremities: Symmetric 5 x 5 power. Pitting edema of LEs is much improved Skin: No rashes, lesions or  ulcers Psychiatry: Judgement and insight appear normal. Mood & affect appropriate.   Discharge Instructions   Discharge Instructions     Diet - low sodium heart healthy   Complete by: As directed    Increase activity slowly   Complete by: As directed       Allergies as of 11/20/2023   No Known Allergies      Medication List     STOP taking these medications    apixaban  5 MG Tabs tablet Commonly known as: ELIQUIS    metoprolol  succinate 50 MG 24 hr tablet Commonly known as: TOPROL -XL       TAKE these medications    aspirin  EC 81 MG tablet Take 81 mg by mouth daily.   atorvastatin  40 MG tablet Commonly known as: LIPITOR Take 1 tablet (40 mg total) by mouth daily.   carvedilol  6.25 MG tablet Commonly known as: COREG  Take 1 tablet (6.25 mg total) by mouth 2 (two) times daily.   cetirizine 10 MG tablet Commonly known as: ZYRTEC Take 10 mg by mouth daily as needed for allergies.   hydrALAZINE  25 MG tablet Commonly known as: APRESOLINE  Take 1 tablet (25 mg total) by mouth 3 (three) times daily. Reduced from 37.5 mg.   isosorbide  mononitrate 30 MG 24 hr tablet Commonly known as: IMDUR  Take 1 tablet (30 mg total) by mouth daily.   torsemide  20 MG tablet Commonly known as: DEMADEX  Take 1 tablet (20 mg total) by mouth daily.       No Known Allergies  Follow-up Information     [facility]. Go on 11/26/2023.   Specialty: Cardiology Why: Hospital Follow-Up  11/26/23 @ 3:30 PM Please bring all medications to follow-up appointment [facility] Free Valet Parking @ the [employer] account planner information: [address] [place]  [phone]        Constancia Delton, MD Follow up.   Specialties: Cardiology, Radiology Contact information: [address] [place] [address] [phone]         Matilde Son A, PA-C Follow  up.   Specialty: Urology Contact information: [address] [place] [address] [phone]                  The results of significant diagnostics from this hospitalization (including imaging, microbiology, ancillary and laboratory) are listed below for reference.    Significant Diagnostic Studies: ECHOCARDIOGRAM COMPLETE Result Date: 11/20/2023    ECHOCARDIOGRAM REPORT   Patient Name:   Frederick Perry Date of Exam: 11/20/2023 Medical Rec #:  [id number]         Height:       73.5 in Accession #:    [id number]        Weight:       282.6 lb Date of Birth:  1956/05/30        BSA:          2.505 m Patient Age:    67 years          BP:  133/66 mmHg Patient Gender: M                 HR:           69 bpm. Exam Location:  [facility] Procedure: 2D Echo, Cardiac Doppler and Color Doppler (Both Spectral and Color            Flow Doppler were utilized during procedure). Indications:     CHF-acute diastolic I50.31  History:         Patient has prior history of Echocardiogram examinations, most                  recent 12/23/2022. Previous Myocardial Infarction; Risk                  Factors:Hypertension and Dyslipidemia. Pulmonary embolus.  Sonographer:     Broadus Canes Referring Phys:  [id number] [facility] Kou Gucciardo Diagnosing Phys: Sammy Crisp MD IMPRESSIONS  1. Left ventricular ejection fraction, by estimation, is 55 to 60%. The left ventricle has normal function. Left ventricular endocardial border not optimally defined to evaluate regional wall motion. There is moderate asymmetric left ventricular hypertrophy of the basal-septal segment. Left ventricular diastolic parameters are indeterminate.  2. Right ventricular systolic function is normal. The right ventricular size is normal. Tricuspid regurgitation signal is inadequate for assessing PA pressure.  3. Left atrial size was mildly dilated.  4. The mitral valve is degenerative. Mild mitral valve regurgitation.  5. The aortic valve  is tricuspid. There is mild calcification of the aortic valve. There is mild thickening of the aortic valve. Aortic valve regurgitation is not visualized. Aortic valve sclerosis/calcification is present, without any evidence of aortic stenosis. FINDINGS  Left Ventricle: Left ventricular ejection fraction, by estimation, is 55 to 60%. The left ventricle has normal function. Left ventricular endocardial border not optimally defined to evaluate regional wall motion. The left ventricular internal cavity size was normal in size. There is moderate asymmetric left ventricular hypertrophy of the basal-septal segment. Left ventricular diastolic parameters are indeterminate. Right Ventricle: The right ventricular size is normal. No increase in right ventricular wall thickness. Right ventricular systolic function is normal. Tricuspid regurgitation signal is inadequate for assessing PA pressure. Left Atrium: Left atrial size was mildly dilated. Right Atrium: Right atrial size was normal in size. Pericardium: The pericardium was not well visualized. Mitral Valve: The mitral valve is degenerative in appearance. There is mild thickening of the mitral valve leaflet(s). Mild mitral valve regurgitation. Tricuspid Valve: The tricuspid valve is not well visualized. Tricuspid valve regurgitation is not demonstrated. Aortic Valve: The aortic valve is tricuspid. There is mild calcification of the aortic valve. There is mild thickening of the aortic valve. Aortic valve regurgitation is not visualized. Aortic valve sclerosis/calcification is present, without any evidence of aortic stenosis. Aortic valve mean gradient measures 4.0 mmHg. Aortic valve peak gradient measures 6.7 mmHg. Aortic valve area, by VTI measures 3.66 cm. Pulmonic Valve: The pulmonic valve was grossly normal. Pulmonic valve regurgitation is trivial. No evidence of pulmonic stenosis. Aorta: The aortic root is normal in size and structure. Pulmonary Artery: The pulmonary  artery is not well seen. Venous: The inferior vena cava was not well visualized. IAS/Shunts: The interatrial septum was not well visualized.  LEFT VENTRICLE PLAX 2D LVIDd:         5.40 cm LVIDs:         3.40 cm LV PW:         1.00 cm LV IVS:  1.41 cm LVOT diam:     2.00 cm LV SV:         91 LV SV Index:   36 LVOT Area:     3.14 cm  RIGHT VENTRICLE RV S prime:     13.30 cm/s TAPSE (M-mode): 3.2 cm LEFT ATRIUM              Index        RIGHT ATRIUM           Index LA diam:        5.40 cm  2.16 cm/m   RA Area:     16.60 cm LA Vol (A2C):   120.0 ml 47.91 ml/m  RA Volume:   37.40 ml  14.93 ml/m LA Vol (A4C):   78.9 ml  31.50 ml/m LA Biplane Vol: 99.6 ml  39.76 ml/m  AORTIC VALVE AV Area (Vmax):    3.24 cm AV Area (Vmean):   3.44 cm AV Area (VTI):     3.66 cm AV Vmax:           129.00 cm/s AV Vmean:          87.200 cm/s AV VTI:            0.250 m AV Peak Grad:      6.7 mmHg AV Mean Grad:      4.0 mmHg LVOT Vmax:         133.00 cm/s LVOT Vmean:        95.600 cm/s LVOT VTI:          0.291 m LVOT/AV VTI ratio: 1.16  AORTA Ao Root diam: 2.80 cm MITRAL VALVE MV Area (PHT): 3.39 cm     SHUNTS MV Decel Time: 224 msec     Systemic VTI:  0.29 m MV E velocity: 104.00 cm/s  Systemic Diam: 2.00 cm Sammy Crisp MD Electronically signed by Sammy Crisp MD Signature Date/Time: 11/20/2023/1:32:46 PM    Final    US  Venous Img Lower Bilateral (DVT) Result Date: 11/17/2023 CLINICAL DATA:  Bilateral leg swelling for 4 days. Previous history of DVT. Now with asymmetrical leg edema. EXAM: Bilateral LOWER EXTREMITY VENOUS DOPPLER ULTRASOUND TECHNIQUE: Gray-scale sonography with compression, as well as color and duplex ultrasound, were performed to evaluate the deep venous system(s) from the level of the common femoral vein through the popliteal and proximal calf veins. COMPARISON:  None Available. FINDINGS: VENOUS Normal compressibility of the bilateral common femoral, superficial femoral, and popliteal veins, as well as  the visualized calf veins. Visualized portions of profunda femoral vein and great saphenous vein unremarkable. No filling defects to suggest DVT on grayscale or color Doppler imaging. Doppler waveforms show normal direction of venous flow, normal respiratory plasticity and response to augmentation. OTHER None. Limitations: none IMPRESSION: No evidence of acute deep venous thrombosis in the visualized lower extremity veins. Electronically Signed   By: Boyce Byes M.D.   On: 11/17/2023 21:42   DG Chest 2 View Result Date: 11/17/2023 CLINICAL DATA:  Shortness of breath. EXAM: CHEST - 2 VIEW COMPARISON:  Radiograph 12/22/2022, CT 12/26/2022 FINDINGS: Moderate cardiomegaly. Peribronchial cuffing which may be bronchitic or congestive. Mild flattening of the diaphragms. Suspect small right pleural effusion. No focal airspace disease. No pneumothorax. Mild scoliosis and degenerative change in the spine. IMPRESSION: 1. Moderate cardiomegaly. Peribronchial cuffing which may be bronchitic or congestive. 2. Suspect small right pleural effusion. Electronically Signed   By: Chadwick Colonel M.D.   On: 11/17/2023 17:03  Microbiology: No results found for this or any previous visit (from the past 240 hours).   Labs: Basic Metabolic Panel: Recent Labs  Lab 11/17/23 1509 11/18/23 0511 11/19/23 0252 11/20/23 0503  NA 140 140 143 140  K 4.4 4.1 4.1 4.1  CL 107 104 102 98  CO2 26 28 31  34*  GLUCOSE 142* 178* 104* 122*  BUN 29* 28* 30* 35*  CREATININE 1.58* 1.57* 1.63* 1.67*  CALCIUM  8.3* 8.2* 8.3* 8.3*  MG  --  2.6*  --   --    Liver Function Tests: No results for input(s): "AST", "ALT", "ALKPHOS", "BILITOT", "PROT", "ALBUMIN " in the last 168 hours. No results for input(s): "LIPASE", "AMYLASE" in the last 168 hours. No results for input(s): "AMMONIA" in the last 168 hours. CBC: Recent Labs  Lab 11/17/23 1509 11/18/23 0511  WBC 6.0 5.9  HGB 12.2* 11.5*  HCT 39.0 38.6*  MCV 92.4 95.3  PLT 278  259   Cardiac Enzymes: No results for input(s): "CKTOTAL", "CKMB", "CKMBINDEX", "TROPONINI" in the last 168 hours. BNP: BNP (last 3 results) Recent Labs    12/22/22 1620 11/17/23 1509  BNP 2,212.1* 1,184.0*    ProBNP (last 3 results) No results for input(s): "PROBNP" in the last 8760 hours.  CBG: Recent Labs  Lab 11/19/23 1241 11/19/23 1613 11/19/23 2039 11/20/23 0806 11/20/23 1219  GLUCAP 128* 94 159* 136* 171*       Signed:  Raymonde Calico MD.  [facility] 11/20/2023, 2:48 PM

## 2023-11-26 ENCOUNTER — Encounter: Payer: Self-pay | Admitting: Family

## 2024-02-11 ENCOUNTER — Other Ambulatory Visit: Payer: Self-pay

## 2024-02-12 ENCOUNTER — Other Ambulatory Visit: Payer: Self-pay

## 2024-05-09 ENCOUNTER — Other Ambulatory Visit: Payer: Self-pay
